# Patient Record
Sex: Female | Born: 1950
Health system: Southern US, Community
[De-identification: ages and names within clinical notes are randomized; demographics above are authoritative.]

## PROBLEM LIST (undated history)

## (undated) DIAGNOSIS — M810 Age-related osteoporosis without current pathological fracture: Secondary | ICD-10-CM

## (undated) DIAGNOSIS — F329 Major depressive disorder, single episode, unspecified: Secondary | ICD-10-CM

## (undated) DIAGNOSIS — E039 Hypothyroidism, unspecified: Secondary | ICD-10-CM

## (undated) DIAGNOSIS — K219 Gastro-esophageal reflux disease without esophagitis: Secondary | ICD-10-CM

## (undated) DIAGNOSIS — G20 Parkinson's disease: Secondary | ICD-10-CM

## (undated) DIAGNOSIS — N96 Recurrent pregnancy loss: Secondary | ICD-10-CM

## (undated) DIAGNOSIS — M8580 Other specified disorders of bone density and structure, unspecified site: Secondary | ICD-10-CM

## (undated) DIAGNOSIS — J45909 Unspecified asthma, uncomplicated: Secondary | ICD-10-CM

## (undated) DIAGNOSIS — D649 Anemia, unspecified: Secondary | ICD-10-CM

## (undated) DIAGNOSIS — N200 Calculus of kidney: Secondary | ICD-10-CM

## (undated) DIAGNOSIS — G239 Degenerative disease of basal ganglia, unspecified: Secondary | ICD-10-CM

## (undated) DIAGNOSIS — M1990 Unspecified osteoarthritis, unspecified site: Secondary | ICD-10-CM

## (undated) DIAGNOSIS — J309 Allergic rhinitis, unspecified: Secondary | ICD-10-CM

## (undated) DIAGNOSIS — G20C Parkinsonism, unspecified: Secondary | ICD-10-CM

## (undated) DIAGNOSIS — I10 Essential (primary) hypertension: Secondary | ICD-10-CM

## (undated) DIAGNOSIS — E559 Vitamin D deficiency, unspecified: Secondary | ICD-10-CM

## (undated) DIAGNOSIS — F32A Depression, unspecified: Secondary | ICD-10-CM

## (undated) DIAGNOSIS — M779 Enthesopathy, unspecified: Secondary | ICD-10-CM

## (undated) DIAGNOSIS — I8290 Acute embolism and thrombosis of unspecified vein: Secondary | ICD-10-CM

## (undated) DIAGNOSIS — E785 Hyperlipidemia, unspecified: Secondary | ICD-10-CM

## (undated) HISTORY — DX: Degenerative disease of basal ganglia, unspecified: G23.9

## (undated) HISTORY — PX: KIDNEY STONE SURGERY: SHX686

## (undated) HISTORY — DX: Allergic rhinitis, unspecified: J30.9

## (undated) HISTORY — DX: Vitamin D deficiency, unspecified: E55.9

## (undated) HISTORY — DX: Age-related osteoporosis without current pathological fracture: M81.0

## (undated) HISTORY — DX: Parkinsonism, unspecified: G20.C

## (undated) HISTORY — DX: Hyperlipidemia, unspecified: E78.5

## (undated) HISTORY — DX: Recurrent pregnancy loss: N96

## (undated) HISTORY — DX: Enthesopathy, unspecified: M77.9

## (undated) HISTORY — DX: Major depressive disorder, single episode, unspecified: F32.9

## (undated) HISTORY — PX: BREAST SURGERY: SHX581

## (undated) HISTORY — DX: Calculus of kidney: N20.0

## (undated) HISTORY — DX: Essential (primary) hypertension: I10

## (undated) HISTORY — DX: Other specified disorders of bone density and structure, unspecified site: M85.80

## (undated) HISTORY — PX: BREAST EXCISIONAL BIOPSY: SUR124

## (undated) HISTORY — DX: Depression, unspecified: F32.A

## (undated) HISTORY — DX: Parkinson's disease: G20

---

## 2005-12-24 ENCOUNTER — Ambulatory Visit: Payer: Self-pay | Admitting: Internal Medicine

## 2006-01-20 ENCOUNTER — Ambulatory Visit: Payer: Self-pay | Admitting: Internal Medicine

## 2006-06-25 ENCOUNTER — Inpatient Hospital Stay (HOSPITAL_COMMUNITY): Admission: RE | Admit: 2006-06-25 | Discharge: 2006-06-29 | Payer: Self-pay | Admitting: Orthopedic Surgery

## 2006-12-24 ENCOUNTER — Emergency Department (HOSPITAL_COMMUNITY): Admission: EM | Admit: 2006-12-24 | Discharge: 2006-12-24 | Payer: Self-pay | Admitting: Emergency Medicine

## 2006-12-26 ENCOUNTER — Ambulatory Visit (HOSPITAL_COMMUNITY): Admission: RE | Admit: 2006-12-26 | Discharge: 2006-12-26 | Payer: Self-pay | Admitting: Internal Medicine

## 2007-05-30 HISTORY — PX: JOINT REPLACEMENT: SHX530

## 2008-03-18 ENCOUNTER — Ambulatory Visit: Payer: Self-pay | Admitting: Internal Medicine

## 2008-03-18 DIAGNOSIS — R195 Other fecal abnormalities: Secondary | ICD-10-CM | POA: Insufficient documentation

## 2008-03-18 DIAGNOSIS — K219 Gastro-esophageal reflux disease without esophagitis: Secondary | ICD-10-CM | POA: Insufficient documentation

## 2008-03-18 DIAGNOSIS — R1319 Other dysphagia: Secondary | ICD-10-CM | POA: Insufficient documentation

## 2008-03-18 DIAGNOSIS — K922 Gastrointestinal hemorrhage, unspecified: Secondary | ICD-10-CM | POA: Insufficient documentation

## 2008-04-13 ENCOUNTER — Ambulatory Visit: Payer: Self-pay | Admitting: Internal Medicine

## 2008-04-19 ENCOUNTER — Telehealth: Payer: Self-pay | Admitting: Internal Medicine

## 2008-05-17 ENCOUNTER — Encounter: Admission: RE | Admit: 2008-05-17 | Discharge: 2008-05-17 | Payer: Self-pay | Admitting: Internal Medicine

## 2008-08-31 ENCOUNTER — Other Ambulatory Visit: Admission: RE | Admit: 2008-08-31 | Discharge: 2008-08-31 | Payer: Self-pay | Admitting: Obstetrics & Gynecology

## 2008-09-20 ENCOUNTER — Encounter: Admission: RE | Admit: 2008-09-20 | Discharge: 2008-09-20 | Payer: Self-pay | Admitting: Obstetrics & Gynecology

## 2009-07-29 HISTORY — PX: OTHER SURGICAL HISTORY: SHX169

## 2009-10-06 ENCOUNTER — Emergency Department (HOSPITAL_COMMUNITY): Admission: EM | Admit: 2009-10-06 | Discharge: 2009-10-06 | Payer: Self-pay | Admitting: Emergency Medicine

## 2009-10-07 ENCOUNTER — Inpatient Hospital Stay (HOSPITAL_COMMUNITY): Admission: EM | Admit: 2009-10-07 | Discharge: 2009-10-08 | Payer: Self-pay | Admitting: Emergency Medicine

## 2009-10-24 ENCOUNTER — Emergency Department (HOSPITAL_COMMUNITY): Admission: EM | Admit: 2009-10-24 | Discharge: 2009-10-25 | Payer: Self-pay | Admitting: Emergency Medicine

## 2009-10-24 ENCOUNTER — Ambulatory Visit (HOSPITAL_COMMUNITY): Admission: RE | Admit: 2009-10-24 | Discharge: 2009-10-24 | Payer: Self-pay | Admitting: Urology

## 2010-07-29 HISTORY — PX: OTHER SURGICAL HISTORY: SHX169

## 2010-10-21 LAB — DIFFERENTIAL
Basophils Absolute: 0 10*3/uL (ref 0.0–0.1)
Eosinophils Absolute: 0.2 10*3/uL (ref 0.0–0.7)
Eosinophils Relative: 2 % (ref 0–5)
Lymphocytes Relative: 20 % (ref 12–46)
Lymphs Abs: 1.9 10*3/uL (ref 0.7–4.0)
Neutrophils Relative %: 69 % (ref 43–77)

## 2010-10-21 LAB — URINALYSIS, ROUTINE W REFLEX MICROSCOPIC
Bilirubin Urine: NEGATIVE
Glucose, UA: NEGATIVE mg/dL
Glucose, UA: NEGATIVE mg/dL
Glucose, UA: NEGATIVE mg/dL
Ketones, ur: 15 mg/dL — AB
Ketones, ur: NEGATIVE mg/dL
Nitrite: NEGATIVE
Protein, ur: 30 mg/dL — AB
Protein, ur: NEGATIVE mg/dL
Protein, ur: NEGATIVE mg/dL
Specific Gravity, Urine: 1.021 (ref 1.005–1.030)
Urobilinogen, UA: 0.2 mg/dL (ref 0.0–1.0)
pH: 6 (ref 5.0–8.0)
pH: 7 (ref 5.0–8.0)

## 2010-10-21 LAB — URINE MICROSCOPIC-ADD ON

## 2010-10-21 LAB — CBC
Platelets: 278 10*3/uL (ref 150–400)
RDW: 14.1 % (ref 11.5–15.5)
WBC: 9.5 10*3/uL (ref 4.0–10.5)

## 2010-10-21 LAB — BASIC METABOLIC PANEL
BUN: 12 mg/dL (ref 6–23)
BUN: 17 mg/dL (ref 6–23)
CO2: 26 mEq/L (ref 19–32)
Calcium: 9.2 mg/dL (ref 8.4–10.5)
Calcium: 9.9 mg/dL (ref 8.4–10.5)
Chloride: 103 mEq/L (ref 96–112)
Creatinine, Ser: 0.92 mg/dL (ref 0.4–1.2)
Creatinine, Ser: 1.15 mg/dL (ref 0.4–1.2)
GFR calc Af Amer: 58 mL/min — ABNORMAL LOW (ref >60)
GFR calc non Af Amer: >60 mL/min (ref >60)
Glucose, Bld: 101 mg/dL — ABNORMAL HIGH (ref 70–99)

## 2010-12-11 NOTE — Assessment & Plan Note (Signed)
[place]                                 ON-CALL NOTE   NAME:[name], Rachel LEZON                      MRN:          [id number]  DATE:04/13/2008                            DOB:          November 02, 1950    Rachel [name] is a 60 year old white female who underwent esophageal  dilatation and upper endoscopy by Dr. Henrene Pastor early today.  She is  complaining of severe pain with swallowing, even without swallowing.  She denies shortness of breath or vomiting.  She has not been able to  drink or eat anything because of the pain.  She came home around 11 or  12 o'clock in the morning.  She has never had esophageal dilatation  before.  She denies any fever.   I will review her Electronic Medical Records to get more detail on her  history.  I have advised the patient to go to emergency room at [facility] to obtain chest x-ray.  [facility] and  have asked emergency room physician to make the initial  assessment of the patient and let me known.     Lowella Bandy. Olevia Perches, MD  Electronically Signed    DMB/MedQ  DD: 04/13/2008  DT: 04/14/2008  Job #: [id number]

## 2010-12-14 NOTE — Op Note (Signed)
NAME:  Rachel [name], Rachel [name]               ACCOUNT NO.:  [id number]   MEDICAL RECORD NO.:  [id number]          PATIENT TYPE:  INP   LOCATION:  0010                         FACILITY:  [facility]   PHYSICIAN:  Gaynelle Arabian, M.D.    DATE OF BIRTH:  23-Oct-1950   DATE OF PROCEDURE:  06/25/2006  DATE OF DISCHARGE:                               OPERATIVE REPORT   PREOPERATIVE DIAGNOSIS:  Failed right knee unicompartmental knee  replacement.   POSTOPERATIVE DIAGNOSIS:  Failed right knee unicompartmental knee  replacement.   PROCEDURE:  Revision of right knee unicompartmental replacement to total  knee arthroplasty.   SURGEON:  Gaynelle Arabian, M.D.   ASSISTANT:  Arlee Muslim.   ANESTHESIA:  General with postop Marcaine pain pump.   ESTIMATED BLOOD LOSS:  Minimal.   DRAINS:  Hemovac times one.   TOURNIQUET TIME:  66 minutes at 300 mmHg.   COMPLICATIONS:  None.   CONDITION:  Stable to recovery.   BRIEF CLINICAL NOTE:  Rachel [name] is a 61 year old female who has [place]  [place].  She has had progressively worsening right greater than left  knee pain.  She has evidence of complete collapse of the  unicompartmental replacement on the right with subsidence and settling  of the tibial component.  She is gone on to significant varus on that  side.  She presents now for conversion to total knee arthroplasty.   PROCEDURE IN DETAIL:  After successful administration of general  anesthetic, the tourniquet is placed high on her right thigh and right  lower extremity prepped and draped in the usual sterile fashion.  Extremity was wrapped in Esmarch, knee flexed, tourniquet inflated 300  mmHg.  Midline incision is made with a medial parapatellar extension  distally to incorporate her old incision.  Skin was cut with a 10 blade  through the subcutaneous tissue to the level of the extensor mechanism.  Fresh blade is used to make a medial  parapatellar arthrotomy.  Soft  tissue over the proximal medial tibia is subperiosteally elevated to the  joint line with the knife into the semimembranosus bursa with a Cobb  elevator.  Soft tissue laterally was elevated with attention being paid  to avoid the patellar tendon on tibial tubercle.  Patella subluxed  laterally.  There is evidence of an definitive loosening of the femoral  component.  I easily removed it prying it out with an osteotome.  The  tibial side it had subsided tremendously.  There was a tremendous amount  of synovitis in the joint.  I removed the ACL and PCL.  We then placed a  starting drill into the distal femur and thoroughly irrigated with the  femoral canal.  The 5 degrees right valgus alignment guide is placed and  referencing off the posterior condyles, rotations marked and the block  pinned to remove 10 mm off the distal femur.  Distal femoral resection  is made with an oscillating saw.   Then subluxed the tibia forward and I removed the menisci.  We placed  the extramedullary  tibial alignment guide referencing proximally at the  medial aspect of tibial tubercle and distally along the second  metatarsal axis and tibial crest.  The block was pinned to remove 10 mm  off the non deficient lateral side.  Resection is made an oscillating  saw.  I then easily removed the tibial component and the tibial  component actually had delaminated in addition to subsiding into the  tibia.  I then placed the cutting block for the augments and took an  additional 5 mm off the medial side to get down to normal bone.  We then  placed a starting drill into the tibia and reamed up to 13 mm so we  could placed the MBT revision tray.  Size 3 was the most appropriate and  I  place a trial MBT revision tray with a 5-mm augment medially.  The  sat down beautifully on the cut bone surfaces.  We then prepared  it  with the keel punch and had also prepared with the modular drill.    The femoral sizing block is then placed, size 3 is most appropriate.  The size 3 cutting block is placed so as to create a rectangular flexion  gap with the knee flexed 90 degrees.  This rotation did correspond to  the epicondylar axis.  The anterior, posterior and chamfer cuts were  subsequently made.   The intercondylar cuts made in the femur.  The size 3 posterior  stabilized femoral trial was placed.  We had the size 3 MBT revision  trial with a 5 mm medial augment in place.  A trial 10-mm insert placed.  We had to go to 12.5.  He had excellent stability to full extension,  varus valgus stressing throughout full range of motion.  The patella was  everted and thickness measured to be 23 mm.  Freehand resection taken to  13 mm.  38 template is placed, lug holes were drilled, trial patella is  placed and it tracks normally.  The osteophytes were then removed with  the posterior femur trial in place.  All trials are removed and we  trialed for the cement restrictor and size 6 was most appropriate.  A  size 6 cement restrictor is placed.  All the cut bone surfaces are  subsequently prepared with pulsatile lavage.  Cement was mixed and once  ready for implantation, and the MBT size 3 revision tray with a 5 mm  medial augment and the 30 mm x 13 stem extension is placed.  It is  impacted and all extruded cement removed.  On the femoral side a size 3  posterior stabilized femur was placed.  12.5 mm trial inserts placed.  Knee held in full extension.  All extruded cement removed.  In addition  the 38 patella was cemented into place and held with the patellar clamp.  Once the cement is fully hardened then the permanent 12.5 mm posterior  stabilized rotating platform insert is placed in the tibial tray.  Wound  was copiously irrigated with saline solution and extensor mechanism closed over Hemovac drain with interrupted #1 PDS.  Flexion against  gravity was 120 degrees.  Tourniquet released with  total time of 66  minutes.  Subcu closed interrupted 2-0 Vicryl subcuticular running 4-0  Monocryl.  The catheter for Marcaine pain pump is placed and the pump is  initiated.  Steri-Strips and bulky sterile dressing are applied and she  is subsequently awakened and transported to recovery in stable  condition.      Gaynelle Arabian, M.D.  Electronically Signed     FA/MEDQ  D:  06/25/2006  T:  06/26/2006  Job:  [id number]

## 2010-12-14 NOTE — Discharge Summary (Signed)
NAME:  Rachel [name], Rachel [name]               ACCOUNT NO.:  [id number]   MEDICAL RECORD NO.:  [id number]          PATIENT TYPE:  INP   LOCATION:  1612                         FACILITY:  [facility]   PHYSICIAN:  Gaynelle Arabian, M.D.    DATE OF BIRTH:  08/12/1950   DATE OF ADMISSION:  06/25/2006  DATE OF DISCHARGE:  06/29/2006                               DISCHARGE SUMMARY   ADMITTING DIAGNOSIS:  1. Failed right unicompartmental knee.  2. Failed left unicompartmental knee.  3. Migraines.  4. Mild depression.  5. Asthma.  6. Bronchitis.  7. Reflux disease.  8. Slight urinary incontinence.  9. Hypothyroidism.  10.History of blood clot left ankle.  11.Borderline osteoporosis.  12.Osteoarthritis.  13.Postmenopausal.   DISCHARGE DIAGNOSIS:  1. Failed right unicompartmental knee replacement, status post      revision right unicompartmental knee with conversion over to right      total knee.  2. Acute blood loss anemia.  3. Failed left unicompartmental knee.  4. Migraines.  5. Mild depression.  6. Asthma.  7. Bronchitis.  8. Reflux disease.  9. Slight urinary incontinence.  10.Hypothyroidism.  11.History of blood clot left ankle.  12.Borderline osteoporosis.  13.Osteoarthritis.  14.Postmenopausal.   PROCEDURE:  June 25, 2006 revision right unicompartmental  replacement over to a total knee.   SURGEON:  Gaynelle Arabian, M.D.   ASSISTANT:  Alexzandrew L. Dara Lords, P.A.   ANESTHESIA:  General, postoperative Marcaine pain pump.   TOURNIQUET TIME:  Sixty-six minutes.   CONSULTS:  None.   BRIEF HISTORY:  Ms. Rachel [name] is a 60 year old female who [place]  [place]. She has  had progressive worsening right greater than left knee pain. She has  evidence of collapse in the unicompartmental on the right with  sustenance and setting of the tibial component. She has gone into a  significant varus on that side and now presents to  conversion over.   LABORATORY DATA:  Preop CBC: Hemoglobin was 13.7, hematocrit 40.5, white  cell count 6.5. Postop hemoglobin 11. Last H and H 9.1 and 27.0. PT, PTT  13.9 and 32 respectively on admission, INR 1.1. Serial pro times  followed. Last PT and INR are 23.1 and 2.0. Chem panel on admission all  within normal limits. Preop UA: Small leukocyte esterase, rare bacteria,  rare epithelial cells, otherwise negative, oxalate crystals noted. Blood  group type O+.   EKG June 18, 2006 normal sinus rhythm, septal infarct age  undetermined, abnormal, no previous tracings, confirmed by Dr. Jenkins Rouge. A 2-view chest June 18, 2006: Minimal chronic bronchitic  changes, no acute abnormality. Portable chest June 27, 2006: Stable  changes of COPD, streaky areas of atelectasis but no effusion, edema or  infiltrates.   HISTORY:  The patient was admitted to [facility], tolerated  her procedure well and later transferred to the recovery room and the  orthopedic floor, started on PCA and p.o. analgesics for pain control  following surgery, given Coumadin for DVT prophylaxis, given 24 hours  postop IV antibiotics. Did well on the night of  surgery and also on the  morning of day#1. Started getting up with therapy. Did have a little  over sedation the night of surgery and had to use Narcan. The PCA was  stopped but doing better the next morning. Hemovac drain was pulled.  Started getting up with therapy. Had decent output urinary standpoint.  Started to wean off the O2. By day #2 she started to get up, feeling a  little better. Denied any real significant pain in the knee. Was trying  to wean off her O2. Her saturations kept dropping a little bit. She did  have a history of asthma and bronchitis. Chest x-ray was checked as  above. No labored breathing. Continued to get with therapy. By day #2  she was up ambulating approximately 80 feet and then later 200 feet.  Doing well by  day #2, progressing and by day #3 ambulating over 200  feet, progressing well, was ready to go home by June 29, 2006.   DISCHARGE PLAN:  1. Patient was discharged home on June 29, 2006.  2. Discharge diagnosis: Please see above.  3. Discharge medications: Coumadin, Percocet, Robaxin, Nu-Iron.   DIET:  As tolerated.   ACTIVITY:  Weightbearing as tolerated. Home with PT. Home with nursing.  Total knee protocol. Follow-up 2 weeks.   DISPOSITION:  Home.   CONDITION UPON DISCHARGE:  Improved.      Alexzandrew L. Dara Lords, P.A.      Gaynelle Arabian, M.D.  Electronically Signed    ALP/MEDQ  D:  07/23/2006  T:  07/23/2006  Job:  [id number]   cc:   Dr. Lang Snow

## 2010-12-14 NOTE — H&P (Signed)
NAME:  Rachel [name], Rachel [name]               ACCOUNT NO.:  [id number]   MEDICAL RECORD NO.:  [id number]          PATIENT TYPE:  INP   LOCATION:  NA                           [facility]ACILITY:  [facility]   PHYSICIAN:  Gaynelle Arabian, M.D.    DATE OF BIRTH:  1950-08-29   DATE OF ADMISSION:  06/25/2006  DATE OF DISCHARGE:                              HISTORY & PHYSICAL   DATE OF OFFICE VISIT HISTORY AND PHYSICAL:  June 17, 2006   CHIEF COMPLAINT:  Failed unicompartmental right knee.   HISTORY OF PRESENT ILLNESS:  The patient is a 60 year old female who has  been seen by Dr. Wynelle Link in second opinion for ongoing knee pain.  She  has had bilateral knee replacements for some time now; she underwent  bilateral simultaneous unicompartmental replacements done in [place] in  2002.  She did fairly well for about 6 months, then began to have pain.  She has progressively gotten worse.  She was recently seen by Dr. Nedra Hai, who felt that she would need revision surgery.  She came in  after being referred over by some friends.  She was seen in second  opinion by Dr. Wynelle Link and found to have bilateral unicompartmental knee  replacements.  Unfortunately, she has gone on to significant collapse of  both the femoral and tibial components on the right side and she has  gone into about 5 degrees of varus.  She also has a fair amount of bone  loss.  On the left side, there is some evidence of collapse , but not as  bad.  She has not developed significant angular deformity yet on the  left.  Due to the significant findings, it is felt she would benefit  from undergoing revision surgery, but due to the risks and the increased  surgery, it was felt she would only need to have one at time.  Risks and  benefits of the procedure have been discussed with the patient, who has  elected to proceed with surgery.   ALLERGIES:  No known drug allergies.   CURRENT MEDICATIONS:  Celexa, Vytorin, Singulair, Actonel,  levothyroxine, glucosamine, iron, calcium, vitamin E and a Qvar inhaler.   PAST MEDICAL HISTORY:  1. History of headaches.  2. Mild depression.  3. Asthma.  4. Bronchitis.  5. Reflux disease.  6. Mild urinary incontinence.  7. Hypothyroidism.  8. History of blood clot, left ankle.  9. Borderline osteoporosis.  10.Osteoarthritis.  11.Postmenopausal.   PAST SURGICAL HISTORY:  1. Right knee arthroscopy in 2002.  2. Bilateral unicompartmental replacement surgeries, 2002.   SOCIAL HISTORY:  Married, works as a Research scientist (physical sciences), nonsmoker, no  alcohol, 1 adopted child.   FAMILY HISTORY:  Mother with history of heart disease, diabetes and  arthritis.  Father with a history of stroke and sister with a history of  breast cancer.   REVIEW OF SYSTEMS:  GENERAL:  No fevers, chills or night sweats.  NEUROLOGIC:  No seizures, syncope or paralysis.  RESPIRATORY:  She does  have a little bit of shortness of breath with her asthma, bronchitis and  intermittent cough,  nonproductive.  No hemoptysis.  CARDIOVASCULAR:  No  chest pain, angina or orthopnea.  GI:  No nausea, vomiting, diarrhea or  constipation.  GU:  Some slight urinary incontinence.  No dysuria or  hematuria.  MUSCULOSKELETAL:  Right knee.   PHYSICAL EXAMINATION:  VITAL SIGNS:  Pulse 60, respirations 12, blood  pressure 120/78.  GENERAL:  A 60 year old female, overweight, obese, with hip and thigh  obesity, well-nourished, well-developed, alert, oriented and  cooperative, pleasant.  HEENT:  Normocephalic, atraumatic.  Pupils are round and reactive.  Does  not wear glasses.  EOMs intact.  NECK:  Supple.  LUNGS:  Clear, anterior and posterior chest walls.  HEART:  Regular rate and rhythm.  No murmur.  ABDOMEN:  Soft, round, protuberant abdomen.  Bowel sounds are present.  RECTAL, BREASTS AND GENITALIA:  Not done and not pertinent to present  illness.  EXTREMITIES:  Right knee:  The right knee shows a slight varus  malalignment  deformity, range of motion of 0-130 degrees, no  instability.  Left knee:  Similar motion of 0-130 degrees, no effusion,  no instability.  Crepitus bilaterally noted.   IMPRESSION:  1. Failed right unicompartmental knee.  2. Failed left unicompartmental knee.  3. Migraines.  4. Mild depression.  5. Asthma.  6. Bronchitis.  7. Reflux disease.  8. Slight urinary incontinence.  9. Hypothyroidism.  10.History of a blood clot in left ankle.  11.Borderline osteoporosis.  12.Osteoarthritis.  13.Postmenopausal.   PLAN:  The patient was admitted to [facility] to undergo a  conversion of the right unicompartmental knee over to a right total knee  replacement arthroplasty.  Surgery will be performed by Dr. Gaynelle Arabian.  Her medical physician is Dr. Lang Snow.  Dr. Brigitte Pulse will be  notified of the room number and admission, and will be consulted if  needed for medical assistance with this patient throughout the hospital  course.      Alexzandrew L. Dara Lords, P.A.      Gaynelle Arabian, M.D.  Electronically Signed    ALP/MEDQ  D:  06/24/2006  T:  06/25/2006  Job:  [id number]   cc:   Janalyn Rouse, M.D.  Fax: [phone]

## 2011-08-12 ENCOUNTER — Other Ambulatory Visit (HOSPITAL_COMMUNITY): Payer: Self-pay | Admitting: Internal Medicine

## 2011-08-12 ENCOUNTER — Ambulatory Visit (HOSPITAL_COMMUNITY)
Admission: RE | Admit: 2011-08-12 | Discharge: 2011-08-12 | Disposition: A | Discharge status: Home / Self Care | Payer: BC Managed Care – PPO | Source: Ambulatory Visit | Attending: Internal Medicine | Admitting: Internal Medicine

## 2011-08-12 DIAGNOSIS — N200 Calculus of kidney: Secondary | ICD-10-CM | POA: Insufficient documentation

## 2011-08-12 DIAGNOSIS — N281 Cyst of kidney, acquired: Secondary | ICD-10-CM | POA: Insufficient documentation

## 2011-08-12 DIAGNOSIS — R52 Pain, unspecified: Secondary | ICD-10-CM

## 2011-08-12 DIAGNOSIS — R109 Unspecified abdominal pain: Secondary | ICD-10-CM | POA: Insufficient documentation

## 2011-11-22 ENCOUNTER — Other Ambulatory Visit: Payer: Self-pay | Admitting: Internal Medicine

## 2011-11-22 DIAGNOSIS — Z1231 Encounter for screening mammogram for malignant neoplasm of breast: Secondary | ICD-10-CM

## 2011-12-04 ENCOUNTER — Ambulatory Visit
Admission: RE | Admit: 2011-12-04 | Discharge: 2011-12-04 | Disposition: A | Discharge status: Home / Self Care | Payer: BC Managed Care – PPO | Source: Ambulatory Visit | Attending: Internal Medicine | Admitting: Internal Medicine

## 2011-12-04 DIAGNOSIS — Z1231 Encounter for screening mammogram for malignant neoplasm of breast: Secondary | ICD-10-CM

## 2011-12-04 LAB — HM MAMMOGRAPHY

## 2011-12-10 ENCOUNTER — Other Ambulatory Visit: Payer: Self-pay | Admitting: Orthopedic Surgery

## 2011-12-10 NOTE — Progress Notes (Signed)
Preoperative surgical orders have been place into the Epic hospital system for Yolanda Bonine on 12/10/2011, 5:35 PM  by Mickel Crow for surgery on 06/03/2012.  Preop Total Knee orders including Bupivacaine On-Q pump, IV Tylenol, and IV Decadron as long as there are no contraindications to the above medications.

## 2012-01-03 LAB — HM PAP SMEAR: HM Pap smear: NEGATIVE

## 2012-05-13 ENCOUNTER — Ambulatory Visit (HOSPITAL_COMMUNITY): Payer: BC Managed Care – PPO | Attending: Internal Medicine | Admitting: Radiology

## 2012-05-13 VITALS — BP 153/85 | HR 53 | Ht 66.0 in | Wt 236.0 lb

## 2012-05-13 DIAGNOSIS — I10 Essential (primary) hypertension: Secondary | ICD-10-CM | POA: Insufficient documentation

## 2012-05-13 DIAGNOSIS — R079 Chest pain, unspecified: Secondary | ICD-10-CM

## 2012-05-13 DIAGNOSIS — R0789 Other chest pain: Secondary | ICD-10-CM

## 2012-05-13 DIAGNOSIS — R0602 Shortness of breath: Secondary | ICD-10-CM

## 2012-05-13 MED ORDER — TECHNETIUM TC 99M SESTAMIBI GENERIC - CARDIOLITE
33.0000 | Freq: Once | INTRAVENOUS | Status: AC | PRN
  Administered 2012-05-13: 33 via INTRAVENOUS

## 2012-05-13 MED ORDER — TECHNETIUM TC 99M SESTAMIBI GENERIC - CARDIOLITE
11.0000 | Freq: Once | INTRAVENOUS | Status: AC | PRN
  Administered 2012-05-13: 11 via INTRAVENOUS

## 2012-05-13 MED ORDER — REGADENOSON 0.4 MG/5ML IV SOLN
0.4000 mg | Freq: Once | INTRAVENOUS | Status: AC
  Administered 2012-05-13: 0.4 mg via INTRAVENOUS

## 2012-05-13 NOTE — Progress Notes (Signed)
[facility] [place] [facility] NUCLEAR MED [id number] [place] [address] [phone]  Cardiology Nuclear Med Study  Rachel [name] is a 61 y.o. female     MRN : [id number]     DOB: 1951/07/15  Procedure Date: 05/13/2012  Nuclear Med Background Indication for Stress Test:  Evaluation for Ischemia, Pending Surgical Clearance for (L) TKR and Abnormal EKG on 06/03/12. History:  No previous documented CAD. Cardiac Risk Factors: Hypertension, Lipids and Obesity  Symptoms:  Chest Pain.(b) Arms/Neck (last episode of chest discomfort is now, 1/10.)   Nuclear Pre-Procedure Caffeine/Decaff Intake:  None NPO After: 7:00am   Lungs:  Clear. O2 Sat: 94% on room air. IV 0.9% NS with Angio Cath:  22g  IV Site: R Antecubital  IV Started by:  Crissie Figures, RN  Chest Size (in):  38 Cup Size: B  Height: 5\' 6"  (1.676 m)  Weight:  236 lb (107.049 kg)  BMI:  Body mass index is 38.09 kg/(m^2). Tech Comments:  N/A    Nuclear Med Study 1 or 2 day study: 1 day  Stress Test Type:  Lexiscan  Reading MD: Darlin Coco, MD  Order Authorizing Provider:  Lutricia Feil, MD  Resting Radionuclide: Technetium 56m Sestamibi  Resting Radionuclide Dose: 11.0 mCi   Stress Radionuclide:  Technetium 61m Sestamibi  Stress Radionuclide Dose: 33.0 mCi           Stress Protocol Rest HR: 53 Stress HR: 85  Rest BP: 153/85 Stress BP: 153/91  Exercise Time (min): n/a METS: n/a   Predicted Max HR: 158 bpm % Max HR: 53.8 bpm Rate Pressure Product: 13005   Dose of Adenosine (mg):  n/a Dose of Lexiscan: 0.4 mg  Dose of Atropine (mg): n/a Dose of Dobutamine: n/a mcg/kg/min (at max HR)  Stress Test Technologist: Letta Moynahan, CMA-N  Nuclear Technologist:  Charlton Amor, CNMT     Rest Procedure:  Myocardial perfusion imaging was performed at rest 45 minutes following the intravenous administration of Technetium 68m Sestamibi.  Rest ECG: Probable prior AWMI.  Stress Procedure:  The  patient received IV Lexiscan 0.4 mg over 15-seconds.  Technetium 86m Tetrofosmin injected at 30-seconds.  There were nonspecific T-wave changes and rare PAC's with Lexiscan.  Quantitative spect images were obtained after a 45 minute delay.  Stress ECG: No significant change from baseline ECG  QPS Raw Data Images:  Normal; no motion artifact; normal heart/lung ratio. Stress Images:  Normal homogeneous uptake in all areas of the myocardium. Rest Images:  Normal homogeneous uptake in all areas of the myocardium. Subtraction (SDS):  No evidence of ischemia. Transient Ischemic Dilatation (Normal <1.22):  1.11 Lung/Heart Ratio (Normal <0.45):  0.32  Quantitative Gated Spect Images QGS EDV:  117 ml QGS ESV:  46 ml  Impression Exercise Capacity:  Lexiscan with no exercise. BP Response:  Normal blood pressure response. Clinical Symptoms:  No significant symptoms noted. ECG Impression:  No significant ST segment change suggestive of ischemia. Comparison with Prior Nuclear Study: No previous nuclear study performed  Overall Impression:  Normal stress nuclear study.  LV Ejection Fraction: 61%.  LV Wall Motion:  NL LV Function; NL Wall Motion  [employer]

## 2012-05-26 ENCOUNTER — Other Ambulatory Visit: Payer: Self-pay | Admitting: Orthopedic Surgery

## 2012-05-26 ENCOUNTER — Encounter (HOSPITAL_COMMUNITY): Payer: Self-pay | Admitting: Pharmacy Technician

## 2012-05-26 MED ORDER — BUPIVACAINE 0.25 % ON-Q PUMP SINGLE CATH 300ML: 300.0000 mL | INJECTION | Status: DC

## 2012-05-26 MED ORDER — DEXAMETHASONE SODIUM PHOSPHATE 10 MG/ML IJ SOLN: 10.0000 mg | Freq: Once | INTRAMUSCULAR | Status: DC

## 2012-05-26 NOTE — Progress Notes (Signed)
Preoperative surgical orders have been place into the Epic hospital system for Rachel [name] on 05/26/2012, 5:37 PM  by Mickel Crow for surgery on 06/03/2012.  Preop Total Knee orders including Bupivacaine On-Q pump, IV Tylenol, and IV Decadron as long as there are no contraindications to the above medications. Arlee Muslim, PA-C These orders were originally entered on 12/10/2011 but due to a default 90 day rule, the orders were deleted automatically by the EPIC system requiring them to be reentered again. Arlee Muslim, PA-C

## 2012-05-29 NOTE — Patient Instructions (Addendum)
[place]  05/29/2012   Your procedure is scheduled on:  06-03-2012  Report to [place] at [place] AM    Call this number if you have problems the morning of surgery: [phone]   Remember: bring envelope from dr Wynelle Link   Do not eat food or drink liquids:After Midnight.  .  Take these medicines the morning of surgery with A SIP OF WATER: celexa, prilosec, pulmicort, proairinhaler if needed and bring inhaler.    Do not wear jewelry or make up.  Do not wear lotions, powders, or perfumes. You may wear deodorant.    Do not bring valuables to the hospital.  Contacts, dentures or bridgework may not be worn into surgery.  Leave suitcase in the car. After surgery it may be brought to your room.  For patients admitted to the hospital, checkout time is 11:00 AM the day of discharge                             Patients discharged the day of surgery will not be allowed to drive home. If going home same day of surgery, you must have someone stay with you the first 24 hours at home and arrange for some one to drive you home from hospital.    Special Instructions: See [facility] Preparing for Surgery instruction sheet. Women do not shave legs or underarms for 12 hours before showers. Men may shave face morning of surgery.    Please read over the following fact sheets that you were given: MRSA Information, blood fact sheet  [place] pre op nurse phone number [phone], call if needed

## 2012-05-29 NOTE — Progress Notes (Signed)
STRESS TEST WITH RESTING EKG 05-13-2012 EPIC MEDICAL CLEARN[facility] NOTE DR Brigitte Pulse ON CHART

## 2012-06-01 ENCOUNTER — Encounter (HOSPITAL_COMMUNITY)
Admission: RE | Admit: 2012-06-01 | Discharge: 2012-06-01 | Disposition: A | Discharge status: Home / Self Care | Payer: BC Managed Care – PPO | Source: Ambulatory Visit | Attending: Orthopedic Surgery | Admitting: Orthopedic Surgery

## 2012-06-01 ENCOUNTER — Other Ambulatory Visit: Payer: Self-pay | Admitting: Orthopedic Surgery

## 2012-06-01 ENCOUNTER — Encounter (HOSPITAL_COMMUNITY): Payer: Self-pay

## 2012-06-01 ENCOUNTER — Ambulatory Visit (HOSPITAL_COMMUNITY)
Admission: RE | Admit: 2012-06-01 | Discharge: 2012-06-01 | Disposition: A | Discharge status: Home / Self Care | Payer: BC Managed Care – PPO | Source: Ambulatory Visit | Attending: Orthopedic Surgery | Admitting: Orthopedic Surgery

## 2012-06-01 DIAGNOSIS — Z01818 Encounter for other preprocedural examination: Secondary | ICD-10-CM | POA: Insufficient documentation

## 2012-06-01 HISTORY — DX: Hypothyroidism, unspecified: E03.9

## 2012-06-01 HISTORY — DX: Gastro-esophageal reflux disease without esophagitis: K21.9

## 2012-06-01 HISTORY — DX: Anemia, unspecified: D64.9

## 2012-06-01 HISTORY — DX: Unspecified osteoarthritis, unspecified site: M19.90

## 2012-06-01 HISTORY — DX: Acute embolism and thrombosis of unspecified vein: I82.90

## 2012-06-01 HISTORY — DX: Unspecified asthma, uncomplicated: J45.909

## 2012-06-01 LAB — PROTIME-INR: Prothrombin Time: 14.3 seconds (ref 11.6–15.2)

## 2012-06-01 LAB — URINALYSIS, ROUTINE W REFLEX MICROSCOPIC
Bilirubin Urine: NEGATIVE
Glucose, UA: NEGATIVE mg/dL
Hgb urine dipstick: NEGATIVE
Specific Gravity, Urine: 1.011 (ref 1.005–1.030)
pH: 6.5 (ref 5.0–8.0)

## 2012-06-01 LAB — SURGICAL PCR SCREEN
MRSA, PCR: NEGATIVE
Staphylococcus aureus: NEGATIVE

## 2012-06-01 LAB — COMPREHENSIVE METABOLIC PANEL
Alkaline Phosphatase: 87 U/L (ref 39–117)
BUN: 17 mg/dL (ref 6–23)
CO2: 33 mEq/L — ABNORMAL HIGH (ref 19–32)
Chloride: 100 mEq/L (ref 96–112)
GFR calc Af Amer: >90 mL/min (ref >90)
GFR calc non Af Amer: >90 mL/min (ref >90)
Glucose, Bld: 81 mg/dL (ref 70–99)
Potassium: 3.3 mEq/L — ABNORMAL LOW (ref 3.5–5.1)
Total Bilirubin: 0.5 mg/dL (ref 0.3–1.2)
Total Protein: 6.8 g/dL (ref 6.0–8.3)

## 2012-06-01 LAB — CBC
HCT: 42.2 % (ref 36.0–46.0)
Hemoglobin: 14.2 g/dL (ref 12.0–15.0)
MCHC: 33.6 g/dL (ref 30.0–36.0)

## 2012-06-01 LAB — APTT: aPTT: 34 seconds (ref 24–37)

## 2012-06-01 NOTE — H&P (Signed)
Rachel [name]. [facility]  DOB: 1951/04/28 Married / Language: English / Race: White Female  Date of Admission:  06/03/12  Chief Complaint:  Left Knee Pain  History of Present Illness The patient is a 61 year old female who comes in for a preoperative History and Physical. The patient is scheduled for a left conversion of uni knee to total knee replacement to be performed by Dr. Dione Plover. Aluisio, MD at [facility] on 06/03/2012. The patient is being followed for their left knee pain and osteoarthritis. Symptoms reported today include: pain, swelling and catching. The patient feels that they are doing poorly and report their pain level to be mild to moderate. The following medication has been used for pain control: Hydrocodone (prn). Rachel [name] had a right knee conversion of a uni-knee to total knee about 4-5 years ago and did great with that. The left knee is getting progressively worse. She has a known loose uni-compartment replacement. She feels as though the pain has gotten progressively worse and worse. She wants to get this fixed. They have been treated conservatively in the past for the above stated problem and despite conservative measures, they continue to have progressive pain and severe functional limitations and dysfunction. They have failed non-operative management including home exercise, medications. It is felt that they would benefit from undergoing conversion of uni to total joint replacement. Risks and benefits of the procedure have been discussed with the patient and they elect to proceed with surgery. There are no active contraindications to surgery such as ongoing infection or rapidly progressive neurological disease.  Please note that at the time of her H&P, she described an epsiode of chest pain and discomfort that started on Sunday 05/10/12 after dinner and continued until bedtime. She stated that the pain was in the upper anterior chest and did have radiation of pain into  both the left and right arms and into the neck. The pain became quite severe at one point at which she thought she would have to go to the ED but it did subside eventually. It had resolved by the next morning, but reoccurred once she got out of her shower and continued until about 1 or 2 in the afternoon on Monday. These symtpoms were called over to Dr. Raul Del office and an appointment was arranged over at his office later today, 05/12/12.   Allergies Symbicort   Family History Cancer. sister Cerebrovascular Accident. father Congestive Heart Failure. mother and sister Heart disease in female family member before age 84 Hypertension. mother Osteoarthritis. mother and sister   Social History Alcohol use. current drinker; drinks wine; only occasionally per week current drinker; only occasionally per week Children. 1 Drug/Alcohol Rehab (Currently). no Copy of Drug/Alcohol Rehab (Previously). no Exercise. Exercises daily; does running / walking Illicit drug use. no Living situation. live with spouse Marital status. married Number of flights of stairs before winded. less than 1 Pain Contract. no Tobacco / smoke exposure. yes outdoors only no Tobacco use. never smoker   Medication History CeleXA ( Oral) Specific dose unknown - Active. Naproxen Sodium (500MG  Tablet ER, Oral) Active. Pulmicort Flexhaler (90MCG/ACT Aero Pow Br Act, Inhalation) Active. Synthroid ( Oral) Specific dose unknown - Active. PriLOSEC ( Oral) Specific dose unknown - Active. ProAir HFA (108 (90 Base)MCG/ACT Aerosol Soln, Inhalation) Active. Iron Combinations ( Oral) Active.   Past Surgical History Arthroscopy of Knee. bilateral Total Knee Replacement. right 06/25/2006 by Dr Wynelle Link Kidney Rachel [name] Extraction. Date: 2010. Colonoscopy   Past Medical History Asthma  Blood Clot. Presumed DVT - age 50 - hospitalized for two weeks for treatment with heparin, then coumadin for  several months. Gastroesophageal Reflux Disease Hypercholesterolemia Hypothyroidism Kidney Stone Osteoarthritis Vertigo. Past History   Review of Systems General:Present- Fatigue and Feeling sick. Not Present- Chills, Fever, Night Sweats, Appetite Loss, Weight Gain and Weight Loss. Skin:Not Present- Itching, Rash, Skin Color Changes, Ulcer, Psoriasis and Change in Hair or Nails. HEENT:Present- Hearing problems and Ringing in the Ears. Not Present- Sensitivity to light and Nose Bleed. Respiratory:Present- Snoring and Dyspnea. Not Present- Chronic Cough and Bloody sputum. Cardiovascular:Present- Chest Pain and Swelling of Extremities. Not Present- Shortness of Breath, Leg Cramps and Palpitations. Gastrointestinal:Present- Heartburn. Not Present- Bloody Stool, Abdominal Pain, Vomiting, Nausea and Incontinence of Stool. Female Genitourinary:Not Present- Blood in Urine, Menstrual Irregularities, Frequency, Incontinence and Nocturia. Musculoskeletal:Present- Muscle Weakness, Joint Stiffness, Joint Swelling, Joint Pain and Back Pain. Not Present- Muscle Pain. Neurological:Present- Headaches. Not Present- Tingling, Numbness, Burning, Tremor and Dizziness. Psychiatric:Not Present- Anxiety, Depression and Memory Loss. Endocrine:Present- Heat Intolerance. Not Present- Cold Intolerance, Excessive hunger and Excessive Thirst.   Vitals Weight: 234 lb Height: 65.5 in Body Surface Area: 2.21 m Body Mass Index: 38.35 kg/m Pulse: 62 (Regular) Resp.: 12 (Unlabored) BP: 124/72 (Sitting, Left Arm, Standard)   Physical Exam The physical exam findings are as follows:  Note: Patient is a 61 year old female with continued knee pain. Patient is accompanied today by her husband.   General Mental Status - Alert, cooperative and good historian. General Appearance- pleasant. Not in acute distress. Orientation- Oriented X3. Build & Nutrition- Well nourished and Well  developed.   Head and Neck Head- normocephalic, atraumatic . Neck Global Assessment- supple. no bruit auscultated on the right and no bruit auscultated on the left.   Eye Pupil- Bilateral- Regular and Round. Motion- Bilateral- EOMI.  wears glasses  Chest and Lung Exam Auscultation: Breath sounds:- clear at anterior chest wall and - clear at posterior chest wall. Adventitious sounds:- No Adventitious sounds.  No wheezes noted today  Cardiovascular Auscultation:Rhythm- Regular rate and rhythm. Heart Sounds- S1 WNL and S2 WNL. Murmurs & Other Heart Sounds:Auscultation of the heart reveals - No Murmurs.   Abdomen Inspection:Contour- Generalized moderate distention. Palpation/Percussion:Tenderness- Abdomen is non-tender to palpation. Rigidity (guarding)- Abdomen is soft. Auscultation:Auscultation of the abdomen reveals - Bowel sounds normal.   Female Genitourinary  Not done, not pertinent to present illness  Musculoskeletal Her left knee shows a slight varus. Range is 5-120. There is moderate crepitus on range of motion. She is tender lateral and medial. No instability is noted.  RADIOGRAPHS: AP of both knees and lateral demonstrate the prosthesis on the right is in excellent position. There is no periprosthetic abnormalities. The left shows the uni-compartmental replacement has collapsed and she has tricompartmental bone on bone changes.  Assessment & Plan Osteoarthritis, knee (715.96) Impression: Left Knee Uni  Note: Patient is for a conversion left uni knee to a left total knee replacement by Dr. Wynelle Link.  Plan is to go home.  Please note that at the time of her H&P, she described an epsiode of chest pain and discomfort that started on Sunday 05/10/12 after dinner and continued until bedtime. She stated that the pain was in the upper anterior chest and did have radiation of pain into both the left and right arms and into the neck. The pain  became quite severe at one point at which she thought she would have to go to the ED but it did subside  eventually. It had resolved by the next morning, but reoccurred once she got out of her shower and continued until about 1 or 2 in the afternoon on Monday. These symtpoms were called over to Dr. Raul Del office and an appointment was arranged over at his office later today, 05/12/12.  Signed electronically by Jeneen Montgomery, PA-C

## 2012-06-03 ENCOUNTER — Encounter (HOSPITAL_COMMUNITY)
Admission: RE | Disposition: A | Discharge status: Home Health | Payer: Self-pay | Source: Ambulatory Visit | Attending: Orthopedic Surgery

## 2012-06-03 ENCOUNTER — Inpatient Hospital Stay (HOSPITAL_COMMUNITY): Payer: BC Managed Care – PPO | Admitting: Anesthesiology

## 2012-06-03 ENCOUNTER — Encounter (HOSPITAL_COMMUNITY): Payer: Self-pay | Admitting: Anesthesiology

## 2012-06-03 ENCOUNTER — Encounter (HOSPITAL_COMMUNITY): Payer: Self-pay | Admitting: *Deleted

## 2012-06-03 DIAGNOSIS — Z96659 Presence of unspecified artificial knee joint: Secondary | ICD-10-CM

## 2012-06-03 DIAGNOSIS — T84099A Other mechanical complication of unspecified internal joint prosthesis, initial encounter: Principal | ICD-10-CM | POA: Diagnosis present

## 2012-06-03 DIAGNOSIS — K219 Gastro-esophageal reflux disease without esophagitis: Secondary | ICD-10-CM | POA: Diagnosis present

## 2012-06-03 DIAGNOSIS — Y831 Surgical operation with implant of artificial internal device as the cause of abnormal reaction of the patient, or of later complication, without mention of misadventure at the time of the procedure: Secondary | ICD-10-CM | POA: Diagnosis present

## 2012-06-03 DIAGNOSIS — T8484XA Pain due to internal orthopedic prosthetic devices, implants and grafts, initial encounter: Secondary | ICD-10-CM

## 2012-06-03 DIAGNOSIS — E039 Hypothyroidism, unspecified: Secondary | ICD-10-CM | POA: Diagnosis present

## 2012-06-03 DIAGNOSIS — D62 Acute posthemorrhagic anemia: Secondary | ICD-10-CM

## 2012-06-03 DIAGNOSIS — E876 Hypokalemia: Secondary | ICD-10-CM

## 2012-06-03 HISTORY — PX: TOTAL KNEE REVISION: SHX996

## 2012-06-03 SURGERY — TOTAL KNEE REVISION
Anesthesia: Spinal | Site: Knee | Laterality: Left | Wound class: Clean

## 2012-06-03 MED ORDER — FENTANYL CITRATE 0.05 MG/ML IJ SOLN
INTRAMUSCULAR | Status: DC | PRN
  Administered 2012-06-03: 50 ug via INTRAVENOUS

## 2012-06-03 MED ORDER — OXYCODONE HCL 5 MG PO TABS
5.0000 mg | ORAL_TABLET | ORAL | Status: DC | PRN
  Administered 2012-06-03 – 2012-06-06 (×13): 10 mg via ORAL
  Filled 2012-06-03 (×14): qty 2

## 2012-06-03 MED ORDER — FLEET ENEMA 7-19 GM/118ML RE ENEM: 1.0000 | ENEMA | Freq: Once | RECTAL | Status: AC | PRN

## 2012-06-03 MED ORDER — LACTATED RINGERS IV SOLN: INTRAVENOUS | Status: DC

## 2012-06-03 MED ORDER — LACTATED RINGERS IV SOLN
INTRAVENOUS | Status: DC | PRN
  Administered 2012-06-03 (×3): via INTRAVENOUS

## 2012-06-03 MED ORDER — BUPIVACAINE ON-Q PAIN PUMP (FOR ORDER SET NO CHG)
INJECTION | Status: AC
  Filled 2012-06-03: qty 1

## 2012-06-03 MED ORDER — MEPERIDINE HCL 50 MG/ML IJ SOLN: 6.2500 mg | INTRAMUSCULAR | Status: DC | PRN

## 2012-06-03 MED ORDER — ONDANSETRON HCL 4 MG/2ML IJ SOLN
4.0000 mg | Freq: Four times a day (QID) | INTRAMUSCULAR | Status: DC | PRN
  Administered 2012-06-03 – 2012-06-04 (×2): 4 mg via INTRAVENOUS
  Filled 2012-06-03 (×2): qty 2

## 2012-06-03 MED ORDER — METOCLOPRAMIDE HCL 5 MG/ML IJ SOLN
5.0000 mg | Freq: Three times a day (TID) | INTRAMUSCULAR | Status: DC | PRN
  Administered 2012-06-03: 10 mg via INTRAVENOUS
  Filled 2012-06-03: qty 2

## 2012-06-03 MED ORDER — LEVOTHYROXINE SODIUM 75 MCG PO TABS
75.0000 ug | ORAL_TABLET | Freq: Every evening | ORAL | Status: DC
  Administered 2012-06-04 – 2012-06-05 (×2): 75 ug via ORAL
  Filled 2012-06-03 (×4): qty 1

## 2012-06-03 MED ORDER — PROPOFOL 10 MG/ML IV BOLUS
INTRAVENOUS | Status: DC | PRN
  Administered 2012-06-03 (×2): 20 mg via INTRAVENOUS

## 2012-06-03 MED ORDER — PHENOL 1.4 % MT LIQD: 1.0000 | OROMUCOSAL | Status: DC | PRN

## 2012-06-03 MED ORDER — ACETAMINOPHEN 650 MG RE SUPP: 650.0000 mg | Freq: Four times a day (QID) | RECTAL | Status: DC | PRN

## 2012-06-03 MED ORDER — FLUTICASONE PROPIONATE HFA 44 MCG/ACT IN AERO
2.0000 | INHALATION_SPRAY | Freq: Two times a day (BID) | RESPIRATORY_TRACT | Status: DC
  Administered 2012-06-03 – 2012-06-04 (×2): 2 via RESPIRATORY_TRACT
  Filled 2012-06-03: qty 10.6

## 2012-06-03 MED ORDER — MORPHINE SULFATE 2 MG/ML IJ SOLN
1.0000 mg | INTRAMUSCULAR | Status: DC | PRN
  Administered 2012-06-03 (×3): 1 mg via INTRAVENOUS
  Filled 2012-06-03 (×2): qty 1

## 2012-06-03 MED ORDER — METHOCARBAMOL 100 MG/ML IJ SOLN
500.0000 mg | Freq: Four times a day (QID) | INTRAVENOUS | Status: DC | PRN
  Administered 2012-06-03: 500 mg via INTRAVENOUS
  Filled 2012-06-03: qty 5

## 2012-06-03 MED ORDER — METOCLOPRAMIDE HCL 10 MG PO TABS: 5.0000 mg | ORAL_TABLET | Freq: Three times a day (TID) | ORAL | Status: DC | PRN

## 2012-06-03 MED ORDER — CEFAZOLIN SODIUM-DEXTROSE 2-3 GM-% IV SOLR
2.0000 g | INTRAVENOUS | Status: AC
  Administered 2012-06-03: 2 g via INTRAVENOUS

## 2012-06-03 MED ORDER — ACETAMINOPHEN 10 MG/ML IV SOLN
1000.0000 mg | Freq: Four times a day (QID) | INTRAVENOUS | Status: AC
  Administered 2012-06-03 – 2012-06-04 (×4): 1000 mg via INTRAVENOUS
  Filled 2012-06-03 (×7): qty 100

## 2012-06-03 MED ORDER — PANTOPRAZOLE SODIUM 40 MG PO TBEC
40.0000 mg | DELAYED_RELEASE_TABLET | Freq: Every day | ORAL | Status: DC
  Administered 2012-06-04: 40 mg via ORAL
  Filled 2012-06-03 (×2): qty 1

## 2012-06-03 MED ORDER — ONDANSETRON HCL 4 MG PO TABS: 4.0000 mg | ORAL_TABLET | Freq: Four times a day (QID) | ORAL | Status: DC | PRN

## 2012-06-03 MED ORDER — PROPOFOL INFUSION 10 MG/ML OPTIME
INTRAVENOUS | Status: DC | PRN
  Administered 2012-06-03: 75 ug/kg/min via INTRAVENOUS

## 2012-06-03 MED ORDER — SODIUM CHLORIDE 0.9 % IR SOLN
Status: DC | PRN
  Administered 2012-06-03: 1000 mL

## 2012-06-03 MED ORDER — ACETAMINOPHEN 325 MG PO TABS: 650.0000 mg | ORAL_TABLET | Freq: Four times a day (QID) | ORAL | Status: DC | PRN

## 2012-06-03 MED ORDER — HYDROMORPHONE HCL PF 1 MG/ML IJ SOLN: 0.2500 mg | INTRAMUSCULAR | Status: DC | PRN

## 2012-06-03 MED ORDER — DIPHENHYDRAMINE HCL 12.5 MG/5ML PO ELIX: 12.5000 mg | ORAL_SOLUTION | ORAL | Status: DC | PRN

## 2012-06-03 MED ORDER — DEXAMETHASONE SODIUM PHOSPHATE 10 MG/ML IJ SOLN
10.0000 mg | Freq: Once | INTRAMUSCULAR | Status: AC
  Filled 2012-06-03: qty 1

## 2012-06-03 MED ORDER — SODIUM CHLORIDE 0.9 % IV SOLN: INTRAVENOUS | Status: DC

## 2012-06-03 MED ORDER — METHOCARBAMOL 500 MG PO TABS
500.0000 mg | ORAL_TABLET | Freq: Four times a day (QID) | ORAL | Status: DC | PRN
  Administered 2012-06-04 – 2012-06-06 (×7): 500 mg via ORAL
  Filled 2012-06-03 (×7): qty 1

## 2012-06-03 MED ORDER — DOCUSATE SODIUM 100 MG PO CAPS
100.0000 mg | ORAL_CAPSULE | Freq: Two times a day (BID) | ORAL | Status: DC
  Administered 2012-06-03 – 2012-06-05 (×4): 100 mg via ORAL

## 2012-06-03 MED ORDER — OMEPRAZOLE MAGNESIUM 20 MG PO TBEC: 20.0000 mg | DELAYED_RELEASE_TABLET | Freq: Every day | ORAL | Status: DC

## 2012-06-03 MED ORDER — CITALOPRAM HYDROBROMIDE 20 MG PO TABS
20.0000 mg | ORAL_TABLET | ORAL | Status: DC
  Administered 2012-06-04 – 2012-06-06 (×2): 20 mg via ORAL
  Filled 2012-06-03 (×2): qty 1

## 2012-06-03 MED ORDER — MIDAZOLAM HCL 5 MG/5ML IJ SOLN
INTRAMUSCULAR | Status: DC | PRN
  Administered 2012-06-03: 2 mg via INTRAVENOUS

## 2012-06-03 MED ORDER — RIVAROXABAN 10 MG PO TABS
10.0000 mg | ORAL_TABLET | Freq: Every day | ORAL | Status: DC
  Administered 2012-06-04 – 2012-06-06 (×3): 10 mg via ORAL
  Filled 2012-06-03 (×4): qty 1

## 2012-06-03 MED ORDER — HYDROMORPHONE HCL PF 1 MG/ML IJ SOLN
0.5000 mg | INTRAMUSCULAR | Status: DC | PRN
  Administered 2012-06-03: 0.5 mg via INTRAVENOUS
  Administered 2012-06-03 – 2012-06-04 (×5): 1 mg via INTRAVENOUS
  Filled 2012-06-03 (×6): qty 1

## 2012-06-03 MED ORDER — CEFAZOLIN SODIUM-DEXTROSE 2-3 GM-% IV SOLR
2.0000 g | Freq: Four times a day (QID) | INTRAVENOUS | Status: AC
  Administered 2012-06-03 (×2): 2 g via INTRAVENOUS
  Filled 2012-06-03 (×2): qty 50

## 2012-06-03 MED ORDER — MORPHINE SULFATE 2 MG/ML IJ SOLN
INTRAMUSCULAR | Status: AC
  Administered 2012-06-03: 1 mg via INTRAVENOUS
  Filled 2012-06-03: qty 1

## 2012-06-03 MED ORDER — POLYETHYLENE GLYCOL 3350 17 G PO PACK: 17.0000 g | PACK | Freq: Every day | ORAL | Status: DC | PRN

## 2012-06-03 MED ORDER — MENTHOL 3 MG MT LOZG: 1.0000 | LOZENGE | OROMUCOSAL | Status: DC | PRN

## 2012-06-03 MED ORDER — TRAMADOL HCL 50 MG PO TABS: 50.0000 mg | ORAL_TABLET | Freq: Four times a day (QID) | ORAL | Status: DC | PRN

## 2012-06-03 MED ORDER — DEXAMETHASONE 4 MG PO TABS
10.0000 mg | ORAL_TABLET | Freq: Once | ORAL | Status: AC
  Administered 2012-06-04: 10 mg via ORAL
  Filled 2012-06-03: qty 1

## 2012-06-03 MED ORDER — BUPIVACAINE HCL (PF) 0.75 % IJ SOLN
INTRAMUSCULAR | Status: DC | PRN
  Administered 2012-06-03: 13.5 mg via INTRATHECAL

## 2012-06-03 MED ORDER — 0.9 % SODIUM CHLORIDE (POUR BTL) OPTIME
TOPICAL | Status: DC | PRN
  Administered 2012-06-03: 1000 mL

## 2012-06-03 MED ORDER — BISACODYL 10 MG RE SUPP: 10.0000 mg | Freq: Every day | RECTAL | Status: DC | PRN

## 2012-06-03 MED ORDER — ALBUTEROL SULFATE HFA 108 (90 BASE) MCG/ACT IN AERS: 2.0000 | INHALATION_SPRAY | RESPIRATORY_TRACT | Status: DC | PRN

## 2012-06-03 MED ORDER — KETOROLAC TROMETHAMINE 15 MG/ML IJ SOLN
15.0000 mg | Freq: Once | INTRAMUSCULAR | Status: AC
  Administered 2012-06-03: 15 mg via INTRAVENOUS
  Filled 2012-06-03: qty 1

## 2012-06-03 MED ORDER — ACETAMINOPHEN 10 MG/ML IV SOLN
1000.0000 mg | Freq: Once | INTRAVENOUS | Status: AC
  Administered 2012-06-03: 1000 mg via INTRAVENOUS

## 2012-06-03 MED ORDER — KCL IN DEXTROSE-NACL 20-5-0.45 MEQ/L-%-% IV SOLN
INTRAVENOUS | Status: DC
  Administered 2012-06-03: 75 mL/h via INTRAVENOUS
  Administered 2012-06-04: 21:00:00 via INTRAVENOUS
  Filled 2012-06-03 (×5): qty 1000

## 2012-06-03 MED ORDER — PROMETHAZINE HCL 25 MG/ML IJ SOLN: 6.2500 mg | INTRAMUSCULAR | Status: DC | PRN

## 2012-06-03 MED ORDER — BUPIVACAINE 0.25 % ON-Q PUMP SINGLE CATH 300ML
INJECTION | Status: DC | PRN
  Administered 2012-06-03: 300 mL

## 2012-06-03 SURGICAL SUPPLY — 62 items
BAG ZIPLOCK 12X15 (MISCELLANEOUS) ×2 IMPLANT
BANDAGE ELASTIC 6 VELCRO ST LF (GAUZE/BANDAGES/DRESSINGS) ×2 IMPLANT
BANDAGE ESMARK 6X9 LF (GAUZE/BANDAGES/DRESSINGS) ×1 IMPLANT
BLADE SAG 18X100X1.27 (BLADE) ×2 IMPLANT
BLADE SAW SGTL 11.0X1.19X90.0M (BLADE) ×2 IMPLANT
BNDG ESMARK 6X9 LF (GAUZE/BANDAGES/DRESSINGS) ×2
CAP UPCHARGE REVISION TRAY ×2 IMPLANT
CATH KIT ON-Q SILVERSOAK 5IN (CATHETERS) ×2 IMPLANT
CEMENT HV SMART SET (Cement) ×6 IMPLANT
CLOTH BEACON ORANGE TIMEOUT ST (SAFETY) ×2 IMPLANT
CONT SPECI 4OZ STER CLIK (MISCELLANEOUS) IMPLANT
CUFF TOURN SGL QUICK 34 (TOURNIQUET CUFF)
CUFF TOURN SGL QUICK 44 (TOURNIQUET CUFF) ×2 IMPLANT
CUFF TRNQT CYL 34X4X40X1 (TOURNIQUET CUFF) IMPLANT
DRAPE EXTREMITY T 121X128X90 (DRAPE) ×2 IMPLANT
DRAPE POUCH INSTRU U-SHP 10X18 (DRAPES) ×2 IMPLANT
DRAPE U-SHAPE 47X51 STRL (DRAPES) ×2 IMPLANT
DRSG ADAPTIC 3X8 NADH LF (GAUZE/BANDAGES/DRESSINGS) ×2 IMPLANT
DRSG PAD ABDOMINAL 8X10 ST (GAUZE/BANDAGES/DRESSINGS) ×2 IMPLANT
DURAPREP 26ML APPLICATOR (WOUND CARE) ×2 IMPLANT
ELECT REM PT RETURN 9FT ADLT (ELECTROSURGICAL) ×2
ELECTRODE REM PT RTRN 9FT ADLT (ELECTROSURGICAL) ×1 IMPLANT
EVACUATOR 1/8 PVC DRAIN (DRAIN) ×2 IMPLANT
FACESHIELD LNG OPTICON STERILE (SAFETY) ×10 IMPLANT
GLOVE BIO SURGEON STRL SZ8 (GLOVE) ×2 IMPLANT
GLOVE BIOGEL PI IND STRL 8 (GLOVE) ×2 IMPLANT
GLOVE BIOGEL PI INDICATOR 8 (GLOVE) ×2
GLOVE ECLIPSE 8.0 STRL XLNG CF (GLOVE) ×2 IMPLANT
GLOVE SURG SS PI 6.5 STRL IVOR (GLOVE) ×4 IMPLANT
GOWN STRL NON-REIN LRG LVL3 (GOWN DISPOSABLE) ×4 IMPLANT
GOWN STRL REIN XL XLG (GOWN DISPOSABLE) ×2 IMPLANT
HANDPIECE INTERPULSE COAX TIP (DISPOSABLE) ×1
IMMOBILIZER KNEE 20 (SOFTGOODS) ×2
IMMOBILIZER KNEE 20 THIGH 36 (SOFTGOODS) ×1 IMPLANT
KIT BASIN OR (CUSTOM PROCEDURE TRAY) ×2 IMPLANT
MANIFOLD NEPTUNE II (INSTRUMENTS) ×2 IMPLANT
NS IRRIG 1000ML POUR BTL (IV SOLUTION) ×2 IMPLANT
PACK TOTAL JOINT (CUSTOM PROCEDURE TRAY) ×2 IMPLANT
PAD ABD 7.5X8 STRL (GAUZE/BANDAGES/DRESSINGS) ×2 IMPLANT
PADDING CAST COTTON 6X4 STRL (CAST SUPPLIES) ×2 IMPLANT
PENCIL BUTTON HOLSTER BLD 10FT (ELECTRODE) ×2 IMPLANT
POSITIONER SURGICAL ARM (MISCELLANEOUS) ×2 IMPLANT
RESTRICTOR CEMENT SZ 5 C-STEM (Cement) ×2 IMPLANT
SET HNDPC FAN SPRY TIP SCT (DISPOSABLE) ×1 IMPLANT
SPONGE GAUZE 4X4 12PLY (GAUZE/BANDAGES/DRESSINGS) ×2 IMPLANT
STAPLER VISISTAT 35W (STAPLE) ×2 IMPLANT
STEM TIBIA PFC 13X30MM (Stem) ×2 IMPLANT
STRIP CLOSURE SKIN 1/2X4 (GAUZE/BANDAGES/DRESSINGS) ×2 IMPLANT
SUCTION FRAZIER 12FR DISP (SUCTIONS) ×2 IMPLANT
SUT MNCRL AB 4-0 PS2 18 (SUTURE) ×2 IMPLANT
SUT VIC AB 2-0 CT1 27 (SUTURE) ×3
SUT VIC AB 2-0 CT1 TAPERPNT 27 (SUTURE) ×3 IMPLANT
SUT VLOC 180 0 24IN GS25 (SUTURE) ×2 IMPLANT
SWAB COLLECTION DEVICE MRSA (MISCELLANEOUS) IMPLANT
TOWEL OR 17X26 10 PK STRL BLUE (TOWEL DISPOSABLE) ×4 IMPLANT
TOWER CARTRIDGE SMART MIX (DISPOSABLE) ×2 IMPLANT
TRAY FOLEY CATH 14FRSI W/METER (CATHETERS) ×2 IMPLANT
TRAY SLEEVE CEM ML (Knees) ×2 IMPLANT
TUBE ANAEROBIC SPECIMEN COL (MISCELLANEOUS) IMPLANT
WATER STERILE IRR 1500ML POUR (IV SOLUTION) ×4 IMPLANT
WEDGE SIZE 4 5MM (Knees) ×2 IMPLANT
WRAP KNEE MAXI GEL POST OP (GAUZE/BANDAGES/DRESSINGS) ×2 IMPLANT

## 2012-06-03 NOTE — Brief Op Note (Signed)
06/03/2012  9:48 AM  PATIENT:  Rachel [name]  61 y.o. female  PRE-OPERATIVE DIAGNOSIS:  failed left knee unicompartmental replacement  POST-OPERATIVE DIAGNOSIS:  failed left knee unicompartmental replacement  PROCEDURE:  Procedure(s) (LRB) with comments: TOTAL KNEE REVISION (Left) - Revision of a Left Uni Knee to a Total Knee Arthroplasty  SURGEON:  Surgeon(s) and Role:    * Gearlean Alf, MD - Primary  PHYSICIAN ASSISTANT:   ASSISTANTS: Molli Barrows, PA-C   ANESTHESIA:   spinal  EBL:  Total I/O In: 1000 [I.V.:1000] Out: 250 [Urine:200; Blood:50]  BLOOD ADMINISTERED:none  DRAINS: (Medium) Hemovact drain(s) in the left knee with  Suction Open   LOCAL MEDICATIONS USED:  OTHER On-Q Marcaine pain pump  COUNTS:  YES  TOURNIQUET:   Total Tourniquet Time Documented: Thigh (Left) - 48 minutes  DICTATION: .Other Dictation: Dictation Number [id number]  PLAN OF CARE: Admit to inpatient   PATIENT DISPOSITION:  PACU - hemodynamically stable.

## 2012-06-03 NOTE — Anesthesia Procedure Notes (Signed)
Spinal  Patient location during procedure: OR Staffing Anesthesiologist: Lorenzo Pereyra Performed by: anesthesiologist  Preanesthetic Checklist Completed: patient identified, site marked, surgical consent, pre-op evaluation, timeout performed, IV checked, risks and benefits discussed and monitors and equipment checked Spinal Block Patient position: sitting Prep: Betadine Patient monitoring: heart rate, continuous pulse ox and blood pressure Approach: right paramedian Location: L3-4 Injection technique: single-shot Needle Needle type: Spinocan  Needle gauge: 22 G Needle length: 9 cm Additional Notes Expiration date of kit checked and confirmed. Patient tolerated procedure well, without complications.     

## 2012-06-03 NOTE — Interval H&P Note (Signed)
History and Physical Interval Note:  06/03/2012 8:16 AM  Rachel [name]  has presented today for surgery, with the diagnosis of failed left knee unicompartmental replacement  The various methods of treatment have been discussed with the patient and family. After consideration of risks, benefits and other options for treatment, the patient has consented to  Procedure(s) (LRB) with comments: TOTAL KNEE REVISION (Left) - Revision of a Left Uni Knee to a Total Knee Arthroplasty as a surgical intervention .  The patient's history has been reviewed, patient examined, no change in status, stable for surgery.  I have reviewed the patient's chart and labs.  Questions were answered to the patient's satisfaction.     Gearlean Alf

## 2012-06-03 NOTE — Progress Notes (Signed)
PT NOTE  Pt not medically ready for PT eval on POD0. Will check back on tomorrow. Thanks. Weston Anna, PT [phone]

## 2012-06-03 NOTE — Anesthesia Preprocedure Evaluation (Addendum)
Anesthesia Evaluation  Patient identified by MRN, date of birth, ID band Patient awake    Reviewed: Allergy & Precautions, H&P , NPO status , Patient's Chart, lab work & pertinent test results  Airway Mallampati: II TM Distance: >3 FB Neck ROM: Full    Dental No notable dental hx.    Pulmonary neg pulmonary ROS, asthma ,  breath sounds clear to auscultation  Pulmonary exam normal       Cardiovascular negative cardio ROS  Rhythm:Regular Rate:Normal     Neuro/Psych negative neurological ROS  negative psych ROS   GI/Hepatic negative GI ROS, Neg liver ROS, GERD-  Medicated and Controlled,  Endo/Other  negative endocrine ROSHypothyroidism   Renal/GU negative Renal ROS  negative genitourinary   Musculoskeletal negative musculoskeletal ROS (+)   Abdominal   Peds negative pediatric ROS (+)  Hematology negative hematology ROS (+)   Anesthesia Other Findings   Reproductive/Obstetrics negative OB ROS                          Anesthesia Physical Anesthesia Plan  ASA: II  Anesthesia Plan: Spinal   Post-op Pain Management:    Induction:   Airway Management Planned: Simple Face Mask  Additional Equipment:   Intra-op Plan:   Post-operative Plan: Extubation in OR  Informed Consent: I have reviewed the patients History and Physical, chart, labs and discussed the procedure including the risks, benefits and alternatives for the proposed anesthesia with the patient or authorized representative who has indicated his/her understanding and acceptance.   Dental advisory given  Plan Discussed with: CRNA  Anesthesia Plan Comments:         Anesthesia Quick Evaluation

## 2012-06-03 NOTE — H&P (View-Only) (Signed)
Rachel L. [facility]  DOB: 1950-11-03 Married / Language: English / Race: White Female  Date of Admission:  06/03/12  Chief Complaint:  Left Knee Pain  History of Present Illness The patient is a 61 year old female who comes in for a preoperative History and Physical. The patient is scheduled for a left conversion of uni knee to total knee replacement to be performed by Dr. Dione Plover. Aluisio, MD at [facility] on 06/03/2012. The patient is being followed for their left knee pain and osteoarthritis. Symptoms reported today include: pain, swelling and catching. The patient feels that they are doing poorly and report their pain level to be mild to moderate. The following medication has been used for pain control: Hydrocodone (prn). Caylah had a right knee conversion of a uni-knee to total knee about 4-5 years ago and did great with that. The left knee is getting progressively worse. She has a known loose uni-compartment replacement. She feels as though the pain has gotten progressively worse and worse. She wants to get this fixed. They have been treated conservatively in the past for the above stated problem and despite conservative measures, they continue to have progressive pain and severe functional limitations and dysfunction. They have failed non-operative management including home exercise, medications. It is felt that they would benefit from undergoing conversion of uni to total joint replacement. Risks and benefits of the procedure have been discussed with the patient and they elect to proceed with surgery. There are no active contraindications to surgery such as ongoing infection or rapidly progressive neurological disease.  Please note that at the time of her H&P, she described an epsiode of chest pain and discomfort that started on Sunday 05/10/12 after dinner and continued until bedtime. She stated that the pain was in the upper anterior chest and did have radiation of pain into  both the left and right arms and into the neck. The pain became quite severe at one point at which she thought she would have to go to the ED but it did subside eventually. It had resolved by the next morning, but reoccurred once she got out of her shower and continued until about 1 or 2 in the afternoon on Monday. These symtpoms were called over to Dr. Raul Del office and an appointment was arranged over at his office later today, 05/12/12.   Allergies Symbicort   Family History Cancer. sister Cerebrovascular Accident. father Congestive Heart Failure. mother and sister Heart disease in female family member before age 47 Hypertension. mother Osteoarthritis. mother and sister   Social History Alcohol use. current drinker; drinks wine; only occasionally per week current drinker; only occasionally per week Children. 1 Drug/Alcohol Rehab (Currently). no Copy of Drug/Alcohol Rehab (Previously). no Exercise. Exercises daily; does running / walking Illicit drug use. no Living situation. live with spouse Marital status. married Number of flights of stairs before winded. less than 1 Pain Contract. no Tobacco / smoke exposure. yes outdoors only no Tobacco use. never smoker   Medication History CeleXA ( Oral) Specific dose unknown - Active. Naproxen Sodium (500MG  Tablet ER, Oral) Active. Pulmicort Flexhaler (90MCG/ACT Aero Pow Br Act, Inhalation) Active. Synthroid ( Oral) Specific dose unknown - Active. PriLOSEC ( Oral) Specific dose unknown - Active. ProAir HFA (108 (90 Base)MCG/ACT Aerosol Soln, Inhalation) Active. Iron Combinations ( Oral) Active.   Past Surgical History Arthroscopy of Knee. bilateral Total Knee Replacement. right 06/25/2006 by Dr Wynelle Link Kidney Joaquim Lai Extraction. Date: 2010. Colonoscopy   Past Medical History Asthma  Blood Clot. Presumed DVT - age 89 - hospitalized for two weeks for treatment with heparin, then coumadin for  several months. Gastroesophageal Reflux Disease Hypercholesterolemia Hypothyroidism Kidney Stone Osteoarthritis Vertigo. Past History   Review of Systems General:Present- Fatigue and Feeling sick. Not Present- Chills, Fever, Night Sweats, Appetite Loss, Weight Gain and Weight Loss. Skin:Not Present- Itching, Rash, Skin Color Changes, Ulcer, Psoriasis and Change in Hair or Nails. HEENT:Present- Hearing problems and Ringing in the Ears. Not Present- Sensitivity to light and Nose Bleed. Respiratory:Present- Snoring and Dyspnea. Not Present- Chronic Cough and Bloody sputum. Cardiovascular:Present- Chest Pain and Swelling of Extremities. Not Present- Shortness of Breath, Leg Cramps and Palpitations. Gastrointestinal:Present- Heartburn. Not Present- Bloody Stool, Abdominal Pain, Vomiting, Nausea and Incontinence of Stool. Female Genitourinary:Not Present- Blood in Urine, Menstrual Irregularities, Frequency, Incontinence and Nocturia. Musculoskeletal:Present- Muscle Weakness, Joint Stiffness, Joint Swelling, Joint Pain and Back Pain. Not Present- Muscle Pain. Neurological:Present- Headaches. Not Present- Tingling, Numbness, Burning, Tremor and Dizziness. Psychiatric:Not Present- Anxiety, Depression and Memory Loss. Endocrine:Present- Heat Intolerance. Not Present- Cold Intolerance, Excessive hunger and Excessive Thirst.   Vitals Weight: 234 lb Height: 65.5 in Body Surface Area: 2.21 m Body Mass Index: 38.35 kg/m Pulse: 62 (Regular) Resp.: 12 (Unlabored) BP: 124/72 (Sitting, Left Arm, Standard)   Physical Exam The physical exam findings are as follows:  Note: Patient is a 61 year old female with continued knee pain. Patient is accompanied today by her husband.   General Mental Status - Alert, cooperative and good historian. General Appearance- pleasant. Not in acute distress. Orientation- Oriented X3. Build & Nutrition- Well nourished and Well  developed.   Head and Neck Head- normocephalic, atraumatic . Neck Global Assessment- supple. no bruit auscultated on the right and no bruit auscultated on the left.   Eye Pupil- Bilateral- Regular and Round. Motion- Bilateral- EOMI.  wears glasses  Chest and Lung Exam Auscultation: Breath sounds:- clear at anterior chest wall and - clear at posterior chest wall. Adventitious sounds:- No Adventitious sounds.  No wheezes noted today  Cardiovascular Auscultation:Rhythm- Regular rate and rhythm. Heart Sounds- S1 WNL and S2 WNL. Murmurs & Other Heart Sounds:Auscultation of the heart reveals - No Murmurs.   Abdomen Inspection:Contour- Generalized moderate distention. Palpation/Percussion:Tenderness- Abdomen is non-tender to palpation. Rigidity (guarding)- Abdomen is soft. Auscultation:Auscultation of the abdomen reveals - Bowel sounds normal.   Female Genitourinary  Not done, not pertinent to present illness  Musculoskeletal Her left knee shows a slight varus. Range is 5-120. There is moderate crepitus on range of motion. She is tender lateral and medial. No instability is noted.  RADIOGRAPHS: AP of both knees and lateral demonstrate the prosthesis on the right is in excellent position. There is no periprosthetic abnormalities. The left shows the uni-compartmental replacement has collapsed and she has tricompartmental bone on bone changes.  Assessment & Plan Osteoarthritis, knee (715.96) Impression: Left Knee Uni  Note: Patient is for a conversion left uni knee to a left total knee replacement by Dr. Wynelle Link.  Plan is to go home.  Please note that at the time of her H&P, she described an epsiode of chest pain and discomfort that started on Sunday 05/10/12 after dinner and continued until bedtime. She stated that the pain was in the upper anterior chest and did have radiation of pain into both the left and right arms and into the neck. The pain  became quite severe at one point at which she thought she would have to go to the ED but it did subside  eventually. It had resolved by the next morning, but reoccurred once she got out of her shower and continued until about 1 or 2 in the afternoon on Monday. These symtpoms were called over to Dr. Raul Del office and an appointment was arranged over at his office later today, 05/12/12.  Signed electronically by Jeneen Montgomery, PA-C

## 2012-06-03 NOTE — Transfer of Care (Signed)
Immediate Anesthesia Transfer of Care Note  Patient: Rachel [name]  Procedure(s) Performed: Procedure(s) (LRB): TOTAL KNEE REVISION (Left)  Patient Location: PACU  Anesthesia Type: Spinal  Level of Consciousness: sedated, patient cooperative and responds to stimulaton  Airway & Oxygen Therapy: Patient Spontanous Breathing and Patient connected to face mask oxgen  Post-op Assessment: Report given to PACU RN and Post -op Vital signs reviewed and stable  Post vital signs: Reviewed and stable  Complications: No apparent anesthesia complications [id number] level on exam with no discomfort.

## 2012-06-03 NOTE — Anesthesia Postprocedure Evaluation (Signed)
  Anesthesia Post-op Note  Patient: Rachel [name]  Procedure(s) Performed: Procedure(s) (LRB): TOTAL KNEE REVISION (Left)  Patient Location: PACU  Anesthesia Type: Spinal  Level of Consciousness: awake and alert   Airway and Oxygen Therapy: Patient Spontanous Breathing  Post-op Pain: mild  Post-op Assessment: Post-op Vital signs reviewed, Patient's Cardiovascular Status Stable, Respiratory Function Stable, Patent Airway and No signs of Nausea or vomiting  Post-op Vital Signs: stable  Complications: No apparent anesthesia complications

## 2012-06-04 ENCOUNTER — Encounter (HOSPITAL_COMMUNITY): Payer: Self-pay | Admitting: Orthopedic Surgery

## 2012-06-04 DIAGNOSIS — E876 Hypokalemia: Secondary | ICD-10-CM

## 2012-06-04 DIAGNOSIS — D62 Acute posthemorrhagic anemia: Secondary | ICD-10-CM

## 2012-06-04 LAB — BASIC METABOLIC PANEL
BUN: 12 mg/dL (ref 6–23)
Calcium: 8.3 mg/dL — ABNORMAL LOW (ref 8.4–10.5)
GFR calc non Af Amer: 69 mL/min — ABNORMAL LOW (ref >90)
Glucose, Bld: 155 mg/dL — ABNORMAL HIGH (ref 70–99)

## 2012-06-04 LAB — CBC
HCT: 34.7 % — ABNORMAL LOW (ref 36.0–46.0)
Hemoglobin: 11.4 g/dL — ABNORMAL LOW (ref 12.0–15.0)
MCH: 29.6 pg (ref 26.0–34.0)
MCHC: 32.9 g/dL (ref 30.0–36.0)
MCV: 90.1 fL (ref 78.0–100.0)

## 2012-06-04 MED ORDER — NON FORMULARY: 20.0000 mg | Freq: Every day | Status: DC

## 2012-06-04 MED ORDER — POTASSIUM CHLORIDE CRYS ER 20 MEQ PO TBCR
40.0000 meq | EXTENDED_RELEASE_TABLET | Freq: Two times a day (BID) | ORAL | Status: AC
  Administered 2012-06-04 (×2): 40 meq via ORAL
  Filled 2012-06-04 (×2): qty 2

## 2012-06-04 MED ORDER — OMEPRAZOLE 20 MG PO CPDR
20.0000 mg | DELAYED_RELEASE_CAPSULE | Freq: Every day | ORAL | Status: DC
  Administered 2012-06-05 – 2012-06-06 (×2): 20 mg via ORAL
  Filled 2012-06-04 (×4): qty 1

## 2012-06-04 MED ORDER — BUDESONIDE 90 MCG/ACT IN AEPB
180.0000 ug | INHALATION_SPRAY | Freq: Two times a day (BID) | RESPIRATORY_TRACT | Status: DC
  Administered 2012-06-04 – 2012-06-06 (×4): 2 via RESPIRATORY_TRACT

## 2012-06-04 NOTE — Progress Notes (Signed)
Physical Therapy Treatment Note   06/04/12 1500  PT Visit Information  Last PT Received On 06/04/12  Assistance Needed +1  PT Time Calculation  PT Start Time 1341  PT Stop Time 1409  PT Time Calculation (min) 28 min  Subjective Data  Subjective The pain is much better this afternoon.  Precautions  Precautions Knee  Required Braces or Orthoses Knee Immobilizer - Left  Restrictions  LLE Weight Bearing WBAT  Cognition  Overall Cognitive Status Appears within functional limits for tasks assessed/performed  Bed Mobility  Bed Mobility Supine to Sit;Sit to Supine  Supine to Sit 4: Min assist;HOB elevated  Sit to Supine 4: Min assist;HOB elevated  Details for Bed Mobility Assistance verbal cues for technique, assist for L LE  Transfers  Transfers Stand to Sit;Sit to Stand  Sit to Stand 4: Min assist;With upper extremity assist;From bed  Stand to Sit 4: Min assist;With upper extremity assist;To bed  Details for Transfer Assistance assist to rise and control descent, verbal cues for safe technique including hand placement and L LE forward  Ambulation/Gait  Ambulation/Gait Assistance 4: Min assist  Ambulation Distance (Feet) 40 Feet  Assistive device Rolling walker  Ambulation/Gait Assistance Details verbal cues for sequence, RW distance, step length, posture  Gait Pattern Step-to pattern;Decreased stance time - left;Antalgic  Gait velocity decreased  Exercises  Exercises Total Joint  Total Joint Exercises  Ankle Circles/Pumps AROM;Both;15 reps  Quad Sets AROM;Both;15 reps  Short Arc Quad AROM;Left;15 reps  Heel Slides AAROM;Left;15 reps  Hip ABduction/ADduction AROM;Left;15 reps  Straight Leg Raises AAROM;Left;15 reps  Goniometric ROM L knee AAROM -8-40* limited by pain  PT - End of Session  Equipment Utilized During Treatment Left knee immobilizer  Activity Tolerance Patient limited by fatigue;Patient limited by pain  Patient left in bed;with call bell/phone within reach;with  nursing in room  PT - Assessment/Plan  Comments on Treatment Session Pt performed exercises and ambulated in hallway.  Pt reports she feels much better this afternoon since pain is under control.  PT Plan Discharge plan remains appropriate;Frequency remains appropriate  Follow Up Recommendations Home health PT  Equipment Recommended 3 in 1 bedside comode  Acute Rehab PT Goals  PT Goal: Supine/Side to Sit - Progress Progressing toward goal  PT Goal: Sit to Supine/Side - Progress Progressing toward goal  PT Goal: Sit to Stand - Progress Progressing toward goal  PT Goal: Stand to Sit - Progress Progressing toward goal  PT Goal: Ambulate - Progress Progressing toward goal  PT Goal: Perform Home Exercise Program - Progress Progressing toward goal  PT General Charges  $$ ACUTE PT VISIT 1 Procedure  PT Treatments  $Gait Training 8-22 mins  $Therapeutic Exercise 8-22 mins    Pain: premedicated, 5/10 L knee pain, repositioned  Carmelia Bake, PT Pager: [phone]

## 2012-06-04 NOTE — Progress Notes (Signed)
   Subjective: 1 Day Post-Op Procedure(s) (LRB): CONVERSION OF UNI TO TOTAL KNEE REVISION (Left) Patient reports pain as mild and moderate.   Patient seen in rounds with Dr. Wynelle Link. Patient is well, but has had some minor complaints of pain in the knee, requiring pain medications We will start therapy today.  Plan is to go Home after hospital stay.  Objective: Vital signs in last 24 hours: Temp:  [96.8 F (36 C)-98.1 F (36.7 C)] 98.1 F (36.7 C) (11/07 0521) Pulse Rate:  [60-79] 79  (11/07 0521) Resp:  [12-16] 14  (11/07 0521) BP: (101-172)/(64-95) 101/68 mmHg (11/07 0521) SpO2:  [97 %-100 %] 97 % (11/07 0521) Weight:  [105.688 kg (233 lb)] 105.688 kg (233 lb) (11/06 1121)  Intake/Output from previous day:  Intake/Output Summary (Last 24 hours) at 06/04/12 0931 Last data filed at 06/04/12 0521  Gross per 24 hour  Intake 3172.5 ml  Output   1660 ml  Net 1512.5 ml    Intake/Output this shift: UOP 200 since MN Positive volume - Diurese  Labs:  [facility] 06/04/12 0420 06/01/12 1400  HGB 11.4* 14.2    Basename 06/04/12 0420 06/01/12 1400  WBC 9.0 6.8  RBC 3.85* 4.78  HCT 34.7* 42.2  PLT 251 310    Basename 06/04/12 0420 06/01/12 1400  NA 136 141  K 3.4* 3.3*  CL 97 100  CO2 33* 33*  BUN 12 17  CREATININE 0.88 0.71  GLUCOSE 155* 81  CALCIUM 8.3* 9.4    Basename 06/01/12 1400  LABPT --  INR 1.13    EXAM General - Patient is Alert, Appropriate and Oriented Extremity - Neurovascular intact Sensation intact distally Dorsiflexion/Plantar flexion intact No cellulitis present Dressing - dressing C/D/I and moderate drainage.  Dressing changed.  Incision with no active bleeding. Motor Function - intact, moving foot and toes well on exam.  Hemovac pulled without difficulty.  Past Medical History  Diagnosis Date  . Hypothyroidism   . Asthma   . Chronic kidney disease     hx of kidney stones, last 6 months ago  . GERD (gastroesophageal reflux disease)     . Arthritis   . Blood clot in vein  age 72    left leg  . Anemia     Assessment/Plan: 1 Day Post-Op Procedure(s) (LRB): TOTAL KNEE REVISION (Left) Principal Problem:  *Pain due to unicompartmental arthroplasty of knee Active Problems:  Postop Acute blood loss anemia  Postop Hypokalemia  Estimated Body mass index is 37.61 kg/(m^2) as calculated from the following:   Height as of this encounter: 5\' 6" (1.676 m).   Weight as of this encounter: 233 lb(105.688 kg). Advance diet Up with therapy Discharge home with home health  DVT Prophylaxis - Xarelto Weight-Bearing as tolerated to left leg No vaccines. D/C O2 and Pulse OX and try on Room [address]  Mickel Crow 06/04/2012, 9:31 AM

## 2012-06-04 NOTE — Progress Notes (Signed)
Physical Therapy Treatment Note   06/04/12 1200  PT Visit Information  Last PT Received On 06/04/12  Assistance Needed +2  PT Time Calculation  PT Start Time 1125  PT Stop Time 1134  PT Time Calculation (min) 9 min  Subjective Data  Subjective It's hurting real bad.  (L knee)  Precautions  Precautions Knee  Required Braces or Orthoses Knee Immobilizer - Left  Restrictions  LLE Weight Bearing WBAT  Cognition  Overall Cognitive Status Appears within functional limits for tasks assessed/performed  Bed Mobility  Bed Mobility Sit to Supine  Sit to Supine 4: Min assist;HOB elevated  Details for Bed Mobility Assistance verbal cues for technique, assist for L LE  Transfers  Transfers Stand to Sit;Sit to Stand;Stand Pivot Transfers  Sit to Stand 4: Min assist;With upper extremity assist;From chair/3-in-1;With armrests  Stand to Sit 4: Min assist;With upper extremity assist;To bed  Stand Pivot Transfers 4: Min assist  Details for Transfer Assistance assist to rise and control descent, verbal cues for hand placement and L LE forward, verbal cues for taking a few steps from recliner to bed  PT - End of Session  Equipment Utilized During Treatment Left knee immobilizer  Activity Tolerance Patient limited by pain  Patient left in bed;with call bell/phone within reach;with family/visitor present  Nurse Communication Patient requests pain meds  PT - Assessment/Plan  Comments on Treatment Session Pt requested assist back to bed.  Currently in too much pain for exercises so will attempt to return.  RN notified of pain and ice applied to knee.  PT Plan Discharge plan remains appropriate;Frequency remains appropriate  Follow Up Recommendations Home health PT  Equipment Recommended 3 in 1 bedside comode  Acute Rehab PT Goals  PT Goal: Sit to Supine/Side - Progress Progressing toward goal  PT Goal: Sit to Stand - Progress Progressing toward goal  PT Goal: Stand to Sit - Progress Progressing  toward goal  PT General Charges  $$ ACUTE PT VISIT 1 Procedure  PT Treatments  $Therapeutic Activity 8-22 mins    Carmelia Bake, PT Pager: [phone]

## 2012-06-04 NOTE — Progress Notes (Signed)
Brief pharmacy Note Re: Xarelto dose timing, SCIP  S/O Pt on Xarelto per MD for VTE prophylaxis after TKA, CrCL>30, DIs: none significant. First dose due at 0800 today. AET = yesterday @ 1018.   A/P To ensure SCIP criteria met, I spoke with RN Wendie Chess) and reminded her of the dose being due. I handed her the dose @ 0840. Of note, pt has SCD ordered and charted (since pt is s/p TKA, SCD will satisfy SCIP criteria).  Marcell Anger PharmD  [phone] 06/04/2012 8:40 AM

## 2012-06-04 NOTE — Progress Notes (Signed)
Utilization review completed.  

## 2012-06-04 NOTE — Op Note (Signed)
NAMEJIMESHA, Rachel [name]               ACCOUNT NO.:  [id number]  MEDICAL RECORD NO.:  [id number]  LOCATION:  [id number]                         FACILITY:  [facility]  PHYSICIAN:  Gaynelle Arabian, M.D.    DATE OF BIRTH:  [birth year]  DATE OF PROCEDURE:  06/03/2012 DATE OF DISCHARGE:                              OPERATIVE REPORT   PREOPERATIVE DIAGNOSIS:  Failed left knee unicompartmental replacement.  POSTOPERATIVE DIAGNOSIS:  Failed left knee unicompartmental replacement.  PROCEDURE:  Revision of left unicompartmental knee replacement to total knee arthroplasty.  SURGEON:  Gaynelle Arabian, M.D.  ASSISTANT:  Judith Part. Chabon, PA-C  ANESTHESIA:  Spinal.  ESTIMATED BLOOD LOSS:  Minimal.  DRAINS:  Hemovac x1.  COMPLICATIONS:  None.  CONDITION:  Stable to recovery.  TOURNIQUET TIME:  47 minutes at 300 mmHg.  BRIEF CLINICAL NOTE:  Rachel [name] is a 61 year old female who had unicompartmental knee replacement done many years ago.  She has gone on to develop arthritis in the other compartments of the knee with significant pain and dysfunction.  I had revised her unicompartmental to the total knee arthroplasty on the right side with good result.  She presents now for revision of unicompartmental replacement to total knee arthroplasty on the left.  PROCEDURE IN DETAIL:  After successful administration of spinal anesthetic, a tourniquet was placed high on her left thigh and left lower extremity, it was prepped and draped in the usual sterile fashion. Extremity was wrapped in Esmarch, knee flexed, tourniquet inflated to 300 mmHg.  A midline incision was made with 10-blade through subcutaneous tissue to the level of the extensor mechanism.  A fresh blade was used to make a medial parapatellar arthrotomy.  Soft tissue on the proximal medial tibia was subperiosteally elevated to the joint line with a knife and into the semimembranosus bursa with a Cobb elevator. Soft tissue laterally was elevated with  attention being paid to avoiding the patellar tendon on tibial tubercle.  Patella was everted, knee flexed to 90 degrees and PCL removed.  ACL was already gone.  I then used an osteotome to disrupt the interface between the medial component on the femur and the bone.  This was easily removed without difficulty consistent with loosening of the femoral component.  Drill was then used to irrigate a starting hole in the distal femur, and the canal was thoroughly irrigated.  A 5-degree left valgus alignment guide was placed and the block was pinned to remove 10 mm off the distal femur.  This got back beyond the defect created by removal of the component.  Resection was made with an oscillating saw.  The tibia was subluxed forward into the lateral meniscus removed. Extramedullary tibial alignment guide was placed, referencing proximally at the medial aspect of the tibial tubercle and distally along the second metatarsal axis and tibial crest.  Pins were removed 10 mm from the non-deficient lateral side.  Tibial resection was made with an oscillating saw.  I was easily able to remove the polyethylene tibial component.  I templated to remove an additional 5 mm off of the medial side to get underneath the cement mantle from that component.  This was subsequently made with an oscillating  saw.  Size 4 was the most appropriate tibial component.  Proximal tibia was prepared to modular drill and then the drill with a 13 x 30 stem extension to gain access to the tibial canal.  The keel punch was then placed.  The size for the tibial component was a 4 MBT revision tray with a 13 x 30 stem extension and a 5-mm medial augment.  I also prepared for 29-mm sleeve and placed that on a trial.  The trial tibial component was placed with excellent fit.  The femoral sizing guide was placed, size 4 was most appropriate. Rotation was marked at the epicondylar axis and confirmed by creating rectangular flexion gap  at 90 degrees.  Block was pinned in its position, and anterior, posterior and chamfer cuts were made. Intercondylar blocks were placed and neck cuts were made.  Trial size 4 posterior stabilized femur was placed.  A 12.5-mm posterior stabilized rotating platform insert trial was placed.  There was a tiny bit of hyperextension that was went to 15, which allowed for full extension with excellent varus-valgus and anterior-posterior balance throughout full range of motion.  Patella was everted, thickness measured to be 27 mm.  Freehand resection was taken to 15 mm, 38 template was placed, lug holes were drilled, trial patella was placed and it tracks normally. Osteophytes were removed off the posterior femur with the trial in place.  All trials were removed and the cut bone surface was repaired with pulsatile lavage.  I sized the tibial canal for cement restrictor, size 5 was most appropriate, and the size 5 restrictor was placed at the appropriate depth in the tibial canal.  Further irrigation was performed.  Cement was mixed and once ready for implantation, injected into the tibial canal and then placed on the cut bone surface.  Tibial component was impacted.  Once again, size 4 MBT revision tray, 29 sleeve, 5-mm medial augment, 13 x 30 stem extension.  It was impacted with excellent fit.  Femoral components were cemented into place and impacted.  A 15-mm trial insert was placed and the knee held in full extension.  All extruded cement were removed.  Patella was cemented into place, which was a size 38 and held with the patellar clamp.  Once the cement was fully hardened, then the permanent 15-mm posterior stabilized rotating platform insert was placed in the tibial tray.  Wound was copiously irrigated with saline solution, and arthrotomy was closed over a Hemovac drain with a running #1 V-Loc suture.  Tourniquet was released.  Total tourniquet time of 47 minutes.  Subcu was closed  with interrupted 2-0 Vicryl and subcuticular running 4-0 Monocryl.  Catheter for the Marcaine.  Pain pumps were placed and pumps initiated.  Incision was cleaned and dried, and Steri-Strips and a bulky sterile dressing applied.  She was placed into a knee immobilizer, awakened and transported to the recovery in stable condition.     Gaynelle Arabian, M.D.     FA/MEDQ  D:  06/03/2012  T:  06/04/2012  Job:  [id number]

## 2012-06-04 NOTE — Evaluation (Signed)
Physical Therapy Evaluation Patient Details Name: Rachel [name] MRN: [id number] DOB: 05/23/1951 Today's Date: 06/04/2012 Time: [id number] PT Time Calculation (min): 20 min  PT Assessment / Plan / Recommendation Clinical Impression  Pt s/p L unicompartmental knee revision to TKR.  Pt would benefit from acute PT services in order to improve independence with transfers, ambulation and step to return home with spouse.    PT Assessment  Patient needs continued PT services    Follow Up Recommendations  Home health PT    Does the patient have the potential to tolerate intense rehabilitation      Barriers to Discharge        Equipment Recommendations  3 in 1 bedside comode    Recommendations for Other Services     Frequency 7X/week    Precautions / Restrictions Precautions Precautions: Knee Required Braces or Orthoses: Knee Immobilizer - Left Restrictions LLE Weight Bearing: Weight bearing as tolerated   Pertinent Vitals/Pain 6/10 L knee, repositioned, RN aware      Mobility  Bed Mobility Bed Mobility: Supine to Sit Supine to Sit: HOB elevated;4: Min assist;With rails Details for Bed Mobility Assistance: verbal cues for technique, assist for L LE Transfers Transfers: Stand to Sit;Sit to Stand Sit to Stand: 4: Min assist;With upper extremity assist;From bed Stand to Sit: 4: Min assist;With upper extremity assist;To chair/3-in-1 Details for Transfer Assistance: assist to rise and control descent, verbal cues for hand placement and L LE forward Ambulation/Gait Ambulation/Gait Assistance: 4: Min assist Ambulation Distance (Feet): 8 Feet Assistive device: Rolling walker Ambulation/Gait Assistance Details: verbal cues for sequence, pt reported feeling dizzy and then became nauseous so brought recliner behind pt, sat EOC for a couple minutes until able to scoot back in chair and recline LEs Gait Pattern: Step-to pattern;Decreased stance time - left;Antalgic    Shoulder  Instructions     Exercises     PT Diagnosis: Difficulty walking;Acute pain  PT Problem List: Decreased strength;Decreased range of motion;Decreased mobility;Pain;Obesity PT Treatment Interventions: DME instruction;Gait training;Stair training;Patient/family education;Functional mobility training;Therapeutic exercise;Therapeutic activities   PT Goals Acute Rehab PT Goals PT Goal Formulation: With patient Time For Goal Achievement: 06/11/12 Potential to Achieve Goals: Good Pt will go Supine/Side to Sit: with modified independence PT Goal: Supine/Side to Sit - Progress: Goal set today Pt will go Sit to Supine/Side: with modified independence PT Goal: Sit to Supine/Side - Progress: Goal set today Pt will go Sit to Stand: with modified independence PT Goal: Sit to Stand - Progress: Goal set today Pt will go Stand to Sit: with modified independence PT Goal: Stand to Sit - Progress: Goal set today Pt will Ambulate: 51 - 150 feet;with modified independence;with least restrictive assistive device PT Goal: Ambulate - Progress: Goal set today Pt will Go Up / Down Stairs: 1-2 stairs;with supervision;with least restrictive assistive device PT Goal: Up/Down Stairs - Progress: Goal set today Pt will Perform Home Exercise Program: with supervision, verbal cues required/provided PT Goal: Perform Home Exercise Program - Progress: Goal set today  Visit Information  Last PT Received On: 06/04/12 Assistance Needed: +2    Subjective Data  Subjective: This is my 4th knee surgery.   Prior Functioning  Home Living Lives With: Spouse Type of Home: House Home Access: Stairs to enter Technical brewer of Steps: 1 Entrance Stairs-Rails: None Home Layout: One level Home Adaptive Equipment: Walker - rolling Prior Function Level of Independence: Independent with assistive device(s) Communication Communication: No difficulties    Cognition  Overall Cognitive Status:  Appears within functional  limits for tasks assessed/performed Arousal/Alertness: Awake/alert Orientation Level: Appears intact for tasks assessed Behavior During Session: [facility] for tasks performed    Extremity/Trunk Assessment Right Lower Extremity Assessment RLE ROM/Strength/Tone: [facility] for tasks assessed Left Lower Extremity Assessment LLE ROM/Strength/Tone: Deficits LLE ROM/Strength/Tone Deficits: unable to lift against gravity requiring assist, did not assess flexion due to pain, maintained KI   Balance    End of Session PT - End of Session Equipment Utilized During Treatment: Left knee immobilizer Activity Tolerance: Other (comment) (limited by dizziness, nausea) Patient left: in chair;with call bell/phone within reach;with nursing in room Nurse Communication: Other (comment) (nausea and RN in to check on pt)  GP     Beyza Bellino,KATHrine E 06/04/2012, 10:28 AM Pager: [phone]

## 2012-06-05 HISTORY — PX: REPLACEMENT TOTAL KNEE: SUR1224

## 2012-06-05 LAB — CBC
HCT: 30.4 % — ABNORMAL LOW (ref 36.0–46.0)
MCHC: 32.9 g/dL (ref 30.0–36.0)
MCV: 89.4 fL (ref 78.0–100.0)
RDW: 12.9 % (ref 11.5–15.5)
WBC: 11.7 10*3/uL — ABNORMAL HIGH (ref 4.0–10.5)

## 2012-06-05 LAB — BASIC METABOLIC PANEL
BUN: 12 mg/dL (ref 6–23)
Chloride: 100 mEq/L (ref 96–112)
Creatinine, Ser: 0.82 mg/dL (ref 0.50–1.10)
GFR calc Af Amer: 87 mL/min — ABNORMAL LOW (ref >90)

## 2012-06-05 MED ORDER — METHOCARBAMOL 500 MG PO TABS: 500.0000 mg | ORAL_TABLET | Freq: Four times a day (QID) | ORAL | Status: DC | PRN

## 2012-06-05 MED ORDER — RIVAROXABAN 10 MG PO TABS: 10.0000 mg | ORAL_TABLET | Freq: Every day | ORAL | Status: DC

## 2012-06-05 MED ORDER — OXYCODONE HCL 5 MG PO TABS: 5.0000 mg | ORAL_TABLET | ORAL | Status: DC | PRN

## 2012-06-05 NOTE — Progress Notes (Signed)
   Subjective: 2 Days Post-Op Procedure(s) (LRB): CONVERSION OF UNI TO TOTAL KNEE REVISION (Left) Patient reports pain as mild.   Patient seen in rounds with Dr. Wynelle Link. Patient is well, and has had no acute complaints or problems. Doing better today Plan is to go Home after hospital stay.  Objective: Vital signs in last 24 hours: Temp:  [98.3 F (36.8 C)-98.5 F (36.9 C)] 98.5 F (36.9 C) (11/08 2100) Pulse Rate:  [91-100] 100  (11/08 2100) Resp:  [16-20] 20  (11/08 2305) BP: (153-165)/(82-89) 158/87 mmHg (11/08 2100) SpO2:  [96 %-99 %] 97 % (11/08 2305)  Intake/Output from previous day:  Intake/Output Summary (Last 24 hours) at 06/05/12 2335 Last data filed at 06/05/12 1900  Gross per 24 hour  Intake  930.5 ml  Output   1650 ml  Net -719.5 ml    Intake/Output this shift:    Labs:  Basename 06/05/12 0420 06/04/12 0420  HGB 10.0* 11.4*    Basename 06/05/12 0420 06/04/12 0420  WBC 11.7* 9.0  RBC 3.40* 3.85*  HCT 30.4* 34.7*  PLT 241 251    Basename 06/05/12 0420 06/04/12 0420  NA 136 136  K 4.3 3.4*  CL 100 97  CO2 31 33*  BUN 12 12  CREATININE 0.82 0.88  GLUCOSE 152* 155*  CALCIUM 9.1 8.3*   No results found for this basename: LABPT:2,INR:2 in the last 72 hours  EXAM General - Patient is Alert, Appropriate and Oriented Extremity - Neurovascular intact Sensation intact distally Dorsiflexion/Plantar flexion intact No cellulitis present Dressing/Incision - clean, less drainage today Motor Function - intact, moving foot and toes well on exam.   Past Medical History  Diagnosis Date  . Hypothyroidism   . Asthma   . Chronic kidney disease     hx of kidney stones, last 6 months ago  . GERD (gastroesophageal reflux disease)   . Arthritis   . Blood clot in vein  age 61    left leg  . Anemia     Assessment/Plan: 2 Days Post-Op Procedure(s) (LRB): TOTAL KNEE REVISION (Left) Principal Problem:  *Pain due to unicompartmental arthroplasty of  knee Active Problems:  Postop Acute blood loss anemia  Postop Hypokalemia  Estimated Body mass index is 37.61 kg/(m^2) as calculated from the following:   Height as of this encounter: 5\' 6" (1.676 m).   Weight as of this encounter: 233 lb(105.688 kg). Up with therapy Discharge home with home health  DVT Prophylaxis - Xarelto Weight-Bearing as tolerated to left leg  PERKINS, ALEXZANDREW 06/05/2012, 11:35 PM

## 2012-06-05 NOTE — Evaluation (Signed)
Occupational Therapy Evaluation Patient Details Name: Rachel [name] MRN: [id number] DOB: Oct 19, 1950 Today's Date: 06/05/2012 Time: [id number] OT Time Calculation (min): 32 min  OT Assessment / Plan / Recommendation Clinical Impression  Pt is s/p L TKA and displays increased pain, decreased strength and functional mobility. She will benefit from skilled OT services to improve ADL independence for discharge home with family.     OT Assessment  Patient needs continued OT Services    Follow Up Recommendations  No OT follow up;Supervision/Assistance - 24 hour    Barriers to Discharge      Equipment Recommendations  3 in 1 bedside comode;Other (comment) (pt requesting wide 3in1)    Recommendations for Other Services    Frequency  Min 2X/week    Precautions / Restrictions Precautions Precautions: Knee Required Braces or Orthoses: Knee Immobilizer - Left Restrictions Weight Bearing Restrictions: No LLE Weight Bearing: Weight bearing as tolerated        ADL  Eating/Feeding: Simulated;Independent Where Assessed - Eating/Feeding: Chair Grooming: Performed;Wash/dry hands;Minimal assistance Where Assessed - Grooming: Unsupported standing Upper Body Bathing: Simulated;Chest;Right arm;Left arm;Abdomen;Set up Where Assessed - Upper Body Bathing: Unsupported sitting Lower Body Bathing: Simulated;Moderate assistance Where Assessed - Lower Body Bathing: Supported sit to stand Upper Body Dressing: Simulated;Set up Where Assessed - Upper Body Dressing: Unsupported sitting Lower Body Dressing: Simulated;Moderate assistance Where Assessed - Lower Body Dressing: Supported sit to stand Toilet Transfer: Performed;Minimal Manufacturing systems engineer: Raised toilet seat with arms (or 3-in-1 over toilet) Toileting - Clothing Manipulation and Hygiene: Minimal assistance;Performed;Other (comment) (underwear) Where Assessed - Toileting Clothing Manipulation and Hygiene: Sit to stand from  3-in-1 or toilet Tub/Shower Transfer Method: Not assessed Equipment Used: Rolling walker ADL Comments: Pt states she has some AE from previous surgeries years ago and remembers how to use it. She states spouse can also help at discharge. Pt has been on regular 3in1 and states "it is too small." She wants wide 3in1--informed case manager and requested they address cost with pt.     OT Diagnosis: Generalized weakness  OT Problem List: Decreased strength;Decreased activity tolerance;Decreased knowledge of use of DME or AE;Pain OT Treatment Interventions: Self-care/ADL training;Therapeutic activities;DME and/or AE instruction;Patient/family education   OT Goals Acute Rehab OT Goals OT Goal Formulation: With patient Time For Goal Achievement: 06/12/12 Potential to Achieve Goals: Good ADL Goals Pt Will Perform Grooming: with supervision;Standing at sink ADL Goal: Grooming - Progress: Goal set today Pt Will Transfer to Toilet: with supervision;Ambulation;with DME;Extra wide 3-in-1 ADL Goal: Toilet Transfer - Progress: Goal set today Pt Will Perform Toileting - Clothing Manipulation: with supervision;Standing ADL Goal: Toileting - Clothing Manipulation - Progress: Goal set today Pt Will Perform Tub/Shower Transfer: with min assist;with DME ADL Goal: Tub/Shower Transfer - Progress: Goal set today  Visit Information  Last OT Received On: 06/05/12 Assistance Needed: +1    Subjective Data  Subjective: pt greeting OT Patient Stated Goal: agreeable to OT; none stated.    Prior Functioning     Home Living Lives With: Spouse Type of Home: House Home Access: Stairs to enter Technical brewer of Steps: 1 Entrance Stairs-Rails: None Home Layout: One level Bathroom Shower/Tub: Multimedia programmer: Standard Home Adaptive Equipment: Environmental consultant - rolling;Reacher;Sock aid Prior Function Level of Independence: Independent with assistive device(s) Communication Communication: No  difficulties         Vision/Perception     Cognition  Overall Cognitive Status: Appears within functional limits for tasks assessed/performed Arousal/Alertness: Awake/alert Orientation Level: Appears intact for  tasks assessed Behavior During Session: [facility] for tasks performed    Extremity/Trunk Assessment Right Upper Extremity Assessment RUE ROM/Strength/Tone: [facility] for tasks assessed Left Upper Extremity Assessment LUE ROM/Strength/Tone: [facility] for tasks assessed     Mobility Bed Mobility Bed Mobility: Supine to Sit Supine to Sit: 4: Min assist;HOB elevated Details for Bed Mobility Assistance: verbal cues for technique, assist for L LE Transfers Transfers: Sit to Stand;Stand to Sit Sit to Stand: 4: Min assist;With upper extremity assist;From chair/3-in-1 Stand to Sit: 4: Min assist;With upper extremity assist;To chair/3-in-1 Details for Transfer Assistance: min verbal cues for technique and min assist to rise and balance and then to control descent to sit.     Shoulder Instructions     Exercise Total Joint Exercises Ankle Circles/Pumps: AROM;Both;15 reps Quad Sets: AROM;Both;15 reps Short Arc Quad: AROM;Left;15 reps Heel Slides: AAROM;Left;15 reps Hip ABduction/ADduction: AROM;Left;15 reps Straight Leg Raises: AAROM;Left;15 reps   Balance Balance Balance Assessed: Yes Dynamic Standing Balance Dynamic Standing - Level of Assistance: 4: Min assist   End of Session OT - End of Session Equipment Utilized During Treatment: Gait belt Activity Tolerance: Patient tolerated treatment well Patient left: in chair;with call bell/phone within reach  GO     Jules Schick [id number] 06/05/2012, 1:17 PM

## 2012-06-05 NOTE — Care Management Note (Signed)
    Page 1 of 2   06/05/2012     4:25:54 PM   CARE MANAGEMENT NOTE 06/05/2012  Patient:  Rachel [name], Rachel [name]   Account Number:  [id number]  Date Initiated:  06/05/2012  Documentation initiated by:  Sherrin Daisy  Subjective/Objective Assessment:   dx failed left knee unicompartmental replacemnt; revision to total knee replacemnt  Pre-arranged with Ar[facility] Go for [facility] services     Action/Plan:   CM spoke with patient and spouse. Plan are for patient to return to her home in [place] where spouse will be caregiver. Already has RW but needs wide RW. Arville Go will provide [facility] services   Anticipated DC Date:  06/06/2012   Anticipated DC Plan:  [place]  In-house referral  NA      DC Planning Services  CM consult      [facility] Choice  HOME HEALTH  DURABLE MEDICAL EQUIPMENT   Choice offered to / List presented to:  C-1 Patient   DME arranged  3-N-1      DME agency  [place] arranged  [place]   Status of service:  Completed, signed off Medicare Important Message given?  NO (If response is "NO", the following Medicare IM given date fields will be blank) Date Medicare IM given:   Date Additional Medicare IM given:    Discharge Disposition:    Per UR Regulation:    If discussed at Long Length of Stay Meetings, dates discussed:    Comments:  06/05/2012 Kem Kays RN CCM [phone] [facility] [employer] [facility] will start services day after patient is discharged.

## 2012-06-05 NOTE — Progress Notes (Signed)
Physical Therapy Treatment Patient Details Name: Rachel [name] MRN: [id number] DOB: 13-Apr-1951 Today's Date: 06/05/2012 Time: 0921-0949 PT Time Calculation (min): 28 min  PT Assessment / Plan / Recommendation Comments on Treatment Session  Pt ambulated in hallway and performed exercises.  Pt reports d/c likely tomorrow.    Follow Up Recommendations  Home health PT     Does the patient have the potential to tolerate intense rehabilitation     Barriers to Discharge        Equipment Recommendations  3 in 1 bedside comode    Recommendations for Other Services    Frequency     Plan Discharge plan remains appropriate;Frequency remains appropriate    Precautions / Restrictions Precautions Precautions: Knee Required Braces or Orthoses: Knee Immobilizer - Left Restrictions LLE Weight Bearing: Weight bearing as tolerated   Pertinent Vitals/Pain 8/10 L knee after exercises, premedicated, ice applied, repositioned    Mobility  Bed Mobility Bed Mobility: Supine to Sit Supine to Sit: 4: Min assist;HOB elevated Details for Bed Mobility Assistance: verbal cues for technique, assist for L LE Transfers Transfers: Stand to Sit;Sit to Stand Sit to Stand: 4: Min assist;With upper extremity assist;From bed Stand to Sit: 4: Min assist;With upper extremity assist;To chair/3-in-1 Details for Transfer Assistance: assist to rise and control descent, verbal cues for safe technique including hand placement and L LE forward Ambulation/Gait Ambulation/Gait Assistance: 4: Min guard Ambulation Distance (Feet): 45 Feet Assistive device: Rolling walker Ambulation/Gait Assistance Details: verbal cues for RW distance and step length Gait Pattern: Step-to pattern;Decreased stance time - left;Antalgic Gait velocity: decreased    Exercises Total Joint Exercises Ankle Circles/Pumps: AROM;Both;15 reps Quad Sets: AROM;Both;15 reps Short Arc Quad: AROM;Left;15 reps Heel Slides: AAROM;Left;15 reps Hip  ABduction/ADduction: AROM;Left;15 reps Straight Leg Raises: AAROM;Left;15 reps   PT Diagnosis:    PT Problem List:   PT Treatment Interventions:     PT Goals Acute Rehab PT Goals PT Goal: Supine/Side to Sit - Progress: Progressing toward goal PT Goal: Sit to Stand - Progress: Progressing toward goal PT Goal: Stand to Sit - Progress: Progressing toward goal PT Goal: Ambulate - Progress: Progressing toward goal PT Goal: Perform Home Exercise Program - Progress: Progressing toward goal  Visit Information  Last PT Received On: 06/05/12 Assistance Needed: +1    Subjective Data  Subjective: My stomach is moving around, I think from the [place].   Cognition  Overall Cognitive Status: Appears within functional limits for tasks assessed/performed    Balance     End of Session PT - End of Session Equipment Utilized During Treatment: Left knee immobilizer Activity Tolerance: Patient limited by fatigue;Patient limited by pain Patient left: in chair;with call bell/phone within reach   GP     Rachel [name],Rachel [name] 06/05/2012, 11:14 AM Pager: [phone]

## 2012-06-06 LAB — CBC
HCT: 29.4 % — ABNORMAL LOW (ref 36.0–46.0)
MCHC: 32 g/dL (ref 30.0–36.0)
RDW: 13.3 % (ref 11.5–15.5)

## 2012-06-06 NOTE — Discharge Summary (Signed)
Physician Discharge Summary  Patient ID: Rachel [name] MRN: [id number] DOB/AGE: 62-Jan-1952 62 y.o.  Admit date: 06/03/2012 Discharge date: 06/06/2012  Admission Diagnoses:  Principal Problem:  *Pain due to unicompartmental arthroplasty of knee Active Problems:  Postop Acute blood loss anemia  Postop Hypokalemia   Discharge Diagnoses:  Same   Surgeries: Procedure(s): TOTAL KNEE REVISION on 06/03/2012   Consultants: PT/OT  Discharged Condition: Stable  Hospital Course: Rachel [name] is an 61 y.o. female who was admitted 06/03/2012 with a chief complaint of No chief complaint on file. , and found to have a diagnosis of Pain due to unicompartmental arthroplasty of knee.  They were brought to the operating room on 06/03/2012 and underwent the above named procedures.    The patient had an uncomplicated hospital course and was stable for discharge.  Recent vital signs:  Filed Vitals:   06/06/12 0800  BP:   Pulse:   Temp:   Resp: 18    Recent laboratory studies:  Results for orders placed during the hospital encounter of 06/03/12  CBC      Component Value Range   WBC 9.0  4.0 - 10.5 K/uL   RBC 3.85 (*) 3.87 - 5.11 MIL/uL   Hemoglobin 11.4 (*) 12.0 - 15.0 g/dL   HCT 34.7 (*) 36.0 - 46.0 %   MCV 90.1  78.0 - 100.0 fL   MCH 29.6  26.0 - 34.0 pg   MCHC 32.9  30.0 - 36.0 g/dL   RDW 12.9  11.5 - 15.5 %   Platelets 251  150 - 400 K/uL  BASIC METABOLIC PANEL      Component Value Range   Sodium 136  135 - 145 mEq/L   Potassium 3.4 (*) 3.5 - 5.1 mEq/L   Chloride 97  96 - 112 mEq/L   CO2 33 (*) 19 - 32 mEq/L   Glucose, Bld 155 (*) 70 - 99 mg/dL   BUN 12  6 - 23 mg/dL   Creatinine, Ser 0.88  0.50 - 1.10 mg/dL   Calcium 8.3 (*) 8.4 - 10.5 mg/dL   GFR calc non Af Amer 69 (*) >90 mL/min   GFR calc Af Amer 80 (*) >90 mL/min  CBC      Component Value Range   WBC 11.7 (*) 4.0 - 10.5 K/uL   RBC 3.40 (*) 3.87 - 5.11 MIL/uL   Hemoglobin 10.0 (*) 12.0 - 15.0 g/dL   HCT 30.4 (*)  36.0 - 46.0 %   MCV 89.4  78.0 - 100.0 fL   MCH 29.4  26.0 - 34.0 pg   MCHC 32.9  30.0 - 36.0 g/dL   RDW 12.9  11.5 - 15.5 %   Platelets 241  150 - 400 K/uL  BASIC METABOLIC PANEL      Component Value Range   Sodium 136  135 - 145 mEq/L   Potassium 4.3  3.5 - 5.1 mEq/L   Chloride 100  96 - 112 mEq/L   CO2 31  19 - 32 mEq/L   Glucose, Bld 152 (*) 70 - 99 mg/dL   BUN 12  6 - 23 mg/dL   Creatinine, Ser 0.82  0.50 - 1.10 mg/dL   Calcium 9.1  8.4 - 10.5 mg/dL   GFR calc non Af Amer 75 (*) >90 mL/min   GFR calc Af Amer 87 (*) >90 mL/min  CBC      Component Value Range   WBC 9.8  4.0 - 10.5 K/uL   RBC  3.23 (*) 3.87 - 5.11 MIL/uL   Hemoglobin 9.4 (*) 12.0 - 15.0 g/dL   HCT 29.4 (*) 36.0 - 46.0 %   MCV 91.0  78.0 - 100.0 fL   MCH 29.1  26.0 - 34.0 pg   MCHC 32.0  30.0 - 36.0 g/dL   RDW 13.3  11.5 - 15.5 %   Platelets 255  150 - 400 K/uL    Discharge Medications:     Medication List     As of 06/06/2012 10:30 PM    STOP taking these medications         B-complex with vitamin C tablet      cholecalciferol 1000 UNITS tablet   Commonly known as: VITAMIN D      OVER THE COUNTER MEDICATION      vitamin C 500 MG tablet   Commonly known as: ASCORBIC ACID      TAKE these medications         citalopram 20 MG tablet   Commonly known as: CELEXA   Take 20 mg by mouth every other day.      Ferrous Sulfate 28 MG Tabs   Take by mouth every other day.      levothyroxine 75 MCG tablet   Commonly known as: SYNTHROID, LEVOTHROID   Take 75 mcg by mouth every evening. ON AN EMPTY STOMACH      methocarbamol 500 MG tablet   Commonly known as: ROBAXIN   Take 1 tablet (500 mg total) by mouth every 6 (six) hours as needed.      omeprazole 20 MG tablet   Commonly known as: PRILOSEC OTC   Take 20 mg by mouth daily before breakfast.      oxyCODONE 5 MG immediate release tablet   Commonly known as: Oxy IR/ROXICODONE   Take 1-2 tablets (5-10 mg total) by mouth every 3 (three) hours as  needed.      PROAIR HFA 108 (90 BASE) MCG/ACT inhaler   Generic drug: albuterol   Inhale 2 puffs into the lungs every 4 (four) hours as needed. Wheezing and shortness of breath      PULMICORT FLEXHALER 90 MCG/ACT inhaler   Generic drug: Budesonide   Inhale 2 puffs into the lungs 2 (two) times daily.      rivaroxaban 10 MG Tabs tablet   Commonly known as: XARELTO   Take 1 tablet (10 mg total) by mouth daily with breakfast. Take Xarelto for two and a half more weeks, then discontinue Xarelto.        Diagnostic Studies: Dg Chest 2 View  06/01/2012  *RADIOLOGY REPORT*  Clinical Data: Nonsmoker, preoperative evaluation for total knee replacement  CHEST - 2 VIEW  Comparison: 06/27/2006  Findings: The heart and pulmonary vascularity are within normal limits.  The lungs are free of acute infiltrate or sizable effusion.  The osseous structures are within normal limits.  IMPRESSION: No acute intrathoracic abnormality.   Original Report Authenticated By: Inez Catalina, M.D.     Disposition: 01-Home or Self Care      Discharge Orders    Future Orders Please Complete By Expires   Diet - low sodium heart healthy      Diet - low sodium heart healthy      Call MD / Call 911      Comments:   If you experience chest pain or shortness of breath, CALL 911 and be transported to the hospital emergency room.  If you develope a fever above 101  F, pus (white drainage) or increased drainage or redness at the wound, or calf pain, call your surgeon's office.   Discharge instructions      Comments:   Pick up stool softner and laxative for home. Do not submerge incision under water. May shower. Continue to use ice for pain and swelling from surgery.  Take Xarelto for two and a half more weeks, then discontinue Xarelto.   Constipation Prevention      Comments:   Drink plenty of fluids.  Prune juice may be helpful.  You may use a stool softener, such as Colace (over the counter) 100 mg twice a day.  Use  MiraLax (over the counter) for constipation as needed.   Increase activity slowly as tolerated      Patient may shower      Comments:   You may shower without a dressing once there is no drainage.  Do not wash over the wound.  If drainage remains, do not shower until drainage stops.   Weight bearing as tolerated      Driving restrictions      Comments:   No driving until released by the physician.   Lifting restrictions      Comments:   No lifting until released by the physician.   TED hose      Comments:   Use stockings (TED hose) for 3 weeks on both leg(s).  You may remove them at night for sleeping.   Change dressing      Comments:   Change dressing daily with sterile 4 x 4 inch gauze dressing and apply TED hose. Do not submerge the incision under water.   Do not put a pillow under the knee. Place it under the heel.      Do not sit on low chairs, stoools or toilet seats, as it may be difficult to get up from low surfaces      Call MD / Call 911      Comments:   If you experience chest pain or shortness of breath, CALL 911 and be transported to the hospital emergency room.  If you develope a fever above 101 F, pus (white drainage) or increased drainage or redness at the wound, or calf pain, call your surgeon's office.   Constipation Prevention      Comments:   Drink plenty of fluids.  Prune juice may be helpful.  You may use a stool softener, such as Colace (over the counter) 100 mg twice a day.  Use MiraLax (over the counter) for constipation as needed.   Increase activity slowly as tolerated         Follow-up Information    Follow up with Gearlean Alf, MD. Schedule an appointment as soon as possible for a visit in 2 weeks.   Contact information:   [address] [place], [address] [place] [address] [id number]           Signed: Ventura Bruns 06/06/2012, 10:30 PM

## 2012-06-06 NOTE — Progress Notes (Signed)
Discharged from floor via w/c, spouse with pt. No changes in assessment. Shuna Tabor   

## 2012-06-06 NOTE — Progress Notes (Signed)
Occupational Therapy Treatment Patient Details Name: Rachel [name] MRN: [id number] DOB: 01/24/1951 Today's Date: 06/06/2012 Time: [id number] OT Time Calculation (min): 24 min  OT Assessment / Plan / Recommendation Comments on Treatment Session Pt. doing well with BADLs, and functional transfers.  Pt. reports husband will assist as needed at discharge.  She has DME - x-wide 3-in-1 being ordered    Follow Up Recommendations  No OT follow up;Supervision/Assistance - 24 hour    Barriers to Discharge       Equipment Recommendations  3 in 1 bedside comode;Other (comment) (x-wide)    Recommendations for Other Services    Frequency Min 2X/week   Plan Discharge plan remains appropriate    Precautions / Restrictions Precautions Precautions: Knee Required Braces or Orthoses: Knee Immobilizer - Left Restrictions Weight Bearing Restrictions: No LLE Weight Bearing: Weight bearing as tolerated       ADL  Grooming: Wash/dry hands;Wash/dry face;Min guard Where Assessed - Grooming: Supported standing Toilet Transfer: Magazine features editor Method: Sit to Loss adjuster, chartered: Raised toilet seat with arms (or 3-in-1 over toilet) Toileting - Water quality scientist and Hygiene: Min guard Where Assessed - Best boy and Hygiene: Standing Tub/Shower Transfer: Nurse, learning disability Method: Therapist, art: Walk in shower;Shower seat without back Equipment Used: Rolling walker;Knee Immobilizer Transfers/Ambulation Related to ADLs: min guard assist ADL Comments: Pt. requires cues for technique with maneuvering walker in tight/small spaces.  Pt. able to demonstrate good technique for Toilet and shower transfers. Pt. with complain of abdominal cramping    OT Diagnosis:    OT Problem List:   OT Treatment Interventions:     OT Goals ADL Goals ADL Goal: Grooming - Progress: Progressing toward goals ADL Goal: Toilet Transfer -  Progress: Progressing toward goals ADL Goal: Toileting - Clothing Manipulation - Progress: Progressing toward goals ADL Goal: Tub/Shower Transfer - Progress: Met  Visit Information  Last OT Received On: 06/06/12 Assistance Needed: +1    Subjective Data      Prior Functioning       Cognition  Overall Cognitive Status: Appears within functional limits for tasks assessed/performed Arousal/Alertness: Awake/alert Orientation Level: Appears intact for tasks assessed Behavior During Session: [facility] for tasks performed    Mobility  Shoulder Instructions Bed Mobility Bed Mobility: Sit to Supine Supine to Sit: 5: Supervision Sit to Supine: 4: Min assist;HOB flat Details for Bed Mobility Assistance: assist for Lt. LE.  She reports that spouse will assist her at home Transfers Transfers: Sit to Stand;Stand to Sit Sit to Stand: 4: Min guard;From chair/3-in-1;With upper extremity assist Stand to Sit: 4: Min guard;With upper extremity assist;To chair/3-in-1 Details for Transfer Assistance: verbal cue for L LE forward       Exercises  Total Joint Exercises Ankle Circles/Pumps: AROM;Both;15 reps Quad Sets: AROM;Both;15 reps Short Arc Quad: AROM;Left;15 reps Heel Slides: AAROM;Left;15 reps Hip ABduction/ADduction: AROM;Left;15 reps   Balance Balance Balance Assessed: Yes Dynamic Standing Balance Dynamic Standing - Level of Assistance: 5: Stand by assistance High Level Balance High Level Balance Activites: Other (comment) (ADLs)   End of Session OT - End of Session Equipment Utilized During Treatment: Left knee immobilizer Activity Tolerance: Patient tolerated treatment well Patient left: with call bell/phone within reach;in bed Nurse Communication: Other (comment) (desire for bath) CPM Left Knee CPM Left Knee: Off  GO     Lucille Passy M 06/06/2012, 10:38 AM

## 2012-06-06 NOTE — Progress Notes (Signed)
Physical Therapy Treatment Patient Details Name: Rachel [name] MRN: [id number] DOB: 1950-11-16 Today's Date: 06/06/2012 Time: [id number] PT Time Calculation (min): 28 min  PT Assessment / Plan / Recommendation Comments on Treatment Session  Pt ambulated in hallway, practiced one step and performed exercises.  Pt instructed to use KI with standing and mobility due to inability to perform SLR.  Pt had no further questions/concerns regarding d/c home today.    Follow Up Recommendations  Home health PT     Does the patient have the potential to tolerate intense rehabilitation     Barriers to Discharge        Equipment Recommendations  3 in 1 bedside comode;Other (comment)    Recommendations for Other Services    Frequency     Plan Discharge plan remains appropriate;Frequency remains appropriate    Precautions / Restrictions Precautions Precautions: Knee Required Braces or Orthoses: Knee Immobilizer - Left Restrictions Weight Bearing Restrictions: No LLE Weight Bearing: Weight bearing as tolerated   Pertinent Vitals/Pain 7/10 L knee, RN notified and brought meds, repositioned, ice applied    Mobility  Bed Mobility Bed Mobility: Supine to Sit Supine to Sit: 5: Supervision Details for Bed Mobility Assistance: verbal cues for technique Transfers Transfers: Stand to Sit;Sit to Stand Sit to Stand: With upper extremity assist;From bed;5: Supervision;4: Min guard Stand to Sit: With upper extremity assist;To chair/3-in-1;5: Supervision;4: Min guard Details for Transfer Assistance: verbal cue for L LE forward Ambulation/Gait Ambulation/Gait Assistance: 5: Supervision Ambulation Distance (Feet): 40 Feet (x2) Assistive device: Rolling walker Ambulation/Gait Assistance Details: pt did well with gait, verbal cue for posture Gait Pattern: Step-to pattern;Decreased stance time - left;Antalgic Gait velocity: decreased Stairs: Yes Stairs Assistance: 4: Min guard Stairs Assistance  Details (indicate cue type and reason): verbal cue for sequence and RW placement Stair Management Technique: Step to pattern;Forwards;With walker Number of Stairs: 1     Exercises Total Joint Exercises Ankle Circles/Pumps: AROM;Both;15 reps Quad Sets: AROM;Both;15 reps Short Arc Quad: AROM;Left;15 reps Heel Slides: AAROM;Left;15 reps Hip ABduction/ADduction: AROM;Left;15 reps   PT Diagnosis:    PT Problem List:   PT Treatment Interventions:     PT Goals Acute Rehab PT Goals PT Goal: Supine/Side to Sit - Progress: Progressing toward goal PT Goal: Sit to Stand - Progress: Progressing toward goal PT Goal: Stand to Sit - Progress: Progressing toward goal PT Goal: Ambulate - Progress: Progressing toward goal PT Goal: Up/Down Stairs - Progress: Progressing toward goal PT Goal: Perform Home Exercise Program - Progress: Progressing toward goal  Visit Information  Last PT Received On: 06/06/12 Assistance Needed: +1    Subjective Data  Subjective: My stomach is still bothering me. (no BM yet per pt)   Cognition  Overall Cognitive Status: Appears within functional limits for tasks assessed/performed    Balance     End of Session PT - End of Session Equipment Utilized During Treatment: Left knee immobilizer Activity Tolerance: Patient limited by pain Patient left: in chair;with call bell/phone within reach Nurse Communication: Patient requests pain meds CPM Left Knee CPM Left Knee: Off   GP     Lavina Resor,KATHrine E 06/06/2012, 10:28 AM Pager: [phone]

## 2012-06-06 NOTE — Progress Notes (Signed)
Orthopedics Progress Note  Subjective: Pt doing well s/p knee replacement Pt ready for discharge  Objective:  Filed Vitals:   06/06/12 0525  BP:   Pulse: 100  Temp: 98.4 F (36.9 C)  Resp: 18    General: Awake and alert  Musculoskeletal: left knee incision healing well, nv intact distally dressing changed Neurovascularly intact  Lab Results  Component Value Date   WBC 9.8 06/06/2012   HGB 9.4* 06/06/2012   HCT 29.4* 06/06/2012   MCV 91.0 06/06/2012   PLT 255 06/06/2012       Component Value Date/Time   NA 136 06/05/2012 0420   K 4.3 06/05/2012 0420   CL 100 06/05/2012 0420   CO2 31 06/05/2012 0420   GLUCOSE 152* 06/05/2012 0420   BUN 12 06/05/2012 0420   CREATININE 0.82 06/05/2012 0420   CALCIUM 9.1 06/05/2012 0420   GFRNONAA 75* 06/05/2012 0420   GFRAA 87* 06/05/2012 0420    Lab Results  Component Value Date   INR 1.13 06/01/2012    Assessment/Plan: POD #3 s/p Procedure(s):left  TOTAL KNEE REVISION  D/c home today  F/u in 2 weeks  Doran Heater. Veverly Fells, MD 06/06/2012 7:07 AM

## 2012-06-12 ENCOUNTER — Emergency Department (HOSPITAL_COMMUNITY): Payer: BC Managed Care – PPO

## 2012-06-12 ENCOUNTER — Encounter (HOSPITAL_COMMUNITY): Payer: Self-pay | Admitting: Emergency Medicine

## 2012-06-12 ENCOUNTER — Observation Stay (HOSPITAL_COMMUNITY)
Admission: EM | Admit: 2012-06-12 | Discharge: 2012-06-15 | Disposition: A | Discharge status: Skilled Nursing Facility | Payer: BC Managed Care – PPO | Attending: Internal Medicine | Admitting: Internal Medicine

## 2012-06-12 DIAGNOSIS — K219 Gastro-esophageal reflux disease without esophagitis: Secondary | ICD-10-CM | POA: Insufficient documentation

## 2012-06-12 DIAGNOSIS — M79609 Pain in unspecified limb: Principal | ICD-10-CM | POA: Insufficient documentation

## 2012-06-12 DIAGNOSIS — M25519 Pain in unspecified shoulder: Secondary | ICD-10-CM | POA: Insufficient documentation

## 2012-06-12 DIAGNOSIS — R296 Repeated falls: Secondary | ICD-10-CM | POA: Insufficient documentation

## 2012-06-12 DIAGNOSIS — Z96659 Presence of unspecified artificial knee joint: Secondary | ICD-10-CM

## 2012-06-12 DIAGNOSIS — E039 Hypothyroidism, unspecified: Secondary | ICD-10-CM | POA: Insufficient documentation

## 2012-06-12 DIAGNOSIS — W19XXXA Unspecified fall, initial encounter: Secondary | ICD-10-CM | POA: Diagnosis present

## 2012-06-12 DIAGNOSIS — Y92009 Unspecified place in unspecified non-institutional (private) residence as the place of occurrence of the external cause: Secondary | ICD-10-CM | POA: Insufficient documentation

## 2012-06-12 DIAGNOSIS — B370 Candidal stomatitis: Secondary | ICD-10-CM | POA: Diagnosis present

## 2012-06-12 DIAGNOSIS — R32 Unspecified urinary incontinence: Secondary | ICD-10-CM | POA: Diagnosis present

## 2012-06-12 DIAGNOSIS — Z79899 Other long term (current) drug therapy: Secondary | ICD-10-CM | POA: Insufficient documentation

## 2012-06-12 MED ORDER — OXYCODONE-ACETAMINOPHEN 5-325 MG PO TABS
2.0000 | ORAL_TABLET | Freq: Once | ORAL | Status: AC
  Administered 2012-06-12: 2 via ORAL

## 2012-06-12 MED ORDER — OXYCODONE-ACETAMINOPHEN 5-325 MG PO TABS
ORAL_TABLET | ORAL | Status: AC
  Administered 2012-06-12: 2 via ORAL
  Filled 2012-06-12: qty 2

## 2012-06-12 NOTE — ED Notes (Signed)
Per EMS. Patient has Hx of Left Knee replacement, which was replaced again this past Wednesday (at [facility]). Patient was using walker at home and fell in shower. C/O Left wrist and shoulder pain. Patient fell onto left side in shower. Patient denies hitting her head.

## 2012-06-12 NOTE — ED Notes (Signed)
Patient states she was in the shower and stumbled and fell backwards into the corner of the shower. Overall rates her pain 5/10, "mostly in my leg (left)". Patient states pain in left wrist 0/10 when not moving. Patient states pain 4/10 in left shoulder. 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

## 2012-06-12 NOTE — ED Notes (Signed)
Patient transported to X-ray 

## 2012-06-12 NOTE — ED Notes (Signed)
2 ice packs given. One for left knee, one for left shoulder.

## 2012-06-12 NOTE — ED Notes (Signed)
Patient states last dose of pain medicine at 2100, Oxycodone 5mg  x2, q 3 hours.

## 2012-06-12 NOTE — ED Notes (Signed)
[id number] Expected date:06/12/12<BR> Expected time:11:05 PM<BR> Means of arrival:Ambulance<BR> Comments:<BR> Medic 241, 61 F, fall

## 2012-06-13 ENCOUNTER — Emergency Department (HOSPITAL_COMMUNITY): Payer: BC Managed Care – PPO

## 2012-06-13 DIAGNOSIS — R32 Unspecified urinary incontinence: Secondary | ICD-10-CM | POA: Diagnosis present

## 2012-06-13 DIAGNOSIS — W19XXXA Unspecified fall, initial encounter: Secondary | ICD-10-CM | POA: Diagnosis present

## 2012-06-13 LAB — CBC WITH DIFFERENTIAL/PLATELET
Basophils Absolute: 0 10*3/uL (ref 0.0–0.1)
Eosinophils Relative: 2 % (ref 0–5)
Lymphocytes Relative: 18 % (ref 12–46)
Lymphs Abs: 1.6 10*3/uL (ref 0.7–4.0)
Neutro Abs: 5.8 10*3/uL (ref 1.7–7.7)
Neutrophils Relative %: 67 % (ref 43–77)
Platelets: 573 10*3/uL — ABNORMAL HIGH (ref 150–400)
RBC: 3.45 MIL/uL — ABNORMAL LOW (ref 3.87–5.11)
RDW: 13.5 % (ref 11.5–15.5)
WBC: 8.6 10*3/uL (ref 4.0–10.5)

## 2012-06-13 LAB — BASIC METABOLIC PANEL
CO2: 26 mEq/L (ref 19–32)
Calcium: 9.1 mg/dL (ref 8.4–10.5)
Glucose, Bld: 98 mg/dL (ref 70–99)
Potassium: 3.2 mEq/L — ABNORMAL LOW (ref 3.5–5.1)
Sodium: 135 mEq/L (ref 135–145)

## 2012-06-13 MED ORDER — LEVOTHYROXINE SODIUM 75 MCG PO TABS
75.0000 ug | ORAL_TABLET | Freq: Every evening | ORAL | Status: DC
  Administered 2012-06-13 – 2012-06-15 (×3): 75 ug via ORAL
  Filled 2012-06-13 (×3): qty 1

## 2012-06-13 MED ORDER — SODIUM CHLORIDE 0.9 % IV SOLN: 250.0000 mL | INTRAVENOUS | Status: DC | PRN

## 2012-06-13 MED ORDER — SODIUM CHLORIDE 0.9 % IJ SOLN: 3.0000 mL | INTRAMUSCULAR | Status: DC | PRN

## 2012-06-13 MED ORDER — SODIUM CHLORIDE 0.9 % IV SOLN
INTRAVENOUS | Status: DC
  Administered 2012-06-13: 02:00:00 via INTRAVENOUS

## 2012-06-13 MED ORDER — OXYCODONE HCL 5 MG PO TABS
5.0000 mg | ORAL_TABLET | ORAL | Status: DC | PRN
  Administered 2012-06-13 – 2012-06-15 (×15): 10 mg via ORAL
  Filled 2012-06-13 (×15): qty 2

## 2012-06-13 MED ORDER — METHOCARBAMOL 500 MG PO TABS
500.0000 mg | ORAL_TABLET | Freq: Four times a day (QID) | ORAL | Status: DC | PRN
  Administered 2012-06-14 – 2012-06-15 (×6): 500 mg via ORAL
  Filled 2012-06-13 (×6): qty 1

## 2012-06-13 MED ORDER — RIVAROXABAN 10 MG PO TABS
10.0000 mg | ORAL_TABLET | Freq: Every day | ORAL | Status: DC
Start: 2012-06-13 — End: 2012-06-15
  Administered 2012-06-13 – 2012-06-15 (×3): 10 mg via ORAL
  Filled 2012-06-13 (×4): qty 1

## 2012-06-13 MED ORDER — NON FORMULARY: 20.0000 mg | Freq: Every morning | Status: DC

## 2012-06-13 MED ORDER — PANTOPRAZOLE SODIUM 40 MG PO TBEC
40.0000 mg | DELAYED_RELEASE_TABLET | Freq: Every day | ORAL | Status: DC
  Filled 2012-06-13 (×2): qty 1

## 2012-06-13 MED ORDER — OMEPRAZOLE 20 MG PO CPDR
20.0000 mg | DELAYED_RELEASE_CAPSULE | Freq: Every day | ORAL | Status: DC
  Administered 2012-06-13 – 2012-06-15 (×3): 20 mg via ORAL
  Filled 2012-06-13 (×3): qty 1

## 2012-06-13 MED ORDER — ALBUTEROL SULFATE HFA 108 (90 BASE) MCG/ACT IN AERS: 2.0000 | INHALATION_SPRAY | RESPIRATORY_TRACT | Status: DC | PRN

## 2012-06-13 MED ORDER — SODIUM CHLORIDE 0.9 % IJ SOLN
3.0000 mL | Freq: Two times a day (BID) | INTRAMUSCULAR | Status: DC
  Administered 2012-06-14 (×2): 3 mL via INTRAVENOUS

## 2012-06-13 MED ORDER — CITALOPRAM HYDROBROMIDE 20 MG PO TABS
20.0000 mg | ORAL_TABLET | ORAL | Status: DC
  Administered 2012-06-13 – 2012-06-15 (×2): 20 mg via ORAL
  Filled 2012-06-13 (×3): qty 1

## 2012-06-13 MED ORDER — HYDRALAZINE HCL 20 MG/ML IJ SOLN: 5.0000 mg | Freq: Four times a day (QID) | INTRAMUSCULAR | Status: DC | PRN

## 2012-06-13 MED ORDER — NYSTATIN 100000 UNIT/ML MT SUSP
100000.0000 [IU] | Freq: Two times a day (BID) | OROMUCOSAL | Status: DC
  Administered 2012-06-13 – 2012-06-15 (×5): 100000 [IU] via ORAL
  Filled 2012-06-13 (×7): qty 5

## 2012-06-13 MED ORDER — MAGIC MOUTHWASH W/LIDOCAINE
5.0000 mL | Freq: Two times a day (BID) | ORAL | Status: DC | PRN
  Filled 2012-06-13: qty 10

## 2012-06-13 MED ORDER — FLUTICASONE PROPIONATE HFA 44 MCG/ACT IN AERO
2.0000 | INHALATION_SPRAY | Freq: Two times a day (BID) | RESPIRATORY_TRACT | Status: DC
  Administered 2012-06-14 – 2012-06-15 (×2): 2 via RESPIRATORY_TRACT
  Filled 2012-06-13: qty 10.6

## 2012-06-13 NOTE — ED Notes (Signed)
MD at bedside (Dr. Hurley Cisco)

## 2012-06-13 NOTE — ED Provider Notes (Signed)
History     CSN: [id number]  Arrival date & time 06/12/12  2301   First MD Initiated Contact with Patient 06/13/12 0013      Chief Complaint  Patient presents with  . Fall    (Consider location/radiation/quality/duration/timing/severity/associated sxs/prior treatment) Patient is a 61 y.o. female presenting with fall. The history is provided by the patient and the spouse.  Fall   patient here after falling on getting off of the toilet. Patient had left knee surgery 10 days ago. No loss of consciousness with her fall. Complains of pain to her left knee and left foot. Also notes left shoulder pain as well. She also complains of left wrist pain without obvious evidence of injury Denies any back pain. No severe headaches or vomiting. No midline neck tenderness. EMS was called and patient transported here.  Past Medical History  Diagnosis Date  . Hypothyroidism   . Asthma   . Chronic kidney disease     hx of kidney stones, last 6 months ago  . GERD (gastroesophageal reflux disease)   . Arthritis   . Blood clot in vein  age 2    left leg  . Anemia     Past Surgical History  Procedure Date  . Colonscopy 2011  . Double partial knee replacement 12-30-1998  . Joint replacement 05-2007    right knee replacment/revision  . Cystoscopy 2012  . Total knee revision 06/03/2012    Procedure: TOTAL KNEE REVISION;  Surgeon: Gearlean Alf, MD;  Location: [facility];  Service: Orthopedics;  Laterality: Left;  Revision of a Left Uni Knee to a Total Knee Arthroplasty    History reviewed. No pertinent family history.  History  Substance Use Topics  . Smoking status: Never Smoker   . Smokeless tobacco: Never Used  . Alcohol Use: No    OB History    Grav Para Term Preterm Abortions TAB SAB Ect Mult Living                  Review of Systems  All other systems reviewed and are negative.    Allergies  Symbicort  Home Medications   Current Outpatient Rx  Name  Route  Sig  Dispense   Refill  . OXYCODONE HCL 5 MG PO TABS   Oral   Take 5-10 mg by mouth every 3 (three) hours as needed. For pain         . CITALOPRAM HYDROBROMIDE 20 MG PO TABS   Oral   Take 20 mg by mouth every other day.          Marland Kitchen LEVOTHYROXINE SODIUM 75 MCG PO TABS   Oral   Take 75 mcg by mouth every evening. ON AN EMPTY STOMACH         . METHOCARBAMOL 500 MG PO TABS   Oral   Take 500 mg by mouth every 6 (six) hours as needed. For muscle pain         . OMEPRAZOLE MAGNESIUM 20 MG PO TBEC   Oral   Take 20 mg by mouth daily before breakfast.         . PROAIR HFA 108 (90 BASE) MCG/ACT IN AERS   Inhalation   Inhale 2 puffs into the lungs every 4 (four) hours as needed. Wheezing and shortness of breath         . PULMICORT FLEXHALER 90 MCG/ACT IN AEPB   Inhalation   Inhale 2 puffs into the lungs 2 (two) times daily.          Marland Kitchen  RIVAROXABAN 10 MG PO TABS   Oral   Take 10 mg by mouth daily with breakfast. Take Xarelto for two and a half more weeks, then discontinue Xarelto.           BP 140/74  Pulse 80  Temp 99.1 F (37.3 C) (Oral)  Resp 18  SpO2 100%  Physical Exam  Nursing note and vitals reviewed. Constitutional: She is oriented to person, place, and time. She appears well-developed and well-nourished.  Non-toxic appearance. No distress.  HENT:  Head: Normocephalic and atraumatic.  Eyes: Conjunctivae normal, EOM and lids are normal. Pupils are equal, round, and reactive to light.  Neck: Normal range of motion. Neck supple. Muscular tenderness present. No spinous process tenderness present. No tracheal deviation and normal range of motion present. No mass present.  Cardiovascular: Normal rate, regular rhythm and normal heart sounds.  Exam reveals no gallop.   No murmur heard. Pulmonary/Chest: Effort normal and breath sounds normal. No stridor. No respiratory distress. She has no decreased breath sounds. She has no wheezes. She has no rhonchi. She has no rales.    Abdominal: Soft. Normal appearance and bowel sounds are normal. She exhibits no distension. There is no tenderness. There is no rebound and no CVA tenderness.  Musculoskeletal: Normal range of motion. She exhibits no edema and no tenderness.       Left wrist with full range of motion. Radial pulse 2+. No bruising or deformities.  Left knee with intact Steri-Strips. Slight swelling noted. Limited range of motion which according to the patient is at her baseline.  Left foot with tenderness at the mid instep. No pain with range of motion of the left ankle.  Left shoulder without deformity or bruising.  Neurological: She is alert and oriented to person, place, and time. She has normal strength. No cranial nerve deficit or sensory deficit. GCS eye subscore is 4. GCS verbal subscore is 5. GCS motor subscore is 6.  Skin: Skin is warm and dry. No abrasion and no rash noted.  Psychiatric: She has a normal mood and affect. Her speech is normal and behavior is normal.    ED Course  Procedures (including critical care time)  Labs Reviewed - No data to display Dg Wrist Complete Left  06/12/2012  *RADIOLOGY REPORT*  Clinical Data: Fall, pain.  LEFT WRIST - COMPLETE 3+ VIEW  Comparison:  None.  Findings:  There is no evidence of fracture or dislocation.  There is no evidence of arthropathy or other focal bone abnormality. Soft tissues are unremarkable.  IMPRESSION: Negative.   Original Report Authenticated By: Rolla Flatten, M.D.    Dg Shoulder Left  06/12/2012  *RADIOLOGY REPORT*  Clinical Data: [place] in shower, superior left shoulder pain.  LEFT SHOULDER - 2+ VIEW  Comparison:  None.  Findings:  There is no evidence of fracture or dislocation.  There is no evidence of arthropathy or other focal bone abnormality. Soft tissues are unremarkable.  IMPRESSION: Negative.   Original Report Authenticated By: Rolla Flatten, M.D.      No diagnosis found.    MDM  Patient states that she is unable to take care  of herself at home. Spoke with hospitalist and they will admit        Leota Jacobsen, MD 06/13/12 [phone]

## 2012-06-13 NOTE — ED Notes (Signed)
Patient transported to X-ray 

## 2012-06-13 NOTE — H&P (Signed)
[facility] History and Physical  Rachel [name] [id number] DOB: 07-22-1951 DOA: 06/12/2012  Referring physician: ED PCP: Janalyn Rouse, MD  Specialists: None  Chief Complaint: Fall  HPI: Rachel [name] is a 61 y.o. female who suffered a mechanical fall getting off the toilet earlier this evening.  The patient landed on her R side.  The fall occurs in the context of a recent TKA done earlier this month.  As a result of the fall the patient was taken to the ED.  No LOC with fall, pain located in L knee and L foot, also has L shoulder pain as well, L knee wound has opened up.  After fall did have some urinary incontinence but no LE weakness, numbness, nor saddle anesthesia.  In the ED multiple X-Rays obtained which thankfully showed no evidence of new fractures, however, at this point the patient and husband feel that an inpatient rehab would be best for the patient.  Hospitalist has been asked to admit since this is a weekend to hold the patient until PT/OT can see and eval patient for inpatient rehab placement on Monday.  Review of Systems: 12 systems reviewed and negative.  Past Medical History  Diagnosis Date  . Hypothyroidism   . Asthma   . Chronic kidney disease     hx of kidney stones, last 6 months ago  . GERD (gastroesophageal reflux disease)   . Arthritis   . Blood clot in vein  age 7    left leg  . Anemia    Past Surgical History  Procedure Date  . Colonscopy 2011  . Double partial knee replacement 12-30-1998  . Joint replacement 05-2007    right knee replacment/revision  . Cystoscopy 2012  . Total knee revision 06/03/2012    Procedure: TOTAL KNEE REVISION;  Surgeon: Gearlean Alf, MD;  Location: [facility];  Service: Orthopedics;  Laterality: Left;  Revision of a Left Uni Knee to a Total Knee Arthroplasty   Social History:  reports that she has never smoked. She has never used smokeless tobacco. She reports that she does not drink alcohol or use illicit  drugs. Patient lives at home.  Allergies  Allergen Reactions  . Symbicort (Budesonide-Formoterol Fumarate) Other (See Comments)    Shakes and muscle weakness    History reviewed. No pertinent family history. Husband unable to physically lift patient or take care of her at this time.  Prior to Admission medications   Medication Sig Start Date End Date Taking? Authorizing Provider  Alum & Mag Hydroxide-Simeth (MAGIC MOUTHWASH W/LIDOCAINE) SOLN Take 5-10 mLs by mouth 2 (two) times daily as needed. Swish with 5-33ml and spit twice a day as needed for throat pain   Yes Historical Provider, MD  citalopram (CELEXA) 20 MG tablet Take 20 mg by mouth every other day.  04/06/12  Yes Historical Provider, MD  levothyroxine (SYNTHROID, LEVOTHROID) 75 MCG tablet Take 75 mcg by mouth every evening. ON AN EMPTY STOMACH   Yes Historical Provider, MD  methocarbamol (ROBAXIN) 500 MG tablet Take 500 mg by mouth every 6 (six) hours as needed. For muscle pain 06/05/12  Yes Alexzandrew Dara Lords, PA  nystatin (MYCOSTATIN) 100000 UNIT/ML suspension Take 100,000 Units by mouth 2 (two) times daily. Swish with 43ml and swallow for thrush   Yes Historical Provider, MD  omeprazole (PRILOSEC OTC) 20 MG tablet Take 20 mg by mouth daily before breakfast.   Yes Historical Provider, MD  oxyCODONE (OXY IR/ROXICODONE) 5 MG immediate release tablet Take  5-10 mg by mouth every 3 (three) hours as needed. For pain 06/05/12  Yes Alexzandrew Dara Lords, PA  PROAIR HFA 108 (90 BASE) MCG/ACT inhaler Inhale 2 puffs into the lungs every 4 (four) hours as needed. Wheezing and shortness of breath 04/19/12  Yes Historical Provider, MD  PULMICORT FLEXHALER 90 MCG/ACT inhaler Inhale 2 puffs into the lungs 2 (two) times daily.  04/17/12  Yes Historical Provider, MD  rivaroxaban (XARELTO) 10 MG TABS tablet Take 10 mg by mouth daily with breakfast. Take Xarelto for two and a half more weeks, then discontinue Xarelto. 06/05/12  Yes Alexzandrew Dara Lords, PA    Physical Exam: Filed Vitals:   06/12/12 2309  BP: 140/74  Pulse: 80  Temp: 99.1 F (37.3 C)  TempSrc: Oral  Resp: 18  SpO2: 100%    General:  NAD, resting comfortably in bed Eyes: PEERLA EOMI ENT: mucous membranes moist Neck: supple w/o JVD Cardiovascular: RRR w/o MRG Respiratory: CTA B Abdomen: soft, nt, nd, bs+ Skin: no rash nor lesion Musculoskeletal: MAE, small amount of bleeding from surgical wound on LLE Psychiatric: normal tone and affect Neurologic: AAOx3, grossly non-focal, able to move both feet, no evidence of numbness nor weakness of either LE  Labs on Admission:  Basic Metabolic Panel:  Lab [id number] 0140  NA 135  K 3.2*  CL 97  CO2 26  GLUCOSE 98  BUN 10  CREATININE 0.83  CALCIUM 9.1  MG --  PHOS --   Liver Function Tests: No results found for this basename: AST:5,ALT:5,ALKPHOS:5,BILITOT:5,PROT:5,ALBUMIN:5 in the last 168 hours No results found for this basename: LIPASE:5,AMYLASE:5 in the last 168 hours No results found for this basename: AMMONIA:5 in the last 168 hours CBC:  Lab 06/13/12 0140 06/06/12 0511  WBC 8.6 9.8  NEUTROABS 5.8 --  HGB 10.2* 9.4*  HCT 30.2* 29.4*  MCV 87.5 91.0  PLT 573* 255   Cardiac Enzymes: No results found for this basename: CKTOTAL:5,CKMB:5,CKMBINDEX:5,TROPONINI:5 in the last 168 hours  BNP (last 3 results) No results found for this basename: PROBNP:3 in the last 8760 hours CBG: No results found for this basename: GLUCAP:5 in the last 168 hours  Radiological Exams on Admission: Dg Wrist Complete Left  06/12/2012  *RADIOLOGY REPORT*  Clinical Data: Fall, pain.  LEFT WRIST - COMPLETE 3+ VIEW  Comparison:  None.  Findings:  There is no evidence of fracture or dislocation.  There is no evidence of arthropathy or other focal bone abnormality. Soft tissues are unremarkable.  IMPRESSION: Negative.   Original Report Authenticated By: Rolla Flatten, M.D.    Dg Shoulder Left  06/12/2012  *RADIOLOGY REPORT*  Clinical  Data: [place] in shower, superior left shoulder pain.  LEFT SHOULDER - 2+ VIEW  Comparison:  None.  Findings:  There is no evidence of fracture or dislocation.  There is no evidence of arthropathy or other focal bone abnormality. Soft tissues are unremarkable.  IMPRESSION: Negative.   Original Report Authenticated By: Rolla Flatten, M.D.    Dg Foot Complete Left  06/13/2012  *RADIOLOGY REPORT*  Clinical Data: Fall, medial left foot pain.  LEFT FOOT - COMPLETE 3+ VIEW  Comparison: None.  Findings: Suboptimal positioning.  Per the tech note, the patient is in a knee immobilizer and the submitted images are the best obtainable at this time.   Within this limitation, diffuse osteopenia.  No displaced fracture or dislocation.  No aggressive osseous lesion.  Plantar and posterior calcaneal enthesopathic changes.  Mild midfoot DJD.  IMPRESSION: No acute osseous  abnormality identified.  Given the limitations, recommend a low threshold to obtain short term follow up imaging if clinical symptoms persist.   Original Report Authenticated By: Carlos Levering, M.D.     EKG: Independently reviewed.  Assessment/Plan Principal Problem:  *Fall Active Problems:  Urinary incontinence   1. Fall - will admit patient to obs for PT/OT eval and placement in rehab on Monday. 2. Urinary incontinence - onset after fall, unclear etiology, no further evidence of neurologic compromise at this time, certainly there is no evidence of a central cord syndrome, will need to monitor this closely and evaluate further if persists.  No evidence of seizure at time of fall.  No consults called  Code Status: Full Code (must indicate code status--if unknown or must be presumed, indicate so) Family Communication: Spoke with patient and husband who is at bedside (indicate person spoken with, if applicable, with phone number if by telephone) Disposition Plan: Admit to obs (indicate anticipated LOS)  Time spent: 30 min  Lambert Jeanty  M. [facility] Pager [phone]  If 7PM-7AM, please contact night-coverage [email or web address] Password [email or web address] 06/13/2012, 2:57 AM

## 2012-06-13 NOTE — Evaluation (Signed)
Physical Therapy Evaluation Patient Details Name: Rachel [name] MRN: [id number] DOB: 1951-02-26 Today's Date: 06/13/2012 Time: [id number] PT Time Calculation (min): 20 min  PT Assessment / Plan / Recommendation Clinical Impression  61 y.o. female with recent L TKA 06/03/12 with DC home 06/06/12 who fell in bathroom at home, incision of L TKA opened. Pt plans to DC to ST-SNF. Pt reports increased frequency of urination, as well as sores on her lip and back. She ambulated 54' with RW, distance limited by L thigh pain and L hand pain. Ther ex tolerance also limited by L thigh pain. Manual stretching for L knee flexion deferred 2* active bleeding of incision. Pt would benefit from acute PT to address decreased activity tolerance, pain, decreased L knee strength/ROM.     PT Assessment  Patient needs continued PT services    Follow Up Recommendations  SNF;Supervision for mobility/OOB    Does the patient have the potential to tolerate intense rehabilitation      Barriers to Discharge        Equipment Recommendations  None recommended by PT    Recommendations for Other Services OT consult   Frequency 7X/week    Precautions / Restrictions Precautions Precautions: Knee Restrictions LLE Weight Bearing: Weight bearing as tolerated   Pertinent Vitals/Pain **10/10 L thigh and 5/10  L hand pain with walking with RW Ice applied to L knee*      Mobility  Bed Mobility Bed Mobility: Supine to Sit Supine to Sit: 5: Supervision Transfers Sit to Stand: With upper extremity assist;5: Supervision;From bed Stand to Sit: With upper extremity assist;To chair/3-in-1;5: Supervision Ambulation/Gait Ambulation/Gait Assistance: 5: Supervision Ambulation Distance (Feet): 45 Feet Assistive device: Rolling walker Gait Pattern: Step-through pattern General Gait Details: distance limited by L thigh pain (pt states this is from the touniquet during surgery) and by L hand pain with use of RW    Shoulder  Instructions     Exercises Total Joint Exercises Ankle Circles/Pumps: AROM;Both;15 reps Quad Sets: AROM;Both;10 reps Short Arc Quad: AROM;Left;10 reps Hip ABduction/ADduction: Left;10 reps;AAROM   PT Diagnosis: Difficulty walking;Acute pain  PT Problem List: Decreased strength;Decreased range of motion;Decreased activity tolerance;Pain PT Treatment Interventions: Gait training;Functional mobility training;Therapeutic activities;Therapeutic exercise;Patient/family education   PT Goals Acute Rehab PT Goals PT Goal Formulation: With patient Time For Goal Achievement: 06/17/12 Potential to Achieve Goals: Good Pt will go Sit to Stand: with modified independence PT Goal: Sit to Stand - Progress: Goal set today Pt will go Stand to Sit: with modified independence PT Goal: Stand to Sit - Progress: Goal set today Pt will Ambulate: >150 feet;with supervision;with rolling walker PT Goal: Ambulate - Progress: Goal set today Pt will Perform Home Exercise Program: with supervision, verbal cues required/provided PT Goal: Perform Home Exercise Program - Progress: Goal set today  Visit Information  Last PT Received On: 06/13/12 Assistance Needed: +1    Subjective Data  Subjective: I have these bumps on my back that bother me. My left hand hurts with the walker. I landed on it when I fell.  Patient Stated Goal: go to ST-SNF for rehab   Prior [place] Lives With: Spouse Available Help at Discharge: [place] Type of Home: House Home Access: Stairs to enter Technical brewer of Steps: 1 Entrance Stairs-Rails: None Home Layout: One level Bathroom Shower/Tub: Multimedia programmer: Standard Home Adaptive Equipment: Environmental consultant - rolling;Reacher;Sock aid Prior Function Level of Independence: Independent with assistive device(s) Communication Communication: No difficulties  Cognition  Overall Cognitive Status: Appears within functional limits for  tasks assessed/performed Arousal/Alertness: Awake/alert Orientation Level: Appears intact for tasks assessed Behavior During Session: [facility] for tasks performed    Extremity/Trunk Assessment Right Upper Extremity Assessment RUE ROM/Strength/Tone: [facility] for tasks assessed Left Upper Extremity Assessment LUE ROM/Strength/Tone: Deficits LUE ROM/Strength/Tone Deficits: pain in L hand with use of RW Right Lower Extremity Assessment RLE ROM/Strength/Tone: Within functional levels RLE Sensation: WFL - Light Touch RLE Coordination: WFL - gross/fine motor Left Lower Extremity Assessment LLE ROM/Strength/Tone: Deficits;Unable to fully assess;Due to pain LLE ROM/Strength/Tone Deficits: knee flexion approx. 60* AROM, knee ext at least 3/5 LLE Sensation: WFL - Light Touch LLE Coordination: WFL - gross/fine motor Trunk Assessment Trunk Assessment: Normal   Balance    End of Session PT - End of Session Equipment Utilized During Treatment: Gait belt Activity Tolerance: Patient limited by pain Patient left: in chair;with call bell/phone within reach Nurse Communication: Mobility status  GP     Blondell Reveal Kistler 06/13/2012, 2:20 PM  [phone]

## 2012-06-14 LAB — BASIC METABOLIC PANEL
CO2: 27 mEq/L (ref 19–32)
Calcium: 8.8 mg/dL (ref 8.4–10.5)
Creatinine, Ser: 0.69 mg/dL (ref 0.50–1.10)
GFR calc Af Amer: >90 mL/min (ref >90)
GFR calc non Af Amer: >90 mL/min (ref >90)
Sodium: 138 mEq/L (ref 135–145)

## 2012-06-14 LAB — CBC
MCH: 28.6 pg (ref 26.0–34.0)
MCHC: 31.8 g/dL (ref 30.0–36.0)
MCV: 89.9 fL (ref 78.0–100.0)
Platelets: 512 10*3/uL — ABNORMAL HIGH (ref 150–400)
RBC: 3.46 MIL/uL — ABNORMAL LOW (ref 3.87–5.11)
RDW: 14.1 % (ref 11.5–15.5)

## 2012-06-14 MED ORDER — CIPROFLOXACIN HCL 500 MG PO TABS
500.0000 mg | ORAL_TABLET | Freq: Two times a day (BID) | ORAL | Status: DC
  Administered 2012-06-14 – 2012-06-15 (×2): 500 mg via ORAL
  Filled 2012-06-14 (×4): qty 1

## 2012-06-14 MED ORDER — DIPHENHYDRAMINE-ZINC ACETATE 2-0.1 % EX CREA
TOPICAL_CREAM | Freq: Two times a day (BID) | CUTANEOUS | Status: DC | PRN
  Administered 2012-06-14: 06:00:00 via TOPICAL
  Administered 2012-06-14: 1 via TOPICAL
  Filled 2012-06-14: qty 28

## 2012-06-14 MED ORDER — CEPHALEXIN 250 MG PO CAPS: 250.0000 mg | ORAL_CAPSULE | Freq: Three times a day (TID) | ORAL | Status: DC

## 2012-06-14 MED ORDER — CEPHALEXIN 250 MG PO CAPS
250.0000 mg | ORAL_CAPSULE | Freq: Three times a day (TID) | ORAL | Status: DC
  Administered 2012-06-14 – 2012-06-15 (×4): 250 mg via ORAL
  Filled 2012-06-14 (×5): qty 1

## 2012-06-14 MED ORDER — CIPROFLOXACIN HCL 500 MG PO TABS: 500.0000 mg | ORAL_TABLET | Freq: Two times a day (BID) | ORAL | Status: DC

## 2012-06-14 NOTE — Progress Notes (Signed)
Pt stated she was on keflex and cipro pre-admission & that those had not been restarted. Information collected via [facility] and by husband from pt's pharmacy. Dr Ardeth Perfect notified by phone & orders received. Brayln Duque, [employer]

## 2012-06-14 NOTE — Progress Notes (Signed)
Clinical Social Work Department CLINICAL SOCIAL WORK PLACEMENT NOTE 06/14/2012  Patient:  Rachel [name], Rachel [name]  Account Number:  [id number] Admit date:  06/12/2012  Clinical Social Worker:  Pete Pelt, CLINICAL SOCIAL WORKER  Date/time:  06/14/2012 07:33 PM  Clinical Social Work is seeking post-discharge placement for this patient at the following level of care:   SKILLED NURSING   (*CSW will update this form in Epic as items are completed)   06/14/2012  Patient/family provided with [place] [facility] Department of Clinical Social Work's list of facilities offering this level of care within the geographic area requested by the patient (or if unable, by the patient's family).  06/14/2012  Patient/family informed of their freedom to choose among providers that offer the needed level of care, that participate in Medicare, Medicaid or managed care program needed by the patient, have an available bed and are willing to accept the patient.  06/14/2012  Patient/family informed of [facility]' ownership interest in [facility], as well as of the fact that they are under no obligation to receive care at this facility.  PASARR submitted to [employer] on  PASARR number received from [place] on   FL2 transmitted to all facilities in geographic area requested by pt/family on  06/14/2012 FL2 transmitted to all facilities within larger geographic area on 06/14/2012  Patient informed that his/her managed care company has contracts with or will negotiate with  certain facilities, including the following:     Patient/family informed of bed offers received:   Patient chooses bed at  Physician recommends and patient chooses bed at    Patient to be transferred to  on   Patient to be transferred to facility by   The following physician request were entered in Epic:   Additional Comments:

## 2012-06-14 NOTE — Evaluation (Signed)
Occupational Therapy Evaluation Patient Details Name: Rachel [name] MRN: [id number] DOB: Nov 01, 1950 Today's Date: 06/14/2012 Time: [id number] OT Time Calculation (min): 15 min  OT Assessment / Plan / Recommendation Clinical Impression  61 yo female s/p fall with recent Lt TKA that coudl benefit from skilled OT acutely. Recommend SNF for d/c planning    OT Assessment  Patient needs continued OT Services    Follow Up Recommendations  SNF    Barriers to Discharge      Equipment Recommendations  Rolling walker with 5" wheels;3 in 1 bedside comode    Recommendations for Other Services    Frequency  Min 2X/week    Precautions / Restrictions Precautions Precautions: Knee Restrictions Weight Bearing Restrictions: No LLE Weight Bearing: Weight bearing as tolerated   Pertinent Vitals/Pain Minimal  Ice pack applied at end of session No c/o wrist pain and able to use RW without deficits    ADL  Eating/Feeding: Independent Where Assessed - Eating/Feeding: Chair Grooming: Wash/dry hands;Wash/dry face;Modified independent Where Assessed - Grooming: Unsupported standing Lower Body Bathing: Independent Where Assessed - Lower Body Bathing: Unsupported sitting (pt is able to touch toes on bil LE sitting ) Toilet Transfer: Copy Method: Sit to Loss adjuster, chartered: Regular height toilet;Grab bars Tub/Shower Transfer: Nurse, learning disability Method: Therapist, art: Walk in Engineer, site Used: Rolling walker;Gait belt Transfers/Ambulation Related to ADLs: pt ambulating Supervision levle with RW ADL Comments: pt described fall at home as small tight space that requires RW to transfer side ways. Pt completed toileting pulled pants half way up and then started side stepping with RW to wider space and lost balance falling. Pt educated on clothing management with toilet transfer and RW safety with surface, small space  and side stepping transfers. Pt demonstrates some anxiety with transferes due to recent fall. Pt coudl benefit from OT to help increase balance. Pt progressing rapidly and recommend d/c snf per pt request    OT Diagnosis: Generalized weakness  OT Problem List: Decreased strength;Decreased activity tolerance;Impaired balance (sitting and/or standing);Decreased safety awareness;Decreased knowledge of use of DME or AE;Decreased knowledge of precautions OT Treatment Interventions: Self-care/ADL training;DME and/or AE instruction;Therapeutic activities;Patient/family education;Balance training   OT Goals Acute Rehab OT Goals OT Goal Formulation: With patient Time For Goal Achievement: 06/28/12 Potential to Achieve Goals: Good ADL Goals Pt Will Transfer to Toilet: with modified independence;Ambulation;3-in-1 ADL Goal: Toilet Transfer - Progress: Goal set today Pt Will Perform Toileting - Clothing Manipulation: with modified independence;Standing ADL Goal: Toileting - Clothing Manipulation - Progress: Goal set today Pt Will Perform Tub/Shower Transfer: with set-up;Ambulation ADL Goal: Tub/Shower Transfer - Progress: Goal set today  Visit Information  Last OT Received On: 06/14/12 Assistance Needed: +1    Subjective Data  Subjective: "I guess my husband wont give me any privacy now after this"- pt laughed nervously Patient Stated Goal: to go to SNF then return home   Prior [place] Lives With: Spouse Available Help at Discharge: [place] Prior Function Level of Independence: Independent with assistive device(s) Driving: No Comments: Pt with fall at home and readmission with pending d/c SNF. Communication Communication: No difficulties Dominant Hand: Right         Vision/Perception     Cognition  Overall Cognitive Status: Appears within functional limits for tasks assessed/performed Arousal/Alertness: Awake/alert Orientation Level: Appears  intact for tasks assessed Behavior During Session: [facility] for tasks performed    Extremity/Trunk Assessment Right Upper  Extremity Assessment RUE ROM/Strength/Tone: [facility] for tasks assessed Left Upper Extremity Assessment LUE ROM/Strength/Tone: Within functional levels (no c/o of pain today and moving well) Trunk Assessment Trunk Assessment: Normal     Mobility Transfers Transfers: Sit to Stand;Stand to Sit Sit to Stand: 6: Modified independent (Device/Increase time);With upper extremity assist;From chair/3-in-1 Stand to Sit: 6: Modified independent (Device/Increase time);With upper extremity assist;To chair/3-in-1 Details for Transfer Assistance: progressing well and [facility]     Shoulder Instructions     Exercise     Balance High Level Balance High Level Balance Activites: Side stepping;Turns   End of Session OT - End of Session Activity Tolerance: Patient tolerated treatment well Patient left: in chair;with call bell/phone within reach Nurse Communication: Mobility status;Precautions  GO Functional Assessment Tool Used: clinicial judgement Functional Limitation: Self care Self Care Current Status [id number]): At least 1 percent but less than 20 percent impaired, limited or restricted Self Care Goal Status [id number]): At least 1 percent but less than 20 percent impaired, limited or restricted   Veneda Melter 06/14/2012, 11:15 AM Pager: [phone]

## 2012-06-14 NOTE — Progress Notes (Signed)
Clinical Social Work Department BRIEF PSYCHOSOCIAL ASSESSMENT 06/14/2012  Patient:  Rachel [name], Rachel [name]     Account Number:  [id number]     Admit date:  06/12/2012  Clinical Social Worker:  Pete Pelt, CLINICAL SOCIAL WORKER  Date/Time:  06/14/2012 07:27 PM  Referred by:  Physician  Date Referred:  06/14/2012 Referred for  SNF Placement   Other Referral:   Interview type:  Patient Other interview type:   Husband was at the bedside    PSYCHOSOCIAL DATA Living Status:  HUSBAND Admitted from facility:   Level of care:   Primary support name:  Weylyn Lowhorn Primary support relationship to patient:  SPOUSE Degree of support available:   Pt has a good support system with family and friends    CURRENT CONCERNS Current Concerns  Post-Acute Placement   Other Concerns:    SOCIAL WORK ASSESSMENT / PLAN CSW met with the husband and pt at the bedside. Pt was aware of rehab need and is agreeable to search in the [facility] area. Pt is unfamiliar with facilities and CSW suggested Pt and spouse seek out family and friends that may know about facilities. (Pt's husband later returned a call to [place] with 1)[facility] and 2)[facility].)   Assessment/plan status:  Information/Referral to [employer] Other assessment/ plan:   Information/referral to community resources:   CSW provided a Scientist, forensic for [employer] area and Pt/husband gave permission to send information to all of area for SNF placement.    PATIENT'S/FAMILY'S RESPONSE TO PLAN OF CARE: Pt and husband appreciative of assistance with d/c planning.      Pete Pelt, LCSWA [employer] Coverage [phone]

## 2012-06-14 NOTE — Progress Notes (Signed)
Physical Therapy Treatment Patient Details Name: Rachel [name] MRN: [id number] DOB: 21-Jun-1951 Today's Date: 06/14/2012 Time: [id number] PT Time Calculation (min): 24 min  PT Assessment / Plan / Recommendation Comments on Treatment Session  Good progress with mobility. Padded L handle of RW with washcloth 2* L hand pain, pt reported this helped.     Follow Up Recommendations  SNF;Supervision for mobility/OOB     Does the patient have the potential to tolerate intense rehabilitation     Barriers to Discharge        Equipment Recommendations  None recommended by PT    Recommendations for Other Services OT consult  Frequency 7X/week   Plan      Precautions / Restrictions Precautions Precautions: Knee Restrictions Weight Bearing Restrictions: No LLE Weight Bearing: Weight bearing as tolerated   Pertinent Vitals/Pain **5/10 L knee Ice applied, premedicated*    Mobility  Bed Mobility Sit to Supine: 7: Independent Transfers Sit to Stand: 6: Modified independent (Device/Increase time);With upper extremity assist;From chair/3-in-1 Stand to Sit: 6: Modified independent (Device/Increase time);With upper extremity assist;To bed Details for Transfer Assistance: progressing well and [facility] Ambulation/Gait Ambulation/Gait Assistance: 6: Modified independent (Device/Increase time) Ambulation Distance (Feet): 200 Feet Assistive device: Rolling walker Gait Pattern: Step-to pattern General Gait Details: steady, no LOB, good sequencing and posture    Exercises Total Joint Exercises Ankle Circles/Pumps: AROM;Both;15 reps;Supine Quad Sets: AROM;Both;10 reps;Supine Towel Squeeze: AROM;Both;15 reps;Supine Short Arc Quad: AROM;Left;15 reps;Supine Heel Slides: AAROM;Left;10 reps;Supine Straight Leg Raises: AAROM;AROM;Left;10 reps;Supine Goniometric ROM: approx. 30* AAROM   PT Diagnosis:    PT Problem List:   PT Treatment Interventions:     PT Goals Acute Rehab PT Goals PT Goal  Formulation: With patient Time For Goal Achievement: 06/17/12 Potential to Achieve Goals: Good Pt will go Sit to Stand: with modified independence PT Goal: Sit to Stand - Progress: Met Pt will go Stand to Sit: with modified independence PT Goal: Stand to Sit - Progress: Met Pt will Ambulate: >150 feet;with supervision;with rolling walker PT Goal: Ambulate - Progress: Met Pt will Perform Home Exercise Program: with supervision, verbal cues required/provided PT Goal: Perform Home Exercise Program - Progress: Progressing toward goal  Visit Information  Last PT Received On: 06/14/12 Assistance Needed: +1    Subjective Data  Subjective: My left hand is swollen but a little less painful.  Patient Stated Goal: ST-SNF   Cognition  Overall Cognitive Status: Appears within functional limits for tasks assessed/performed Arousal/Alertness: Awake/alert Orientation Level: Appears intact for tasks assessed Behavior During Session: [facility] for tasks performed    Balance  High Level Balance High Level Balance Activites: Side stepping;Turns  End of Session PT - End of Session Equipment Utilized During Treatment: Gait belt Activity Tolerance: Patient tolerated treatment well Patient left: with call bell/phone within reach;in bed Nurse Communication: Mobility status CPM Left Knee CPM Left Knee: Off   GP     Blondell Reveal Kistler 06/14/2012, 11:20 AM [phone]

## 2012-06-14 NOTE — Progress Notes (Signed)
Physician Daily Progress Note  Subjective: No complaints this AM. Worked well w/ PT yesterday. STill having some oozing at her knee from the fall.   Objective: Vital signs in last 24 hours: Temp:  [98.3 F (36.8 C)-98.7 F (37.1 C)] 98.7 F (37.1 C) (11/17 0530) Pulse Rate:  [77-84] 84  (11/17 0530) Resp:  [16-18] 16  (11/17 0530) BP: (143-154)/(62-82) 143/82 mmHg (11/17 0530) SpO2:  [95 %-99 %] 95 % (11/17 0530) Weight change:  Last BM Date: 06/13/12  CBG (last 3)  No results found for this basename: GLUCAP:3 in the last 72 hours  Intake/Output from previous day: 11/16 0701 - 11/17 0700 In: 720 [P.O.:720] Out: -  Intake/Output this shift:    Physical Exam General appearance: WF in NAD  Eyes: no scleral icterus Throat: oropharynx moist without erythema Resp: CTAB, no wheezes or rales  Cardio: RRR, no MRG  GI: soft, non-tender; bowel sounds normal; no masses,  no organomegaly Extremities: ice pack w/ bandage on L knee. Minimal blood loss noted on gauze on knee    Lab Results:  Basename 06/13/12 0140  NA 135  K 3.2*  CL 97  CO2 26  GLUCOSE 98  BUN 10  CREATININE 0.83  CALCIUM 9.1  MG --  PHOS --   No results found for this basename: AST:2,ALT:2,ALKPHOS:2,BILITOT:2,PROT:2,ALBUMIN:2 in the last 72 hours  Basename 06/13/12 0140  WBC 8.6  NEUTROABS 5.8  HGB 10.2*  HCT 30.2*  MCV 87.5  PLT 573*   Lab Results  Component Value Date   INR 1.13 06/01/2012   No results found for this basename: CKTOTAL:3,CKMB:3,CKMBINDEX:3,TROPONINI:3 in the last 72 hours No results found for this basename: TSH,T4TOTAL,FREET3,T3FREE,THYROIDAB in the last 72 hours No results found for this basename: VITAMINB12:2,FOLATE:2,FERRITIN:2,TIBC:2,IRON:2,RETICCTPCT:2 in the last 72 hours  Micro Results: No results found for this or any previous visit (from the past 240 hour(s)).  Studies/Results: Dg Wrist Complete Left  06/12/2012  *RADIOLOGY REPORT*  Clinical Data: Fall, pain.   LEFT WRIST - COMPLETE 3+ VIEW  Comparison:  None.  Findings:  There is no evidence of fracture or dislocation.  There is no evidence of arthropathy or other focal bone abnormality. Soft tissues are unremarkable.  IMPRESSION: Negative.   Original Report Authenticated By: Rolla Flatten, M.D.    Dg Shoulder Left  06/12/2012  *RADIOLOGY REPORT*  Clinical Data: [place] in shower, superior left shoulder pain.  LEFT SHOULDER - 2+ VIEW  Comparison:  None.  Findings:  There is no evidence of fracture or dislocation.  There is no evidence of arthropathy or other focal bone abnormality. Soft tissues are unremarkable.  IMPRESSION: Negative.   Original Report Authenticated By: Rolla Flatten, M.D.    Dg Foot Complete Left  06/13/2012  *RADIOLOGY REPORT*  Clinical Data: Fall, medial left foot pain.  LEFT FOOT - COMPLETE 3+ VIEW  Comparison: None.  Findings: Suboptimal positioning.  Per the tech note, the patient is in a knee immobilizer and the submitted images are the best obtainable at this time.   Within this limitation, diffuse osteopenia.  No displaced fracture or dislocation.  No aggressive osseous lesion.  Plantar and posterior calcaneal enthesopathic changes.  Mild midfoot DJD.  IMPRESSION: No acute osseous abnormality identified.  Given the limitations, recommend a low threshold to obtain short term follow up imaging if clinical symptoms persist.   Original Report Authenticated By: Carlos Levering, M.D.      Medications: Scheduled:    . citalopram  20 mg Oral QODAY  .  fluticasone  2 puff Inhalation BID  . levothyroxine  75 mcg Oral QPM  . nystatin  100,000 Units Oral BID  . omeprazole  20 mg Oral Daily  . rivaroxaban  10 mg Oral Q breakfast  . sodium chloride  3 mL Intravenous Q12H  . [DISCONTINUED] NON FORMULARY 20 mg  20 mg Mouth/Throat q morning - 10a  . [DISCONTINUED] pantoprazole  40 mg Oral QAC breakfast   Continuous:   Assessment/Plan:  Mechanical fall w/ h/o recent L knee complete revision   - cont w/ current cool compresses and gauze to L knee   - PT saw patient and rec'd SNF   - place SW consult today to work on getting to SNF tomorrow for rehab    L total knee revision   - cont w/ xarelto for ppx . Will monitor Hgb and Cr while on this medication   - oxycodone prn pain   Urinary incontinence   - resolved   PPx - xarelto and PPI  FEN - SLIV. Full diet    Dispo - to SNF Monday for rehab      DVT Prophylaxis   LOS: 2 days   Molly Savarino 06/14/2012, 7:12 AM

## 2012-06-15 DIAGNOSIS — B370 Candidal stomatitis: Secondary | ICD-10-CM | POA: Diagnosis present

## 2012-06-15 DIAGNOSIS — Z96659 Presence of unspecified artificial knee joint: Secondary | ICD-10-CM

## 2012-06-15 LAB — BASIC METABOLIC PANEL
CO2: 27 mEq/L (ref 19–32)
Chloride: 101 mEq/L (ref 96–112)
Creatinine, Ser: 0.68 mg/dL (ref 0.50–1.10)
GFR calc Af Amer: >90 mL/min (ref >90)
Sodium: 136 mEq/L (ref 135–145)

## 2012-06-15 LAB — CBC
MCV: 89.9 fL (ref 78.0–100.0)
Platelets: 483 10*3/uL — ABNORMAL HIGH (ref 150–400)
RBC: 3.35 MIL/uL — ABNORMAL LOW (ref 3.87–5.11)
RDW: 14.1 % (ref 11.5–15.5)
WBC: 7.6 10*3/uL (ref 4.0–10.5)

## 2012-06-15 MED ORDER — ACETAMINOPHEN 325 MG PO TABS
650.0000 mg | ORAL_TABLET | Freq: Four times a day (QID) | ORAL | Status: DC | PRN
  Administered 2012-06-15: 650 mg via ORAL
  Filled 2012-06-15: qty 2

## 2012-06-15 MED ORDER — ACETAMINOPHEN 325 MG PO TABS: 650.0000 mg | ORAL_TABLET | Freq: Four times a day (QID) | ORAL | Status: DC | PRN

## 2012-06-15 MED ORDER — HYDROXYZINE HCL 25 MG PO TABS: 25.0000 mg | ORAL_TABLET | Freq: Four times a day (QID) | ORAL | Status: DC | PRN

## 2012-06-15 MED ORDER — CEPHALEXIN 250 MG PO CAPS: 250.0000 mg | ORAL_CAPSULE | Freq: Three times a day (TID) | ORAL | Status: AC

## 2012-06-15 MED ORDER — HYDROXYZINE HCL 25 MG PO TABS
25.0000 mg | ORAL_TABLET | Freq: Four times a day (QID) | ORAL | Status: DC | PRN
  Administered 2012-06-15 (×2): 25 mg via ORAL
  Filled 2012-06-15: qty 1

## 2012-06-15 NOTE — Progress Notes (Signed)
[employer] has approved ST SNF placement at [facility]. Spouse will transport pt to facility today. SNF is aware of pending transfer. Pt/spouse are in agreement with d/c plan.  Werner Lean LCSW [phone]

## 2012-06-15 NOTE — Progress Notes (Signed)
Physical Therapy Treatment Patient Details Name: Rachel [name] MRN: [id number] DOB: August 02, 1950 Today's Date: 06/15/2012 Time: [id number] PT Time Calculation (min): 27 min  PT Assessment / Plan / Recommendation Comments on Treatment Session  Pt demonstartes good strength and only requires Supervision level however is limited by L knee pain and swelling.  Decrease amb distance this am 2nd MAX c/o L knee pain 8/10 and MAX c/o L knee swelling due to recent fall.  Performed TUG test (time up and go) in which pt performed @ 93 sec to complete, which indictaes a very slow gait and a VERY HIGH FALL RISK. Pt also demon some anxiety during gait and demon unsteadyness during turns and backward gait to toilet/bed.  L knee also demonstartes limited active ROM (pain/swelling) which gives her difficulty with sit to stand and stand to sit.  Given all of the above, Pt has a HIGH probability to fall again and needs ST SNF to increased her knee ROM, strength and pain control.    Follow Up Recommendations  SNF;Supervision for mobility/OOB     Does the patient have the potential to tolerate intense rehabilitation     Barriers to Discharge        Equipment Recommendations  None recommended by PT    Recommendations for Other Services    Frequency 7X/week   Plan Discharge plan remains appropriate    Precautions / Restrictions Precautions Precautions: Knee;Fall Restrictions Weight Bearing Restrictions: No LLE Weight Bearing: Weight bearing as tolerated    Pertinent Vitals/Pain C/o 8/10 L knee pain...Marland Kitchenapplied ICE C/o L thigh pain swelling...applied heat    Mobility  Bed Mobility Details for Bed Mobility Assistance: Min Guard assist to support L LE up onto bed 2nd MAX sweeling and c/o 8/10 pain Transfers Transfers: Sit to Stand;Stand to Sit Sit to Stand: 5: Supervision;From toilet;From chair/3-in-1 Stand to Sit: 5: Supervision;To chair/3-in-1;To toilet;To bed Details for Transfer Assistance: Requires  increased time 2nd MAX c/o swelling and pain L knee.  Limited L knee ROM during sit to stand to sit which increases her risk for falls.  Ambulation/Gait Ambulation/Gait Assistance: 5: Supervision Ambulation Distance (Feet): 85 Feet Assistive device: Rolling walker Ambulation/Gait Assistance Details: decreased amb distance this am 2nd MAX c/o L knee pain 8/10 and MAX c/o L knee swelling esp behind the knee joint. Pt requires increased time with decreased step length and decreased active knee flexion which causes her to L toe drag.  Performed TUG (time up and go) test in which pt performed at 93 secs.  Indicates very slow gait and HIGH FALL RISK. Gait Pattern: Step-through pattern;Decreased stance time - left;Decreased stride length Gait velocity: decreased.  TUG test performed. Pt required 93 sec which indicated HIGH FALL RISK     PT Goals                                   Progressing slowly    Visit Information  Last PT Received On: 06/15/12    Subjective Data  Subjective: My knee is so swallon Patient Stated Goal: ST SNF   Cognition       Balance  Standardized Balance Assessment Standardized Balance Assessment: Timed Up and Go Test Timed Up and Go Test TUG: Normal TUG Normal TUG (seconds): 93 which indicates a HIGH FALL RISK   End of Session PT - End of Session Equipment Utilized During Treatment: Gait belt Activity Tolerance: Patient limited by pain  Patient left: in bed;with call bell/phone within reach;with family/visitor present (ICE to L knee and heat to L thigh)  Rica Koyanagi  PTA [facility]  Acute  Rehab Pager     [phone]

## 2012-06-15 NOTE — Progress Notes (Signed)
06/13/12 1419  PT G-Codes **NOT FOR INPATIENT CLASS**  Functional Assessment Tool Used clinical judgement as seen in chart reveiw  Functional Limitation Mobility: Walking and moving around  Mobility: Walking and Moving Around Current Status [id number]) CI  Mobility: Walking and Moving Around Goal Status [id number]) CI   G- codes entered by Clide Dales, Pt as reviewed in chart with clear assessment documented by Blondell Reveal, PT

## 2012-06-15 NOTE — Progress Notes (Signed)
CSW assisting with d/c planning . Pt has chosen [facility] for [employer]. SNF is working with [employer] for prior approval. CSW will follow to assist with d/c planning needs.  Werner Lean LCSW [phone]

## 2012-06-15 NOTE — Care Management Note (Signed)
    Page 1 of 2   06/15/2012     5:04:35 PM   CARE MANAGEMENT NOTE 06/15/2012  Patient:  Rachel [name], Rachel [name]   Account Number:  [id number]  Date Initiated:  06/14/2012  Documentation initiated by:  [facility]  Subjective/Objective Assessment:   Admitted after a fall.     Action/Plan:   PT/OT evals-recommending SNF   Anticipated DC Date:  06/15/2012   Anticipated DC Plan:  SKILLED NURSING FACILITY  In-house referral  Clinical Social Worker      DC Planning Services  CM consult      [facility] Choice  NA   Choice offered to / List presented to:  NA   DME arranged  NA      DME agency  NA     [place] arranged  NA      [place] agency  NA   Status of service:  Completed, signed off Medicare Important Message given?  NO (If response is "NO", the following Medicare IM given date fields will be blank) Date Medicare IM given:   Date Additional Medicare IM given:    Discharge Disposition:  [place]  Per UR Regulation:  Reviewed for med. necessity/level of care/duration of stay  If discussed at [place] of Stay Meetings, dates discussed:    Comments:  06/15/2012 Sherrin Daisy BSN RN CCM [phone] Pt is requesting rehab in SNF as 1st choice; 2nd choice is home with [facility] services if SNF not authorized by insurance. Pt is active with [facility] services. Notified by clinical social worker that patient will be going to SNF today.   06/14/12 Received call from [place] with [facility] stating that patient was active with [facility] for HHPT prior to admission. Fuller Plan RN, BSN, CCM

## 2012-06-15 NOTE — Progress Notes (Signed)
Occupational Therapy Treatment Patient Details Name: Rachel [name] MRN: [id number] DOB: Aug 01, 1950 Today's Date: 06/15/2012 Time: [id number] OT Time Calculation (min): 14 min  OT Assessment / Plan / Recommendation Comments on Treatment Session Pt mobilizing well after recent fall.  Pt feels she may want SNF rehab due to pt's husband's disability and is inability to care for her well after this admission.    Follow Up Recommendations  SNF    Barriers to Discharge       Equipment Recommendations  None recommended by OT    Recommendations for Other Services    Frequency Min 2X/week   Plan Discharge plan remains appropriate    Precautions / Restrictions Precautions Precautions: Knee;Fall Restrictions Weight Bearing Restrictions: No LLE Weight Bearing: Weight bearing as tolerated   Pertinent Vitals/Pain Pt with pain at 3/10 in L knee.    ADL  Eating/Feeding: Independent Where Assessed - Eating/Feeding: Chair Grooming: Wash/dry hands;Wash/dry face;Modified independent Where Assessed - Grooming: Unsupported standing Lower Body Dressing: Performed;Moderate assistance Where Assessed - Lower Body Dressing: Supported sit to Lobbyist: Copy Method: Sit to Loss adjuster, chartered: Raised toilet seat with arms (or 3-in-1 over toilet);Grab bars Toileting - Clothing Manipulation and Hygiene: Supervision/safety Where Assessed - Best boy and Hygiene: Standing Equipment Used: Rolling walker;Gait belt Transfers/Ambulation Related to ADLs: pt ambulating Supervision levle with RW ADL Comments: pt described fall at home as small tight space that requires RW to transfer side ways. Pt completed toileting pulled pants half way up and then started side stepping with RW to wider space and lost balance falling. Pt educated on clothing management with toilet transfer and RW safety with surface, small space and side stepping  transfers. Pt demonstrates some anxiety with transferes due to recent fall. Pt coudl benefit from OT to help increase balance. Pt progressing rapidly and recommend d/c snf per pt request    OT Diagnosis:    OT Problem List:   OT Treatment Interventions:     OT Goals Acute Rehab OT Goals OT Goal Formulation: With patient Time For Goal Achievement: 06/28/12 Potential to Achieve Goals: Good ADL Goals Pt Will Perform Grooming: with supervision;Standing at sink ADL Goal: Grooming - Progress: Met Pt Will Transfer to Toilet: with modified independence;Ambulation;3-in-1 ADL Goal: Toilet Transfer - Progress: Progressing toward goals Pt Will Perform Toileting - Clothing Manipulation: with modified independence;Standing ADL Goal: Toileting - Clothing Manipulation - Progress: Progressing toward goals Pt Will Perform Tub/Shower Transfer: with set-up;Ambulation  Visit Information  Last OT Received On: 06/15/12 Assistance Needed: +1    Subjective Data      Prior Functioning       Cognition  Overall Cognitive Status: Appears within functional limits for tasks assessed/performed Arousal/Alertness: Awake/alert Orientation Level: Appears intact for tasks assessed Behavior During Session: [facility] for tasks performed    Mobility  Shoulder Instructions Bed Mobility Bed Mobility: Supine to Sit Supine to Sit: 5: Supervision Sit to Supine: 7: Independent Details for Bed Mobility Assistance: Min Guard assist to support L LE up onto bed 2nd MAX sweeling and c/o 8/10 pain Transfers Transfers: Sit to Stand;Stand to Sit Sit to Stand: 5: Supervision;From toilet;From chair/3-in-1 Stand to Sit: 5: Supervision;To chair/3-in-1;To toilet;To bed Details for Transfer Assistance: Requires increased time 2nd MAX c/o swelling and pain L knee.  Limited L knee ROM during sit to stand to sit which increases her risk for falls.       Exercises      Balance Standardized Balance  Assessment Standardized Balance  Assessment: Timed Up and Go Test Timed Up and Go Test TUG: Normal TUG Normal TUG (seconds): 93    End of Session OT - End of Session Activity Tolerance: Patient tolerated treatment well Patient left: in bed;with call bell/phone within reach;with family/visitor present Nurse Communication: Mobility status;Precautions  GO Functional Assessment Tool Used: clinicial judgement Functional Limitation: Self care Self Care Current Status [id number]): At least 1 percent but less than 20 percent impaired, limited or restricted Self Care Goal Status [id number]): At least 1 percent but less than 20 percent impaired, limited or restricted   Glenford Peers 06/15/2012, 11:46 AM [phone]

## 2012-06-15 NOTE — Progress Notes (Signed)
Physical Therapy Treatment Patient Details Name: Rachel [name] MRN: [id number] DOB: 09/20/1950 Today's Date: 06/15/2012 Time: [id number] PT Time Calculation (min): 25 min  PT Assessment / Plan / Recommendation Comments on Treatment Session  Pt still awaiting BC/BS Insurance approval for ST SNF.    Follow Up Recommendations  SNF     Does the patient have the potential to tolerate intense rehabilitation     Barriers to Discharge        Equipment Recommendations  None recommended by PT    Recommendations for Other Services    Frequency 7X/week   Plan Discharge plan remains appropriate    Precautions / Restrictions Precautions Precautions: Knee;Fall Restrictions Weight Bearing Restrictions: No LLE Weight Bearing: Weight bearing as tolerated    Pertinent Vitals/Pain C/o 7/10 L Knee pain ICE applied    Mobility  Bed Mobility Bed Mobility: Supine to Sit;Sitting - Scoot to Edge of Bed;Sit to Supine Supine to Sit: 5: Supervision Sitting - Scoot to Edge of Bed: 5: Supervision Sit to Supine: 5: Supervision Details for Bed Mobility Assistance: increased time  Transfers Transfers: Sit to Stand;Stand to Sit Sit to Stand: 5: Supervision;From bed;From toilet Stand to Sit: To bed;To toilet;5: Supervision Details for Transfer Assistance: increased time  Ambulation/Gait Ambulation/Gait Assistance: 5: Supervision Ambulation Distance (Feet): 65 Feet Assistive device: Rolling walker Ambulation/Gait Assistance Details: decreased amb distnace 2nd increased c/o pain and fatigue this afternoon. Gait Pattern: Step-to pattern;Decreased stance time - left;Trunk flexed Gait velocity: decreased       Visit Information  Last PT Received On: 06/15/12 Assistance Needed: +1    Subjective Data  Subjective: Well, they are sending me home   Cognition  Overall Cognitive Status: Appears within functional limits for tasks assessed/performed Arousal/Alertness: Awake/alert Orientation  Level: Appears intact for tasks assessed Behavior During Session: [facility] for tasks performed    Balance   fair to poor depending on pain level  End of Session PT - End of Session Equipment Utilized During Treatment: Gait belt Activity Tolerance: Patient limited by fatigue Patient left: in bed;with call bell/phone within reach (ICE to L knee and heat to L thigh)   Rica Koyanagi  PTA [facility]  Acute  Rehab Pager     [phone]

## 2012-06-15 NOTE — Discharge Summary (Signed)
DISCHARGE SUMMARY  Rachel [name]  MR#: [id number]  DOB:1950-10-04  Date of Admission: 06/12/2012 Date of Discharge: 06/15/2012  Attending Physician:Rachel [name],W Rachel [name]  Patient's [id number] Rachel Canary, MD  Consults:  none  Discharge Diagnoses: Principal Problem:  *Fall Active Problems:  Urinary incontinence  Thrush  S/P total knee arthroplasty  Past Medical History  Diagnosis Date  . Hypothyroidism   . Asthma   . Chronic kidney disease     hx of kidney stones, last 6 months ago  . GERD (gastroesophageal reflux disease)   . Arthritis   . Blood clot in vein  age 89    left leg  . Anemia    Past Surgical History  Procedure Date  . Colonscopy 2011  . Double partial knee replacement 12-30-1998  . Joint replacement 05-2007    right knee replacment/revision  . Cystoscopy 2012  . Total knee revision 06/03/2012    Procedure: TOTAL KNEE REVISION;  Surgeon: Gearlean Alf, MD;  Location: [facility];  Service: Orthopedics;  Laterality: Left;  Revision of a Left Uni Knee to a Total Knee Arthroplasty    Discharge Medications:   Medication List     As of 06/15/2012  7:20 AM    TAKE these medications         cephALEXin 250 MG capsule   Commonly known as: KEFLEX   Take 1 capsule (250 mg total) by mouth 3 (three) times daily with meals.      citalopram 20 MG tablet   Commonly known as: CELEXA   Take 20 mg by mouth every other day.      hydrOXYzine 25 MG tablet   Commonly known as: ATARAX/VISTARIL   Take 1 tablet (25 mg total) by mouth every 6 (six) hours as needed for itching.      levothyroxine 75 MCG tablet   Commonly known as: SYNTHROID, LEVOTHROID   Take 75 mcg by mouth every evening. ON AN EMPTY STOMACH      magic mouthwash w/lidocaine Soln   Take 5-10 mLs by mouth 2 (two) times daily as needed. Swish with 5-79ml and spit twice a day as needed for throat pain      methocarbamol 500 MG tablet   Commonly known as: ROBAXIN   Take 500 mg by mouth every 6 (six) hours as  needed. For muscle pain      nystatin 100000 UNIT/ML suspension   Commonly known as: MYCOSTATIN   Take 100,000 Units by mouth 2 (two) times daily. Swish with 77ml and swallow for thrush      omeprazole 20 MG tablet   Commonly known as: PRILOSEC OTC   Take 20 mg by mouth daily before breakfast.      oxyCODONE 5 MG immediate release tablet   Commonly known as: Oxy IR/ROXICODONE   Take 5-10 mg by mouth every 3 (three) hours as needed. For pain      PROAIR HFA 108 (90 BASE) MCG/ACT inhaler   Generic drug: albuterol   Inhale 2 puffs into the lungs every 4 (four) hours as needed. Wheezing and shortness of breath      PULMICORT FLEXHALER 90 MCG/ACT inhaler   Generic drug: Budesonide   Inhale 2 puffs into the lungs 2 (two) times daily.      rivaroxaban 10 MG Tabs tablet   Commonly known as: XARELTO   Take 10 mg by mouth daily with breakfast. Take Xarelto for two and a half more weeks, then discontinue Xarelto.  Hospital Procedures: Dg Wrist Complete Left  06/12/2012  *RADIOLOGY REPORT*  Clinical Data: Fall, pain.  LEFT WRIST - COMPLETE 3+ VIEW  Comparison:  None.  Findings:  There is no evidence of fracture or dislocation.  There is no evidence of arthropathy or other focal bone abnormality. Soft tissues are unremarkable.  IMPRESSION: Negative.   Original Report Authenticated By: Rolla Flatten, M.D.    Dg Shoulder Left  06/12/2012  *RADIOLOGY REPORT*  Clinical Data: [place] in shower, superior left shoulder pain.  LEFT SHOULDER - 2+ VIEW  Comparison:  None.  Findings:  There is no evidence of fracture or dislocation.  There is no evidence of arthropathy or other focal bone abnormality. Soft tissues are unremarkable.  IMPRESSION: Negative.   Original Report Authenticated By: Rolla Flatten, M.D.    Dg Foot Complete Left  06/13/2012  *RADIOLOGY REPORT*  Clinical Data: Fall, medial left foot pain.  LEFT FOOT - COMPLETE 3+ VIEW  Comparison: None.  Findings: Suboptimal positioning.  Per the  tech note, the patient is in a knee immobilizer and the submitted images are the best obtainable at this time.   Within this limitation, diffuse osteopenia.  No displaced fracture or dislocation.  No aggressive osseous lesion.  Plantar and posterior calcaneal enthesopathic changes.  Mild midfoot DJD.  IMPRESSION: No acute osseous abnormality identified.  Given the limitations, recommend a low threshold to obtain short term follow up imaging if clinical symptoms persist.   Original Report Authenticated By: Carlos Levering, M.D.     History of Present Illness: Rachel [name] is a 61 year old white female with a history of asthma, and osteoarthritis who underwent a right total knee revision on 11/6 by Dr. Maureen Ralphs. She tolerated the procedure well and was discharged home on 11/9. She go to the weekend do to increased weakness and urinary frequency was placed on Cipro for possible UTI. She was seen in my office several 3 days later for increased mouth ulcerations. This was treated with nystatin for presumed thrush. She saw Dr. Maureen Ralphs later that day complaining of increased left knee redness and swelling. He placed her on Keflex though she states he did not feel that there was any signs of infection.  2 days later, she states she was sitting on the toilet when she stood up and fell.  Her husband felt that he was unable to care for her at home anymore and brought her to the emergency room for admission for skilled nursing placement.  In the emergency department she had x-rays of her shoulder, foot, and wrist that showed no acute findings.  Hospital Course: Patient was initially admitted to the hospitalist service. She was subsequently transferred to our service following day. Her hospital course has been uneventful. She has participated with physical therapy who feel that skilled nursing is appropriate. Her only other complaint has been some pruritus which may be related to her antibiotics or her pain medications. She  states that she has a paradoxical response to Benadryl. She'll be prescribed Atarax in addition to Benadryl cream as needed. May need to consider alternative antibiotics if this persists. She is stable for discharge to skilled nursing pending bed availability.  Day of Discharge Exam BP 143/79  Pulse 74  Temp 98.3 F (36.8 C) (Oral)  Resp 16  Ht 5\' 6"  (1.676 m)  Wt 105.688 kg (233 lb)  BMI 37.61 kg/m2  SpO2 97%  Physical Exam: General appearance: No acute distress Eyes: no scleral icterus Throat: oropharynx moist without erythema  Resp: Clear to auscultation bilaterally  Cardio: Regular rate and rhythm without murmurs rubs or gallop GI: soft, non-tender; bowel sounds normal; no masses,  no organomegaly Extremities: Left knee effusion with minimal blood drainage on the dressing Skin: Multiple papules excoriated along her back  Discharge Labs:  [facility] 06/15/12 0505 06/14/12 0805  NA 136 138  K 3.7 3.5  CL 101 102  CO2 27 27  GLUCOSE 92 119*  BUN 12 10  CREATININE 0.68 0.69  CALCIUM 9.0 8.8  MG -- --  PHOS -- --   No results found for this basename: AST:2,ALT:2,ALKPHOS:2,BILITOT:2,PROT:2,ALBUMIN:2 in the last 72 hours  Basename 06/15/12 0505 06/14/12 0805 06/13/12 0140  WBC 7.6 7.8 --  NEUTROABS -- -- 5.8  HGB 9.5* 9.9* --  HCT 30.1* 31.1* --  MCV 89.9 89.9 --  PLT 483* 512* --   Lab Results  Component Value Date   INR 1.13 06/01/2012   No results found for this basename: CKTOTAL:3,CKMB:3,CKMBINDEX:3,TROPONINI:3 in the last 72 hours No results found for this basename: TSH,T4TOTAL,FREET3,T3FREE,THYROIDAB in the last 72 hours No results found for this basename: VITAMINB12:2,FOLATE:2,FERRITIN:2,TIBC:2,IRON:2,RETICCTPCT:2 in the last 72 hours  Discharge instructions:     Discharge Orders    Future Orders Please Complete By Expires   Diet - low sodium heart healthy      Increase activity slowly      Discharge instructions      Comments:   Continued physical  therapy/occupational therapy. Change dressing daily.      Disposition: To skilled nursing facility pending bed available  Follow-up Appts: Follow-up with Dr. Brigitte Pulse at [facility] one week after discharge from skilled nursing. Followup with Dr. Maureen Ralphs at [facility] as scheduled later this week.  Call to verify appointment.  Condition on Discharge: stable  Tests Needing Follow-up: None   Signed: Dallan Schonberg,W Rachel [name] 06/15/2012, 7:20 AM

## 2012-06-16 NOTE — Progress Notes (Signed)
Clinical Social Work Department CLINICAL SOCIAL WORK PLACEMENT NOTE 06/16/2012  Patient:  Rachel [name], Rachel [name]  Account Number:  [id number] Admit date:  06/12/2012  Clinical Social Worker:  Pete Pelt, CLINICAL SOCIAL WORKER  Date/time:  06/14/2012 07:33 PM  Clinical Social Work is seeking post-discharge placement for this patient at the following level of care:   SKILLED NURSING   (*CSW will update this form in Epic as items are completed)   06/14/2012  Patient/family provided with [place] Department of Clinical Social Work's list of facilities offering this level of care within the geographic area requested by the patient (or if unable, by the patient's family).  06/14/2012  Patient/family informed of their freedom to choose among providers that offer the needed level of care, that participate in Medicare, Medicaid or managed care program needed by the patient, have an available bed and are willing to accept the patient.  06/14/2012  Patient/family informed of [facility]' ownership interest in [facility], as well as of the fact that they are under no obligation to receive care at this facility.  PASARR submitted to EDS on 06/15/2012 PASARR number received from [employer] on 06/15/2012  FL2 transmitted to all facilities in geographic area requested by pt/family on  06/14/2012 FL2 transmitted to all facilities within larger geographic area on 06/14/2012  Patient informed that his/her managed care company has contracts with or will negotiate with  certain facilities, including the following:     Patient/family informed of bed offers received:  06/15/2012 Patient chooses bed at [place] Physician recommends and patient chooses bed at    Patient to be transferred to [place] on  06/15/2012 Patient to be transferred to facility by [facility]  The following physician request were entered in Epic:   Additional Comments:  [employer] provided prior approval for ST  SNF placement at [facility].  Werner Lean LCSW [phone]

## 2012-11-16 ENCOUNTER — Other Ambulatory Visit: Payer: Self-pay

## 2012-11-16 DIAGNOSIS — Z1231 Encounter for screening mammogram for malignant neoplasm of breast: Secondary | ICD-10-CM

## 2012-12-11 ENCOUNTER — Ambulatory Visit
Admission: RE | Admit: 2012-12-11 | Discharge: 2012-12-11 | Disposition: A | Discharge status: Home / Self Care | Payer: BC Managed Care – PPO | Source: Ambulatory Visit

## 2012-12-11 DIAGNOSIS — Z1231 Encounter for screening mammogram for malignant neoplasm of breast: Secondary | ICD-10-CM

## 2013-01-08 ENCOUNTER — Ambulatory Visit (INDEPENDENT_AMBULATORY_CARE_PROVIDER_SITE_OTHER): Payer: BC Managed Care – PPO | Admitting: Certified Nurse Midwife

## 2013-01-08 ENCOUNTER — Encounter: Payer: Self-pay | Admitting: Certified Nurse Midwife

## 2013-01-08 VITALS — BP 124/78 | HR 68 | Resp 16 | Ht 64.75 in | Wt 217.0 lb

## 2013-01-08 DIAGNOSIS — Z01419 Encounter for gynecological examination (general) (routine) without abnormal findings: Secondary | ICD-10-CM

## 2013-01-08 NOTE — Progress Notes (Signed)
62 y.o. No obstetric history on file. Married Caucasian Fe here for annual exam. Menopausal no HRT. Denies vaginal bleeding or vaginal dryness. Had knee replacement in 2013, doing well.  Sees PCP for medication management for thyroid and all labs and aex. No health issues today     No LMP recorded. Patient is postmenopausal.          Sexually active: no  The current method of family planning is abstinence.    Exercising: yes  walking some Smoker:  no  Health Maintenance: Pap: 01/03/12 neg HPV HR neg MMG:  12-11-12 normal Colonoscopy:  2010 negative 10 years BMD:   osteopenia TDaP:  Unsure per patient but within last 10 years Labs: not done Self breast exam: done occ   reports that she has never smoked. She has never used smokeless tobacco. She reports that she does not drink alcohol or use illicit drugs.  Past Medical History  Diagnosis Date  . Hypothyroidism   . Asthma   . Chronic kidney disease     hx of kidney stones, last 6 months ago  . Arthritis   . Blood clot in vein  age 8    left leg  . Anemia   . Multiple pregnancy loss, not currently pregnant     multiple miscarriages-pt.unsure of #  . History of kidney stones   . GERD (gastroesophageal reflux disease)   . Osteopenia     Past Surgical History  Procedure Laterality Date  . Colonscopy  2011  . Double partial knee replacement  12-30-1998  . Joint replacement  05-2007    right knee replacment/revision  . Cystoscopy  2012  . Total knee revision  06/03/2012    Procedure: TOTAL KNEE REVISION;  Surgeon: Gearlean Alf, MD;  Location: [facility];  Service: Orthopedics;  Laterality: Left;  Revision of a Left Uni Knee to a Total Knee Arthroplasty  . Breast surgery      rt. lumpectomy  . Replacement total knee Left 06/05/12    Current Outpatient Prescriptions  Medication Sig Dispense Refill  . acetaminophen (TYLENOL) 325 MG tablet Take 2 tablets (650 mg total) by mouth every 6 (six) hours as needed.      . citalopram  (CELEXA) 20 MG tablet Take 20 mg by mouth every other day.       . levothyroxine (SYNTHROID, LEVOTHROID) 75 MCG tablet Take 75 mcg by mouth every evening. ON AN EMPTY STOMACH      . omeprazole (PRILOSEC OTC) 20 MG tablet Take 20 mg by mouth daily before breakfast.      . PROAIR HFA 108 (90 BASE) MCG/ACT inhaler Inhale 2 puffs into the lungs every 4 (four) hours as needed. Wheezing and shortness of breath      . PULMICORT FLEXHALER 90 MCG/ACT inhaler Inhale 2 puffs into the lungs 2 (two) times daily.       . Alum & Mag Hydroxide-Simeth (MAGIC MOUTHWASH W/LIDOCAINE) SOLN Take 5-10 mLs by mouth 2 (two) times daily as needed. Swish with 5-69ml and spit twice a day as needed for throat pain      . hydrOXYzine (ATARAX/VISTARIL) 25 MG tablet Take 1 tablet (25 mg total) by mouth every 6 (six) hours as needed for itching.  30 tablet  1   No current facility-administered medications for this visit.    Family History  Problem Relation Age of Onset  . Diabetes Mother   . Heart failure Mother     CHF  . Thyroid  disease Mother   . Heart failure Father     CHF  . Breast cancer Sister     ROS:  Pertinent items are noted in HPI.  Otherwise, a comprehensive ROS was negative.  Exam:   BP 124/78  Pulse 68  Resp 16  Ht 5' 4.75" (1.645 m)  Wt 217 lb (98.431 kg)  BMI 36.37 kg/m2 Height: 5' 4.75" (164.5 cm)  Ht Readings from Last 3 Encounters:  01/08/13 5' 4.75" (1.645 m)  06/13/12 5\' 6"  (1.676 m)  06/03/12 5\' 6"  (1.676 m)    General appearance: alert, cooperative and appears stated age Head: Normocephalic, without obvious abnormality, atraumatic Neck: no adenopathy, supple, symmetrical, trachea midline and thyroid normal to inspection and palpation Lungs: clear to auscultation bilaterally Breasts: normal appearance, no masses or tenderness, Inspection negative, No nipple retraction or dimpling, No nipple discharge or bleeding, No axillary or supraclavicular adenopathy Heart: regular rate and  rhythm Abdomen: soft, non-tender; no masses,  no organomegaly Extremities: extremities normal, atraumatic, no cyanosis or edema Skin: Skin color, texture, turgor normal. No rashes or lesions Lymph nodes: Cervical, supraclavicular, and axillary nodes normal. No abnormal inguinal nodes palpated Neurologic: Grossly normal   Pelvic: External genitalia:  no lesions              Urethra:  normal appearing urethra with no masses, tenderness or lesions              Bartholin's and Skene's: normal                 Vagina: normal appearing vagina with normal color and discharge, no lesions              Cervix: normal, non tender              Pap taken: no Bimanual Exam:  Uterus:  normal size, contour, position, consistency, mobility, non-tender and anteverted              Adnexa: normal adnexa and no mass, fullness, tenderness               Rectovaginal: Confirms               Anus:  normal sphincter tone, no lesions  A:  Well Woman with normal exam  Menopausal, no HRT  Hypothyroid stable medication with PCP management    P:   Reviewed health and wellness pertinent to exam  Discussed importance of evaluation if vaginal bleeding  Continue follow up with PCP as indicated  Pap smear as per guidelines   Mammogram yearly pap smear not taken today  counseled on breast self exam, mammography screening, feminine hygiene, family planning choices, adequate intake of calcium and vitamin D, diet and exercise, Kegel's exercises  return annually or prn  An After Visit Summary was printed and given to the patient.  Reviewed, TL

## 2013-01-08 NOTE — Patient Instructions (Addendum)

## 2013-01-09 ENCOUNTER — Encounter: Payer: Self-pay | Admitting: Certified Nurse Midwife

## 2013-03-22 ENCOUNTER — Ambulatory Visit (INDEPENDENT_AMBULATORY_CARE_PROVIDER_SITE_OTHER): Payer: BC Managed Care – PPO | Admitting: Neurology

## 2013-03-22 ENCOUNTER — Encounter: Payer: Self-pay | Admitting: Neurology

## 2013-03-22 VITALS — BP 128/80 | HR 72 | Temp 98.2°F | Resp 18 | Wt 222.0 lb

## 2013-03-22 DIAGNOSIS — M62838 Other muscle spasm: Secondary | ICD-10-CM

## 2013-03-22 DIAGNOSIS — R251 Tremor, unspecified: Secondary | ICD-10-CM

## 2013-03-22 DIAGNOSIS — G250 Essential tremor: Secondary | ICD-10-CM

## 2013-03-22 DIAGNOSIS — R259 Unspecified abnormal involuntary movements: Secondary | ICD-10-CM

## 2013-03-22 NOTE — Progress Notes (Signed)
Subjective:   Rachel [name] was seen in consultation in the movement disorder clinic at the request of Janalyn Rouse, MD.  The evaluation is for tremor.  The patient is a 62 y.o. right handed female with a history of tremor.  The pt reports that she first noted shaking in the legs and arms bilaterally about 1 - 1.5 years ago.  It was intermittent.  She now notices tremor of the hands, legs and head.  She has better and worse days.  She notes that lying in the recliner helps.  She notes that walking and exercise makes it worse.  The L leg feels that it will stiffen up and cause the tremor.  There is a family hx of tremor in her mother but it was undiagnosed.    Affected by caffeine:  yes (quit drinking caffeine all together) Exacerbated by albuterol:  Yes Affected by alcohol:  unknown Affected by stress:  yes Affected by fatigue:  yes Spills soup if on spoon:  yes Spills glass of liquid if full:  yes Affects ADL's (tying shoes, brushing teeth, etc):  no Affects ability to put on makeup:  yes  Current/Previously tried tremor medications: tried propranolol on one occasion but seemed to make her sick and she did not want to take the next pill  Current medications that may exacerbate tremor:  proair  Outside reports reviewed: historical medical records.  Allergies  Allergen Reactions  . Symbicort [Budesonide-Formoterol Fumarate] Other (See Comments)    Shakes and muscle weakness    Current Outpatient Prescriptions on File Prior to Visit  Medication Sig Dispense Refill  . acetaminophen (TYLENOL) 325 MG tablet Take 2 tablets (650 mg total) by mouth every 6 (six) hours as needed.      . citalopram (CELEXA) 20 MG tablet Take 20 mg by mouth every other day.       . levothyroxine (SYNTHROID, LEVOTHROID) 75 MCG tablet Take 75 mcg by mouth every evening. ON AN EMPTY STOMACH      . omeprazole (PRILOSEC OTC) 20 MG tablet Take 20 mg by mouth daily before breakfast.      . PROAIR HFA 108 (90  BASE) MCG/ACT inhaler Inhale 2 puffs into the lungs every 4 (four) hours as needed. Wheezing and shortness of breath      . PULMICORT FLEXHALER 90 MCG/ACT inhaler Inhale 2 puffs into the lungs 2 (two) times daily.        No current facility-administered medications on file prior to visit.    Past Medical History  Diagnosis Date  . Hypothyroidism   . Asthma   . Chronic kidney disease     hx of kidney stones, last 6 months ago  . Arthritis   . Blood clot in vein  age 89    left leg  . Anemia   . Multiple pregnancy loss, not currently pregnant     multiple miscarriages-pt.unsure of #  . History of kidney stones   . GERD (gastroesophageal reflux disease)   . Osteopenia     Past Surgical History  Procedure Laterality Date  . Colonscopy  2011  . Double partial knee replacement  12-30-1998  . Joint replacement  05-2007    right knee replacment/revision  . Cystoscopy  2012  . Total knee revision  06/03/2012    Procedure: TOTAL KNEE REVISION;  Surgeon: Gearlean Alf, MD;  Location: [facility];  Service: Orthopedics;  Laterality: Left;  Revision of a Left Uni Knee to a Total Knee  Arthroplasty  . Breast surgery      rt. lumpectomy  . Replacement total knee Left 06/05/12    History   Social History  . Marital Status: Married    Spouse Name: N/A    Number of Children: N/A  . Years of Education: N/A   Occupational History  . Not on file.   Social History Main Topics  . Smoking status: Never Smoker   . Smokeless tobacco: Never Used  . Alcohol Use: No  . Drug Use: No  . Sexual Activity: Yes    Birth Control/ Protection: Post-menopausal   Other Topics Concern  . Not on file   Social History Narrative  . No narrative on file    No family status information on file.    Review of Systems A complete 10 system ROS was obtained and was negative apart from what is mentioned.   Objective:   VITALS:   Filed Vitals:   03/22/13 1301  BP: 128/80  Pulse: 72  Temp: 98.2 F  (36.8 C)  Resp: 18  Weight: 222 lb (100.699 kg)   Gen:  Appears stated age and in NAD. HEENT:  Normocephalic, atraumatic. The mucous membranes are moist. The superficial temporal arteries are without ropiness or tenderness. Cardiovascular: Regular rate and rhythm. Lungs: Clear to auscultation bilaterally. Neck: There are no carotid bruits noted bilaterally.  NEUROLOGICAL:  Orientation:  The patient is alert and oriented x 3.  Recent and remote memory are intact.  Attention span and concentration are normal.  Able to name objects and repeat without trouble.  Fund of knowledge is appropriate Cranial nerves: There is good facial symmetry. The pupils are equal round and reactive to light bilaterally. Fundoscopic exam reveals clear disc margins bilaterally. Extraocular muscles are intact and visual fields are full to confrontational testing. Speech is fluent and clear. Soft palate rises symmetrically and there is no tongue deviation. Hearing is intact to conversational tone. Tone: Tone is good throughout. Sensation: Sensation is intact to light touch and pinprick throughout (facial, trunk, extremities). Vibration is intact at the bilateral big toe. There is no extinction with double simultaneous stimulation. There is no sensory dermatomal level identified. Coordination:  The patient has no dysdiadichokinesia or dysmetria. Motor: Strength is 5/5 in the bilateral upper and lower extremities.  Shoulder shrug is equal bilaterally.  There is no pronator drift.  There are no fasciculations noted. DTR's: Deep tendon reflexes are 2/4 at the bilateral biceps, triceps, brachioradialis, right patella, 2+-3 at the left patella with cross adductor reflex and trace at the bilateral achilles.  Plantar responses are downgoing bilaterally. Gait and Station: The patient is able to ambulate without difficulty. The patient has some difficulty ambulating in a tandem fashion.  It is minor.  She is able to stand in the  Romberg position.  MOVEMENT EXAM: Tremor:  There is head titubation in the "yes" direction.  It is mild.  There is mild tremor in the UE, noted most significantly with action.  The left is worse than right She has mild tremor in the lower extremities, but not with the foot is placed flat on the floor.  When the toes are on the floor but the rest of the foot is not, there is some left lower extremity tremor.  The patient is  able to draw Archimedes spirals without significant difficulty.  There is minor trouble on the left.      Assessment/Plan:   1.  Essential Tremor.  -The patient and I  talked about pathophysiology and various treatments. She really does not want any medication.  If she changes her mind or if things that worsen the course of time, she will let me know.  -Patient education was provided.  -I. am going to go ahead and do an MRI of the brain, just because she had some asymmetric reflexes and because she feels that the left is worse than the right.  She is to call me for results.  -I will see her back as needed.

## 2013-03-22 NOTE — Patient Instructions (Addendum)
Your MRI is scheduled for Thursday, Aug. 28th at 3:00pm.  Please arrive to [facility] Entrance [facility]) by 2:45pm.  [phone]   We will call with the results.

## 2013-03-25 ENCOUNTER — Ambulatory Visit (HOSPITAL_COMMUNITY): Admission: RE | Admit: 2013-03-25 | Payer: BC Managed Care – PPO | Source: Ambulatory Visit

## 2013-03-31 ENCOUNTER — Other Ambulatory Visit: Payer: Self-pay

## 2013-03-31 DIAGNOSIS — R251 Tremor, unspecified: Secondary | ICD-10-CM

## 2013-04-01 ENCOUNTER — Encounter (HOSPITAL_COMMUNITY): Payer: Self-pay

## 2013-04-01 ENCOUNTER — Ambulatory Visit (HOSPITAL_COMMUNITY)
Admission: RE | Admit: 2013-04-01 | Discharge: 2013-04-01 | Disposition: A | Discharge status: Home / Self Care | Payer: BC Managed Care – PPO | Source: Ambulatory Visit | Attending: Neurology | Admitting: Neurology

## 2013-04-01 DIAGNOSIS — M62838 Other muscle spasm: Secondary | ICD-10-CM

## 2013-04-01 DIAGNOSIS — R251 Tremor, unspecified: Secondary | ICD-10-CM

## 2013-04-01 DIAGNOSIS — R259 Unspecified abnormal involuntary movements: Secondary | ICD-10-CM | POA: Insufficient documentation

## 2013-04-01 LAB — CREATININE, SERUM
Creatinine, Ser: 0.75 mg/dL (ref 0.50–1.10)
GFR calc Af Amer: >90 mL/min
GFR calc non Af Amer: 88 mL/min — ABNORMAL LOW

## 2013-04-01 MED ORDER — GADOBENATE DIMEGLUMINE 529 MG/ML IV SOLN
20.0000 mL | Freq: Once | INTRAVENOUS | Status: AC
  Administered 2013-04-01: 20 mL via INTRAVENOUS

## 2013-04-02 ENCOUNTER — Telehealth: Payer: Self-pay | Admitting: Neurology

## 2013-04-02 NOTE — Telephone Encounter (Signed)
Called and spoke with the patient. Information given as per Dr. Carles Collet below. No additional questions/concerns at this time.

## 2013-04-02 NOTE — Telephone Encounter (Signed)
Message copied by [name] Pou on Fri Apr 02, 2013  2:01 PM ------      Message from: TAT, Wamic S      Created: Fri Apr 02, 2013  8:25 AM       Reviewed.  Agree.  Nl MRI brain.  Tiffany, please let pt know.  Thx ------

## 2013-04-24 ENCOUNTER — Emergency Department (HOSPITAL_COMMUNITY)
Admission: EM | Admit: 2013-04-24 | Discharge: 2013-04-24 | Disposition: A | Discharge status: Home / Self Care | Payer: BC Managed Care – PPO | Attending: Emergency Medicine | Admitting: Emergency Medicine

## 2013-04-24 ENCOUNTER — Emergency Department (HOSPITAL_COMMUNITY): Payer: BC Managed Care – PPO

## 2013-04-24 ENCOUNTER — Encounter (HOSPITAL_COMMUNITY): Payer: Self-pay | Admitting: Radiology

## 2013-04-24 DIAGNOSIS — S0003XA Contusion of scalp, initial encounter: Secondary | ICD-10-CM | POA: Insufficient documentation

## 2013-04-24 DIAGNOSIS — W010XXA Fall on same level from slipping, tripping and stumbling without subsequent striking against object, initial encounter: Secondary | ICD-10-CM | POA: Insufficient documentation

## 2013-04-24 DIAGNOSIS — Z8739 Personal history of other diseases of the musculoskeletal system and connective tissue: Secondary | ICD-10-CM | POA: Insufficient documentation

## 2013-04-24 DIAGNOSIS — Z87442 Personal history of urinary calculi: Secondary | ICD-10-CM | POA: Insufficient documentation

## 2013-04-24 DIAGNOSIS — Z79899 Other long term (current) drug therapy: Secondary | ICD-10-CM | POA: Insufficient documentation

## 2013-04-24 DIAGNOSIS — S01309A Unspecified open wound of unspecified ear, initial encounter: Secondary | ICD-10-CM | POA: Insufficient documentation

## 2013-04-24 DIAGNOSIS — E039 Hypothyroidism, unspecified: Secondary | ICD-10-CM | POA: Insufficient documentation

## 2013-04-24 DIAGNOSIS — T07XXXA Unspecified multiple injuries, initial encounter: Secondary | ICD-10-CM

## 2013-04-24 DIAGNOSIS — Y9289 Other specified places as the place of occurrence of the external cause: Secondary | ICD-10-CM | POA: Insufficient documentation

## 2013-04-24 DIAGNOSIS — IMO0002 Reserved for concepts with insufficient information to code with codable children: Secondary | ICD-10-CM | POA: Insufficient documentation

## 2013-04-24 DIAGNOSIS — Z23 Encounter for immunization: Secondary | ICD-10-CM | POA: Insufficient documentation

## 2013-04-24 DIAGNOSIS — S00512A Abrasion of oral cavity, initial encounter: Secondary | ICD-10-CM

## 2013-04-24 DIAGNOSIS — Y9389 Activity, other specified: Secondary | ICD-10-CM | POA: Insufficient documentation

## 2013-04-24 DIAGNOSIS — K219 Gastro-esophageal reflux disease without esophagitis: Secondary | ICD-10-CM | POA: Insufficient documentation

## 2013-04-24 DIAGNOSIS — S022XXA Fracture of nasal bones, initial encounter for closed fracture: Secondary | ICD-10-CM

## 2013-04-24 DIAGNOSIS — I10 Essential (primary) hypertension: Secondary | ICD-10-CM | POA: Insufficient documentation

## 2013-04-24 DIAGNOSIS — Z862 Personal history of diseases of the blood and blood-forming organs and certain disorders involving the immune mechanism: Secondary | ICD-10-CM | POA: Insufficient documentation

## 2013-04-24 DIAGNOSIS — J45909 Unspecified asthma, uncomplicated: Secondary | ICD-10-CM | POA: Insufficient documentation

## 2013-04-24 DIAGNOSIS — S00209A Unspecified superficial injury of unspecified eyelid and periocular area, initial encounter: Secondary | ICD-10-CM | POA: Insufficient documentation

## 2013-04-24 DIAGNOSIS — S01501A Unspecified open wound of lip, initial encounter: Secondary | ICD-10-CM | POA: Insufficient documentation

## 2013-04-24 DIAGNOSIS — W19XXXA Unspecified fall, initial encounter: Secondary | ICD-10-CM

## 2013-04-24 MED ORDER — OXYCODONE-ACETAMINOPHEN 5-325 MG PO TABS: 1.0000 | ORAL_TABLET | ORAL | Status: DC | PRN

## 2013-04-24 MED ORDER — BACITRACIN ZINC 500 UNIT/GM EX OINT
TOPICAL_OINTMENT | Freq: Two times a day (BID) | CUTANEOUS | Status: DC
  Filled 2013-04-24: qty 28.35

## 2013-04-24 MED ORDER — TETANUS-DIPHTH-ACELL PERTUSSIS 5-2.5-18.5 LF-MCG/0.5 IM SUSP
0.5000 mL | Freq: Once | INTRAMUSCULAR | Status: AC
  Administered 2013-04-24: 0.5 mL via INTRAMUSCULAR
  Filled 2013-04-24: qty 0.5

## 2013-04-24 MED ORDER — OXYCODONE-ACETAMINOPHEN 5-325 MG PO TABS
1.0000 | ORAL_TABLET | Freq: Once | ORAL | Status: AC
  Administered 2013-04-24: 1 via ORAL
  Filled 2013-04-24: qty 1

## 2013-04-24 MED ORDER — BACITRACIN 500 UNIT/GM EX OINT
1.0000 "application " | TOPICAL_OINTMENT | Freq: Two times a day (BID) | CUTANEOUS | Status: DC
  Administered 2013-04-24: 1 via TOPICAL
  Filled 2013-04-24: qty 0.9

## 2013-04-24 MED ORDER — IBUPROFEN 600 MG PO TABS: 600.0000 mg | ORAL_TABLET | Freq: Four times a day (QID) | ORAL | Status: DC | PRN

## 2013-04-24 NOTE — ED Provider Notes (Signed)
CT scans x-ray, reviewed.  Vision has a nondisplaced left nasal bone fracture and neck, and hand x-rays are within normal limits. Every examination, laceration on the inner lower lip, ears to be more of an superficial avulsion, than a true laceration and at this time.  I do not feel sutures are necessary.  Patient has been given additional pain medication.  Garald Balding, NP 04/24/13 2109

## 2013-04-24 NOTE — ED Provider Notes (Signed)
CSN: [id number]     Arrival date & time 04/24/13  1754 History   First MD Initiated Contact with Patient 04/24/13 1810     Chief Complaint  Patient presents with  . Facial Laceration   (Consider location/radiation/quality/duration/timing/severity/associated sxs/prior Treatment) The history is provided by the patient.   patient presents after a fall. She tripped over a parking stop and hit her face. No loss of conscious. She has pain in her face and her left hand. She is not on anticoagulation. No difficulty seeing. No chest or abdominal pain. No numbness or weakness. She does not know when her last tetanus was. Past Medical History  Diagnosis Date  . Hypothyroidism   . Asthma   . Nephrolithiasis     hx of kidney stones, last 6 months ago  . Arthritis   . Blood clot in vein  age 62    left leg  . Anemia   . Multiple pregnancy loss, not currently pregnant     multiple miscarriages-pt.unsure of #  . GERD (gastroesophageal reflux disease)   . Osteopenia   . Hypertension     pt denies, in physician record  . Hyperlipidemia    Past Surgical History  Procedure Laterality Date  . Colonscopy  2011  . Joint replacement  05-2007    right knee replacment/revision  . Cystoscopy  2012  . Total knee revision  06/03/2012    Procedure: TOTAL KNEE REVISION;  Surgeon: Gearlean Alf, MD;  Location: [facility];  Service: Orthopedics;  Laterality: Left;  Revision of a Left Uni Knee to a Total Knee Arthroplasty  . Breast surgery      rt. lumpectomy, benign  . Replacement total knee Left 06/05/12  . Kidney stone surgery     Family History  Problem Relation Age of Onset  . Diabetes Mother   . Heart failure Mother     CHF  . Thyroid disease Mother   . Heart failure Father     CHF  . Breast cancer Sister    History  Substance Use Topics  . Smoking status: Never Smoker   . Smokeless tobacco: Never Used  . Alcohol Use: No   OB History   Grav Para Term Preterm Abortions TAB SAB Ect Mult  Living                 Obstetric Comments   Multiple miscarriages.  1 adopted child.     Review of Systems  Constitutional: Negative for activity change and appetite change.  HENT: Negative for neck stiffness.   Eyes: Negative for pain.  Respiratory: Negative for chest tightness and shortness of breath.   Cardiovascular: Negative for chest pain and leg swelling.  Gastrointestinal: Negative for nausea, vomiting, abdominal pain and diarrhea.  Genitourinary: Negative for flank pain.  Musculoskeletal: Negative for back pain.  Skin: Positive for wound. Negative for rash.  Neurological: Negative for weakness, numbness and headaches.  Psychiatric/Behavioral: Negative for behavioral problems.    Allergies  Symbicort  Home Medications   Current Outpatient Rx  Name  Route  Sig  Dispense  Refill  . acetaminophen (TYLENOL) 325 MG tablet   Oral   Take 2 tablets (650 mg total) by mouth every 6 (six) hours as needed.         . citalopram (CELEXA) 20 MG tablet   Oral   Take 20 mg by mouth every other day.          . levothyroxine (SYNTHROID, LEVOTHROID) 75 MCG  tablet   Oral   Take 75 mcg by mouth every evening. ON AN EMPTY STOMACH         . omeprazole (PRILOSEC OTC) 20 MG tablet   Oral   Take 20 mg by mouth daily before breakfast.         . PROAIR HFA 108 (90 BASE) MCG/ACT inhaler   Inhalation   Inhale 2 puffs into the lungs every 4 (four) hours as needed. Wheezing and shortness of breath         . PULMICORT FLEXHALER 90 MCG/ACT inhaler   Inhalation   Inhale 2 puffs into the lungs 2 (two) times daily.           BP 149/72  Pulse 71  Temp(Src) 98.1 F (36.7 C) (Oral)  Resp 18  SpO2 98% Physical Exam  Constitutional: She is oriented to person, place, and time. She appears well-developed and well-nourished.  HENT:  Head: Normocephalic.  Hematoma to mid forehead with slight abrasion. abrasion over bridge nose with swelling. Mild abrasion to left upper eyelid.  Extraocular movements intact. Face is stable. There is a vertical laceration to the mucosal surface of the left lower lip. Is approximately 2 cm long 1 cm wide. Jaw opens and closes without pain. No tenderness over TMJ. Teeth feel aligned to patient.  Eyes: EOM are normal. Pupils are equal, round, and reactive to light.  Neck:  No midline tenderness. Cervical collar in place  Cardiovascular: Normal rate and regular rhythm.   Pulmonary/Chest: Effort normal and breath sounds normal.  Abdominal: Soft. Bowel sounds are normal.  Musculoskeletal: She exhibits no edema.  Tenderness to left hand over proximal phalanx of all fingers. Some swelling of second and third finger proximal interphalangeal joint. Skin grossly intact no tenderness of her wrist. No tenderness over elbow. Range of motion intact over shoulder and elbow, however patient is not able to make a fist. A  Neurological: She is alert and oriented to person, place, and time.  Skin: Skin is warm.    ED Course  Procedures (including critical care time) Labs Review Labs Reviewed - No data to display Imaging Review No results found.  MDM  No diagnosis found. Patient with fall. Laceration to lip and nasal bone fracture. Tetanus has been updated. An x-ray still pending at this time    Jasper Riling. Alvino Chapel, MD 04/24/13 2024

## 2013-04-24 NOTE — ED Notes (Signed)
Per EMS: Pt walking while texting on cell phone and tripped. Facial abrasions noted to face, lip laceration. Bleeding controlled. AO x4. 200/112. 85 NSR. 100% RA.

## 2013-04-25 NOTE — ED Provider Notes (Signed)
Medical screening examination/treatment/procedure(s) were conducted as a shared visit with non-physician practitioner(s) and myself.  I personally evaluated the patient during the encounter  Jasper Riling. Alvino Chapel, MD 04/25/13 1056

## 2013-06-03 ENCOUNTER — Other Ambulatory Visit: Payer: Self-pay

## 2013-07-29 DIAGNOSIS — G903 Multi-system degeneration of the autonomic nervous system: Secondary | ICD-10-CM

## 2013-07-29 HISTORY — DX: Multi-system degeneration of the autonomic nervous system: G90.3

## 2013-09-13 ENCOUNTER — Ambulatory Visit: Payer: BC Managed Care – PPO | Admitting: Neurology

## 2013-09-13 ENCOUNTER — Telehealth: Payer: Self-pay | Admitting: Neurology

## 2013-09-13 NOTE — Telephone Encounter (Signed)
Pt canc today's appt d/t family emergency. She will call back later to r/s appt / Sherri S.

## 2013-10-13 ENCOUNTER — Ambulatory Visit (INDEPENDENT_AMBULATORY_CARE_PROVIDER_SITE_OTHER): Payer: BC Managed Care – PPO | Admitting: Neurology

## 2013-10-13 ENCOUNTER — Encounter: Payer: Self-pay | Admitting: Neurology

## 2013-10-13 VITALS — BP 112/74 | HR 85 | Resp 18 | Ht 66.0 in | Wt 228.2 lb

## 2013-10-13 DIAGNOSIS — R259 Unspecified abnormal involuntary movements: Secondary | ICD-10-CM

## 2013-10-13 DIAGNOSIS — M545 Low back pain, unspecified: Secondary | ICD-10-CM

## 2013-10-13 DIAGNOSIS — G249 Dystonia, unspecified: Secondary | ICD-10-CM

## 2013-10-13 DIAGNOSIS — M542 Cervicalgia: Secondary | ICD-10-CM

## 2013-10-13 DIAGNOSIS — R251 Tremor, unspecified: Secondary | ICD-10-CM

## 2013-10-13 MED ORDER — CARBIDOPA-LEVODOPA 25-100 MG PO TABS: 1.0000 | ORAL_TABLET | Freq: Three times a day (TID) | ORAL | Status: DC

## 2013-10-13 NOTE — Progress Notes (Signed)
Subjective:   Rachel [name] was seen in consultation in the movement disorder clinic at the request of Marton Redwood, MD.  The evaluation is for tremor.  The patient is a 63 y.o. right handed female with a history of tremor.  The pt reports that she first noted shaking in the legs and arms bilaterally about 1 - 1.5 years ago.  It was intermittent.  She now notices tremor of the hands, legs and head.  She has better and worse days.  She notes that lying in the recliner helps.  She notes that walking and exercise makes it worse.  The L leg feels that it will stiffen up and cause the tremor.  There is a family hx of tremor in her mother but it was undiagnosed.    Affected by caffeine:  yes (quit drinking caffeine all together) Exacerbated by albuterol:  Yes Affected by alcohol:  unknown Affected by stress:  yes Affected by fatigue:  yes Spills soup if on spoon:  yes Spills glass of liquid if full:  yes Affects ADL's (tying shoes, brushing teeth, etc):  no Affects ability to put on makeup:  Yes  09/1813 update:  The patient is following up today regarding tremor.  She is accompanied by her spouse, who supplements the history.  I have not seen the patient since her first appointment in August, 2014.  At that point in time, she did not want any medication for the tremor.  She did have an MRI of the brain in August, 2014 that was normal.  I did review this.  Over the course of time, she has noted tremor in the L foot and hand.  She states that if she uses the hands, her L leg will stiffen and will eventually hurt.  Minimal neck pain.  Has back pain but thinks that is independent.  Her bladder/bowel work okay.  Tremor is much better in the hands, L more than R, but it is better now that she is taking herbs that her chiropractor gave her.  She is receiving kinesiology treatment.  She feels that her walking is "awkward" because of the foot.  She has trouble with arm swing.  She reports some shakiness with  handwriting (R hand dominant) but no micrographia.  She is sleeping well.  She has no vivid dreams.  No sleep walking/talking.  No diplopia.  Does have hx of scintillating scotomas that is sometimes associated with migraine.  Denies swallowing problems.  No speech changes per pt but husband states that sometimes pt inserts one word for another or has word finding trouble.  Has "no smell" but able to taste, but thinks that there is some loss because of not being able to taste food.  She admits that she has been researching various causes of her symptoms, including Parkinson's and dystonias.  She requests a DaT scan.  Current/Previously tried tremor medications: tried propranolol on one occasion but seemed to make her sick and she did not want to take the next pill  Current medications that may exacerbate tremor:  proair  Outside reports reviewed: historical medical records.  Allergies  Allergen Reactions  . Symbicort [Budesonide-Formoterol Fumarate] Other (See Comments)    Shakes and muscle weakness    Current Outpatient Prescriptions on File Prior to Visit  Medication Sig Dispense Refill  . citalopram (CELEXA) 20 MG tablet Take 20 mg by mouth daily.       Marland Kitchen levothyroxine (SYNTHROID, LEVOTHROID) 88 MCG tablet Take 88 mcg by  mouth at bedtime.      . Multiple Vitamin (MULTIVITAMIN WITH MINERALS) TABS tablet Take 2 tablets by mouth daily.      Marland Kitchen omeprazole (PRILOSEC OTC) 20 MG tablet Take 20 mg by mouth daily before breakfast.      . OVER THE COUNTER MEDICATION Take 1 tablet by mouth daily. Herbal Used for tremors.      . Polyvinyl Alcohol-Povidone (REFRESH OP) Place 1 drop into both eyes daily as needed (dry eye).      Marland Kitchen PROAIR HFA 108 (90 BASE) MCG/ACT inhaler Inhale 2 puffs into the lungs every 4 (four) hours as needed. Wheezing and shortness of breath      . PULMICORT FLEXHALER 90 MCG/ACT inhaler Inhale 2 puffs into the lungs daily.        No current facility-administered medications on file  prior to visit.    Past Medical History  Diagnosis Date  . Hypothyroidism   . Asthma   . Nephrolithiasis     hx of kidney stones, last 6 months ago  . Arthritis   . Blood clot in vein  age 18    left leg  . Anemia   . Multiple pregnancy loss, not currently pregnant     multiple miscarriages-pt.unsure of #  . GERD (gastroesophageal reflux disease)   . Osteopenia   . Hypertension     pt denies, in physician record  . Hyperlipidemia     Past Surgical History  Procedure Laterality Date  . Colonscopy  2011  . Joint replacement  05-2007    right knee replacment/revision  . Cystoscopy  2012  . Total knee revision  06/03/2012    Procedure: TOTAL KNEE REVISION;  Surgeon: Gearlean Alf, MD;  Location: [facility];  Service: Orthopedics;  Laterality: Left;  Revision of a Left Uni Knee to a Total Knee Arthroplasty  . Breast surgery      rt. lumpectomy, benign  . Replacement total knee Left 06/05/12  . Kidney stone surgery      History   Social History  . Marital Status: Married    Spouse Name: N/A    Number of Children: N/A  . Years of Education: N/A   Occupational History  . retired     Research scientist (physical sciences)   Social History Main Topics  . Smoking status: Never Smoker   . Smokeless tobacco: Never Used  . Alcohol Use: No  . Drug Use: No  . Sexual Activity: Yes    Birth Control/ Protection: Post-menopausal   Other Topics Concern  . Not on file   Social History Narrative  . No narrative on file    Family Status  Relation Status Death Age  . Mother Deceased     fall, death during surgery  . Father Deceased     ? heart  . Brother Alive     healthy  . Sister Alive     2, asthma, heart problem  . Child Alive     adopted, healthy    Review of Systems A complete 10 system ROS was obtained and was negative apart from what is mentioned.   Objective:   VITALS:   Filed Vitals:   10/13/13 0915  BP: 112/74  Pulse: 85  Resp: 18  Height: 5\' 6"  (1.676 m)  Weight: 228 lb  3 oz (103.505 kg)   Gen:  Appears stated age and in NAD. HEENT:  Normocephalic, atraumatic. The mucous membranes are moist. The superficial temporal arteries are without ropiness or  tenderness. Cardiovascular: Regular rate and rhythm. Lungs: Clear to auscultation bilaterally. Neck: There are no carotid bruits noted bilaterally.  NEUROLOGICAL:  Orientation:  The patient is alert and oriented x 3.  Recent and remote memory are intact.  Attention span and concentration are normal.  Able to name objects and repeat without trouble.  Fund of knowledge is appropriate Cranial nerves: There is good facial symmetry. The pupils are equal round and reactive to light bilaterally. Fundoscopic exam reveals clear disc margins bilaterally. Extraocular muscles are intact and visual fields are full to confrontational testing. Speech is fluent and clear. Soft palate rises symmetrically and there is no tongue deviation. Hearing is intact to conversational tone. Tone: Tone is good throughout.  There is no spasticity or rigidity. Sensation: Sensation is intact to light touch and pinprick throughout (facial, trunk, extremities). Vibration is intact at the bilateral big toe. There is no extinction with double simultaneous stimulation. There is no sensory dermatomal level identified. Coordination:  The patient has no dysdiadichokinesia or dysmetria. Motor: Strength is 5/5 in the bilateral upper and lower extremities.  Shoulder shrug is equal bilaterally.  There is no pronator drift.  There are no fasciculations noted. DTR's: Deep tendon reflexes are 2+/4 at the bilateral biceps, triceps, brachioradialis, right patella, 2+-3 at the left patella with cross adductor reflex and trace at the bilateral achilles.  Plantar responses are downgoing bilaterally. Gait and Station: The patient is able to ambulate without difficulty. The patient has some difficulty ambulating in a tandem fashion.  It is minor.  She is able to stand in the  Romberg position.  MOVEMENT EXAM: Tremor:  There is head titubation in the "yes" direction.  It is mild.  There is mild tremor in the UE, noted most significantly with action.  The left is worse than right.  There is a nonrhythmic tremor of the left leg.  She keeps both legs moving at all times, as she states it is uncomfortable when she does not.  At times, the left leg will straighten out, but it is not associated with rigidity or spasticity.     Assessment/Plan:   1. the patient describes to me many symptoms that would be consistent with a parkinsonian states, yet her examination is not necessarily consistent with one.  She has no rigidity and really no bradykinesia at all.  She has read a lot on the Internet.  She does want a DaT scan.  We will go ahead and proceed.  I am also going to proceed with MRIs of the cervical and lumbar spine, to make sure that we are not dealing with a myelopathy, although I do not see a letter symptoms consistent with this.  The differential diagnosis would consist of parkinsonism (based on history more than physical), myelopathy, dystonia and psychogenic.  We will try levodopa, as many dystonias respond to this.  If is not useful, then we will go ahead and discontinue it next visit.  I will plan on seeing her back after the above has been completed.  Time in the room was 45 min with high complexity.

## 2013-10-13 NOTE — Patient Instructions (Addendum)
1. We have scheduled you at [facility] for your MRI on 10/28/13 at 1:00 pm. Please arrive 15 minutes prior and go to 1st floor radiology. If you need to reschedule please call [phone]. 2. We are referring you to [facility] for a DaT scan. They will contact you directly to set up a time for this testing. If you do not hear from them they can be contacted at [phone]. 3. Please start Carbidopa-Levodopa as follows: 1/2 tab three times a day before meals x 1 wk, then 1/2 in am & noon & 1 in evening for a week, then 1/2 in am &1 at noon &one in evening for a week, then 1 tablet three times a day before meals. The prescription has been called in to your pharmacy.  4. Follow up 6 weeks.

## 2013-10-18 ENCOUNTER — Telehealth: Payer: Self-pay | Admitting: Neurology

## 2013-10-18 NOTE — Telephone Encounter (Signed)
Spoke with patient. She states she has had abnormal kidney levels in the past. She was reading information sent to her about the DaT scan and wasn't sure what to do about that. I advised that she will usually have a BUN/Creatinine check prior to being given contrast but for her to contact [facility] to make sure that is there protocol. She will do that and let me know if she has any other questions.

## 2013-10-18 NOTE — Telephone Encounter (Signed)
Calling with questions about Dat Scan, concerns with kidney function. Please call [phone] / Sherri S.

## 2013-10-19 ENCOUNTER — Telehealth: Payer: Self-pay | Admitting: Neurology

## 2013-10-19 NOTE — Telephone Encounter (Signed)
Spoke with patient and she spoke with me yesterday about her concerns and meant to call [facility] She will call as needed.

## 2013-10-19 NOTE — Telephone Encounter (Signed)
Pt called requesting to speak to a nurse regarding her MRI on 11/11/13. Please call pt.

## 2013-10-22 ENCOUNTER — Telehealth: Payer: Self-pay | Admitting: Neurology

## 2013-10-22 NOTE — Telephone Encounter (Signed)
Patient called back and states that she wants to have the DaT scan first and will proceed with MR if DaT scan does not show anything. She states she thinks she has a problem in her brain, not her spine, so she does not want to undergo MR at the moment until she has the other results. She is calling to cancel her testing. I told her that I would let Dr Tat know that is her plan.

## 2013-10-22 NOTE — Telephone Encounter (Signed)
Pt called wanting to know why Dr. Carles Collet ordered another MRI when the first one came back negative. Please call pt and confirm with her.

## 2013-10-22 NOTE — Telephone Encounter (Signed)
I'm a little confused I think. She had previously had MRI of the cervical/lumbar region?  I see she had a CT but that doesn't show the nerves like an MRI

## 2013-10-22 NOTE — Telephone Encounter (Signed)
Should I let the patient know you are trying to r/o myelopathy?

## 2013-10-22 NOTE — Telephone Encounter (Signed)
She thought we were performing MR Brain. I explained it was of her spine. She expressed understanding and will proceed.

## 2013-10-28 ENCOUNTER — Ambulatory Visit (HOSPITAL_COMMUNITY): Admission: RE | Admit: 2013-10-28 | Payer: BC Managed Care – PPO | Source: Ambulatory Visit

## 2013-10-28 ENCOUNTER — Ambulatory Visit (HOSPITAL_COMMUNITY): Payer: BC Managed Care – PPO

## 2013-11-18 ENCOUNTER — Telehealth: Payer: Self-pay | Admitting: Neurology

## 2013-11-18 NOTE — Telephone Encounter (Signed)
Please call the pt with DaT scan results. CB# [phone] / Sherri S.

## 2013-11-19 ENCOUNTER — Ambulatory Visit (INDEPENDENT_AMBULATORY_CARE_PROVIDER_SITE_OTHER): Payer: BC Managed Care – PPO | Admitting: Neurology

## 2013-11-19 ENCOUNTER — Encounter: Payer: Self-pay | Admitting: Neurology

## 2013-11-19 VITALS — BP 124/74 | HR 70 | Resp 18 | Ht 66.0 in | Wt 226.4 lb

## 2013-11-19 DIAGNOSIS — G232 Striatonigral degeneration: Secondary | ICD-10-CM

## 2013-11-19 DIAGNOSIS — G238 Other specified degenerative diseases of basal ganglia: Secondary | ICD-10-CM

## 2013-11-19 MED ORDER — CARBIDOPA-LEVODOPA 25-100 MG PO TABS: 1.0000 | ORAL_TABLET | Freq: Four times a day (QID) | ORAL | Status: DC

## 2013-11-19 NOTE — Patient Instructions (Signed)
1. Increase Carbidopa Levodopa to 1 tablet 4 times daily. A new prescription has been sent to your pharmacy. 2. You have been referred to [facility]. They will call you directly to schedule an appointment.  Please call [phone] if you do not hear from them.

## 2013-11-19 NOTE — Progress Notes (Signed)
Subjective:   Rachel [name] was seen in consultation in the movement disorder clinic at the request of Marton Redwood, MD.  The evaluation is for tremor.  The patient is a 63 y.o. right handed female with a history of tremor.  The pt reports that she first noted shaking in the legs and arms bilaterally about 1 - 1.5 years ago.  It was intermittent.  She now notices tremor of the hands, legs and head.  She has better and worse days.  She notes that lying in the recliner helps.  She notes that walking and exercise makes it worse.  The L leg feels that it will stiffen up and cause the tremor.  There is a family hx of tremor in her mother but it was undiagnosed.    Affected by caffeine:  yes (quit drinking caffeine all together) Exacerbated by albuterol:  Yes Affected by alcohol:  unknown Affected by stress:  yes Affected by fatigue:  yes Spills soup if on spoon:  yes Spills glass of liquid if full:  yes Affects ADL's (tying shoes, brushing teeth, etc):  no Affects ability to put on makeup:  Yes  09/1813 update:  The patient is following up today regarding tremor.  She is accompanied by her spouse, who supplements the history.  I have not seen the patient since her first appointment in August, 2014.  At that point in time, she did not want any medication for the tremor.  She did have an MRI of the brain in August, 2014 that was normal.  I did review this.  Over the course of time, she has noted tremor in the L foot and hand.  She states that if she uses the hands, her L leg will stiffen and will eventually hurt.  Minimal neck pain.  Has back pain but thinks that is independent.  Her bladder/bowel work okay.  Tremor is much better in the hands, L more than R, but it is better now that she is taking herbs that her chiropractor gave her.  She is receiving kinesiology treatment.  She feels that her walking is "awkward" because of the foot.  She has trouble with arm swing.  She reports some shakiness with  handwriting (R hand dominant) but no micrographia.  She is sleeping well.  She has no vivid dreams.  No sleep walking/talking.  No diplopia.  Does have hx of scintillating scotomas that is sometimes associated with migraine.  Denies swallowing problems.  No speech changes per pt but husband states that sometimes pt inserts one word for another or has word finding trouble.  Has "no smell" but able to taste, but thinks that there is some loss because of not being able to taste food.  She admits that she has been researching various causes of her symptoms, including Parkinson's and dystonias.  She requests a DaT scan.  11/19/13 update:  The patient is following up today, accompanied by her husband who helps to supplement the history.  Since our last visit, the patient did undergo a DaT scan.  No films are available.  However, I did get the report.  There was asymmetric uptake, with no uptake in the right putamen and normal on the left.  Pt opted to hold on the MRIs ordered last visit.  We did start carbidopa/levodopa last visit and the patient states that it is about 30% beneficial for her symptoms.   Current/Previously tried tremor medications: tried propranolol on one occasion but seemed to make her  sick and she did not want to take the next pill  Current medications that may exacerbate tremor:  proair  Outside reports reviewed: historical medical records.  Allergies  Allergen Reactions  . Symbicort [Budesonide-Formoterol Fumarate] Other (See Comments)    Shakes and muscle weakness    Current Outpatient Prescriptions on File Prior to Visit  Medication Sig Dispense Refill  . CALCIUM LACTATE PO Take 6 tablets by mouth daily.      . carbidopa-levodopa (SINEMET IR) 25-100 MG per tablet Take 1 tablet by mouth 3 (three) times daily.  90 tablet  2  . Cholecalciferol (VITAMIN D-3 PO) Take 3 tablets by mouth daily.      . citalopram (CELEXA) 20 MG tablet Take 20 mg by mouth daily.       Marland Kitchen levothyroxine  (SYNTHROID, LEVOTHROID) 88 MCG tablet Take 88 mcg by mouth at bedtime.      . Multiple Vitamin (MULTIVITAMIN WITH MINERALS) TABS tablet Take 2 tablets by mouth daily.      Marland Kitchen omeprazole (PRILOSEC OTC) 20 MG tablet Take 20 mg by mouth daily before breakfast.      . OVER THE COUNTER MEDICATION Take 1 tablet by mouth daily. Neurotrophine - 3 tablets daily Myoplus 4 tablets daily Cataplex F - 3 tablets daily      . Polyvinyl Alcohol-Povidone (REFRESH OP) Place 1 drop into both eyes daily as needed (dry eye).      Marland Kitchen PROAIR HFA 108 (90 BASE) MCG/ACT inhaler Inhale 2 puffs into the lungs every 4 (four) hours as needed. Wheezing and shortness of breath      . Probiotic Product (PROBIOTIC DAILY PO) Take 1 tablet by mouth daily.      Marland Kitchen PULMICORT FLEXHALER 90 MCG/ACT inhaler Inhale 2 puffs into the lungs daily.       Marland Kitchen QUERCETIN PO Take 4 tablets by mouth daily.      Marland Kitchen VALERIAN PO Take 4 tablets by mouth daily.       No current facility-administered medications on file prior to visit.    Past Medical History  Diagnosis Date  . Hypothyroidism   . Asthma   . Nephrolithiasis     hx of kidney stones, last 6 months ago  . Arthritis   . Blood clot in vein  age 89    left leg  . Anemia   . Multiple pregnancy loss, not currently pregnant     multiple miscarriages-pt.unsure of #  . GERD (gastroesophageal reflux disease)   . Osteopenia   . Hypertension     pt denies, in physician record  . Hyperlipidemia   . Tendinitis     left foot    Past Surgical History  Procedure Laterality Date  . Colonscopy  2011  . Joint replacement  05-2007    right knee replacment/revision  . Cystoscopy  2012  . Total knee revision  06/03/2012    Procedure: TOTAL KNEE REVISION;  Surgeon: Gearlean Alf, MD;  Location: [facility];  Service: Orthopedics;  Laterality: Left;  Revision of a Left Uni Knee to a Total Knee Arthroplasty  . Breast surgery      rt. lumpectomy, benign  . Replacement total knee Left 06/05/12  .  Kidney stone surgery      History   Social History  . Marital Status: Married    Spouse Name: N/A    Number of Children: N/A  . Years of Education: N/A   Occupational History  . retired  receptionist   Social History Main Topics  . Smoking status: Never Smoker   . Smokeless tobacco: Never Used  . Alcohol Use: No  . Drug Use: No  . Sexual Activity: Yes    Birth Control/ Protection: Post-menopausal   Other Topics Concern  . Not on file   Social History Narrative  . No narrative on file    Family Status  Relation Status Death Age  . Mother Deceased     fall, death during surgery  . Father Deceased     ? heart  . Brother Alive     healthy  . Sister Alive     2, asthma, heart problem  . Child Alive     adopted, healthy    Review of Systems A complete 10 system ROS was obtained and was negative apart from what is mentioned.   Objective:   VITALS:   Filed Vitals:   11/19/13 1314  BP: 124/74  Pulse: 70  Resp: 18  Height: 5\' 6"  (1.676 m)  Weight: 226 lb 6.4 oz (102.694 kg)   Gen:  Appears stated age and in NAD. HEENT:  Normocephalic, atraumatic. The mucous membranes are moist. The superficial temporal arteries are without ropiness or tenderness. Cardiovascular: Regular rate and rhythm. Lungs: Clear to auscultation bilaterally. Neck: There are no carotid bruits noted bilaterally.  NEUROLOGICAL:  Orientation:  The patient is alert and oriented x 3.  Recent and remote memory are intact.  Attention span and concentration are normal.  Able to name objects and repeat without trouble.  Fund of knowledge is appropriate Cranial nerves: There is good facial symmetry. The pupils are equal round and reactive to light bilaterally. Fundoscopic exam reveals clear disc margins bilaterally. Extraocular muscles are intact and visual fields are full to confrontational testing. Speech is fluent and clear. Soft palate rises symmetrically and there is no tongue deviation.  Hearing is intact to conversational tone. Tone: Tone is good throughout.  There is no spasticity or rigidity. Coordination:  The patient has some difficulty with finger taps on the left.  She really has no difficulty with hand opening and closing or alternation of supination/pronation of the forearm.   Motor: Strength is 5/5 in the bilateral upper and lower extremities.  Shoulder shrug is equal bilaterally.  There is no pronator drift.  There are no fasciculations noted. DTR's: Deep tendon reflexes are 2+/4 at the bilateral biceps, triceps, brachioradialis, right patella, 2+-3 at the left patella with cross adductor reflex and trace at the bilateral achilles.  Plantar responses are downgoing bilaterally. Gait and Station: The patient is able to ambulate but she is slightly more unstable.  MOVEMENT EXAM: Tremor:   I did not see head tremor today.  I did not see action tremor today.  I did not see her left leg tremor today.   I did see intermittent dystonic movements of the left foot, but this is not common.     Assessment/Plan:   1. given the results of her DAT scan, the patient likely has MSA, with associated dystonia. She and I talked about the diagnosis.  We talked about what MSA was.  Patient education and literature was provided for her.  We will remain on the levodopa, but I did increase this to 4 times per day.  I am going to refer her for PT/OT.  We talked about the value of exercise, especially safety, cardiovascular exercise.  Greater than 50% of the visit was spent in counseling. 2.  Return  in about 4 months (around 03/21/2014).

## 2013-11-19 NOTE — Telephone Encounter (Signed)
Appt moved to today to discuss the results.

## 2013-11-22 ENCOUNTER — Other Ambulatory Visit: Payer: Self-pay

## 2013-11-22 DIAGNOSIS — Z1231 Encounter for screening mammogram for malignant neoplasm of breast: Secondary | ICD-10-CM

## 2013-11-24 ENCOUNTER — Ambulatory Visit: Payer: BC Managed Care – PPO | Admitting: Neurology

## 2013-11-25 ENCOUNTER — Other Ambulatory Visit (HOSPITAL_COMMUNITY): Payer: BC Managed Care – PPO

## 2013-12-02 ENCOUNTER — Telehealth: Payer: Self-pay | Admitting: Neurology

## 2013-12-02 ENCOUNTER — Ambulatory Visit: Payer: BC Managed Care – PPO | Attending: Neurology | Admitting: Physical Therapy

## 2013-12-02 DIAGNOSIS — R4189 Other symptoms and signs involving cognitive functions and awareness: Secondary | ICD-10-CM | POA: Insufficient documentation

## 2013-12-02 DIAGNOSIS — M2420 Disorder of ligament, unspecified site: Secondary | ICD-10-CM | POA: Diagnosis not present

## 2013-12-02 DIAGNOSIS — R279 Unspecified lack of coordination: Secondary | ICD-10-CM | POA: Diagnosis not present

## 2013-12-02 DIAGNOSIS — Z5189 Encounter for other specified aftercare: Secondary | ICD-10-CM | POA: Insufficient documentation

## 2013-12-02 DIAGNOSIS — M629 Disorder of muscle, unspecified: Secondary | ICD-10-CM | POA: Insufficient documentation

## 2013-12-02 NOTE — Telephone Encounter (Signed)
PT called at 9:06AM to check the name and address of her physical therapy provider Dr. Carles Collet  Referred her to. Her appt is today 12/02/13 at 9:45AM.

## 2013-12-02 NOTE — Telephone Encounter (Signed)
Patient made aware of the address of [facility].

## 2013-12-03 ENCOUNTER — Telehealth: Payer: Self-pay | Admitting: Certified Nurse Midwife

## 2013-12-03 NOTE — Telephone Encounter (Signed)
Returning patients call from message at lunch about her appointment in june

## 2013-12-08 ENCOUNTER — Ambulatory Visit: Payer: BC Managed Care – PPO | Admitting: Occupational Therapy

## 2013-12-08 ENCOUNTER — Ambulatory Visit: Payer: BC Managed Care – PPO | Admitting: Physical Therapy

## 2013-12-08 DIAGNOSIS — Z5189 Encounter for other specified aftercare: Secondary | ICD-10-CM | POA: Diagnosis not present

## 2013-12-09 ENCOUNTER — Ambulatory Visit: Payer: BC Managed Care – PPO | Admitting: Physical Therapy

## 2013-12-09 ENCOUNTER — Ambulatory Visit: Payer: BC Managed Care – PPO | Admitting: Occupational Therapy

## 2013-12-09 DIAGNOSIS — Z5189 Encounter for other specified aftercare: Secondary | ICD-10-CM | POA: Diagnosis not present

## 2013-12-15 ENCOUNTER — Ambulatory Visit: Payer: BC Managed Care – PPO | Admitting: Occupational Therapy

## 2013-12-15 ENCOUNTER — Ambulatory Visit: Payer: BC Managed Care – PPO | Admitting: Physical Therapy

## 2013-12-15 DIAGNOSIS — Z5189 Encounter for other specified aftercare: Secondary | ICD-10-CM | POA: Diagnosis not present

## 2013-12-17 ENCOUNTER — Ambulatory Visit: Payer: BC Managed Care – PPO | Admitting: Occupational Therapy

## 2013-12-17 ENCOUNTER — Ambulatory Visit: Payer: BC Managed Care – PPO | Admitting: Physical Therapy

## 2013-12-17 DIAGNOSIS — Z5189 Encounter for other specified aftercare: Secondary | ICD-10-CM | POA: Diagnosis not present

## 2013-12-21 ENCOUNTER — Telehealth: Payer: Self-pay | Admitting: Neurology

## 2013-12-21 NOTE — Telephone Encounter (Signed)
Tried to call patient x2 and phone picks up and hangs up. Will call again later.

## 2013-12-21 NOTE — Telephone Encounter (Signed)
Pt called stating meds causing severe constipation. She is very uncomfortable. Please call [phone] / Sherri S.

## 2013-12-21 NOTE — Telephone Encounter (Signed)
Left message on machine for patient to call back if needed.

## 2013-12-22 ENCOUNTER — Encounter: Payer: BC Managed Care – PPO | Admitting: Occupational Therapy

## 2013-12-22 ENCOUNTER — Ambulatory Visit: Payer: BC Managed Care – PPO | Admitting: Physical Therapy

## 2013-12-22 NOTE — Telephone Encounter (Signed)
Patient states she is experiencing a lot of constipation with the Levodopa. She is having a bowel movement approx every 2-5 days. She has tried stool softeners and laxatives. I tried to give her to rancho recipe but she can not have gluten due to intolerance. I advised her to try a cap full of miralax daily, increase fiber, increase exercise and water intake. She should avoid laxatives. She will call if needed.

## 2013-12-23 ENCOUNTER — Encounter: Payer: BC Managed Care – PPO | Admitting: Occupational Therapy

## 2013-12-23 ENCOUNTER — Ambulatory Visit: Payer: BC Managed Care – PPO | Admitting: Physical Therapy

## 2013-12-23 DIAGNOSIS — Z5189 Encounter for other specified aftercare: Secondary | ICD-10-CM | POA: Diagnosis not present

## 2013-12-24 ENCOUNTER — Ambulatory Visit: Payer: BC Managed Care – PPO | Admitting: Occupational Therapy

## 2013-12-24 DIAGNOSIS — Z5189 Encounter for other specified aftercare: Secondary | ICD-10-CM | POA: Diagnosis not present

## 2013-12-27 ENCOUNTER — Ambulatory Visit: Payer: BC Managed Care – PPO | Admitting: Occupational Therapy

## 2013-12-27 ENCOUNTER — Ambulatory Visit: Payer: BC Managed Care – PPO | Attending: Neurology | Admitting: Physical Therapy

## 2013-12-27 DIAGNOSIS — R4189 Other symptoms and signs involving cognitive functions and awareness: Secondary | ICD-10-CM | POA: Diagnosis not present

## 2013-12-27 DIAGNOSIS — M629 Disorder of muscle, unspecified: Secondary | ICD-10-CM | POA: Diagnosis not present

## 2013-12-27 DIAGNOSIS — M2420 Disorder of ligament, unspecified site: Secondary | ICD-10-CM | POA: Insufficient documentation

## 2013-12-27 DIAGNOSIS — R279 Unspecified lack of coordination: Secondary | ICD-10-CM | POA: Diagnosis not present

## 2013-12-27 DIAGNOSIS — Z5189 Encounter for other specified aftercare: Secondary | ICD-10-CM | POA: Diagnosis present

## 2013-12-29 ENCOUNTER — Ambulatory Visit
Admission: RE | Admit: 2013-12-29 | Discharge: 2013-12-29 | Disposition: A | Discharge status: Home / Self Care | Payer: BC Managed Care – PPO | Source: Ambulatory Visit

## 2013-12-29 DIAGNOSIS — Z1231 Encounter for screening mammogram for malignant neoplasm of breast: Secondary | ICD-10-CM

## 2014-01-10 ENCOUNTER — Ambulatory Visit: Payer: BC Managed Care – PPO | Admitting: Certified Nurse Midwife

## 2014-01-17 ENCOUNTER — Ambulatory Visit: Payer: BC Managed Care – PPO | Admitting: Occupational Therapy

## 2014-01-17 ENCOUNTER — Ambulatory Visit: Payer: BC Managed Care – PPO | Admitting: Physical Therapy

## 2014-01-18 ENCOUNTER — Ambulatory Visit: Payer: BC Managed Care – PPO | Admitting: Physical Therapy

## 2014-01-18 ENCOUNTER — Ambulatory Visit: Payer: BC Managed Care – PPO | Admitting: Occupational Therapy

## 2014-01-18 DIAGNOSIS — Z5189 Encounter for other specified aftercare: Secondary | ICD-10-CM | POA: Diagnosis not present

## 2014-01-19 ENCOUNTER — Ambulatory Visit: Payer: BC Managed Care – PPO | Admitting: Physical Therapy

## 2014-01-19 ENCOUNTER — Ambulatory Visit: Payer: BC Managed Care – PPO | Admitting: Occupational Therapy

## 2014-01-19 DIAGNOSIS — Z5189 Encounter for other specified aftercare: Secondary | ICD-10-CM | POA: Diagnosis not present

## 2014-01-20 ENCOUNTER — Encounter: Payer: Self-pay | Admitting: Certified Nurse Midwife

## 2014-01-20 ENCOUNTER — Ambulatory Visit (INDEPENDENT_AMBULATORY_CARE_PROVIDER_SITE_OTHER): Payer: BC Managed Care – PPO | Admitting: Certified Nurse Midwife

## 2014-01-20 VITALS — BP 120/62 | HR 72 | Ht 64.5 in | Wt 220.0 lb

## 2014-01-20 DIAGNOSIS — Z01419 Encounter for gynecological examination (general) (routine) without abnormal findings: Secondary | ICD-10-CM

## 2014-01-20 DIAGNOSIS — Z124 Encounter for screening for malignant neoplasm of cervix: Secondary | ICD-10-CM

## 2014-01-20 NOTE — Patient Instructions (Signed)

## 2014-01-20 NOTE — Progress Notes (Signed)
Patient ID: Rachel [name], female   DOB: November 17, 1950, 63 y.o.   MRN: [id number] 63 y.o.  white female Married Caucasian Fe gopo here for annual exam. Menopausal no HRT. Denies vaginal bleeding or vaginal dryness. Sees PCP for aex/ labs and medication management. Sees Neurology for MSA(multi-system atrophy). Patient on Sinemet with good response. No health issues today. Getting ready to go to [place] and [place], [place]!  No LMP recorded. Patient is postmenopausal.          Sexually active: no  The current method of family planning is abstinence.  Exercising: yes, stationary bike Smoker: no   Health Maintenance:  Pap: 01/03/12 neg HPV HR neg  MMG: 12/29/13, 3D, Bi-Rads 1: negative   Category B density Colonoscopy: 2010 negative 10 years  BMD: 11/2013, Dr. Brigitte Pulse, has appt with Pharm D to discuss options, but pt does not know results  TDaP: 05/2013 Labs:  Dr. Brigitte Pulse  Self breast exam: done occ    reports that she has never smoked. She has never used smokeless tobacco. She reports that she does not drink alcohol or use illicit drugs.  Past Medical History  Diagnosis Date  . Hypothyroidism   . Asthma   . Nephrolithiasis     hx of kidney stones, last 6 months ago  . Arthritis   . Blood clot in vein  age 34    left leg  . Anemia   . Multiple pregnancy loss, not currently pregnant     multiple miscarriages-pt.unsure of #  . GERD (gastroesophageal reflux disease)   . Osteopenia   . Hypertension     pt denies, in physician record  . Hyperlipidemia   . Tendinitis     left foot    Past Surgical History  Procedure Laterality Date  . Colonscopy  2011  . Joint replacement  05-2007    right knee replacment/revision  . Cystoscopy  2012  . Total knee revision  06/03/2012    Procedure: TOTAL KNEE REVISION;  Surgeon: Gearlean Alf, MD;  Location: [facility];  Service: Orthopedics;  Laterality: Left;  Revision of a Left Uni Knee to a Total Knee Arthroplasty  . Breast surgery      rt. lumpectomy,  benign  . Replacement total knee Left 06/05/12  . Kidney stone surgery      Current Outpatient Prescriptions  Medication Sig Dispense Refill  . CALCIUM LACTATE PO Take 6 tablets by mouth daily.      . carbidopa-levodopa (SINEMET IR) 25-100 MG per tablet Take 1 tablet by mouth 4 (four) times daily.  120 tablet  2  . Cholecalciferol (VITAMIN D-3 PO) Take 3 tablets by mouth daily.      . citalopram (CELEXA) 20 MG tablet Take 20 mg by mouth daily.       Marland Kitchen levothyroxine (SYNTHROID, LEVOTHROID) 88 MCG tablet Take 88 mcg by mouth at bedtime.      . Multiple Vitamin (MULTIVITAMIN WITH MINERALS) TABS tablet Take 2 tablets by mouth daily.      Marland Kitchen omeprazole (PRILOSEC OTC) 20 MG tablet Take 20 mg by mouth daily before breakfast.      . OVER THE COUNTER MEDICATION Take 1 tablet by mouth daily. Neurotrophine - 3 tablets daily Myoplus 4 tablets daily Cataplex F - 3 tablets daily      . Polyvinyl Alcohol-Povidone (REFRESH OP) Place 1 drop into both eyes daily as needed (dry eye).      Marland Kitchen PROAIR HFA 108 (90 BASE) MCG/ACT inhaler Inhale 2  puffs into the lungs every 4 (four) hours as needed. Wheezing and shortness of breath      . Probiotic Product (PROBIOTIC DAILY PO) Take 1 tablet by mouth daily.      Marland Kitchen PULMICORT FLEXHALER 90 MCG/ACT inhaler Inhale 2 puffs into the lungs daily.       Marland Kitchen QUERCETIN PO Take 4 tablets by mouth daily.      Marland Kitchen VALERIAN PO Take 4 tablets by mouth daily.       No current facility-administered medications for this visit.    Family History  Problem Relation Age of Onset  . Diabetes Mother   . Heart failure Mother     CHF  . Thyroid disease Mother   . Heart failure Father     CHF  . Breast cancer Sister     ROS:  Pertinent items are noted in HPI.  Otherwise, a comprehensive ROS was negative.  Exam:   BP 120/62  Pulse 72  Ht 5' 4.5" (1.638 m)  Wt 220 lb (99.791 kg)  BMI 37.19 kg/m2 Height: 5' 4.5" (163.8 cm)  Ht Readings from Last 3 Encounters:  01/20/14 5' 4.5" (1.638  m)  11/19/13 5\' 6"  (1.676 m)  10/13/13 5\' 6"  (1.676 m)    General appearance: alert, cooperative and appears stated age Head: Normocephalic, without obvious abnormality, atraumatic Neck: no adenopathy, supple, symmetrical, trachea midline and thyroid normal to inspection and palpation and non-palpable Lungs: clear to auscultation bilaterally Breasts: normal appearance, no masses or tenderness, No nipple retraction or dimpling, No nipple discharge or bleeding, No axillary or supraclavicular adenopathy Heart: regular rate and rhythm Abdomen: soft, non-tender; no masses,  no organomegaly Extremities: extremities normal, atraumatic, no cyanosis or edema Skin: Skin color, texture, turgor normal. No rashes or lesions Lymph nodes: Cervical, supraclavicular, and axillary nodes normal. No abnormal inguinal nodes palpated Neurologic: Grossly normal   Pelvic: External genitalia:  no lesions              Urethra:  normal appearing urethra with no masses, tenderness or lesions              Bartholin's and Skene's: normal                 Vagina: normal appearing vagina with normal color and discharge, no lesions              Cervix: normal, non tender              Pap taken: yes Bimanual Exam:  Uterus:  normal size, contour, position, consistency, mobility, non-tender and anteverted              Adnexa: normal adnexa and no mass, fullness, tenderness               Rectovaginal: Confirms               Anus:  normal sphincter tone, no lesions  A:  Well Woman with normal exam  Menopausal no HRT  MSA being managed by Dr.Tat  Hypothyroid managed by PCP  Family history of breast cancer sister late age onset  P:   Reviewed health and wellness pertinent to exam  Aware of need to evaluate if vaginal bleeding  Continue follow up as indicated  Pap smear taken today with HPV reflex   counseled on breast self exam, mammography screening, adequate intake of calcium and vitamin D, diet and  exercise  return annually or prn  An After Visit Summary was  printed and given to the patient.

## 2014-01-21 LAB — IPS PAP TEST WITH REFLEX TO HPV

## 2014-01-24 NOTE — Progress Notes (Signed)
Reviewed personally.  M. Suzanne Miller, MD.  

## 2014-01-25 ENCOUNTER — Ambulatory Visit: Payer: BC Managed Care – PPO | Admitting: Occupational Therapy

## 2014-01-26 ENCOUNTER — Ambulatory Visit: Payer: BC Managed Care – PPO | Attending: Neurology | Admitting: Occupational Therapy

## 2014-01-26 DIAGNOSIS — R4189 Other symptoms and signs involving cognitive functions and awareness: Secondary | ICD-10-CM | POA: Diagnosis not present

## 2014-01-26 DIAGNOSIS — R279 Unspecified lack of coordination: Secondary | ICD-10-CM | POA: Diagnosis not present

## 2014-01-26 DIAGNOSIS — Z5189 Encounter for other specified aftercare: Secondary | ICD-10-CM | POA: Diagnosis not present

## 2014-01-26 DIAGNOSIS — M2420 Disorder of ligament, unspecified site: Secondary | ICD-10-CM | POA: Insufficient documentation

## 2014-01-26 DIAGNOSIS — M629 Disorder of muscle, unspecified: Secondary | ICD-10-CM | POA: Diagnosis not present

## 2014-01-31 ENCOUNTER — Ambulatory Visit: Payer: BC Managed Care – PPO | Admitting: Occupational Therapy

## 2014-01-31 DIAGNOSIS — Z5189 Encounter for other specified aftercare: Secondary | ICD-10-CM | POA: Diagnosis not present

## 2014-02-07 ENCOUNTER — Encounter: Payer: BC Managed Care – PPO | Admitting: Occupational Therapy

## 2014-02-09 ENCOUNTER — Ambulatory Visit: Payer: BC Managed Care – PPO | Admitting: Occupational Therapy

## 2014-02-09 DIAGNOSIS — Z5189 Encounter for other specified aftercare: Secondary | ICD-10-CM | POA: Diagnosis not present

## 2014-02-10 ENCOUNTER — Ambulatory Visit: Payer: BC Managed Care – PPO | Admitting: Occupational Therapy

## 2014-02-10 DIAGNOSIS — Z5189 Encounter for other specified aftercare: Secondary | ICD-10-CM | POA: Diagnosis not present

## 2014-02-14 ENCOUNTER — Ambulatory Visit: Payer: BC Managed Care – PPO | Admitting: Occupational Therapy

## 2014-02-14 DIAGNOSIS — Z5189 Encounter for other specified aftercare: Secondary | ICD-10-CM | POA: Diagnosis not present

## 2014-02-16 ENCOUNTER — Ambulatory Visit: Payer: BC Managed Care – PPO | Admitting: Occupational Therapy

## 2014-02-16 DIAGNOSIS — Z5189 Encounter for other specified aftercare: Secondary | ICD-10-CM | POA: Diagnosis not present

## 2014-03-21 ENCOUNTER — Encounter: Payer: Self-pay | Admitting: Neurology

## 2014-03-21 ENCOUNTER — Ambulatory Visit (INDEPENDENT_AMBULATORY_CARE_PROVIDER_SITE_OTHER): Payer: BC Managed Care – PPO | Admitting: Neurology

## 2014-03-21 VITALS — BP 110/70 | HR 70 | Ht 65.0 in | Wt 218.2 lb

## 2014-03-21 DIAGNOSIS — G238 Other specified degenerative diseases of basal ganglia: Secondary | ICD-10-CM

## 2014-03-21 DIAGNOSIS — G232 Striatonigral degeneration: Secondary | ICD-10-CM

## 2014-03-21 DIAGNOSIS — G249 Dystonia, unspecified: Secondary | ICD-10-CM

## 2014-03-21 DIAGNOSIS — R259 Unspecified abnormal involuntary movements: Secondary | ICD-10-CM

## 2014-03-21 NOTE — Progress Notes (Signed)
Subjective:   Rachel [name] was seen in consultation in the movement disorder clinic at the request of Marton Redwood, MD.  The evaluation is for tremor.  The patient is a 63 y.o. right handed female with a history of tremor.  The pt reports that she first noted shaking in the legs and arms bilaterally about 1 - 1.5 years ago.  It was intermittent.  She now notices tremor of the hands, legs and head.  She has better and worse days.  She notes that lying in the recliner helps.  She notes that walking and exercise makes it worse.  The L leg feels that it will stiffen up and cause the tremor.  There is a family hx of tremor in her mother but it was undiagnosed.    Affected by caffeine:  yes (quit drinking caffeine all together) Exacerbated by albuterol:  Yes Affected by alcohol:  unknown Affected by stress:  yes Affected by fatigue:  yes Spills soup if on spoon:  yes Spills glass of liquid if full:  yes Affects ADL's (tying shoes, brushing teeth, etc):  no Affects ability to put on makeup:  Yes  09/1813 update:  The patient is following up today regarding tremor.  She is accompanied by her spouse, who supplements the history.  I have not seen the patient since her first appointment in August, 2014.  At that point in time, she did not want any medication for the tremor.  She did have an MRI of the brain in August, 2014 that was normal.  I did review this.  Over the course of time, she has noted tremor in the L foot and hand.  She states that if she uses the hands, her L leg will stiffen and will eventually hurt.  Minimal neck pain.  Has back pain but thinks that is independent.  Her bladder/bowel work okay.  Tremor is much better in the hands, L more than R, but it is better now that she is taking herbs that her chiropractor gave her.  She is receiving kinesiology treatment.  She feels that her walking is "awkward" because of the foot.  She has trouble with arm swing.  She reports some shakiness with  handwriting (R hand dominant) but no micrographia.  She is sleeping well.  She has no vivid dreams.  No sleep walking/talking.  No diplopia.  Does have hx of scintillating scotomas that is sometimes associated with migraine.  Denies swallowing problems.  No speech changes per pt but husband states that sometimes pt inserts one word for another or has word finding trouble.  Has "no smell" but able to taste, but thinks that there is some loss because of not being able to taste food.  She admits that she has been researching various causes of her symptoms, including Parkinson's and dystonias.  She requests a DaT scan.  11/19/13 update:  The patient is following up today, accompanied by her husband who helps to supplement the history.  Since our last visit, the patient did undergo a DaT scan.  No films are available.  However, I did get the report.  There was asymmetric uptake, with no uptake in the right putamen and normal on the left.  Pt opted to hold on the MRIs ordered last visit.  We did start carbidopa/levodopa last visit and the patient states that it is about 30% beneficial for her symptoms.   03/21/14 update:  This patient is accompanied in the office by her spouse who supplements  the history.  Last visit, I increased the patients carbidopa/levodopa 25/100 to qid but she hasn't really need to do that.  She is overall doing much better.  I did send her for PT/OT and that has helped.   She has had some constipation and been on miralax for that.  She states that she had severe "brain fog" yesterday and thinks that it is related to the levodopa.  She was driving and was confused about where she was.  She has had some other minor episodes prior to that of feeling confused but nothing like that.  She states that her chiropracter gave her "brain medicine" (2 of them, one she calls fito-brain B12) and she states that medicine allowed her arm to swing. She sees her chiropractor daily now and is very pleased with her  services.  The levodopa did help so her leg no longer tremors.   She does think that she has had her thyroid checked this year and states that her synthroid was increased last visit but cannot remember when that was.     Current/Previously tried tremor medications: tried propranolol on one occasion but seemed to make her sick and she did not want to take the next pill  Current medications that may exacerbate tremor:  proair  Outside reports reviewed: historical medical records.  Allergies  Allergen Reactions  . Symbicort [Budesonide-Formoterol Fumarate] Other (See Comments)    Shakes and muscle weakness    Current Outpatient Prescriptions on File Prior to Visit  Medication Sig Dispense Refill  . CALCIUM LACTATE PO Take 6 tablets by mouth daily.      . carbidopa-levodopa (SINEMET IR) 25-100 MG per tablet Take 1 tablet by mouth 3 (three) times daily.      . Cholecalciferol (VITAMIN D-3 PO) Take 3 tablets by mouth daily.      . citalopram (CELEXA) 20 MG tablet Take 20 mg by mouth daily.       Marland Kitchen levothyroxine (SYNTHROID, LEVOTHROID) 88 MCG tablet Take 88 mcg by mouth at bedtime.      . Multiple Vitamin (MULTIVITAMIN WITH MINERALS) TABS tablet Take 2 tablets by mouth daily.      Marland Kitchen omeprazole (PRILOSEC OTC) 20 MG tablet Take 20 mg by mouth daily before breakfast.      . OVER THE COUNTER MEDICATION Take 1 tablet by mouth daily. Neurotrophine - 3 tablets daily Myoplus 4 tablets daily Cataplex F - 3 tablets daily      . Polyvinyl Alcohol-Povidone (REFRESH OP) Place 1 drop into both eyes daily as needed (dry eye).      Marland Kitchen PROAIR HFA 108 (90 BASE) MCG/ACT inhaler Inhale 2 puffs into the lungs every 4 (four) hours as needed. Wheezing and shortness of breath      . Probiotic Product (PROBIOTIC DAILY PO) Take 1 tablet by mouth daily.      Marland Kitchen PULMICORT FLEXHALER 90 MCG/ACT inhaler Inhale 2 puffs into the lungs daily.       Marland Kitchen QUERCETIN PO Take 4 tablets by mouth daily.      Marland Kitchen VALERIAN PO Take 4 tablets  by mouth daily.       No current facility-administered medications on file prior to visit.    Past Medical History  Diagnosis Date  . Hypothyroidism   . Asthma   . Nephrolithiasis     hx of kidney stones, last 6 months ago  . Arthritis   . Blood clot in vein  age 20    left leg  .  Anemia   . Multiple pregnancy loss, not currently pregnant     multiple miscarriages-pt.unsure of #  . GERD (gastroesophageal reflux disease)   . Osteopenia   . Hypertension     pt denies, in physician record  . Hyperlipidemia   . Tendinitis     left foot  . Multiple system atrophy 2015    Past Surgical History  Procedure Laterality Date  . Colonscopy  2011  . Joint replacement  05-2007    right knee replacment/revision  . Cystoscopy  2012  . Total knee revision  06/03/2012    Procedure: TOTAL KNEE REVISION;  Surgeon: Gearlean Alf, MD;  Location: [facility];  Service: Orthopedics;  Laterality: Left;  Revision of a Left Uni Knee to a Total Knee Arthroplasty  . Breast surgery      rt. lumpectomy, benign  . Replacement total knee Left 06/05/12  . Kidney stone surgery      History   Social History  . Marital Status: Married    Spouse Name: N/A    Number of Children: N/A  . Years of Education: N/A   Occupational History  . retired     Research scientist (physical sciences)   Social History Main Topics  . Smoking status: Never Smoker   . Smokeless tobacco: Never Used  . Alcohol Use: No  . Drug Use: No  . Sexual Activity: Yes    Birth Control/ Protection: Post-menopausal   Other Topics Concern  . Not on file   Social History Narrative  . No narrative on file    Family Status  Relation Status Death Age  . Mother Deceased     fall, death during surgery  . Father Deceased     ? heart  . Brother Alive     healthy  . Sister Alive     2, asthma, heart problem  . Child Alive     adopted, healthy    Review of Systems A complete 10 system ROS was obtained and was negative apart from what is  mentioned.   Objective:   VITALS:   Filed Vitals:   03/21/14 0949  BP: 110/70  Pulse: 70  Height: 5\' 5"  (1.651 m)  Weight: 218 lb 4 oz (98.998 kg)  SpO2: 97%   Gen:  Appears stated age and in NAD. HEENT:  Normocephalic, atraumatic. The mucous membranes are moist. The superficial temporal arteries are without ropiness or tenderness. Cardiovascular: Regular rate and rhythm. Lungs: Clear to auscultation bilaterally. Neck: There are no carotid bruits noted bilaterally.  NEUROLOGICAL:  Orientation:  The patient is alert and oriented x 3.  Recent and remote memory are intact.  Attention span and concentration are normal.  Able to name objects and repeat without trouble.  Fund of knowledge is appropriate Cranial nerves: There is good facial symmetry. The pupils are equal round and reactive to light bilaterally. Fundoscopic exam reveals clear disc margins bilaterally. Extraocular muscles are intact and visual fields are full to confrontational testing. Speech is fluent and clear. Soft palate rises symmetrically and there is no tongue deviation. Hearing is intact to conversational tone. Tone: Tone is good throughout.  There is no spasticity or rigidity. Coordination:  The patient has some difficulty with finger taps on the left.  She really has no difficulty with hand opening and closing or alternation of supination/pronation of the forearm.   Motor: Strength is 5/5 in the bilateral upper and lower extremities.  Shoulder shrug is equal bilaterally.  There is no pronator  drift.  There are no fasciculations noted. DTR's: Deep tendon reflexes are 2+/4 at the bilateral biceps, triceps, brachioradialis, right patella, 2+-3 at the left patella with cross adductor reflex and trace at the bilateral achilles.  Plantar responses are downgoing bilaterally. Gait and Station: The patient is able to ambulate and is more stable.  MOVEMENT EXAM: Tremor:   No tremor at all today.  I did see intermittent dystonic  movements of the left foot, but this is not common.  Labs:  No results found for this basename: TSH        Assessment/Plan:   1. given the results of her DAT scan, the patient likely has MSA, with associated dystonia. She and I talked about the diagnosis.  She went back to carbidopa/levodopa 25/100 tid but can take it up to qid if needed.  I encouraged her to continue to exercise.  I don't think that this is causing the rare intermittent episodes of cognitive dulling/transient alteration of awareness.  She does remember that her synthroid was changed but cannot remember when.  I asked her to go home and look at the date it was changed and call me back and let me know.  She will.  We talked about potentially doing neuropsych testing as she was worried about her memory but decided to hold for now.  She doesn't think that the supplements the chiropractor gave her have caused problems in that regard and thinks that they have helped her.   Overall, she feels better than she has in years.  She is going on a long vacation to [place] at the end of September and I asked her to exercise on that and continue her regimen as she has been 2.  Return in about 4 months (around 07/21/2014).

## 2014-04-14 ENCOUNTER — Encounter: Payer: Self-pay | Admitting: Internal Medicine

## 2014-04-20 ENCOUNTER — Telehealth: Payer: Self-pay | Admitting: Neurology

## 2014-04-20 NOTE — Telephone Encounter (Signed)
Called pharmacy and they will get medication ready for patient. Patient made aware.

## 2014-04-20 NOTE — Telephone Encounter (Signed)
Pt needs to talk to someone about the mediation she will be leaving the [place] in 6 days and needs a rx for that will last 30 days carbidopa levodopa 25 100mg  [phone]

## 2014-04-28 ENCOUNTER — Telehealth: Payer: Self-pay | Admitting: Neurology

## 2014-04-28 NOTE — Telephone Encounter (Signed)
Spoke with patient and she states she flew to [place] yesterday. She had a headache all day. It started to diminish last night and her ears were hurting and her throat was feeling scratchy. She took some zinc. They had a nice dinner last night and she woke up this morning with a salty taste in her mouth. She can not taste anything at all. She states she feels the texture of food but has no taste whatsoever. She no longer has a headache or any other symptoms besides the no tasting. She woke up around 5 am and it is 9 am there. Please advise.

## 2014-04-28 NOTE — Telephone Encounter (Signed)
I honestly don't know.  While MSA/parkinsonism can cause loss of taste, it is not usually acute and even stroke doesn't cause acute loss of taste and no other sx's.  It just doesn't sound like a primary neurologic issue to me.  If shes worried and sx's continue, she should get it checked out while there.

## 2014-04-28 NOTE — Telephone Encounter (Signed)
Pt called requesting to speak to a nurse regarding an incident she had yesterday. C/B [phone]

## 2014-04-28 NOTE — Telephone Encounter (Signed)
Patient made aware. She will see an MD in [place] if needed.

## 2014-05-30 ENCOUNTER — Encounter: Payer: Self-pay | Admitting: Neurology

## 2014-05-30 ENCOUNTER — Telehealth: Payer: Self-pay | Admitting: Neurology

## 2014-05-30 NOTE — Telephone Encounter (Signed)
Patient called requesting a letter to be excused from jury duty. She states with the [place] and memory loss she does not think she can sit on a jury. Please advise.

## 2014-05-31 ENCOUNTER — Telehealth: Payer: Self-pay | Admitting: Neurology

## 2014-05-31 ENCOUNTER — Encounter: Payer: Self-pay | Admitting: Neurology

## 2014-05-31 NOTE — Telephone Encounter (Signed)
Carbidopa Levodopa refill requested. Per last office note- patient to remain on medication. Refill approved and sent to patient's pharmacy.   

## 2014-05-31 NOTE — Telephone Encounter (Signed)
Patient made aware letter written. She requested this be mailed to her home address. Letter mailed. She will call with any other questions.

## 2014-05-31 NOTE — Telephone Encounter (Signed)
Pt calling for refill of Carbidopa.Levadopa. Uses [facility] on [address]. CB# [phone] / Sherri S.

## 2014-05-31 NOTE — Telephone Encounter (Signed)
I wrote this and it is under letters

## 2014-06-06 ENCOUNTER — Telehealth: Payer: Self-pay

## 2014-06-06 ENCOUNTER — Encounter: Payer: Self-pay | Admitting: Certified Nurse Midwife

## 2014-06-06 ENCOUNTER — Ambulatory Visit (INDEPENDENT_AMBULATORY_CARE_PROVIDER_SITE_OTHER): Payer: BC Managed Care – PPO | Admitting: Certified Nurse Midwife

## 2014-06-06 ENCOUNTER — Telehealth: Payer: Self-pay | Admitting: Neurology

## 2014-06-06 VITALS — BP 124/80 | HR 72 | Resp 16 | Ht 64.25 in | Wt 215.0 lb

## 2014-06-06 DIAGNOSIS — B3731 Acute candidiasis of vulva and vagina: Secondary | ICD-10-CM

## 2014-06-06 DIAGNOSIS — B373 Candidiasis of vulva and vagina: Secondary | ICD-10-CM

## 2014-06-06 MED ORDER — TERCONAZOLE 0.4 % VA CREA: 1.0000 | TOPICAL_CREAM | Freq: Every day | VAGINAL | Status: DC

## 2014-06-06 NOTE — Telephone Encounter (Signed)
Spoke with patient at time of incoming call. Patient states that she just spent one month traveling abroad. Upon return has started to experience "a lot of pain vaginally. It is extremely uncomfortable." Denies any urinary symptoms. Has been having vaginal itching and irritation. "It is all outside of the vagina. I want to come in as soon as possible to have it looked at." Appointment scheduled for today at 10:45am with Regina Eck CNM. Patient is agreeable to date and time.  Routing to provider for final review. Patient agreeable to disposition. Will close encounter

## 2014-06-06 NOTE — Patient Instructions (Signed)

## 2014-06-06 NOTE — Telephone Encounter (Signed)
No, that should not be associated with MSA.  Would f/u with PCP

## 2014-06-06 NOTE — Progress Notes (Signed)
63 y.o. married white female g0p0 here with complaint of vaginal symptoms of itching, burning, and slight increase discharge. Describes discharge as yellow in color. Denies vaginal bleeding or vaginal dryness.wear cotton underwear. Onset of symptoms 4 days ago. Denies new personal products or vaginal dryness.. Urinary symptoms none that she is aware. Patient just returned from extended cruise, where she was concerned about cleanliness. Recovering viral illness now. No other health issues today.  O:Healthy female WDWN Affect: normal, orientation x 3  Exam: Abdomen:soft, non tender Lymph node: no enlargement or tenderness Pelvic exam: External genital: slight increase in red, some scaling with exudate, no lesions wet prep taken BUS: negative Vagina: white thick non odorous discharge noted. Ph:4.5   ,Wet prep taken Cervix: normal, non tender Uterus: normal, non tender Adnexa:normal, non tender, no masses or fullness noted   Wet Prep results: positive for yeast vaginal and vulva   A: Yeast vaginitis/vulvitis   P:Discussed findings of yeast vaginitis/vulvitis and etiology. Discussed Aveeno or baking soda sitz bath for comfort. Avoid moist clothes for extended period of time. If working out in gym clothes for long periods of time change underwear.  Olive Oil use for skin protection prior to activity can be used to external skin. Rx: Terazol 7 see order with instructions Discussed can also use Aveeno anti itch cream with moisture on external skin for comfort. Questions addressed.  Rv prn

## 2014-06-06 NOTE — Telephone Encounter (Signed)
Spoke with patient and she states since vacation she is having episodes of vomiting that come out of no where when she doesn't even feel sick. She wanted to know if this could be from the [place]. I advised that I don't think it is related and I would advise her to call her PCP, but I would make Dr Tat aware it is happening and call her back with any concerns.

## 2014-06-06 NOTE — Telephone Encounter (Signed)
Pt called requesting to speak to a nurse regarding a medical problem she is experiencing. Please call pt # [phone]

## 2014-06-07 NOTE — Progress Notes (Signed)
Reviewed personally.  M. Suzanne Melanie Pellot, MD.  

## 2014-06-20 ENCOUNTER — Encounter: Payer: Self-pay | Admitting: Physician Assistant

## 2014-06-28 ENCOUNTER — Ambulatory Visit: Payer: BC Managed Care – PPO | Admitting: Physician Assistant

## 2014-07-11 ENCOUNTER — Encounter: Payer: Self-pay | Admitting: Neurology

## 2014-07-11 ENCOUNTER — Ambulatory Visit (INDEPENDENT_AMBULATORY_CARE_PROVIDER_SITE_OTHER): Payer: BC Managed Care – PPO | Admitting: Neurology

## 2014-07-11 ENCOUNTER — Telehealth: Payer: Self-pay | Admitting: Neurology

## 2014-07-11 VITALS — BP 122/72 | HR 84 | Ht 66.0 in | Wt 214.0 lb

## 2014-07-11 DIAGNOSIS — G238 Other specified degenerative diseases of basal ganglia: Secondary | ICD-10-CM

## 2014-07-11 DIAGNOSIS — G232 Striatonigral degeneration: Secondary | ICD-10-CM

## 2014-07-11 NOTE — Addendum Note (Signed)
Addended byAnnamaria Helling on: 07/11/2014 11:16 AM   Modules accepted: Orders

## 2014-07-11 NOTE — Progress Notes (Signed)
Subjective:   Rachel [name] was seen in consultation in the movement disorder clinic at the request of Marton Redwood, MD.  The evaluation is for tremor.  The patient is a 63 y.o. right handed female with a history of tremor.  The pt reports that she first noted shaking in the legs and arms bilaterally about 1 - 1.5 years ago.  It was intermittent.  She now notices tremor of the hands, legs and head.  She has better and worse days.  She notes that lying in the recliner helps.  She notes that walking and exercise makes it worse.  The L leg feels that it will stiffen up and cause the tremor.  There is a family hx of tremor in her mother but it was undiagnosed.    Affected by caffeine:  yes (quit drinking caffeine all together) Exacerbated by albuterol:  Yes Affected by alcohol:  unknown Affected by stress:  yes Affected by fatigue:  yes Spills soup if on spoon:  yes Spills glass of liquid if full:  yes Affects ADL's (tying shoes, brushing teeth, etc):  no Affects ability to put on makeup:  Yes  09/1813 update:  The patient is following up today regarding tremor.  She is accompanied by her spouse, who supplements the history.  I have not seen the patient since her first appointment in August, 2014.  At that point in time, she did not want any medication for the tremor.  She did have an MRI of the brain in August, 2014 that was normal.  I did review this.  Over the course of time, she has noted tremor in the L foot and hand.  She states that if she uses the hands, her L leg will stiffen and will eventually hurt.  Minimal neck pain.  Has back pain but thinks that is independent.  Her bladder/bowel work okay.  Tremor is much better in the hands, L more than R, but it is better now that she is taking herbs that her chiropractor gave her.  She is receiving kinesiology treatment.  She feels that her walking is "awkward" because of the foot.  She has trouble with arm swing.  She reports some shakiness with  handwriting (R hand dominant) but no micrographia.  She is sleeping well.  She has no vivid dreams.  No sleep walking/talking.  No diplopia.  Does have hx of scintillating scotomas that is sometimes associated with migraine.  Denies swallowing problems.  No speech changes per pt but husband states that sometimes pt inserts one word for another or has word finding trouble.  Has "no smell" but able to taste, but thinks that there is some loss because of not being able to taste food.  She admits that she has been researching various causes of her symptoms, including Parkinson's and dystonias.  She requests a DaT scan.  11/19/13 update:  The patient is following up today, accompanied by her husband who helps to supplement the history.  Since our last visit, the patient did undergo a DaT scan.  No films are available.  However, I did get the report.  There was asymmetric uptake, with no uptake in the right putamen and normal on the left.  Pt opted to hold on the MRIs ordered last visit.  We did start carbidopa/levodopa last visit and the patient states that it is about 30% beneficial for her symptoms.   03/21/14 update:  This patient is accompanied in the office by her spouse who supplements  the history.  Last visit, I increased the patients carbidopa/levodopa 25/100 to qid but she hasn't really need to do that.  She is overall doing much better.  I did send her for PT/OT and that has helped.   She has had some constipation and been on miralax for that.  She states that she had severe "brain fog" yesterday and thinks that it is related to the levodopa.  She was driving and was confused about where she was.  She has had some other minor episodes prior to that of feeling confused but nothing like that.  She states that her chiropracter gave her "brain medicine" (2 of them, one she calls fito-brain B12) and she states that medicine allowed her arm to swing. She sees her chiropractor daily now and is very pleased with her  services.  The levodopa did help so her leg no longer tremors.   She does think that she has had her thyroid checked this year and states that her synthroid was increased last visit but cannot remember when that was.    07/11/14 update:  The patient is following up today, accompanied by her husband who supplements the history.  The patient is currently on carbidopa/levodopa 25/100, 4 times per day (went back up from 3 times per day because of "jerking" and foot dystonia).  I reviewed records since her last visit.  She was in her wife for vacation in October when she called me.  She stated that she initially had a scratchy throat and then suddenly she was not able to taste.  This sounded viral to me, but I told her that nonetheless it was not related to [place], as symptoms do not come on that acutely.  I told her that she could follow up with a physician in [place].   She states that it took 6-8 weeks to go away, but her husband got the same thing.   She is not falling currently but thinks that her balance is really not good.  She cannot remember the last PT date.  She still has trouble with her memory.  That is her biggest c/o.  She is having word finding trouble.     Current/Previously tried tremor medications: tried propranolol on one occasion but seemed to make her sick and she did not want to take the next pill  Current medications that may exacerbate tremor:  proair  Outside reports reviewed: historical medical records.  Allergies  Allergen Reactions  . Symbicort [Budesonide-Formoterol Fumarate] Other (See Comments)    Shakes and muscle weakness    Current Outpatient Prescriptions on File Prior to Visit  Medication Sig Dispense Refill  . CALCIUM LACTATE PO Take 6 tablets by mouth daily.    . carbidopa-levodopa (SINEMET IR) 25-100 MG per tablet TAKE ONE TABLET BY MOUTH THREE TIMES DAILY (Patient taking differently: TAKE ONE TABLET BY MOUTH four TIMES DAILY) 90 tablet 5  . Cholecalciferol  (VITAMIN D-3 PO) Take 3 tablets by mouth daily.    . citalopram (CELEXA) 20 MG tablet Take 20 mg by mouth daily.     Marland Kitchen levothyroxine (SYNTHROID, LEVOTHROID) 88 MCG tablet Take 88 mcg by mouth at bedtime.    . Multiple Vitamin (MULTIVITAMIN WITH MINERALS) TABS tablet Take 2 tablets by mouth daily.    Marland Kitchen omeprazole (PRILOSEC OTC) 20 MG tablet Take 20 mg by mouth daily before breakfast.    . OVER THE COUNTER MEDICATION Take 1 tablet by mouth daily. Neurotrophine - 3 tablets daily Myoplus 4  tablets daily Cataplex F - 3 tablets daily    . OVER THE COUNTER MEDICATION Phyto Brain EK 5 L    . Probiotic Product (PROBIOTIC DAILY PO) Take 1 tablet by mouth daily.    Marland Kitchen QUERCETIN PO Take 4 tablets by mouth daily.    . VOLTAREN 1 % GEL      No current facility-administered medications on file prior to visit.    Past Medical History  Diagnosis Date  . Hypothyroidism   . Asthma   . Nephrolithiasis     hx of kidney stones, last 6 months ago  . Arthritis   . Blood clot in vein  age 16    left leg  . Anemia   . Multiple pregnancy loss, not currently pregnant     multiple miscarriages-pt.unsure of #  . GERD (gastroesophageal reflux disease)   . Osteopenia   . Hypertension     pt denies, in physician record  . Hyperlipidemia   . Tendinitis     left foot  . Multiple system atrophy 2015    Past Surgical History  Procedure Laterality Date  . Colonscopy  2011  . Joint replacement  05-2007    right knee replacment/revision  . Cystoscopy  2012  . Total knee revision  06/03/2012    Procedure: TOTAL KNEE REVISION;  Surgeon: Gearlean Alf, MD;  Location: [facility];  Service: Orthopedics;  Laterality: Left;  Revision of a Left Uni Knee to a Total Knee Arthroplasty  . Breast surgery      rt. lumpectomy, benign  . Replacement total knee Left 06/05/12  . Kidney stone surgery      History   Social History  . Marital Status: Married    Spouse Name: N/A    Number of Children: N/A  . Years of  Education: N/A   Occupational History  . retired     Research scientist (physical sciences)   Social History Main Topics  . Smoking status: Never Smoker   . Smokeless tobacco: Never Used  . Alcohol Use: No  . Drug Use: No  . Sexual Activity:    Partners: Male    Birth Control/ Protection: Post-menopausal   Other Topics Concern  . Not on file   Social History Narrative    Family Status  Relation Status Death Age  . Mother Deceased     fall, death during surgery  . Father Deceased     ? heart  . Brother Alive     healthy  . Sister Alive     2, asthma, heart problem  . Child Alive     adopted, healthy    Review of Systems A complete 10 system ROS was obtained and was negative apart from what is mentioned.   Objective:   VITALS:   Filed Vitals:   07/11/14 1014  BP: 122/72  Pulse: 84  Height: 5\' 6"  (1.676 m)  Weight: 214 lb (97.07 kg)   Wt Readings from Last 3 Encounters:  07/11/14 214 lb (97.07 kg)  06/06/14 215 lb (97.523 kg)  03/21/14 218 lb 4 oz (98.998 kg)     Gen:  Appears stated age and in NAD. HEENT:  Normocephalic, atraumatic. The mucous membranes are moist. The superficial temporal arteries are without ropiness or tenderness. Cardiovascular: Regular rate and rhythm. Lungs: Clear to auscultation bilaterally. Neck: There are no carotid bruits noted bilaterally.  NEUROLOGICAL:  Orientation:  The patient is alert and oriented x 3.  Recent and remote memory are intact.  Attention span and concentration are normal.  Able to name objects and repeat without trouble.  Fund of knowledge is appropriate Cranial nerves: There is good facial symmetry.  Extraocular muscles are intact and visual fields are full to confrontational testing. Speech is fluent and clear. Soft palate rises symmetrically and there is no tongue deviation. Hearing is intact to conversational tone. Tone: Tone is good throughout.  There is no spasticity or rigidity. Coordination:  The patient has some difficulty  with finger taps on the left (minor).  She really has no difficulty with hand opening and closing or alternation of supination/pronation of the forearm.   Motor: Strength is 5/5 in the bilateral upper and lower extremities.  Shoulder shrug is equal bilaterally.  There is no pronator drift.  There are no fasciculations noted. Gait and Station: The patient is able to ambulate and is more stable.  MOVEMENT EXAM: Tremor:   No tremor at all today.  No significant foot dystonia today seen  Labs:  No results found for: TSH      Assessment/Plan:   1.  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  She and I talked about the diagnosis.  She went back to carbidopa/levodopa 25/100 qid.  I encouraged her to continue to exercise.    -Wants to wait to restart PT until January and we will send the order then.      2.  Cognitive impairment  -We talked about potentially doing neuropsych testing as she was worried about her memory and we will schedule that in [place].   She doesn't think that the supplements the chiropractor gave her have caused problems in that regard and thinks that they have helped her.  Don't see evidence of dementia but could be MCI or even pseudodementia.  Will await testing.     3.  Return in about 4 months (around 11/10/2014).

## 2014-07-11 NOTE — Patient Instructions (Signed)
1. You have been referred to Dr Nathanial Rancher for Neuro Psych testing for memory loss. They will call you directly to schedule an appointment. Please call [phone] if you do not hear from them.  2. You have been referred to [facility] for PT. They will call you directly to schedule an appointment for January.  Please call [phone] if you do not hear from them.

## 2014-07-11 NOTE — Telephone Encounter (Signed)
Referral for neuro psych testing with Dr Conley Canal faxed to [phone] with confirmation received. They will contact patient with appt.

## 2014-07-26 ENCOUNTER — Telehealth: Payer: Self-pay | Admitting: Neurology

## 2014-07-26 MED ORDER — CARBIDOPA-LEVODOPA 25-100 MG PO TABS: 1.0000 | ORAL_TABLET | Freq: Four times a day (QID) | ORAL | Status: DC

## 2014-07-26 NOTE — Telephone Encounter (Signed)
RX sent to pharmacy. Patient made aware.

## 2014-07-26 NOTE — Telephone Encounter (Signed)
Pt called requesting a refill for carbidopa-levodopa 25-100mg  and to make sure it is 4x a day.  Pharmacy: [facility] [address]  C/b [phone]

## 2014-08-02 ENCOUNTER — Telehealth: Payer: Self-pay | Admitting: Neurology

## 2014-08-02 DIAGNOSIS — G903 Multi-system degeneration of the autonomic nervous system: Secondary | ICD-10-CM

## 2014-08-02 DIAGNOSIS — G239 Degenerative disease of basal ganglia, unspecified: Principal | ICD-10-CM

## 2014-08-02 DIAGNOSIS — H532 Diplopia: Secondary | ICD-10-CM

## 2014-08-02 NOTE — Telephone Encounter (Signed)
Dr. Jolyn Nap, [facility] neuro-ophthalmology.  Dx:  MSA with diplopia

## 2014-08-02 NOTE — Telephone Encounter (Signed)
She seemed to be open to whatever we advised her to do. Where would you like me to send her?

## 2014-08-02 NOTE — Addendum Note (Signed)
Addended byAnnamaria Helling on: 08/02/2014 03:29 PM   Modules accepted: Orders

## 2014-08-02 NOTE — Telephone Encounter (Signed)
The diplopia is part of [place] and she is probably right, that it is not tracking right at times.  I think that I would like to set her up with a neuro-ophthalmologist.  Would she be okay with that?

## 2014-08-02 NOTE — Telephone Encounter (Signed)
This is Dr Carles Collet 's patient

## 2014-08-02 NOTE — Telephone Encounter (Signed)
Called and spoke with patient. She states for the last month off and on she has had strange sensations in just her left eye. She states sometimes she has double vision. She will sometimes feel a pulling sensation where her eye does not "track" correctly. It sometimes opens slower than the other eye and she has a feeling of warmth in it. Patient does have an ophthalmologist. I encouraged her to call and have an eye exam done and if they recommend following up sooner with [place] that we could make sooner appt than April. Cancelled appt for tomorrow. Aware I will forward message to Dr Tat and we will call back if she has further recommendations.

## 2014-08-02 NOTE — Telephone Encounter (Signed)
Patient agreed with referral. Appt made with Dr Jolyn Nap for 08/30/14 at 8:20 am. His ph# [phone]. They will mail packet of information to patient. He is located at [facility] - 6th floor.

## 2014-08-02 NOTE — Telephone Encounter (Signed)
Pt called requesting to speak to a nurse regarding her current condition. Her eye is acting up. C/b [phone]

## 2014-08-03 ENCOUNTER — Telehealth: Payer: Self-pay | Admitting: *Deleted

## 2014-08-03 ENCOUNTER — Ambulatory Visit: Payer: Self-pay | Admitting: Neurology

## 2014-08-03 NOTE — Telephone Encounter (Signed)
Spoke with patient and relayed appt information from previous message.

## 2014-08-03 NOTE — Telephone Encounter (Signed)
Left message on machine for patient to call back.

## 2014-08-03 NOTE — Telephone Encounter (Signed)
Patient called in reference to a referral made she would like for Dr. Carles Collet to call and get it expedited.   Call back number [phone]

## 2014-08-05 ENCOUNTER — Telehealth: Payer: Self-pay | Admitting: Neurology

## 2014-08-05 NOTE — Telephone Encounter (Signed)
Per pt she received a message to call our office, please call back at [phone] / Sherri S.

## 2014-08-05 NOTE — Telephone Encounter (Signed)
I have not called patient.  

## 2014-08-15 ENCOUNTER — Ambulatory Visit: Payer: BLUE CROSS/BLUE SHIELD | Attending: Neurology | Admitting: Physical Therapy

## 2014-08-15 ENCOUNTER — Encounter: Payer: Self-pay | Admitting: Physical Therapy

## 2014-08-15 DIAGNOSIS — G249 Dystonia, unspecified: Secondary | ICD-10-CM | POA: Insufficient documentation

## 2014-08-15 DIAGNOSIS — R269 Unspecified abnormalities of gait and mobility: Secondary | ICD-10-CM | POA: Insufficient documentation

## 2014-08-15 NOTE — Therapy (Signed)
[facility] [address] [place], [place], [address] Phone: [phone]   Fax:  [phone]  Physical Therapy Evaluation  Patient Details  Name: Rachel [name] MRN: [id number] Date of Birth: 1950/08/28 Referring Provider:  Marton Redwood, MD  Encounter Date: 08/15/2014      PT End of Session - 08/15/14 1117    Visit Number 1   Number of Visits 9   Date for PT Re-Evaluation 10/14/14   PT Start Time 1016   PT Stop Time 1100   PT Time Calculation (min) 44 min      Past Medical History  Diagnosis Date  . Hypothyroidism   . Asthma   . Nephrolithiasis     hx of kidney stones, last 6 months ago  . Arthritis   . Blood clot in vein  age 57    left leg  . Anemia   . Multiple pregnancy loss, not currently pregnant     multiple miscarriages-pt.unsure of #  . GERD (gastroesophageal reflux disease)   . Osteopenia   . Hypertension     pt denies, in physician record  . Hyperlipidemia   . Tendinitis     left foot  . Multiple system atrophy 2015    Past Surgical History  Procedure Laterality Date  . Colonscopy  2011  . Joint replacement  05-2007    right knee replacment/revision  . Cystoscopy  2012  . Total knee revision  06/03/2012    Procedure: TOTAL KNEE REVISION;  Surgeon: Gearlean Alf, MD;  Location: [facility];  Service: Orthopedics;  Laterality: Left;  Revision of a Left Uni Knee to a Total Knee Arthroplasty  . Breast surgery      rt. lumpectomy, benign  . Replacement total knee Left 06/05/12  . Kidney stone surgery      There were no vitals taken for this visit.  Visit Diagnosis:  Abnormality of gait - Plan: PT plan of care cert/re-cert  Dystonia of foot - Plan: PT plan of care cert/re-cert      Subjective Assessment - 08/15/14 1021    Symptoms Pt has Multiple system atrophy, with complaints of balance being not good.  She reports days when balance is more off than others.  She reports some days where  she has L eye twitching and difficulty tracking.  She reports no falls in the past 6 months.  She has a cane, but she does not use often.  She reports not doing well on uneven terrain or in crowds.   Patient Stated Goals Pt's goal for therapy is to keep mobile, keep moving as much as possible.   Currently in Pain? No/denies          [facility] PT Assessment - 08/15/14 1025    Assessment   Medical Diagnosis Parkinson's disease/MSA   Precautions   Precautions Fall   Balance Screen   Has the patient fallen in the past 6 months No   Has the patient had a decrease in activity level because of a fear of falling?  No   Is the patient reluctant to leave their home because of a fear of falling?  No   Home Environment   Living Enviornment Private residence   Living Arrangements Spouse/significant other   Available Help at Discharge Family   Type of Lane to enter   Entrance Stairs-Number of Steps 2  lower steps than the normal height   Entrance Stairs-Rails None  Home Layout One level   [place] - single point;Walker - 2 wheels;Grab bars - toilet  walk in tub   Prior Function   Level of Independence Independent with basic ADLs;Independent with homemaking with ambulation;Independent with gait;Independent with transfers   Leisure Has exercise bike   Observation/Other Assessments   Focus on Therapeutic Outcomes (FOTO)  Functional Intake score 61; Neuro QOL score 39   Tone Assessment - Other   Other Tone Location Pt notes L dystonia in L foot, with pt noticing increased dystonia recently.  She notices it during walking, causing narrow BOS.   Strength   Right Hip Flexion 5/5   Left Hip Flexion 5/5   Right Knee Flexion 5/5   Right Knee Extension 5/5   Left Knee Flexion 5/5   Left Knee Extension 5/5   Right Ankle Dorsiflexion 5/5   Right Ankle Eversion 4+/5   Left Ankle Dorsiflexion 5/5   Left Ankle Eversion 4/5   Transfers   Transfers Sit to Stand;Stand  to Sit   Sit to Stand From chair/3-in-1;Five times sit to stand;7: Independent;Without upper extremity assist  5x sit<>stand:  14.06 seconds   Stand to Sit To chair/3-in-1;Without upper extremity assist;7: Independent  describes difficulty with low surface transfers   Ambulation/Gait   Ambulation/Gait Yes   Ambulation/Gait Assistance 7: Independent   Ambulation Distance (Feet) 200 Feet   Assistive device None   Gait Pattern Narrow base of support  increased L foot internal rotation   Ambulation Surface Level   Gait velocity 10.91 sec= 3 ft/sec   Gait velocity - backwards 6.1 s=1.64 ft/sec   Stairs Yes   Stairs Assistance 6: Modified independent (Device/Increase time)   Stair Management Technique Two rails;Alternating pattern   Number of Stairs 4   Height of Stairs 6   Balance   Balance Assessed Yes   Static Standing Balance   Static Standing - Balance Support No upper extremity supported   High Level Balance   High Level Balance Activities Other (comment)   High Level Balance Comments Pt stands on solid surface with eyes open/eyes closed 30 seconds; on foam surface eyes open and eyes closed 30 seconds (with increased sway and with supervision)   Standardized Balance Assessment   Standardized Balance Assessment Timed Up and Go Test   Timed Up and Go Test   TUG Normal TUG;Manual TUG;Cognitive TUG   Normal TUG (seconds) 12.72   Manual TUG (seconds) 11.04   Cognitive TUG (seconds) 11.63   Functional Gait  Assessment   Gait assessed  Yes  Score 16/30 (<22/30 indicate incr. fall risk)   Gait Level Surface Walks 20 ft, slow speed, abnormal gait pattern, evidence for imbalance or deviates 10-15 in outside of the 12 in walkway width. Requires more than 7 sec to ambulate 20 ft.  7.36 sec   Change in Gait Speed Able to smoothly change walking speed without loss of balance or gait deviation. Deviate no more than 6 in outside of the 12 in walkway width.   Gait with Horizontal Head Turns  Performs head turns with moderate changes in gait velocity, slows down, deviates 10-15 in outside 12 in walkway width but recovers, can continue to walk.   Gait with Vertical Head Turns Performs task with moderate change in gait velocity, slows down, deviates 10-15 in outside 12 in walkway width but recovers, can continue to walk.   Gait and Pivot Turn Pivot turns safely within 3 sec and stops quickly with no loss  of balance.   Step Over Obstacle Is able to step over one shoe box (4.5 in total height) but must slow down and adjust steps to clear box safely. May require verbal cueing.   Gait with Narrow Base of Support Ambulates less than 4 steps heel to toe or cannot perform without assistance.   Gait with Eyes Closed Walks 20 ft, slow speed, abnormal gait pattern, evidence for imbalance, deviates 10-15 in outside 12 in walkway width. Requires more than 9 sec to ambulate 20 ft.  9.61 sec   Ambulating Backwards Walks 20 ft, no assistive devices, good speed, no evidence for imbalance, normal gait   Steps Alternating feet, must use rail.   Total Score 16                               PT Long Term Goals - 08/15/14 1128    PT LONG TERM GOAL #1   Title Pt will be independent with HEP to address balance, stretching, strengthening.   Time 5   Period Weeks   Status New   PT LONG TERM GOAL #2   Title Pt will improve Functional Gait Assessment to at least 20/30 for decreased fall risk.   Time 5   Period Weeks   Status New   PT LONG TERM GOAL #3   Title Pt will report improvement on Neuro QOL scale by at least 10% for improved overall functional mobility.   Time 5   Period Weeks   Status New   PT LONG TERM GOAL #4   Title Pt will verbalize plans for continued community fitness upon D/C from PT.   Time 5   Period Weeks   Status New               Plan - 08/15/14 1122    Clinical Impression Statement Pt is a 64 year old female who presents to OP PT with diagnosis  of MSA/Parkinson's variant.  She presents with occasional L foot dystonia, with difficulties with dynamic balance.  She has had no falls in the past 6 months.  She feels that the turning in of her L foot is beginning to affect  her L knee, which she has had replaced.  Pt's goal is to stay as mobile as possible.   Pt will benefit from skilled therapeutic intervention in order to improve on the following deficits Abnormal gait;Decreased balance;Difficulty walking;Impaired tone  dystonia L foot   Rehab Potential Good   PT Frequency 2x / week  1x/wk for 1 wk, then 2x/wk for 4 weeks   PT Duration 4 weeks  plus eval   PT Treatment/Interventions ADLs/Self Care Home Management;Functional mobility training;Gait training;Balance training;Neuromuscular re-education;Therapeutic activities;Therapeutic exercise;Patient/family education   PT Next Visit Plan Initiate HEP for stretching/strengthening L ankle; look into bracing possibilities to address L dystonia; standing balance activities on varied surfaces   Consulted and Agree with Plan of Care Patient         Problem List Patient Active Problem List   Diagnosis Date Noted  . Multiple system atrophy, Parkinson variant 11/19/2013  . Thrush 06/15/2012  . S/P total knee arthroplasty 06/15/2012  . Fall 06/13/2012  . Urinary incontinence 06/13/2012  . Postop Acute blood loss anemia 06/04/2012  . Postop Hypokalemia 06/04/2012  . Pain due to unicompartmental arthroplasty of knee 06/03/2012  . GERD 03/18/2008  . GI BLEED 03/18/2008  . DYSPHAGIA 03/18/2008  . FECAL OCCULT BLOOD  03/18/2008    Reni Hausner W. 08/15/2014, 11:35 AM   Mady Haagensen, PT 08/15/2014 11:36 AM Phone: [phone] Fax: [facility] [address] [place], [place], [address] Phone: [phone]   Fax:  [phone]

## 2014-08-22 ENCOUNTER — Ambulatory Visit: Payer: BLUE CROSS/BLUE SHIELD | Admitting: Physical Therapy

## 2014-08-24 ENCOUNTER — Ambulatory Visit: Payer: BLUE CROSS/BLUE SHIELD | Admitting: Physical Therapy

## 2014-08-24 DIAGNOSIS — R269 Unspecified abnormalities of gait and mobility: Secondary | ICD-10-CM

## 2014-08-24 DIAGNOSIS — G249 Dystonia, unspecified: Secondary | ICD-10-CM

## 2014-08-24 NOTE — Patient Instructions (Signed)
Ice massage to left iliotibial (IT) band :  Take off pants and have a towel under your legs. Lie on your right side. Peel away the styrofoam of the cup 1/2 filled with ice until the ice is exposed. Have husband gently perform ice massage to painful area along IT band on left leg for about 5 minutes.  You can do this 1-2 times per day.   Seated ankle eversion (pulling foot outwards) -place theraband around left foot and anchor the band with right foot -keeping your knee facing straight ahead, pull your foot out to the side and hold 3 seconds against the resistance of the band -Repeat 10 reps, 2-3 sets  Seated ankle dorsiflexion (pulling foot upwards) -with theraband in position above, pull your foot up towards your body (your heel should be on the ground).  Hold 3 seconds against the resistance. -Repeat 10 reps, 2-3 sets

## 2014-08-24 NOTE — Therapy (Signed)
[facility] [address] [place], [place], [address] Phone: [phone]   Fax:  [phone]  Physical Therapy Treatment  Patient Details  Name: Rachel [name] MRN: [id number] Date of Birth: Mar 05, 1951 Referring Provider:  Marton Redwood, MD  Encounter Date: 08/24/2014      PT End of Session - 08/25/14 1504    Visit Number 2   Number of Visits 9   Date for PT Re-Evaluation 10/14/14   PT Start Time [id number]   PT Stop Time 0933   PT Time Calculation (min) 38 min   Activity Tolerance Patient tolerated treatment well  pain along palpation and massage along IT band      Past Medical History  Diagnosis Date  . Hypothyroidism   . Asthma   . Nephrolithiasis     hx of kidney stones, last 6 months ago  . Arthritis   . Blood clot in vein  age 64    left leg  . Anemia   . Multiple pregnancy loss, not currently pregnant     multiple miscarriages-pt.unsure of #  . GERD (gastroesophageal reflux disease)   . Osteopenia   . Hypertension     pt denies, in physician record  . Hyperlipidemia   . Tendinitis     left foot  . Multiple system atrophy 2015    Past Surgical History  Procedure Laterality Date  . Colonscopy  2011  . Joint replacement  05-2007    right knee replacment/revision  . Cystoscopy  2012  . Total knee revision  06/03/2012    Procedure: TOTAL KNEE REVISION;  Surgeon: Gearlean Alf, MD;  Location: [facility];  Service: Orthopedics;  Laterality: Left;  Revision of a Left Uni Knee to a Total Knee Arthroplasty  . Breast surgery      rt. lumpectomy, benign  . Replacement total knee Left 06/05/12  . Kidney stone surgery      There were no vitals taken for this visit.  Visit Diagnosis:  Abnormality of gait  Dystonia of foot      Subjective Assessment - 08/24/14 0856    Symptoms No falls, no changes since last visit.  She does report back pain, with onset 2 days ago.  She says it is eing managed by  chiropractor.   Currently in Pain? Yes   Pain Score 5    Pain Location Back   Pain Orientation Mid;Lower   Pain Descriptors / Indicators --  grabbing pain in certain movements   Pain Type Acute pain   Pain Onset In the past 7 days   Pain Frequency Occasional   Aggravating Factors  first getting up from sitting and up from bed; worse in the morning   Pain Relieving Factors moving around and exercise alleviates pain  Pt reports chiropractor is addressing       Gait training: Gait x 600 ft no device, with pt reporting increased L foot internal rotation, creating "torque" in L knee and leg as she continues to walk.  Gait using foot up brace x 500 ft. With pt noted to have continued L hip internal rotation and slight L toeing in.  Foot up brace does improve L foot clearance slightly.  Discussed option of contacting orthotist for potential brace options-pt in agreement  TherEx Seated L ankle eversion x 10 reps, with addition of red theraband 2 sets x 10 reps.  L ankle dorsiflexion with red theraband 2 sets x 10 reps for ankle strengthening. Attempted stretch  of hip external rotators in sitting-pt unable to perform due to pain along lateral thigh.  Self Care:  Discussed role of potential IT band tightness in pain and stiffness with gait.  PT palpated along left IT band, with significant pain along distal aspect of left IT band, with nodules of increased tightness noted at areas of pain.  PT performs brief gentle massage along IT band and discussed use of ice massage and need to add manual therapy/massage to plan to address IT band pain/tightness.  Then would ideally progress to IT band stretching and have husband present during some sessions to allow for education on how he could assist with IT massage and ice/pain control at home.                      PT Education - 08/25/14 1503    Education provided Yes   Education Details tightness of IT band contributing to leg pain/  stiffness with walking; use of ice massage by husband along IT band; ankle strengthening   Person(s) Educated Patient   Methods Explanation;Handout   Comprehension Verbalized understanding             PT Long Term Goals - 08/15/14 1128    PT LONG TERM GOAL #1   Title Pt will be independent with HEP to address balance, stretching, strengthening.   Time 5   Period Weeks   Status New   PT LONG TERM GOAL #2   Title Pt will improve Functional Gait Assessment to at least 20/30 for decreased fall risk.   Time 5   Period Weeks   Status New   PT LONG TERM GOAL #3   Title Pt will report improvement on Neuro QOL scale by at least 10% for improved overall functional mobility.   Time 5   Period Weeks   Status New   PT LONG TERM GOAL #4   Title Pt will verbalize plans for continued community fitness upon D/C from PT.   Time 5   Period Weeks   Status New               Plan - 08/25/14 1508    Clinical Impression Statement Pt demonstrates significant tenderness to palpation as well tightness/trigger points along left IT band.  Pt does have slight Trendlenburg gait pattern and notes that when she has increased L foot dystonia that she feels additional torque on her L knee.  Will plan to address IT band tightness; will add manual therapy to plan   Pt will benefit from skilled therapeutic intervention in order to improve on the following deficits Abnormal gait;Decreased balance;Difficulty walking;Impaired tone;Impaired flexibility;Pain  tightness and trigger points in IT band on LLE   Rehab Potential Good   PT Frequency 2x / week   PT Duration 4 weeks   PT Treatment/Interventions ADLs/Self Care Home Management;Functional mobility training;Gait training;Balance training;Neuromuscular re-education;Therapeutic activities;Therapeutic exercise;Patient/family education;Manual techniques;Other (comment)  modalities-ice massage   PT Next Visit Plan IT band massage, ice massage (instructions  to husband), IT band stretching, stretching/strengthening L ankle   Consulted and Agree with Plan of Care Patient        Problem List Patient Active Problem List   Diagnosis Date Noted  . Multiple system atrophy, Parkinson variant 11/19/2013  . Thrush 06/15/2012  . S/P total knee arthroplasty 06/15/2012  . Fall 06/13/2012  . Urinary incontinence 06/13/2012  . Postop Acute blood loss anemia 06/04/2012  . Postop Hypokalemia 06/04/2012  . Pain due  to unicompartmental arthroplasty of knee 06/03/2012  . GERD 03/18/2008  . GI BLEED 03/18/2008  . DYSPHAGIA 03/18/2008  . FECAL OCCULT BLOOD 03/18/2008    Frazier Butt. 08/25/2014, 3:17 PM  [facility] [address] [place], [place], [address] Phone: [phone]   Fax:  [phone]

## 2014-08-26 ENCOUNTER — Ambulatory Visit: Payer: BLUE CROSS/BLUE SHIELD

## 2014-08-26 DIAGNOSIS — G249 Dystonia, unspecified: Secondary | ICD-10-CM

## 2014-08-26 DIAGNOSIS — R269 Unspecified abnormalities of gait and mobility: Secondary | ICD-10-CM | POA: Diagnosis not present

## 2014-08-26 NOTE — Therapy (Signed)
[facility] [address] [place], [place], [address] Phone: [phone]   Fax:  [phone]  Physical Therapy Treatment  Patient Details  Name: Rachel [name] MRN: [id number] Date of Birth: 03/16/1951 Referring Provider:  Marton Redwood, MD  Encounter Date: 08/26/2014      PT End of Session - 08/26/14 1317    Visit Number 3   Number of Visits 9   Date for PT Re-Evaluation 10/14/14   PT Start Time [id number]   PT Stop Time 1312   PT Time Calculation (min) 39 min      Past Medical History  Diagnosis Date  . Hypothyroidism   . Asthma   . Nephrolithiasis     hx of kidney stones, last 6 months ago  . Arthritis   . Blood clot in vein  age 64    left leg  . Anemia   . Multiple pregnancy loss, not currently pregnant     multiple miscarriages-pt.unsure of #  . GERD (gastroesophageal reflux disease)   . Osteopenia   . Hypertension     pt denies, in physician record  . Hyperlipidemia   . Tendinitis     left foot  . Multiple system atrophy 2015    Past Surgical History  Procedure Laterality Date  . Colonscopy  2011  . Joint replacement  05-2007    right knee replacment/revision  . Cystoscopy  2012  . Total knee revision  06/03/2012    Procedure: TOTAL KNEE REVISION;  Surgeon: Gearlean Alf, MD;  Location: [facility];  Service: Orthopedics;  Laterality: Left;  Revision of a Left Uni Knee to a Total Knee Arthroplasty  . Breast surgery      rt. lumpectomy, benign  . Replacement total knee Left 06/05/12  . Kidney stone surgery      There were no vitals taken for this visit.  Visit Diagnosis:  Abnormality of gait  Dystonia of foot      Subjective Assessment - 08/26/14 1236    Symptoms No falls or medical changes since last visit.   Currently in Pain? Yes   Pain Score 3   reports is is managed by chiropractor   Pain Location Back        Therex: see HEP provided to address IT band pain. Performed these as  indicated in session today. Also performed isometric ankle inversion/eversion in standing/weight bearing  Neuro re-ed: Anterior posterior limits of stability on solid surface and on airex foam without UE support, then performed on airex with intermittent CGA and intermittent UE support. Then performed with eyes closed on foam with MIN A and intermittent UE support. Then stood on foam with feet apart then together with 10x horizontal, 10x vertical and 10x each diagonal head turns.  Lateral walking on foam balance beam with intermittent UE support 3 laps at counter.  Tandem walking on foam balance beam with intermittent UE support 3 laps at counter                       PT Education - 08/26/14 1316    Education provided Yes   Education Details HEP to address IT band tightness   Person(s) Educated Patient   Methods Explanation;Demonstration;Handout   Comprehension Verbalized understanding;Returned demonstration             PT Long Term Goals - 08/15/14 1128    PT LONG TERM GOAL #1   Title Pt will be independent with HEP  to address balance, stretching, strengthening.   Time 5   Period Weeks   Status New   PT LONG TERM GOAL #2   Title Pt will improve Functional Gait Assessment to at least 20/30 for decreased fall risk.   Time 5   Period Weeks   Status New   PT LONG TERM GOAL #3   Title Pt will report improvement on Neuro QOL scale by at least 10% for improved overall functional mobility.   Time 5   Period Weeks   Status New   PT LONG TERM GOAL #4   Title Pt will verbalize plans for continued community fitness upon D/C from PT.   Time 5   Period Weeks   Status New               Plan - 08/26/14 1318    Clinical Impression Statement Pt was taught and provided with HEP for IT band tightness and glut medius strengthening to address trendelenburg gait pattern. Pt performed well with compliant surface balance training.   PT Next Visit Plan Ask about IT  band HEP, review if needed. Continue with balance training.        Problem List Patient Active Problem List   Diagnosis Date Noted  . Multiple system atrophy, Parkinson variant 11/19/2013  . Thrush 06/15/2012  . S/P total knee arthroplasty 06/15/2012  . Fall 06/13/2012  . Urinary incontinence 06/13/2012  . Postop Acute blood loss anemia 06/04/2012  . Postop Hypokalemia 06/04/2012  . Pain due to unicompartmental arthroplasty of knee 06/03/2012  . GERD 03/18/2008  . GI BLEED 03/18/2008  . DYSPHAGIA 03/18/2008  . FECAL OCCULT BLOOD 03/18/2008    Delrae Sawyers D 08/26/2014, 11:11 PM  [facility] [address] [place], [place], [address] Phone: [phone]   Fax:  [phone]

## 2014-08-26 NOTE — Patient Instructions (Signed)
Clam Shell 45 Degrees   Lying on your side with a 1/4th forward roll with hips and knees bent 45. Lift knee 3". Be sure pelvis does not roll backward. Do not arch back. Do 3 sets of 10 reps, each leg, every other day.  http://ss.exer.us/75   Copyright  VHI. All rights reserved.    IT Band: Wall Lean With Crossed Leg   Stand with left hand on wall (or hold onto a chair). Cross left leg behind other leg. Stretch hip toward wall with other arm supporting trunk. Hold 30 seconds. Relax. Repeat 3 times each leg. Do 2 sets per  Day. (morning and night) Repeat on other side.  Copyright  VHI. All rights reserved.   Hip Flexor Stretch   Lying on back near edge of bed, bend one leg, foot flat. Hang other leg over edge, relaxed, bend knee until a gentle stretch is felt. Keep foot supported with a stack of books or the floor. Hold 30 seconds.  Repeat 3 times on each leg. Do 2 sessions per day (morning and night). Advanced Exercise: Bend knee back keeping thigh in contact with bed.  http://gt2.exer.us/347   Copyright  VHI. All rights reserved.  Gluteal Stretch--don't use a ball    Lie supine, foot flat on bed and, other ankle crossed over knee. Push your knee away from your body until a gentle stretch is felt. Peform 3x30 seconds on each leg morning and night. Copyright  VHI. All rights reserved.   

## 2014-08-29 ENCOUNTER — Ambulatory Visit: Payer: BLUE CROSS/BLUE SHIELD | Admitting: Physical Therapy

## 2014-08-31 ENCOUNTER — Telehealth: Payer: Self-pay | Admitting: Neurology

## 2014-08-31 NOTE — Telephone Encounter (Signed)
Not directly.  But she does have an unusual gait, in part because of MSA and that may contribute to some back pain issues.  I would suggest working with PT first.  Does she wish to start there?

## 2014-08-31 NOTE — Telephone Encounter (Signed)
Pt called wanting to speak to a nurse regarding her current condition. Something to do with her back. Please call pt # [phone]

## 2014-08-31 NOTE — Telephone Encounter (Signed)
Spoke with patient. She states she has had some low back pain that started approx 2 weeks ago. It started with certain movements, then became a constant ache. This morning she started having a pain that "grabbed" her. She wants to know if this could be a side effect of the MSA. Please advise.

## 2014-09-01 ENCOUNTER — Ambulatory Visit: Payer: BLUE CROSS/BLUE SHIELD | Admitting: Physical Therapy

## 2014-09-01 NOTE — Telephone Encounter (Signed)
Spoke with patient and made her aware of recommendation for PT. She states she is currently undergoing PT - she had cancelled it this week due to the pain in her back but will call to proceed with PT next week. She will call with any questions.

## 2014-09-05 ENCOUNTER — Ambulatory Visit: Payer: BLUE CROSS/BLUE SHIELD | Admitting: Physical Therapy

## 2014-09-07 ENCOUNTER — Ambulatory Visit: Payer: BLUE CROSS/BLUE SHIELD | Admitting: Physical Therapy

## 2014-09-08 ENCOUNTER — Other Ambulatory Visit: Payer: Self-pay | Admitting: Internal Medicine

## 2014-09-08 DIAGNOSIS — M545 Low back pain, unspecified: Secondary | ICD-10-CM

## 2014-09-12 ENCOUNTER — Ambulatory Visit: Payer: BLUE CROSS/BLUE SHIELD | Admitting: Physical Therapy

## 2014-09-13 ENCOUNTER — Ambulatory Visit
Admission: RE | Admit: 2014-09-13 | Discharge: 2014-09-13 | Disposition: A | Discharge status: Home / Self Care | Payer: BLUE CROSS/BLUE SHIELD | Source: Ambulatory Visit | Attending: Internal Medicine | Admitting: Internal Medicine

## 2014-09-13 DIAGNOSIS — M545 Low back pain, unspecified: Secondary | ICD-10-CM

## 2014-09-14 ENCOUNTER — Ambulatory Visit: Payer: BLUE CROSS/BLUE SHIELD | Admitting: Physical Therapy

## 2014-09-20 ENCOUNTER — Ambulatory Visit: Payer: BC Managed Care – PPO | Admitting: Occupational Therapy

## 2014-09-20 ENCOUNTER — Ambulatory Visit: Payer: BLUE CROSS/BLUE SHIELD | Admitting: Speech Pathology

## 2014-09-20 ENCOUNTER — Ambulatory Visit: Payer: BC Managed Care – PPO | Admitting: Physical Therapy

## 2014-09-22 ENCOUNTER — Other Ambulatory Visit: Payer: Self-pay | Admitting: Orthopedic Surgery

## 2014-09-22 DIAGNOSIS — M8580 Other specified disorders of bone density and structure, unspecified site: Secondary | ICD-10-CM

## 2014-09-26 ENCOUNTER — Ambulatory Visit
Admission: RE | Admit: 2014-09-26 | Discharge: 2014-09-26 | Disposition: A | Discharge status: Home / Self Care | Payer: BLUE CROSS/BLUE SHIELD | Source: Ambulatory Visit | Attending: Orthopedic Surgery | Admitting: Orthopedic Surgery

## 2014-09-26 DIAGNOSIS — M8580 Other specified disorders of bone density and structure, unspecified site: Secondary | ICD-10-CM

## 2014-11-03 ENCOUNTER — Encounter: Payer: Self-pay | Admitting: Neurology

## 2014-11-11 ENCOUNTER — Ambulatory Visit: Payer: BC Managed Care – PPO | Admitting: Neurology

## 2014-12-02 ENCOUNTER — Ambulatory Visit (INDEPENDENT_AMBULATORY_CARE_PROVIDER_SITE_OTHER): Payer: BLUE CROSS/BLUE SHIELD | Admitting: Neurology

## 2014-12-02 ENCOUNTER — Encounter: Payer: Self-pay | Admitting: Neurology

## 2014-12-02 VITALS — BP 102/66 | HR 68 | Ht 65.0 in | Wt 212.0 lb

## 2014-12-02 DIAGNOSIS — G239 Degenerative disease of basal ganglia, unspecified: Secondary | ICD-10-CM

## 2014-12-02 DIAGNOSIS — H532 Diplopia: Secondary | ICD-10-CM

## 2014-12-02 DIAGNOSIS — G903 Multi-system degeneration of the autonomic nervous system: Secondary | ICD-10-CM

## 2014-12-02 NOTE — Progress Notes (Signed)
Subjective:   Rachel [name] was seen in consultation in the movement disorder clinic at the request of Marton Redwood, MD.  The evaluation is for tremor.  The patient is a 64 y.o. right handed female with a history of tremor.  The pt reports that she first noted shaking in the legs and arms bilaterally about 1 - 1.5 years ago.  It was intermittent.  She now notices tremor of the hands, legs and head.  She has better and worse days.  She notes that lying in the recliner helps.  She notes that walking and exercise makes it worse.  The L leg feels that it will stiffen up and cause the tremor.  There is a family hx of tremor in her mother but it was undiagnosed.    Affected by caffeine:  yes (quit drinking caffeine all together) Exacerbated by albuterol:  Yes Affected by alcohol:  unknown Affected by stress:  yes Affected by fatigue:  yes Spills soup if on spoon:  yes Spills glass of liquid if full:  yes Affects ADL's (tying shoes, brushing teeth, etc):  no Affects ability to put on makeup:  Yes  09/1813 update:  The patient is following up today regarding tremor.  She is accompanied by her spouse, who supplements the history.  I have not seen the patient since her first appointment in August, 2014.  At that point in time, she did not want any medication for the tremor.  She did have an MRI of the brain in August, 2014 that was normal.  I did review this.  Over the course of time, she has noted tremor in the L foot and hand.  She states that if she uses the hands, her L leg will stiffen and will eventually hurt.  Minimal neck pain.  Has back pain but thinks that is independent.  Her bladder/bowel work okay.  Tremor is much better in the hands, L more than R, but it is better now that she is taking herbs that her chiropractor gave her.  She is receiving kinesiology treatment.  She feels that her walking is "awkward" because of the foot.  She has trouble with arm swing.  She reports some shakiness with  handwriting (R hand dominant) but no micrographia.  She is sleeping well.  She has no vivid dreams.  No sleep walking/talking.  No diplopia.  Does have hx of scintillating scotomas that is sometimes associated with migraine.  Denies swallowing problems.  No speech changes per pt but husband states that sometimes pt inserts one word for another or has word finding trouble.  Has "no smell" but able to taste, but thinks that there is some loss because of not being able to taste food.  She admits that she has been researching various causes of her symptoms, including Parkinson's and dystonias.  She requests a DaT scan.  11/19/13 update:  The patient is following up today, accompanied by her husband who helps to supplement the history.  Since our last visit, the patient did undergo a DaT scan.  No films are available.  However, I did get the report.  There was asymmetric uptake, with no uptake in the right putamen and normal on the left.  Pt opted to hold on the MRIs ordered last visit.  We did start carbidopa/levodopa last visit and the patient states that it is about 30% beneficial for her symptoms.   03/21/14 update:  This patient is accompanied in the office by her spouse who supplements  the history.  Last visit, I increased the patients carbidopa/levodopa 25/100 to qid but she hasn't really need to do that.  She is overall doing much better.  I did send her for PT/OT and that has helped.   She has had some constipation and been on miralax for that.  She states that she had severe "brain fog" yesterday and thinks that it is related to the levodopa.  She was driving and was confused about where she was.  She has had some other minor episodes prior to that of feeling confused but nothing like that.  She states that her chiropracter gave her "brain medicine" (2 of them, one she calls fito-brain B12) and she states that medicine allowed her arm to swing. She sees her chiropractor daily now and is very pleased with her  services.  The levodopa did help so her leg no longer tremors.   She does think that she has had her thyroid checked this year and states that her synthroid was increased last visit but cannot remember when that was.    07/11/14 update:  The patient is following up today, accompanied by her husband who supplements the history.  The patient is currently on carbidopa/levodopa 25/100, 4 times per day (went back up from 3 times per day because of "jerking" and foot dystonia).  I reviewed records since her last visit.  She was in her wife for vacation in October when she called me.  She stated that she initially had a scratchy throat and then suddenly she was not able to taste.  This sounded viral to me, but I told her that nonetheless it was not related to [place], as symptoms do not come on that acutely.  I told her that she could follow up with a physician in [place].   She states that it took 6-8 weeks to go away, but her husband got the same thing.   She is not falling currently but thinks that her balance is really not good.  She cannot remember the last PT date.  She still has trouble with her memory.  That is her biggest c/o.  She is having word finding trouble.    12/02/14 update:  The patient is f/u today.  She is on carbidopa/levodopa 25/100 four times a day.  Her sister in law died a few weeks ago after complications from a fall.  This was unexpected.  She was only in her 77's.  She went for neuropsych testing since our last visit.  No evidence of dementia.  Evidence of adjustment disorder with mixed anxiety/depression and felt that she may benefit from brief trial of psychotherapy.  Pt states that she is doing well in that regard and does not think she needs the therapy right now and she did start some physical therapy since last visit, but she states that she did not end up completing it because something else came up, but feels that she has been doing well.  Her left foot only turns and when she is very  tired.  She has diplopia when looking left.  She has an appt with neuro-opth (Dr. Hassell Done) at the end of may.     Current/Previously tried tremor medications: tried propranolol on one occasion but seemed to make her sick and she did not want to take the next pill  Current medications that may exacerbate tremor:  proair  Outside reports reviewed: historical medical records.  Allergies  Allergen Reactions  . Symbicort [Budesonide-Formoterol Fumarate] Other (See  Comments)    Shakes and muscle weakness    Current Outpatient Prescriptions on File Prior to Visit  Medication Sig Dispense Refill  . CALCIUM LACTATE PO Take 6 tablets by mouth daily.    . carbidopa-levodopa (SINEMET IR) 25-100 MG per tablet Take 1 tablet by mouth 4 (four) times daily. 120 tablet 5  . Cholecalciferol (VITAMIN D-3 PO) Take 3 tablets by mouth daily.    . citalopram (CELEXA) 20 MG tablet Take 20 mg by mouth daily.     Marland Kitchen levothyroxine (SYNTHROID, LEVOTHROID) 88 MCG tablet Take 88 mcg by mouth at bedtime.    . Multiple Vitamin (MULTIVITAMIN WITH MINERALS) TABS tablet Take 2 tablets by mouth daily.    Marland Kitchen omeprazole (PRILOSEC OTC) 20 MG tablet Take 20 mg by mouth daily before breakfast.    . OVER THE COUNTER MEDICATION Take 1 tablet by mouth daily. Neurotrophine - 3 tablets daily Phyto Brain EK5L - 1 teaspoon daily Nitric Balance - 1 teaspoon daily    . Probiotic Product (PROBIOTIC DAILY PO) Take 1 tablet by mouth daily.    Marland Kitchen QUERCETIN PO Take 4 tablets by mouth daily.    . VOLTAREN 1 % GEL      No current facility-administered medications on file prior to visit.    Past Medical History  Diagnosis Date  . Hypothyroidism   . Asthma   . Nephrolithiasis     hx of kidney stones, last 6 months ago  . Arthritis   . Blood clot in vein  age 42    left leg  . Anemia   . Multiple pregnancy loss, not currently pregnant     multiple miscarriages-pt.unsure of #  . GERD (gastroesophageal reflux disease)   . Osteopenia    . Hypertension     pt denies, in physician record  . Hyperlipidemia   . Tendinitis     left foot  . Multiple system atrophy 2015    Past Surgical History  Procedure Laterality Date  . Colonscopy  2011  . Joint replacement  05-2007    right knee replacment/revision  . Cystoscopy  2012  . Total knee revision  06/03/2012    Procedure: TOTAL KNEE REVISION;  Surgeon: Gearlean Alf, MD;  Location: [facility];  Service: Orthopedics;  Laterality: Left;  Revision of a Left Uni Knee to a Total Knee Arthroplasty  . Breast surgery      rt. lumpectomy, benign  . Replacement total knee Left 06/05/12  . Kidney stone surgery      History   Social History  . Marital Status: Married    Spouse Name: N/A  . Number of Children: N/A  . Years of Education: N/A   Occupational History  . retired     Research scientist (physical sciences)   Social History Main Topics  . Smoking status: Never Smoker   . Smokeless tobacco: Never Used  . Alcohol Use: No  . Drug Use: No  . Sexual Activity:    Partners: Male    Birth Control/ Protection: Post-menopausal   Other Topics Concern  . Not on file   Social History Narrative    Family Status  Relation Status Death Age  . Mother Deceased     fall, death during surgery  . Father Deceased     ? heart  . Brother Alive     healthy  . Sister Alive     2, asthma, heart problem  . Child Alive     adopted, healthy  Review of Systems A complete 10 system ROS was obtained and was negative apart from what is mentioned.   Objective:   VITALS:   Filed Vitals:   12/02/14 1427  BP: 102/66  Pulse: 68  Height: 5\' 5"  (1.651 m)  Weight: 212 lb (96.163 kg)   Wt Readings from Last 3 Encounters:  12/02/14 212 lb (96.163 kg)  09/13/14 209 lb (94.802 kg)  07/11/14 214 lb (97.07 kg)     Gen:  Appears stated age and in NAD. HEENT:  Normocephalic, atraumatic. The mucous membranes are moist. The superficial temporal arteries are without ropiness or  tenderness. Cardiovascular: Regular rate and rhythm. Lungs: Clear to auscultation bilaterally. Neck: There are no carotid bruits noted bilaterally.  NEUROLOGICAL:  Orientation:  The patient is alert and oriented x 3.  Recent and remote memory are intact.  Attention span and concentration are normal.  Able to name objects and repeat without trouble.  Fund of knowledge is appropriate Cranial nerves: There is good facial symmetry.  Extraocular muscles are intact and visual fields are full to confrontational testing. Speech is fluent and clear. Soft palate rises symmetrically and there is no tongue deviation. Hearing is intact to conversational tone. Tone: Tone is good throughout.  There is no spasticity or rigidity. Coordination:  The patient has good rapid alternating movements today Motor: Strength is 5/5 in the bilateral upper and lower extremities.  Shoulder shrug is equal bilaterally.  There is no pronator drift.  There are no fasciculations noted. Gait and Station: The patient is able to ambulate and has good arm swing today.  MOVEMENT EXAM: Tremor:   No tremor at all today.  No significant foot dystonia today seen  Labs:  No results found for: TSH      Assessment/Plan:   1.  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  She and I talked about the diagnosis.  She went back to carbidopa/levodopa 25/100 qid.  I encouraged her to continue to exercise.    -Wanted to hold on physical therapy.  She really looked quite good today  2.  Cognitive impairment  -As above, she had neuropsych testing done in January, 2016 and there was no evidence of dementia.  There was some evidence of mixed anxiety/depression and a brief trial of psychotherapy was recommended, but the patient felt that she really did not need that.  It was recommended that we review her supplements, as she reported to them that she took 13 different supplements, and I reviewed her supplements today.  She has backed down  on the number she is taking.  3.  Diplopia  -likely related to [place].  Has appt at [facility] with Dr. Hassell Done in neuro-ophth.     4.  Return in about 4 months (around 04/04/2015).

## 2015-01-27 ENCOUNTER — Encounter (INDEPENDENT_AMBULATORY_CARE_PROVIDER_SITE_OTHER): Payer: BLUE CROSS/BLUE SHIELD | Admitting: Certified Nurse Midwife

## 2015-01-27 NOTE — Progress Notes (Deleted)
64 y.o. No obstetric history on file. Married  Caucasian Fe here for annual exam.    No LMP recorded. Patient is postmenopausal.          Sexually active: {yes no:314532}  The current method of family planning is {contraception:315051}.    Exercising: {yes no:314532}  {types:19826} Smoker:  {YES NO:22349}  Health Maintenance: Pap: 01-20-14 neg MMG:  ** Colonoscopy:  2010 neg f/u 68yrs BMD:   5/15 TDaP:  2014 Labs: ***   reports that she has never smoked. She has never used smokeless tobacco. She reports that she does not drink alcohol or use illicit drugs.  Past Medical History  Diagnosis Date  . Hypothyroidism   . Asthma   . Nephrolithiasis     hx of kidney stones, last 6 months ago  . Arthritis   . Blood clot in vein  age 14    left leg  . Anemia   . Multiple pregnancy loss, not currently pregnant     multiple miscarriages-pt.unsure of #  . GERD (gastroesophageal reflux disease)   . Osteopenia   . Hypertension     pt denies, in physician record  . Hyperlipidemia   . Tendinitis     left foot  . Multiple system atrophy 2015    Past Surgical History  Procedure Laterality Date  . Colonscopy  2011  . Joint replacement  05-2007    right knee replacment/revision  . Cystoscopy  2012  . Total knee revision  06/03/2012    Procedure: TOTAL KNEE REVISION;  Surgeon: Gearlean Alf, MD;  Location: [facility];  Service: Orthopedics;  Laterality: Left;  Revision of a Left Uni Knee to a Total Knee Arthroplasty  . Breast surgery      rt. lumpectomy, benign  . Replacement total knee Left 06/05/12  . Kidney stone surgery      Current Outpatient Prescriptions  Medication Sig Dispense Refill  . CALCIUM LACTATE PO Take 6 tablets by mouth daily.    . carbidopa-levodopa (SINEMET IR) 25-100 MG per tablet Take 1 tablet by mouth 4 (four) times daily. 120 tablet 5  . Cholecalciferol (VITAMIN D-3 PO) Take 3 tablets by mouth daily.    . citalopram (CELEXA) 20 MG tablet Take 20 mg by mouth  daily.     Marland Kitchen levothyroxine (SYNTHROID, LEVOTHROID) 88 MCG tablet Take 88 mcg by mouth at bedtime.    . Multiple Vitamin (MULTIVITAMIN WITH MINERALS) TABS tablet Take 2 tablets by mouth daily.    Marland Kitchen omeprazole (PRILOSEC OTC) 20 MG tablet Take 20 mg by mouth daily before breakfast.    . OVER THE COUNTER MEDICATION Take 1 tablet by mouth daily. Neurotrophine - 3 tablets daily Phyto Brain EK5L - 1 teaspoon daily Nitric Balance - 1 teaspoon daily    . Probiotic Product (PROBIOTIC DAILY PO) Take 1 tablet by mouth daily.    Marland Kitchen QUERCETIN PO Take 4 tablets by mouth daily.    . VOLTAREN 1 % GEL      No current facility-administered medications for this visit.    Family History  Problem Relation Age of Onset  . Diabetes Mother   . Heart failure Mother     CHF  . Thyroid disease Mother   . Heart failure Father     CHF  . Breast cancer Sister     ROS:  Pertinent items are noted in HPI.  Otherwise, a comprehensive ROS was negative.  Exam:   There were no vitals taken for this  visit.   Ht Readings from Last 3 Encounters:  12/02/14 5\' 5"  (1.651 m)  07/11/14 5\' 6"  (1.676 m)  06/06/14 5' 4.25" (1.632 m)    General appearance: alert, cooperative and appears stated age Head: Normocephalic, without obvious abnormality, atraumatic Neck: no adenopathy, supple, symmetrical, trachea midline and thyroid {EXAM; THYROID:18604} Lungs: clear to auscultation bilaterally Breasts: {Exam; breast:13139::"normal appearance, no masses or tenderness"} Heart: regular rate and rhythm Abdomen: soft, non-tender; no masses,  no organomegaly Extremities: extremities normal, atraumatic, no cyanosis or edema Skin: Skin color, texture, turgor normal. No rashes or lesions Lymph nodes: Cervical, supraclavicular, and axillary nodes normal. No abnormal inguinal nodes palpated Neurologic: Grossly normal   Pelvic: External genitalia:  no lesions              Urethra:  normal appearing urethra with no masses, tenderness  or lesions              Bartholin's and Skene's: normal                 Vagina: normal appearing vagina with normal color and discharge, no lesions              Cervix: {exam; cervix:14595}              Pap taken: {yes no:314532} Bimanual Exam:  Uterus:  {exam; uterus:12215}              Adnexa: {exam; adnexa:12223}               Rectovaginal: Confirms               Anus:  normal sphincter tone, no lesions  Chaperone present: Yes/NO  A:  Well Woman with normal exam  P:   Reviewed health and wellness pertinent to exam  Pap smear as above  {plan; gyn:5269::"mammogram","pap smear","return annually or prn"}  An After Visit Summary was printed and given to the patient.    This encounter was created in error - please disregard.

## 2015-01-31 ENCOUNTER — Telehealth: Payer: Self-pay | Admitting: Certified Nurse Midwife

## 2015-01-31 NOTE — Telephone Encounter (Signed)
Left message to call back about scheduling annual appointment [facility] MEDICARE AUTHORIZATION# [id number]  CPT [id number]/ [phone]

## 2015-02-12 NOTE — Progress Notes (Signed)
Erroneous encounter

## 2015-02-16 ENCOUNTER — Ambulatory Visit (INDEPENDENT_AMBULATORY_CARE_PROVIDER_SITE_OTHER): Payer: Medicare Other | Admitting: Certified Nurse Midwife

## 2015-02-16 ENCOUNTER — Encounter: Payer: Self-pay | Admitting: Certified Nurse Midwife

## 2015-02-16 VITALS — BP 122/78 | HR 70 | Resp 16 | Ht 64.75 in | Wt 209.0 lb

## 2015-02-16 DIAGNOSIS — Z01419 Encounter for gynecological examination (general) (routine) without abnormal findings: Secondary | ICD-10-CM | POA: Diagnosis not present

## 2015-02-16 DIAGNOSIS — N952 Postmenopausal atrophic vaginitis: Secondary | ICD-10-CM | POA: Diagnosis not present

## 2015-02-16 DIAGNOSIS — N63 Unspecified lump in breast: Secondary | ICD-10-CM

## 2015-02-16 DIAGNOSIS — N6320 Unspecified lump in the left breast, unspecified quadrant: Secondary | ICD-10-CM

## 2015-02-16 DIAGNOSIS — Z124 Encounter for screening for malignant neoplasm of cervix: Secondary | ICD-10-CM | POA: Diagnosis not present

## 2015-02-16 NOTE — Progress Notes (Signed)
64 y.o. many spontaneous abortions(per patient) Married  Caucasian Fe here for annual exam.Menopausal no vaginal bleeding or vaginal dryness issues now. Sees Dr. Brigitte Pulse PCP once yearly and for aex/labs and medication management of hypothyroid and anxiety. Dr.Tat (neuro) is managing MSA medication every 4 months, stable at present. Occasional stress incontinence, no problems( no daily pads). New job with [employer]!! No other health issues today.  No LMP recorded. Patient is postmenopausal.          Sexually active: No.  The current method of family planning is post menopausal status.    Exercising: Yes.    walking Smoker:  no  Health Maintenance: Pap: 01-20-14 neg MMG: 12-29-13 category b density, birads 1:neg Colonoscopy: 2010 neg f/u 32yrs BMD:   5/15 TDaP:  2014 Labs: pcp Self breast exam: done occ   reports that she has never smoked. She has never used smokeless tobacco. She reports that she does not drink alcohol or use illicit drugs.  Past Medical History  Diagnosis Date  . Hypothyroidism   . Asthma   . Nephrolithiasis     hx of kidney stones, last 6 months ago  . Arthritis   . Blood clot in vein  age 5    left leg  . Anemia   . Multiple pregnancy loss, not currently pregnant     multiple miscarriages-pt.unsure of #  . GERD (gastroesophageal reflux disease)   . Osteopenia   . Hypertension     pt denies, in physician record  . Hyperlipidemia   . Tendinitis     left foot  . Multiple system atrophy 2015    Past Surgical History  Procedure Laterality Date  . Colonscopy  2011  . Joint replacement  05-2007    right knee replacment/revision  . Cystoscopy  2012  . Total knee revision  06/03/2012    Procedure: TOTAL KNEE REVISION;  Surgeon: Gearlean Alf, MD;  Location: [facility];  Service: Orthopedics;  Laterality: Left;  Revision of a Left Uni Knee to a Total Knee Arthroplasty  . Breast surgery      rt. lumpectomy, benign  . Replacement total knee Left 06/05/12  . Kidney  stone surgery      Current Outpatient Prescriptions  Medication Sig Dispense Refill  . CALCIUM LACTATE PO Take 6 tablets by mouth daily.    . carbidopa-levodopa (SINEMET IR) 25-100 MG per tablet Take 1 tablet by mouth 4 (four) times daily. 120 tablet 5  . Cholecalciferol (VITAMIN D-3 PO) Take 3 tablets by mouth daily.    . citalopram (CELEXA) 20 MG tablet Take 20 mg by mouth daily.     Marland Kitchen levothyroxine (SYNTHROID, LEVOTHROID) 88 MCG tablet Take 88 mcg by mouth at bedtime.    . Multiple Vitamin (MULTIVITAMIN WITH MINERALS) TABS tablet Take 2 tablets by mouth daily.    Marland Kitchen omeprazole (PRILOSEC OTC) 20 MG tablet Take 20 mg by mouth daily before breakfast.    . OVER THE COUNTER MEDICATION Take 1 tablet by mouth daily. Neurotrophine - 3 tablets daily Phyto Brain EK5L - 1 teaspoon daily Nitric Balance - 1 teaspoon daily    . Probiotic Product (PROBIOTIC DAILY PO) Take 1 tablet by mouth daily.    Marland Kitchen QUERCETIN PO Take 4 tablets by mouth daily.    . VOLTAREN 1 % GEL      No current facility-administered medications for this visit.    Family History  Problem Relation Age of Onset  . Diabetes Mother   .  Heart failure Mother     CHF  . Thyroid disease Mother   . Heart failure Father     CHF  . Breast cancer Sister     ROS:  Pertinent items are noted in HPI.  Otherwise, a comprehensive ROS was negative.  Exam:   BP 122/78 mmHg  Pulse 70  Resp 16  Ht 5' 4.75" (1.645 m)  Wt 209 lb (94.802 kg)  BMI 35.03 kg/m2 Height: 5' 4.75" (164.5 cm) Ht Readings from Last 3 Encounters:  02/16/15 5' 4.75" (1.645 m)  12/02/14 5\' 5"  (1.651 m)  07/11/14 5\' 6"  (1.676 m)    General appearance: alert, cooperative and appears stated age Head: Normocephalic, without obvious abnormality, atraumatic Neck: no adenopathy, supple, symmetrical, trachea midline and thyroid normal to inspection and palpation Lungs: clear to auscultation bilaterally Breasts: normal appearance, no masses or tenderness, No nipple  retraction or dimpling, No nipple discharge or bleeding, No axillary or supraclavicular adenopathy, positive findings: left breast at 5 o'clock 2 FB from areola firm mass noted, slightly mobile,  non tender Heart: regular rate and rhythm Abdomen: soft, non-tender; no masses,  no organomegaly Extremities: extremities normal, atraumatic, no cyanosis or edema Skin: Skin color, texture, turgor normal. No rashes or lesions Lymph nodes: Cervical, supraclavicular, and axillary nodes normal. No abnormal inguinal nodes palpated Neurologic: Grossly normal   Pelvic: External genitalia:  no lesions              Urethra:  normal appearing urethra with no masses, tenderness or lesions              Bartholin's and Skene's: normal                 Vagina: normal appearing vagina with normal color and discharge, no lesions              Cervix: normal              Pap taken: No. Bimanual Exam:  Uterus:  normal size, contour, position, consistency, mobility, non-tender and mid position              Adnexa: normal adnexa and no mass, fullness, tenderness               Rectovaginal: Confirms               Anus:  normal sphincter tone, no lesions  Chaperone present: Yes  A:  Well Woman with normal exam  Menopausal no HRT  Atrophic vaginitis  Left breast mass  Hypothyroid and anxiety with PCP management  MSA with neurology management    P:   Reviewed health and wellness pertinent to exam  Aware of need to evaluate if vaginal bleeding  Discussed OTC olive oil or coconut oil use for exterior dryness, will not use estrogen. Will advise if problems  Discussed finding and need to evaluate with diagnostic mammogram and [place]. Patient agreeable, will schedule prior to leaving.  Follow with MD as indicated.  Pap smear not taken   counseled on breast self exam, mammography screening, adequate intake of calcium and vitamin D, diet and exercise, Kegel's exercises  return annually or prn  An After Visit Summary  was printed and given to the patient.

## 2015-02-16 NOTE — Progress Notes (Signed)
Scheduled patient will in office for bilateral diagnostic mammogram with left breast ultrasound at [facility] for 02/21/2015 at 7:40am. Patient is agreeable to date and time.

## 2015-02-16 NOTE — Patient Instructions (Signed)

## 2015-02-16 NOTE — Progress Notes (Signed)
Reviewed personally.  M. Suzanne Kassaundra Hair, MD.  

## 2015-02-21 ENCOUNTER — Ambulatory Visit
Admission: RE | Admit: 2015-02-21 | Discharge: 2015-02-21 | Disposition: A | Discharge status: Home / Self Care | Payer: Medicare Other | Source: Ambulatory Visit | Attending: Certified Nurse Midwife | Admitting: Certified Nurse Midwife

## 2015-02-21 DIAGNOSIS — N6320 Unspecified lump in the left breast, unspecified quadrant: Secondary | ICD-10-CM

## 2015-02-23 ENCOUNTER — Telehealth: Payer: Self-pay | Admitting: Emergency Medicine

## 2015-02-23 NOTE — Telephone Encounter (Signed)
-----   Message from Regina Eck, CNM sent at 02/21/2015 12:16 PM EDT ----- Normal mammogram with no mass noted, US showed  Ridge of fibroglandular tissue at area of concern Birads negative Repeat one year. Would like to recheck area in 2 months.

## 2015-02-23 NOTE — Telephone Encounter (Signed)
Called patient, she cannot talk. She asked me to call back in the morning.

## 2015-03-01 NOTE — Telephone Encounter (Signed)
Out of hold per Dr. Sabra Heck.  Will close encounter.

## 2015-03-01 NOTE — Telephone Encounter (Addendum)
Call to patient. She is given message from [facility]. Patient declines to schedule breast check. She states "I'd rather not. I think everything is just fine."  Advised patient would let provider know her preferences and advised patient to return call with any concerns. She is agreeable to this.   Routing to Dr. Sabra Heck and Regina Eck CNM. Patient currently in mammogram hold.

## 2015-03-01 NOTE — Telephone Encounter (Signed)
Out of MMG hold.  Pt is aware of recommendations.

## 2015-03-01 NOTE — Telephone Encounter (Signed)
Pt is aware of recommendations.  Out of MMG hold.

## 2015-04-05 ENCOUNTER — Encounter: Payer: Self-pay | Admitting: Neurology

## 2015-04-13 ENCOUNTER — Ambulatory Visit: Payer: BLUE CROSS/BLUE SHIELD | Admitting: Neurology

## 2015-04-13 ENCOUNTER — Ambulatory Visit (INDEPENDENT_AMBULATORY_CARE_PROVIDER_SITE_OTHER): Payer: Medicare Other | Admitting: Neurology

## 2015-04-13 ENCOUNTER — Encounter: Payer: Self-pay | Admitting: Neurology

## 2015-04-13 VITALS — BP 136/70 | HR 72 | Ht 66.0 in | Wt 213.0 lb

## 2015-04-13 DIAGNOSIS — H532 Diplopia: Secondary | ICD-10-CM | POA: Diagnosis not present

## 2015-04-13 DIAGNOSIS — G903 Multi-system degeneration of the autonomic nervous system: Secondary | ICD-10-CM

## 2015-04-13 DIAGNOSIS — G239 Degenerative disease of basal ganglia, unspecified: Secondary | ICD-10-CM | POA: Diagnosis not present

## 2015-04-13 MED ORDER — CARBIDOPA-LEVODOPA 25-100 MG PO TABS: 1.0000 | ORAL_TABLET | Freq: Four times a day (QID) | ORAL | Status: DC

## 2015-04-13 NOTE — Addendum Note (Signed)
Addended byAnnamaria Helling on: 04/13/2015 09:10 AM   Modules accepted: Orders

## 2015-04-13 NOTE — Progress Notes (Signed)
Subjective:   Rachel [name] was seen in consultation in the movement disorder clinic at the request of Marton Redwood, MD.  The evaluation is for tremor.  The patient is a 64 y.o. right handed female with a history of tremor.  The pt reports that she first noted shaking in the legs and arms bilaterally about 1 - 1.5 years ago.  It was intermittent.  She now notices tremor of the hands, legs and head.  She has better and worse days.  She notes that lying in the recliner helps.  She notes that walking and exercise makes it worse.  The L leg feels that it will stiffen up and cause the tremor.  There is a family hx of tremor in her mother but it was undiagnosed.    Affected by caffeine:  yes (quit drinking caffeine all together) Exacerbated by albuterol:  Yes Affected by alcohol:  unknown Affected by stress:  yes Affected by fatigue:  yes Spills soup if on spoon:  yes Spills glass of liquid if full:  yes Affects ADL's (tying shoes, brushing teeth, etc):  no Affects ability to put on makeup:  Yes  09/1813 update:  The patient is following up today regarding tremor.  She is accompanied by her spouse, who supplements the history.  I have not seen the patient since her first appointment in August, 2014.  At that point in time, she did not want any medication for the tremor.  She did have an MRI of the brain in August, 2014 that was normal.  I did review this.  Over the course of time, she has noted tremor in the L foot and hand.  She states that if she uses the hands, her L leg will stiffen and will eventually hurt.  Minimal neck pain.  Has back pain but thinks that is independent.  Her bladder/bowel work okay.  Tremor is much better in the hands, L more than R, but it is better now that she is taking herbs that her chiropractor gave her.  She is receiving kinesiology treatment.  She feels that her walking is "awkward" because of the foot.  She has trouble with arm swing.  She reports some shakiness with  handwriting (R hand dominant) but no micrographia.  She is sleeping well.  She has no vivid dreams.  No sleep walking/talking.  No diplopia.  Does have hx of scintillating scotomas that is sometimes associated with migraine.  Denies swallowing problems.  No speech changes per pt but husband states that sometimes pt inserts one word for another or has word finding trouble.  Has "no smell" but able to taste, but thinks that there is some loss because of not being able to taste food.  She admits that she has been researching various causes of her symptoms, including Parkinson's and dystonias.  She requests a DaT scan.  11/19/13 update:  The patient is following up today, accompanied by her husband who helps to supplement the history.  Since our last visit, the patient did undergo a DaT scan.  No films are available.  However, I did get the report.  There was asymmetric uptake, with no uptake in the right putamen and normal on the left.  Pt opted to hold on the MRIs ordered last visit.  We did start carbidopa/levodopa last visit and the patient states that it is about 30% beneficial for her symptoms.   03/21/14 update:  This patient is accompanied in the office by her spouse who supplements  the history.  Last visit, I increased the patients carbidopa/levodopa 25/100 to qid but she hasn't really need to do that.  She is overall doing much better.  I did send her for PT/OT and that has helped.   She has had some constipation and been on miralax for that.  She states that she had severe "brain fog" yesterday and thinks that it is related to the levodopa.  She was driving and was confused about where she was.  She has had some other minor episodes prior to that of feeling confused but nothing like that.  She states that her chiropracter gave her "brain medicine" (2 of them, one she calls fito-brain B12) and she states that medicine allowed her arm to swing. She sees her chiropractor daily now and is very pleased with her  services.  The levodopa did help so her leg no longer tremors.   She does think that she has had her thyroid checked this year and states that her synthroid was increased last visit but cannot remember when that was.    07/11/14 update:  The patient is following up today, accompanied by her husband who supplements the history.  The patient is currently on carbidopa/levodopa 25/100, 4 times per day (went back up from 3 times per day because of "jerking" and foot dystonia).  I reviewed records since her last visit.  She was in her wife for vacation in October when she called me.  She stated that she initially had a scratchy throat and then suddenly she was not able to taste.  This sounded viral to me, but I told her that nonetheless it was not related to [place], as symptoms do not come on that acutely.  I told her that she could follow up with a physician in [place].   She states that it took 6-8 weeks to go away, but her husband got the same thing.   She is not falling currently but thinks that her balance is really not good.  She cannot remember the last PT date.  She still has trouble with her memory.  That is her biggest c/o.  She is having word finding trouble.    12/02/14 update:  The patient is f/u today.  She is on carbidopa/levodopa 25/100 four times a day.  Her sister in law died a few weeks ago after complications from a fall.  This was unexpected.  She was only in her 54's.  She went for neuropsych testing since our last visit.  No evidence of dementia.  Evidence of adjustment disorder with mixed anxiety/depression and felt that she may benefit from brief trial of psychotherapy.  Pt states that she is doing well in that regard and does not think she needs the therapy right now and she did start some physical therapy since last visit, but she states that she did not end up completing it because something else came up, but feels that she has been doing well.  Her left foot only turns and when she is very  tired.  She has diplopia when looking left.  She has an appt with neuro-opth (Dr. Hassell Done) at the end of may.    04/13/15 update:  The patient is following up today regarding her MSA.  She is carbidopa/levodopa 25/100 25/100, 1 tablet 4 times per day.  Overall, the patient reports that she is doing well.  She has gotten a new job at the [employer] since our last visit.  She is working at  the corporate office as a receptionist.  She is having to multitask which is good for her.  Since her last visit, she did see Dr. Sanda Klein at [facility] in regards to her diplopia.  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  He said that the diplopia is not a component directly of MSA, but rather perhaps a loss of a compensatory ability secondary to [place].  He did not know why the symptoms were just with left gaze as the same dissociation occurred in both right and left gaze.  The patient was not interested in surgery, but he did recommend prisms and the patient was going to follow-up with Dr. Ellie Lunch at [facility] ophthalmology to discuss that further.  She has a temporary prism now and she can drive again now with it and she loves that.  She asks me about a permanent license plate (handicap) instead of the placard as she forgets to hang the placard sometimes.  She has had no falls but she has to be very careful.  She runs into walls a lot.  She is doing okay with swallowing (nothing worse than usual per pt).  She is walking and riding her bike for exercise.  If she doesn't her L leg will stiffen up.   Current/Previously tried tremor medications: tried propranolol on one occasion but seemed to make her sick and she did not want to take the next pill  Current medications that may exacerbate tremor:  proair  Outside reports reviewed: historical medical records.  Allergies  Allergen Reactions  . Alendronate Other (See Comments)    headache  . Symbicort  [Budesonide-Formoterol Fumarate] Other (See Comments)    Shakes and muscle weakness    Current Outpatient Prescriptions on File Prior to Visit  Medication Sig Dispense Refill  . CALCIUM LACTATE PO Take 6 tablets by mouth daily.    . carbidopa-levodopa (SINEMET IR) 25-100 MG per tablet Take 1 tablet by mouth 4 (four) times daily. 120 tablet 5  . Cholecalciferol (VITAMIN D-3 PO) Take 3 tablets by mouth daily.    . citalopram (CELEXA) 20 MG tablet Take 20 mg by mouth daily.     Marland Kitchen levothyroxine (SYNTHROID, LEVOTHROID) 88 MCG tablet Take 88 mcg by mouth at bedtime.    . Multiple Vitamin (MULTIVITAMIN WITH MINERALS) TABS tablet Take 2 tablets by mouth daily.    Marland Kitchen omeprazole (PRILOSEC OTC) 20 MG tablet Take 20 mg by mouth daily before breakfast.    . OVER THE COUNTER MEDICATION Take 1 tablet by mouth daily. Neurotrophine - 3 tablets daily Phyto Brain EK5L - 1 teaspoon daily Nitric Balance - 1 teaspoon daily    . polyethylene glycol (MIRALAX / GLYCOLAX) packet Take 17 g by mouth daily.    . Probiotic Product (PROBIOTIC DAILY PO) Take 1 tablet by mouth daily.    Marland Kitchen QUERCETIN PO Take 4 tablets by mouth daily.    . VOLTAREN 1 % GEL      No current facility-administered medications on file prior to visit.    Past Medical History  Diagnosis Date  . Hypothyroidism   . Asthma   . Nephrolithiasis     hx of kidney stones, last 6 months ago  . Arthritis   . Blood clot in vein  age 8    left leg  . Anemia   . Multiple pregnancy loss, not currently pregnant     multiple miscarriages-pt.unsure of #  . GERD (gastroesophageal reflux  disease)   . Osteopenia   . Hypertension     pt denies, in physician record  . Hyperlipidemia   . Tendinitis     left foot  . Multiple system atrophy 2015    Past Surgical History  Procedure Laterality Date  . Colonscopy  2011  . Joint replacement  05-2007    right knee replacment/revision  . Cystoscopy  2012  . Total knee revision  06/03/2012    Procedure:  TOTAL KNEE REVISION;  Surgeon: Gearlean Alf, MD;  Location: [facility];  Service: Orthopedics;  Laterality: Left;  Revision of a Left Uni Knee to a Total Knee Arthroplasty  . Breast surgery      rt. lumpectomy, benign  . Replacement total knee Left 06/05/12  . Kidney stone surgery      Social History   Social History  . Marital Status: Married    Spouse Name: N/A  . Number of Children: N/A  . Years of Education: N/A   Occupational History  . retired     Research scientist (physical sciences)   Social History Main Topics  . Smoking status: Never Smoker   . Smokeless tobacco: Never Used  . Alcohol Use: No  . Drug Use: No  . Sexual Activity: No   Other Topics Concern  . Not on file   Social History Narrative    Family Status  Relation Status Death Age  . Mother Deceased     fall, death during surgery  . Father Deceased     ? heart  . Brother Alive     healthy  . Sister Alive     2, asthma, heart problem  . Child Alive     adopted, healthy    Review of Systems A complete 10 system ROS was obtained and was negative apart from what is mentioned.   Objective:   VITALS:   There were no vitals filed for this visit. Wt Readings from Last 3 Encounters:  02/16/15 209 lb (94.802 kg)  12/02/14 212 lb (96.163 kg)  09/13/14 209 lb (94.802 kg)     Gen:  Appears stated age and in NAD. HEENT:  Normocephalic, atraumatic. The mucous membranes are moist. The superficial temporal arteries are without ropiness or tenderness. Cardiovascular: Regular rate and rhythm. Lungs: Clear to auscultation bilaterally. Neck: There are no carotid bruits noted bilaterally.  NEUROLOGICAL:  Orientation:  The patient is alert and oriented x 3.  Recent and remote memory are intact.  Attention span and concentration are normal.  Able to name objects and repeat without trouble.  Fund of knowledge is appropriate Cranial nerves: There is good facial symmetry.  Extraocular muscles are intact and visual fields are full to  confrontational testing. Speech is fluent and clear. Soft palate rises symmetrically and there is no tongue deviation. Hearing is intact to conversational tone. Tone: Tone is good throughout.  There is no spasticity or rigidity. Coordination:  The patient has good rapid alternating movements today Motor: Strength is 5/5 in the bilateral upper and lower extremities.  Shoulder shrug is equal bilaterally.  There is no pronator drift.  There are no fasciculations noted. Gait and Station: The patient is able to ambulate and has good arm swing today.  MOVEMENT EXAM: Tremor:   No tremor at all today except head in "yes" direction/  No significant foot dystonia today seen  Labs:  No results found for: TSH      Assessment/Plan:   1.  MSA, with associated dystonia. DaT scan  was positive.  She and I talked about the diagnosis.  She went back to carbidopa/levodopa 25/100 qid.  I encouraged her to continue to exercise.  Meds refilled today  -Wanted to hold on physical therapy.  She really looked quite good today  -handicap form filled out  2.  Cognitive impairment  -As above, she had neuropsych testing done in January, 2016 and there was no evidence of dementia.  There was some evidence of mixed anxiety/depression and a brief trial of psychotherapy was recommended, but the patient felt that she really did not need that.  It was recommended that we review her supplements, and I have done that.  She has added another supplement since last visit (dopatone) and although I don't think that this is necessarily helpful, the pt is finding it is.   -back to work and doing well.  I think that this has helped her.  3.  Diplopia  -Pt saw Dr. Sanda Klein at [facility] in regards to her diplopia.  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  He said that the diplopia is not a component directly of MSA, but rather perhaps a loss of a compensatory ability secondary to  [place].  She has a temporary prism now and it has really helped.  4.  Follow up is anticipated in the next few months, sooner should new neurologic issues arise.  Much greater than 50% of this visit was spent in counseling with the patient and the family.  Total face to face time:  25 min       4.  No Follow-up on file.

## 2015-07-27 ENCOUNTER — Encounter: Payer: Self-pay | Admitting: Physical Therapy

## 2015-07-27 NOTE — Therapy (Signed)
[facility] [address] [place], [place], [address] Phone: [phone]   Fax:  [phone]  Patient Details  Name: Anyely Cunning MRN: [id number] Date of Birth: 02/20/1951 Referring Provider:  No ref. provider found  Encounter Date: 07/27/2015   PHYSICAL THERAPY DISCHARGE SUMMARY  Visits from Start of Care: 3  Current functional level related to goals / functional outcomes: Goals not able to be fully addressed, due to patient cancelling remaining visits after 3rd PT visit.   Remaining deficits: -see eval-   Education / Equipment: Initiated HEP  Plan: Patient agrees to discharge.  Patient goals were not met. Patient is being discharged due to not returning since the last visit.  ?????Pt cancelled multiple visits and did not return to PT after 08/26/14 visit.   Frazier Butt., PT  Frazier Butt. 07/27/2015, 8:14 AM  [facility] [address] [place], [place], [address] Phone: [phone]   Fax:  [phone]

## 2015-08-14 ENCOUNTER — Encounter: Payer: Self-pay | Admitting: Neurology

## 2015-08-14 ENCOUNTER — Ambulatory Visit (INDEPENDENT_AMBULATORY_CARE_PROVIDER_SITE_OTHER): Payer: Medicare Other | Admitting: Neurology

## 2015-08-14 VITALS — BP 130/72 | HR 75 | Ht 66.0 in | Wt 218.0 lb

## 2015-08-14 DIAGNOSIS — G903 Multi-system degeneration of the autonomic nervous system: Secondary | ICD-10-CM

## 2015-08-14 DIAGNOSIS — G239 Degenerative disease of basal ganglia, unspecified: Secondary | ICD-10-CM

## 2015-08-14 NOTE — Progress Notes (Signed)
Subjective:   Rachel [name] was seen in consultation in the movement disorder clinic at the request of Marton Redwood, MD.  The evaluation is for tremor.  The patient is a 65 y.o. right handed female with a history of tremor.  The pt reports that she first noted shaking in the legs and arms bilaterally about 1 - 1.5 years ago.  It was intermittent.  She now notices tremor of the hands, legs and head.  She has better and worse days.  She notes that lying in the recliner helps.  She notes that walking and exercise makes it worse.  The L leg feels that it will stiffen up and cause the tremor.  There is a family hx of tremor in her mother but it was undiagnosed.    Affected by caffeine:  yes (quit drinking caffeine all together) Exacerbated by albuterol:  Yes Affected by alcohol:  unknown Affected by stress:  yes Affected by fatigue:  yes Spills soup if on spoon:  yes Spills glass of liquid if full:  yes Affects ADL's (tying shoes, brushing teeth, etc):  no Affects ability to put on makeup:  Yes  09/1813 update:  The patient is following up today regarding tremor.  She is accompanied by her spouse, who supplements the history.  I have not seen the patient since her first appointment in August, 2014.  At that point in time, she did not want any medication for the tremor.  She did have an MRI of the brain in August, 2014 that was normal.  I did review this.  Over the course of time, she has noted tremor in the L foot and hand.  She states that if she uses the hands, her L leg will stiffen and will eventually hurt.  Minimal neck pain.  Has back pain but thinks that is independent.  Her bladder/bowel work okay.  Tremor is much better in the hands, L more than R, but it is better now that she is taking herbs that her chiropractor gave her.  She is receiving kinesiology treatment.  She feels that her walking is "awkward" because of the foot.  She has trouble with arm swing.  She reports some shakiness with  handwriting (R hand dominant) but no micrographia.  She is sleeping well.  She has no vivid dreams.  No sleep walking/talking.  No diplopia.  Does have hx of scintillating scotomas that is sometimes associated with migraine.  Denies swallowing problems.  No speech changes per pt but husband states that sometimes pt inserts one word for another or has word finding trouble.  Has "no smell" but able to taste, but thinks that there is some loss because of not being able to taste food.  She admits that she has been researching various causes of her symptoms, including Parkinson's and dystonias.  She requests a DaT scan.  11/19/13 update:  The patient is following up today, accompanied by her husband who helps to supplement the history.  Since our last visit, the patient did undergo a DaT scan.  No films are available.  However, I did get the report.  There was asymmetric uptake, with no uptake in the right putamen and normal on the left.  Pt opted to hold on the MRIs ordered last visit.  We did start carbidopa/levodopa last visit and the patient states that it is about 30% beneficial for her symptoms.   03/21/14 update:  This patient is accompanied in the office by her spouse who supplements  the history.  Last visit, I increased the patients carbidopa/levodopa 25/100 to qid but she hasn't really need to do that.  She is overall doing much better.  I did send her for PT/OT and that has helped.   She has had some constipation and been on miralax for that.  She states that she had severe "brain fog" yesterday and thinks that it is related to the levodopa.  She was driving and was confused about where she was.  She has had some other minor episodes prior to that of feeling confused but nothing like that.  She states that her chiropracter gave her "brain medicine" (2 of them, one she calls fito-brain B12) and she states that medicine allowed her arm to swing. She sees her chiropractor daily now and is very pleased with her  services.  The levodopa did help so her leg no longer tremors.   She does think that she has had her thyroid checked this year and states that her synthroid was increased last visit but cannot remember when that was.    07/11/14 update:  The patient is following up today, accompanied by her husband who supplements the history.  The patient is currently on carbidopa/levodopa 25/100, 4 times per day (went back up from 3 times per day because of "jerking" and foot dystonia).  I reviewed records since her last visit.  She was in her wife for vacation in October when she called me.  She stated that she initially had a scratchy throat and then suddenly she was not able to taste.  This sounded viral to me, but I told her that nonetheless it was not related to [place], as symptoms do not come on that acutely.  I told her that she could follow up with a physician in [place].   She states that it took 6-8 weeks to go away, but her husband got the same thing.   She is not falling currently but thinks that her balance is really not good.  She cannot remember the last PT date.  She still has trouble with her memory.  That is her biggest c/o.  She is having word finding trouble.    12/02/14 update:  The patient is f/u today.  She is on carbidopa/levodopa 25/100 four times a day.  Her sister in law died a few weeks ago after complications from a fall.  This was unexpected.  She was only in her 54's.  She went for neuropsych testing since our last visit.  No evidence of dementia.  Evidence of adjustment disorder with mixed anxiety/depression and felt that she may benefit from brief trial of psychotherapy.  Pt states that she is doing well in that regard and does not think she needs the therapy right now and she did start some physical therapy since last visit, but she states that she did not end up completing it because something else came up, but feels that she has been doing well.  Her left foot only turns and when she is very  tired.  She has diplopia when looking left.  She has an appt with neuro-opth (Dr. Hassell Done) at the end of may.    04/13/15 update:  The patient is following up today regarding her MSA.  She is carbidopa/levodopa 25/100 25/100, 1 tablet 4 times per day.  Overall, the patient reports that she is doing well.  She has gotten a new job at the [employer] since our last visit.  She is working at  the corporate office as a receptionist.  She is having to multitask which is good for her.  Since her last visit, she did see Dr. Sanda Klein at [facility] in regards to her diplopia.  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  He said that the diplopia is not a component directly of MSA, but rather perhaps a loss of a compensatory ability secondary to [place].  He did not know why the symptoms were just with left gaze as the same dissociation occurred in both right and left gaze.  The patient was not interested in surgery, but he did recommend prisms and the patient was going to follow-up with Dr. Ellie Lunch at [facility] ophthalmology to discuss that further.  She has a temporary prism now and she can drive again now with it and she loves that.  She asks me about a permanent license plate (handicap) instead of the placard as she forgets to hang the placard sometimes.  She has had no falls but she has to be very careful.  She runs into walls a lot.  She is doing okay with swallowing (nothing worse than usual per pt).  She is walking and riding her bike for exercise.  If she doesn't her L leg will stiffen up.  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  She is on carbidopa/levodopa 25/100, 1 tablet 4 times per day.  She has had more tremor of the L leg and "it just drives me crazy."  She has had more constipation and miralax became less effective.  She has not had any falls since last visit.  No hallucinations.  No lightheadedness or near syncope.   Physical therapy notes indicate that the patient canceled multiple visits and ultimately was discharged because of that.  She states that at the time she felt that she didn't need it but now she has gotten weaker.  She has backed down on the supplements because she was getting sick; she is just taking Calium, D3 and still taking the "dopatone."  She is also very tired because she is not sleeping well.   Current/Previously tried tremor medications: tried propranolol on one occasion but seemed to make her sick and she did not want to take the next pill  Current medications that may exacerbate tremor:  proair  Outside reports reviewed: historical medical records.  Allergies  Allergen Reactions  . Alendronate Other (See Comments)    headache  . Symbicort [Budesonide-Formoterol Fumarate] Other (See Comments)    Shakes and muscle weakness    Current Outpatient Prescriptions on File Prior to Visit  Medication Sig Dispense Refill  . CALCIUM LACTATE PO Take 6 tablets by mouth daily.    . carbidopa-levodopa (SINEMET IR) 25-100 MG per tablet Take 1 tablet by mouth 4 (four) times daily. 120 tablet 5  . Cholecalciferol (VITAMIN D-3 PO) Take 3 tablets by mouth daily.    . citalopram (CELEXA) 20 MG tablet Take 20 mg by mouth daily.     Marland Kitchen levothyroxine (SYNTHROID, LEVOTHROID) 88 MCG tablet Take 88 mcg by mouth at bedtime.    . Multiple Vitamin (MULTIVITAMIN WITH MINERALS) TABS tablet Take 2 tablets by mouth daily.    Marland Kitchen omeprazole (PRILOSEC OTC) 20 MG tablet Take 20 mg by mouth daily before breakfast.    . OVER THE COUNTER MEDICATION Take 1 tablet by mouth daily. Neurotrophine - 3 tablets daily Phyto Brain EK5L - 1 teaspoon daily Nitric  Balance - 1 teaspoon daily dopotone    . polyethylene glycol (MIRALAX / GLYCOLAX) packet Take 17 g by mouth daily.    . Probiotic Product (PROBIOTIC DAILY PO) Take 1 tablet by mouth daily.    Marland Kitchen QUERCETIN PO Take 4 tablets by mouth daily.    . VOLTAREN 1 % GEL       No current facility-administered medications on file prior to visit.    Past Medical History  Diagnosis Date  . Hypothyroidism   . Asthma   . Nephrolithiasis     hx of kidney stones, last 6 months ago  . Arthritis   . Blood clot in vein  age 86    left leg  . Anemia   . Multiple pregnancy loss, not currently pregnant     multiple miscarriages-pt.unsure of #  . GERD (gastroesophageal reflux disease)   . Osteopenia   . Hypertension     pt denies, in physician record  . Hyperlipidemia   . Tendinitis     left foot  . Multiple system atrophy ([place]) 2015    Past Surgical History  Procedure Laterality Date  . Colonscopy  2011  . Joint replacement  05-2007    right knee replacment/revision  . Cystoscopy  2012  . Total knee revision  06/03/2012    Procedure: TOTAL KNEE REVISION;  Surgeon: Gearlean Alf, MD;  Location: [facility];  Service: Orthopedics;  Laterality: Left;  Revision of a Left Uni Knee to a Total Knee Arthroplasty  . Breast surgery      rt. lumpectomy, benign  . Replacement total knee Left 06/05/12  . Kidney stone surgery      Social History   Social History  . Marital Status: Married    Spouse Name: N/A  . Number of Children: N/A  . Years of Education: N/A   Occupational History  . retired     Research scientist (physical sciences)   Social History Main Topics  . Smoking status: Never Smoker   . Smokeless tobacco: Never Used  . Alcohol Use: No  . Drug Use: No  . Sexual Activity: No   Other Topics Concern  . Not on file   Social History Narrative    Family Status  Relation Status Death Age  . Mother Deceased     fall, death during surgery  . Father Deceased     ? heart  . Brother Alive     healthy  . Sister Alive     2, asthma, heart problem  . Child Alive     adopted, healthy    Review of Systems A complete 10 system ROS was obtained and was negative apart from what is mentioned.   Objective:   VITALS:   Filed Vitals:   08/14/15 0819  BP: 130/72   Pulse: 75  Height: 5\' 6"  (1.676 m)  Weight: 218 lb (98.884 kg)   Wt Readings from Last 3 Encounters:  08/14/15 218 lb (98.884 kg)  04/13/15 213 lb (96.616 kg)  02/16/15 209 lb (94.802 kg)     Gen:  Appears stated age and in NAD. HEENT:  Normocephalic, atraumatic. The mucous membranes are moist. The superficial temporal arteries are without ropiness or tenderness. Cardiovascular: Regular rate and rhythm. Lungs: Clear to auscultation bilaterally. Neck: There are no carotid bruits noted bilaterally.  NEUROLOGICAL:  Orientation:  The patient is alert and oriented x 3.  Recent and remote memory are intact.  Attention span and concentration are normal.  Able to name  objects and repeat without trouble.  Fund of knowledge is appropriate Cranial nerves: There is good facial symmetry.  Extraocular muscles are intact and visual fields are full to confrontational testing. Speech is fluent and clear. Soft palate rises symmetrically and there is no tongue deviation. Hearing is intact to conversational tone. Tone: Tone is good throughout.  There is no spasticity or rigidity. Coordination:  The patient has good rapid alternating movements today Motor: Strength is 5/5 in the bilateral upper and lower extremities.  Shoulder shrug is equal bilaterally.  There is no pronator drift.  There are no fasciculations noted. Gait and Station: The patient is able to ambulate and has good arm swing today.  MOVEMENT EXAM: Tremor:   There is near constant LLE tremor (has not taken medication yet today)  Labs:  No results found for: TSH      Assessment/Plan:   1.  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  She and I talked about the diagnosis.  She went back to carbidopa/levodopa 25/100 qid.  She is more tremulous today but hasn't taken her medication. Think that her bigger issue is constipation.  Will try to fix that first but if that doesn't help will alter meds  -Wanted her to restart  PT.  -hold dopatone.  She has eliminated most of her other supplements for now.  -talked to her about integrative therapies    2.  Cognitive impairment  -As above, she had neuropsych testing done in January, 2016 and there was no evidence of dementia.  There was some evidence of mixed anxiety/depression and a brief trial of psychotherapy was recommended, but the patient felt that she really did not need that.  It was recommended that we review her supplements, and I have done that.  She has added another supplement since last visit (dopatone) and although I don't think that this is necessarily helpful, the pt is finding it is.   -back to work and doing well.  I think that this has helped her.  3.  Diplopia  -Pt saw Dr. Sanda Klein at [facility] in regards to her diplopia.  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  He said that the diplopia is not a component directly of MSA, but rather perhaps a loss of a compensatory ability secondary to [place].  She has a temporary prism now and it has really helped.  4.  Constipation  -copy of the rancho recipe given today  5.  Insomnia  -start melatonin, 3 mg  6.  Follow up is anticipated in the next 2 months, sooner should new neurologic issues arise.  Much greater than 50% of this visit was spent in counseling with the patient and the family.  Total face to face time:  25 min

## 2015-08-14 NOTE — Patient Instructions (Addendum)
Constipation and Parkinson's disease:  1.Rancho recipe for constipation in Parkinsons Disease:  -1 cup of bran, 2 cups of applesauce in 1 cup of prune juice 2.  Increase fiber intake (Metamucil,vegetables) 3.  Regular, moderate exercise can be beneficial. 4.  Avoid medications causing constipation, such as medications like antacids with calcium or magnesium 5.  Laxative overuse should be avoided. 6.  Stool softeners (Colace) can help with chronic constipation.  Start melatonin, 3 mg daily.    Start exercising  I want to send you back to physical therapy

## 2015-09-05 DIAGNOSIS — M9901 Segmental and somatic dysfunction of cervical region: Secondary | ICD-10-CM | POA: Diagnosis not present

## 2015-09-05 DIAGNOSIS — M9902 Segmental and somatic dysfunction of thoracic region: Secondary | ICD-10-CM | POA: Diagnosis not present

## 2015-09-05 DIAGNOSIS — M5030 Other cervical disc degeneration, unspecified cervical region: Secondary | ICD-10-CM | POA: Diagnosis not present

## 2015-09-05 DIAGNOSIS — M9903 Segmental and somatic dysfunction of lumbar region: Secondary | ICD-10-CM | POA: Diagnosis not present

## 2015-09-05 DIAGNOSIS — M9904 Segmental and somatic dysfunction of sacral region: Secondary | ICD-10-CM | POA: Diagnosis not present

## 2015-09-05 DIAGNOSIS — M5136 Other intervertebral disc degeneration, lumbar region: Secondary | ICD-10-CM | POA: Diagnosis not present

## 2015-09-12 DIAGNOSIS — M5030 Other cervical disc degeneration, unspecified cervical region: Secondary | ICD-10-CM | POA: Diagnosis not present

## 2015-09-12 DIAGNOSIS — M9903 Segmental and somatic dysfunction of lumbar region: Secondary | ICD-10-CM | POA: Diagnosis not present

## 2015-09-12 DIAGNOSIS — M9901 Segmental and somatic dysfunction of cervical region: Secondary | ICD-10-CM | POA: Diagnosis not present

## 2015-09-12 DIAGNOSIS — M9904 Segmental and somatic dysfunction of sacral region: Secondary | ICD-10-CM | POA: Diagnosis not present

## 2015-09-12 DIAGNOSIS — M5136 Other intervertebral disc degeneration, lumbar region: Secondary | ICD-10-CM | POA: Diagnosis not present

## 2015-09-12 DIAGNOSIS — M9902 Segmental and somatic dysfunction of thoracic region: Secondary | ICD-10-CM | POA: Diagnosis not present

## 2015-09-18 DIAGNOSIS — M9902 Segmental and somatic dysfunction of thoracic region: Secondary | ICD-10-CM | POA: Diagnosis not present

## 2015-09-18 DIAGNOSIS — M5136 Other intervertebral disc degeneration, lumbar region: Secondary | ICD-10-CM | POA: Diagnosis not present

## 2015-09-18 DIAGNOSIS — M5030 Other cervical disc degeneration, unspecified cervical region: Secondary | ICD-10-CM | POA: Diagnosis not present

## 2015-09-18 DIAGNOSIS — M9904 Segmental and somatic dysfunction of sacral region: Secondary | ICD-10-CM | POA: Diagnosis not present

## 2015-09-18 DIAGNOSIS — M9901 Segmental and somatic dysfunction of cervical region: Secondary | ICD-10-CM | POA: Diagnosis not present

## 2015-09-18 DIAGNOSIS — M9903 Segmental and somatic dysfunction of lumbar region: Secondary | ICD-10-CM | POA: Diagnosis not present

## 2015-09-29 DIAGNOSIS — M9901 Segmental and somatic dysfunction of cervical region: Secondary | ICD-10-CM | POA: Diagnosis not present

## 2015-09-29 DIAGNOSIS — M9904 Segmental and somatic dysfunction of sacral region: Secondary | ICD-10-CM | POA: Diagnosis not present

## 2015-09-29 DIAGNOSIS — M5136 Other intervertebral disc degeneration, lumbar region: Secondary | ICD-10-CM | POA: Diagnosis not present

## 2015-09-29 DIAGNOSIS — M5030 Other cervical disc degeneration, unspecified cervical region: Secondary | ICD-10-CM | POA: Diagnosis not present

## 2015-09-29 DIAGNOSIS — M9902 Segmental and somatic dysfunction of thoracic region: Secondary | ICD-10-CM | POA: Diagnosis not present

## 2015-09-29 DIAGNOSIS — M9903 Segmental and somatic dysfunction of lumbar region: Secondary | ICD-10-CM | POA: Diagnosis not present

## 2015-10-02 DIAGNOSIS — M5030 Other cervical disc degeneration, unspecified cervical region: Secondary | ICD-10-CM | POA: Diagnosis not present

## 2015-10-02 DIAGNOSIS — M9904 Segmental and somatic dysfunction of sacral region: Secondary | ICD-10-CM | POA: Diagnosis not present

## 2015-10-02 DIAGNOSIS — M9902 Segmental and somatic dysfunction of thoracic region: Secondary | ICD-10-CM | POA: Diagnosis not present

## 2015-10-02 DIAGNOSIS — M9903 Segmental and somatic dysfunction of lumbar region: Secondary | ICD-10-CM | POA: Diagnosis not present

## 2015-10-02 DIAGNOSIS — M9901 Segmental and somatic dysfunction of cervical region: Secondary | ICD-10-CM | POA: Diagnosis not present

## 2015-10-02 DIAGNOSIS — M5136 Other intervertebral disc degeneration, lumbar region: Secondary | ICD-10-CM | POA: Diagnosis not present

## 2015-10-09 DIAGNOSIS — M9902 Segmental and somatic dysfunction of thoracic region: Secondary | ICD-10-CM | POA: Diagnosis not present

## 2015-10-09 DIAGNOSIS — M5030 Other cervical disc degeneration, unspecified cervical region: Secondary | ICD-10-CM | POA: Diagnosis not present

## 2015-10-09 DIAGNOSIS — M5136 Other intervertebral disc degeneration, lumbar region: Secondary | ICD-10-CM | POA: Diagnosis not present

## 2015-10-09 DIAGNOSIS — M9901 Segmental and somatic dysfunction of cervical region: Secondary | ICD-10-CM | POA: Diagnosis not present

## 2015-10-09 DIAGNOSIS — M9903 Segmental and somatic dysfunction of lumbar region: Secondary | ICD-10-CM | POA: Diagnosis not present

## 2015-10-09 DIAGNOSIS — M9904 Segmental and somatic dysfunction of sacral region: Secondary | ICD-10-CM | POA: Diagnosis not present

## 2015-10-12 ENCOUNTER — Encounter: Payer: Self-pay | Admitting: Neurology

## 2015-10-12 ENCOUNTER — Ambulatory Visit (INDEPENDENT_AMBULATORY_CARE_PROVIDER_SITE_OTHER): Payer: Medicare Other | Admitting: Neurology

## 2015-10-12 VITALS — BP 98/68 | HR 76 | Ht 66.0 in

## 2015-10-12 DIAGNOSIS — G4700 Insomnia, unspecified: Secondary | ICD-10-CM

## 2015-10-12 DIAGNOSIS — G239 Degenerative disease of basal ganglia, unspecified: Secondary | ICD-10-CM

## 2015-10-12 DIAGNOSIS — G903 Multi-system degeneration of the autonomic nervous system: Secondary | ICD-10-CM

## 2015-10-12 NOTE — Progress Notes (Signed)
Subjective:   Rachel [name] was seen in consultation in the movement disorder clinic at the request of Marton Redwood, MD.  The evaluation is for tremor.  The patient is a 65 y.o. right handed female with a history of tremor.  The pt reports that she first noted shaking in the legs and arms bilaterally about 1 - 1.5 years ago.  It was intermittent.  She now notices tremor of the hands, legs and head.  She has better and worse days.  She notes that lying in the recliner helps.  She notes that walking and exercise makes it worse.  The L leg feels that it will stiffen up and cause the tremor.  There is a family hx of tremor in her mother but it was undiagnosed.    Affected by caffeine:  yes (quit drinking caffeine all together) Exacerbated by albuterol:  Yes Affected by alcohol:  unknown Affected by stress:  yes Affected by fatigue:  yes Spills soup if on spoon:  yes Spills glass of liquid if full:  yes Affects ADL's (tying shoes, brushing teeth, etc):  no Affects ability to put on makeup:  Yes  09/1813 update:  The patient is following up today regarding tremor.  She is accompanied by her spouse, who supplements the history.  I have not seen the patient since her first appointment in August, 2014.  At that point in time, she did not want any medication for the tremor.  She did have an MRI of the brain in August, 2014 that was normal.  I did review this.  Over the course of time, she has noted tremor in the L foot and hand.  She states that if she uses the hands, her L leg will stiffen and will eventually hurt.  Minimal neck pain.  Has back pain but thinks that is independent.  Her bladder/bowel work okay.  Tremor is much better in the hands, L more than R, but it is better now that she is taking herbs that her chiropractor gave her.  She is receiving kinesiology treatment.  She feels that her walking is "awkward" because of the foot.  She has trouble with arm swing.  She reports some shakiness with  handwriting (R hand dominant) but no micrographia.  She is sleeping well.  She has no vivid dreams.  No sleep walking/talking.  No diplopia.  Does have hx of scintillating scotomas that is sometimes associated with migraine.  Denies swallowing problems.  No speech changes per pt but husband states that sometimes pt inserts one word for another or has word finding trouble.  Has "no smell" but able to taste, but thinks that there is some loss because of not being able to taste food.  She admits that she has been researching various causes of her symptoms, including Parkinson's and dystonias.  She requests a DaT scan.  11/19/13 update:  The patient is following up today, accompanied by her husband who helps to supplement the history.  Since our last visit, the patient did undergo a DaT scan.  No films are available.  However, I did get the report.  There was asymmetric uptake, with no uptake in the right putamen and normal on the left.  Pt opted to hold on the MRIs ordered last visit.  We did start carbidopa/levodopa last visit and the patient states that it is about 30% beneficial for her symptoms.   03/21/14 update:  This patient is accompanied in the office by her spouse who supplements  the history.  Last visit, I increased the patients carbidopa/levodopa 25/100 to qid but she hasn't really need to do that.  She is overall doing much better.  I did send her for PT/OT and that has helped.   She has had some constipation and been on miralax for that.  She states that she had severe "brain fog" yesterday and thinks that it is related to the levodopa.  She was driving and was confused about where she was.  She has had some other minor episodes prior to that of feeling confused but nothing like that.  She states that her chiropracter gave her "brain medicine" (2 of them, one she calls fito-brain B12) and she states that medicine allowed her arm to swing. She sees her chiropractor daily now and is very pleased with her  services.  The levodopa did help so her leg no longer tremors.   She does think that she has had her thyroid checked this year and states that her synthroid was increased last visit but cannot remember when that was.    07/11/14 update:  The patient is following up today, accompanied by her husband who supplements the history.  The patient is currently on carbidopa/levodopa 25/100, 4 times per day (went back up from 3 times per day because of "jerking" and foot dystonia).  I reviewed records since her last visit.  She was in her wife for vacation in October when she called me.  She stated that she initially had a scratchy throat and then suddenly she was not able to taste.  This sounded viral to me, but I told her that nonetheless it was not related to [place], as symptoms do not come on that acutely.  I told her that she could follow up with a physician in [place].   She states that it took 6-8 weeks to go away, but her husband got the same thing.   She is not falling currently but thinks that her balance is really not good.  She cannot remember the last PT date.  She still has trouble with her memory.  That is her biggest c/o.  She is having word finding trouble.    12/02/14 update:  The patient is f/u today.  She is on carbidopa/levodopa 25/100 four times a day.  Her sister in law died a few weeks ago after complications from a fall.  This was unexpected.  She was only in her 54's.  She went for neuropsych testing since our last visit.  No evidence of dementia.  Evidence of adjustment disorder with mixed anxiety/depression and felt that she may benefit from brief trial of psychotherapy.  Pt states that she is doing well in that regard and does not think she needs the therapy right now and she did start some physical therapy since last visit, but she states that she did not end up completing it because something else came up, but feels that she has been doing well.  Her left foot only turns and when she is very  tired.  She has diplopia when looking left.  She has an appt with neuro-opth (Dr. Hassell Done) at the end of may.    04/13/15 update:  The patient is following up today regarding her MSA.  She is carbidopa/levodopa 25/100 25/100, 1 tablet 4 times per day.  Overall, the patient reports that she is doing well.  She has gotten a new job at the [employer] since our last visit.  She is working at  the corporate office as a receptionist.  She is having to multitask which is good for her.  Since her last visit, she did see Dr. Sanda Klein at [facility] in regards to her diplopia.  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  He said that the diplopia is not a component directly of MSA, but rather perhaps a loss of a compensatory ability secondary to [place].  He did not know why the symptoms were just with left gaze as the same dissociation occurred in both right and left gaze.  The patient was not interested in surgery, but he did recommend prisms and the patient was going to follow-up with Dr. Ellie Lunch at [facility] ophthalmology to discuss that further.  She has a temporary prism now and she can drive again now with it and she loves that.  She asks me about a permanent license plate (handicap) instead of the placard as she forgets to hang the placard sometimes.  She has had no falls but she has to be very careful.  She runs into walls a lot.  She is doing okay with swallowing (nothing worse than usual per pt).  She is walking and riding her bike for exercise.  If she doesn't her L leg will stiffen up.  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  She is on carbidopa/levodopa 25/100, 1 tablet 4 times per day.  She has had more tremor of the L leg and "it just drives me crazy."  She has had more constipation and miralax became less effective.  She has not had any falls since last visit.  No hallucinations.  No lightheadedness or near syncope.   Physical therapy notes indicate that the patient canceled multiple visits and ultimately was discharged because of that.  She states that at the time she felt that she didn't need it but now she has gotten weaker.  She has backed down on the supplements because she was getting sick; she is just taking Calium, D3 and still taking the "dopatone."  She is also very tired because she is not sleeping well.  10/12/15 update:  Pt f/u today re: MSA.  She is on carbidopa/levodopa 25/100 four times a day (9am/2:30pm/7pm/11pm) and I did recommend that she stop her dopatone supplement.  She is still on this but might take it every other day.  She is taking other supplements as well ("glutathione recycler").  She does state that her chiropractor gave her a different supplement and told her to d/c dopatone and told her to take [facility] glutathione and "it made a great difference."  She is taking it a bit after the meal times.  She was given the rancho recipe for constipation and I recommended that she start melatonin for insomnia.  She didn't try the rancho recipe because bran had gluten and gluten gives her headaches so she mixes prunes juice with miralax and uses a laxative tea.  She reports today that she has not had any falls.  She has not had any hallucinations or near syncope.  She was sent for PT at last visit but then refused therapy when contacted by them.  She is not exercising regularly but is occasionally riding her bike.  She works 4.5 daily.   Current/Previously tried tremor medications: tried propranolol on one occasion but seemed to make her sick and she did not want to take the next pill  Current medications that may exacerbate tremor:  proair  Outside reports reviewed: historical medical records.  Allergies  Allergen Reactions  . Alendronate Other (See Comments)    headache  . Symbicort [Budesonide-Formoterol Fumarate] Other (See Comments)    Shakes and muscle weakness    Current Outpatient  Prescriptions on File Prior to Visit  Medication Sig Dispense Refill  . CALCIUM LACTATE PO Take 6 tablets by mouth daily.    . carbidopa-levodopa (SINEMET IR) 25-100 MG per tablet Take 1 tablet by mouth 4 (four) times daily. 120 tablet 5  . Cholecalciferol (VITAMIN D-3 PO) Take 3 tablets by mouth daily.    . citalopram (CELEXA) 20 MG tablet Take 20 mg by mouth daily.     Marland Kitchen levothyroxine (SYNTHROID, LEVOTHROID) 88 MCG tablet Take 88 mcg by mouth at bedtime.    . Multiple Vitamin (MULTIVITAMIN WITH MINERALS) TABS tablet Take 2 tablets by mouth daily.    Marland Kitchen omeprazole (PRILOSEC OTC) 20 MG tablet Take 20 mg by mouth daily before breakfast.    . Probiotic Product (PROBIOTIC DAILY PO) Take 1 tablet by mouth daily.    . VOLTAREN 1 % GEL      No current facility-administered medications on file prior to visit.    Past Medical History  Diagnosis Date  . Hypothyroidism   . Asthma   . Nephrolithiasis     hx of kidney stones, last 6 months ago  . Arthritis   . Blood clot in vein  age 45    left leg  . Anemia   . Multiple pregnancy loss, not currently pregnant     multiple miscarriages-pt.unsure of #  . GERD (gastroesophageal reflux disease)   . Osteopenia   . Hypertension     pt denies, in physician record  . Hyperlipidemia   . Tendinitis     left foot  . Multiple system atrophy ([place]) 2015    Past Surgical History  Procedure Laterality Date  . Colonscopy  2011  . Joint replacement  05-2007    right knee replacment/revision  . Cystoscopy  2012  . Total knee revision  06/03/2012    Procedure: TOTAL KNEE REVISION;  Surgeon: Gearlean Alf, MD;  Location: [facility] OR[facility];  Service: Orthopedics;  Laterality: Left;  Revision of a Left Uni Knee to a Total Knee Arthroplasty  . Breast surgery      rt. lumpectomy, benign  . Replacement total knee Left 06/05/12  . Kidney stone surgery      Social History   Social History  . Marital Status: Married    Spouse Name: N/A  . Number of Children: N/A   . Years of Education: N/A   Occupational History  . retired     Research scientist (physical sciences)   Social History Main Topics  . Smoking status: Never Smoker   . Smokeless tobacco: Never Used  . Alcohol Use: No  . Drug Use: No  . Sexual Activity: No   Other Topics Concern  . Not on file   Social History Narrative    Family Status  Relation Status Death Age  . Mother Deceased     fall, death during surgery  . Father Deceased     ? heart  . Brother Alive     healthy  . Sister Alive     2, asthma, heart problem  . Child Alive     adopted, healthy    Review of Systems A complete 10 system ROS was obtained and was negative apart from what is mentioned.   Objective:  VITALS:   Filed Vitals:   10/12/15 0947  BP: 98/68  Pulse: 76  Height: 5\' 6"  (1.676 m)   Wt Readings from Last 3 Encounters:  08/14/15 218 lb (98.884 kg)  04/13/15 213 lb (96.616 kg)  02/16/15 209 lb (94.802 kg)     Gen:  Appears stated age and in NAD. HEENT:  Normocephalic, atraumatic. The mucous membranes are moist. The superficial temporal arteries are without ropiness or tenderness. Cardiovascular: Regular rate and rhythm. Lungs: Clear to auscultation bilaterally. Neck: There are no carotid bruits noted bilaterally.  NEUROLOGICAL:  Orientation:  The patient is alert and oriented x 3.  Recent and remote memory are intact.  Attention span and concentration are normal.  Able to name objects and repeat without trouble.  Fund of knowledge is appropriate Cranial nerves: There is good facial symmetry.  Extraocular muscles are intact and visual fields are full to confrontational testing. Speech is fluent and clear. Soft palate rises symmetrically and there is no tongue deviation. Hearing is intact to conversational tone. Tone: Tone is good throughout.  There is no spasticity or rigidity. Coordination:  The patient has good rapid alternating movements today Motor: Strength is 5/5 in the bilateral upper and lower  extremities.  Shoulder shrug is equal bilaterally.  There is no pronator drift.  There are no fasciculations noted. Gait and Station: The patient is able to ambulate and has good arm swing today.  MOVEMENT EXAM: Tremor:   There is intermittent LLE tremor   Labs:  No results found for: TSH      Assessment/Plan:   1.  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  She and I talked about the diagnosis.  She will continue on carbidopa/levodopa 25/100 qid.  She looks good on this medication.    -Wanted her to restart PT but she could not afford.  Encouraged her to be faithful with her exercise on her bike.    -hold dopatone.   -talked to her about integrative therapies    2.  Cognitive impairment  -As above, she had neuropsych testing done in January, 2016 and there was no evidence of dementia.  There was some evidence of mixed anxiety/depression and a brief trial of psychotherapy was recommended, but the patient felt that she really did not need that.  It was recommended that we review her supplements, and I have done that.  She has added another supplement since last visit (dopatone) and although I don't think that this is necessarily helpful, the pt is finding it is.   -back to work and doing well.  I think that this has helped her.  3.  Diplopia  -Pt saw Dr. Sanda Klein at [facility] in regards to her diplopia.  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  He said that the diplopia is not a component directly of MSA, but rather perhaps a loss of a compensatory ability secondary to [place].  She has a temporary prism now and it has really helped.  4.  Constipation  -using a tea, prune juice and miralax  5.  Insomnia  -using melatonin, 3 mg and states that it is working  6.  Follow up is anticipated in the next 3-4 months, sooner should new neurologic issues arise.  Much greater than 50% of this visit was spent in counseling with  the patient and the family.  Total face to face time:  25 min

## 2015-10-16 DIAGNOSIS — M9902 Segmental and somatic dysfunction of thoracic region: Secondary | ICD-10-CM | POA: Diagnosis not present

## 2015-10-16 DIAGNOSIS — M9904 Segmental and somatic dysfunction of sacral region: Secondary | ICD-10-CM | POA: Diagnosis not present

## 2015-10-16 DIAGNOSIS — M5030 Other cervical disc degeneration, unspecified cervical region: Secondary | ICD-10-CM | POA: Diagnosis not present

## 2015-10-16 DIAGNOSIS — M5136 Other intervertebral disc degeneration, lumbar region: Secondary | ICD-10-CM | POA: Diagnosis not present

## 2015-10-16 DIAGNOSIS — M9901 Segmental and somatic dysfunction of cervical region: Secondary | ICD-10-CM | POA: Diagnosis not present

## 2015-10-16 DIAGNOSIS — M9903 Segmental and somatic dysfunction of lumbar region: Secondary | ICD-10-CM | POA: Diagnosis not present

## 2015-10-20 ENCOUNTER — Telehealth: Payer: Self-pay | Admitting: Neurology

## 2015-10-20 NOTE — Telephone Encounter (Signed)
Spoke with patient- she states she has had a lot of trouble in the past two weeks with her left leg getting stiff and then weak. She states when this happens she has a lot of trouble walking. She has been riding her bike daily and sometimes twice daily. She states this does help with the stiffness some - but sometimes she is at work or in bed when this happens. This has happened before but it would come and go. She states this seems to be constant the last two weeks. She is wondering if she should increase or switch medication or she asks if a muscle relaxer would help. Aware this wouldn't be something she could take and go to work. Please advise? If call after 12, we should call husband Rachel [name] at [phone].

## 2015-10-20 NOTE — Telephone Encounter (Signed)
Patient made aware.

## 2015-10-20 NOTE — Telephone Encounter (Signed)
Patient would like you to call her back please. Her call back number is [phone]. Rachel [name] 07/23/1951. Thank you

## 2015-10-20 NOTE — Telephone Encounter (Signed)
Have her try extra 1/2 tablet of levodopa but only when it happens

## 2015-10-23 DIAGNOSIS — M545 Low back pain: Secondary | ICD-10-CM | POA: Diagnosis not present

## 2015-10-23 DIAGNOSIS — M9902 Segmental and somatic dysfunction of thoracic region: Secondary | ICD-10-CM | POA: Diagnosis not present

## 2015-10-23 DIAGNOSIS — M9901 Segmental and somatic dysfunction of cervical region: Secondary | ICD-10-CM | POA: Diagnosis not present

## 2015-10-23 DIAGNOSIS — M9903 Segmental and somatic dysfunction of lumbar region: Secondary | ICD-10-CM | POA: Diagnosis not present

## 2015-10-23 DIAGNOSIS — M5136 Other intervertebral disc degeneration, lumbar region: Secondary | ICD-10-CM | POA: Diagnosis not present

## 2015-10-23 DIAGNOSIS — M5030 Other cervical disc degeneration, unspecified cervical region: Secondary | ICD-10-CM | POA: Diagnosis not present

## 2015-10-30 DIAGNOSIS — M9901 Segmental and somatic dysfunction of cervical region: Secondary | ICD-10-CM | POA: Diagnosis not present

## 2015-10-30 DIAGNOSIS — M9903 Segmental and somatic dysfunction of lumbar region: Secondary | ICD-10-CM | POA: Diagnosis not present

## 2015-10-30 DIAGNOSIS — M5136 Other intervertebral disc degeneration, lumbar region: Secondary | ICD-10-CM | POA: Diagnosis not present

## 2015-10-30 DIAGNOSIS — M545 Low back pain: Secondary | ICD-10-CM | POA: Diagnosis not present

## 2015-10-30 DIAGNOSIS — M9902 Segmental and somatic dysfunction of thoracic region: Secondary | ICD-10-CM | POA: Diagnosis not present

## 2015-10-30 DIAGNOSIS — M5030 Other cervical disc degeneration, unspecified cervical region: Secondary | ICD-10-CM | POA: Diagnosis not present

## 2015-11-08 DIAGNOSIS — M545 Low back pain: Secondary | ICD-10-CM | POA: Diagnosis not present

## 2015-11-08 DIAGNOSIS — M9903 Segmental and somatic dysfunction of lumbar region: Secondary | ICD-10-CM | POA: Diagnosis not present

## 2015-11-08 DIAGNOSIS — M5030 Other cervical disc degeneration, unspecified cervical region: Secondary | ICD-10-CM | POA: Diagnosis not present

## 2015-11-08 DIAGNOSIS — M9902 Segmental and somatic dysfunction of thoracic region: Secondary | ICD-10-CM | POA: Diagnosis not present

## 2015-11-08 DIAGNOSIS — M5136 Other intervertebral disc degeneration, lumbar region: Secondary | ICD-10-CM | POA: Diagnosis not present

## 2015-11-08 DIAGNOSIS — M9901 Segmental and somatic dysfunction of cervical region: Secondary | ICD-10-CM | POA: Diagnosis not present

## 2015-11-13 DIAGNOSIS — M9901 Segmental and somatic dysfunction of cervical region: Secondary | ICD-10-CM | POA: Diagnosis not present

## 2015-11-13 DIAGNOSIS — M9903 Segmental and somatic dysfunction of lumbar region: Secondary | ICD-10-CM | POA: Diagnosis not present

## 2015-11-13 DIAGNOSIS — M5030 Other cervical disc degeneration, unspecified cervical region: Secondary | ICD-10-CM | POA: Diagnosis not present

## 2015-11-13 DIAGNOSIS — M9902 Segmental and somatic dysfunction of thoracic region: Secondary | ICD-10-CM | POA: Diagnosis not present

## 2015-11-13 DIAGNOSIS — M5136 Other intervertebral disc degeneration, lumbar region: Secondary | ICD-10-CM | POA: Diagnosis not present

## 2015-11-13 DIAGNOSIS — M545 Low back pain: Secondary | ICD-10-CM | POA: Diagnosis not present

## 2015-11-27 DIAGNOSIS — M9902 Segmental and somatic dysfunction of thoracic region: Secondary | ICD-10-CM | POA: Diagnosis not present

## 2015-11-27 DIAGNOSIS — M9901 Segmental and somatic dysfunction of cervical region: Secondary | ICD-10-CM | POA: Diagnosis not present

## 2015-11-27 DIAGNOSIS — M5136 Other intervertebral disc degeneration, lumbar region: Secondary | ICD-10-CM | POA: Diagnosis not present

## 2015-11-27 DIAGNOSIS — M9903 Segmental and somatic dysfunction of lumbar region: Secondary | ICD-10-CM | POA: Diagnosis not present

## 2015-11-27 DIAGNOSIS — M545 Low back pain: Secondary | ICD-10-CM | POA: Diagnosis not present

## 2015-11-27 DIAGNOSIS — M5030 Other cervical disc degeneration, unspecified cervical region: Secondary | ICD-10-CM | POA: Diagnosis not present

## 2015-12-04 DIAGNOSIS — M545 Low back pain: Secondary | ICD-10-CM | POA: Diagnosis not present

## 2015-12-04 DIAGNOSIS — M5030 Other cervical disc degeneration, unspecified cervical region: Secondary | ICD-10-CM | POA: Diagnosis not present

## 2015-12-04 DIAGNOSIS — M9902 Segmental and somatic dysfunction of thoracic region: Secondary | ICD-10-CM | POA: Diagnosis not present

## 2015-12-04 DIAGNOSIS — M9901 Segmental and somatic dysfunction of cervical region: Secondary | ICD-10-CM | POA: Diagnosis not present

## 2015-12-04 DIAGNOSIS — M9903 Segmental and somatic dysfunction of lumbar region: Secondary | ICD-10-CM | POA: Diagnosis not present

## 2015-12-04 DIAGNOSIS — M5136 Other intervertebral disc degeneration, lumbar region: Secondary | ICD-10-CM | POA: Diagnosis not present

## 2015-12-12 DIAGNOSIS — M9902 Segmental and somatic dysfunction of thoracic region: Secondary | ICD-10-CM | POA: Diagnosis not present

## 2015-12-12 DIAGNOSIS — M9901 Segmental and somatic dysfunction of cervical region: Secondary | ICD-10-CM | POA: Diagnosis not present

## 2015-12-12 DIAGNOSIS — M545 Low back pain: Secondary | ICD-10-CM | POA: Diagnosis not present

## 2015-12-12 DIAGNOSIS — M9903 Segmental and somatic dysfunction of lumbar region: Secondary | ICD-10-CM | POA: Diagnosis not present

## 2015-12-12 DIAGNOSIS — M5136 Other intervertebral disc degeneration, lumbar region: Secondary | ICD-10-CM | POA: Diagnosis not present

## 2015-12-12 DIAGNOSIS — M5030 Other cervical disc degeneration, unspecified cervical region: Secondary | ICD-10-CM | POA: Diagnosis not present

## 2015-12-22 DIAGNOSIS — M545 Low back pain: Secondary | ICD-10-CM | POA: Diagnosis not present

## 2015-12-22 DIAGNOSIS — M9903 Segmental and somatic dysfunction of lumbar region: Secondary | ICD-10-CM | POA: Diagnosis not present

## 2015-12-22 DIAGNOSIS — M5136 Other intervertebral disc degeneration, lumbar region: Secondary | ICD-10-CM | POA: Diagnosis not present

## 2015-12-22 DIAGNOSIS — M5030 Other cervical disc degeneration, unspecified cervical region: Secondary | ICD-10-CM | POA: Diagnosis not present

## 2015-12-22 DIAGNOSIS — M9901 Segmental and somatic dysfunction of cervical region: Secondary | ICD-10-CM | POA: Diagnosis not present

## 2015-12-22 DIAGNOSIS — M9902 Segmental and somatic dysfunction of thoracic region: Secondary | ICD-10-CM | POA: Diagnosis not present

## 2015-12-29 DIAGNOSIS — M9901 Segmental and somatic dysfunction of cervical region: Secondary | ICD-10-CM | POA: Diagnosis not present

## 2015-12-29 DIAGNOSIS — M9902 Segmental and somatic dysfunction of thoracic region: Secondary | ICD-10-CM | POA: Diagnosis not present

## 2015-12-29 DIAGNOSIS — M9903 Segmental and somatic dysfunction of lumbar region: Secondary | ICD-10-CM | POA: Diagnosis not present

## 2015-12-29 DIAGNOSIS — M5030 Other cervical disc degeneration, unspecified cervical region: Secondary | ICD-10-CM | POA: Diagnosis not present

## 2015-12-29 DIAGNOSIS — M5136 Other intervertebral disc degeneration, lumbar region: Secondary | ICD-10-CM | POA: Diagnosis not present

## 2016-01-03 ENCOUNTER — Other Ambulatory Visit: Payer: Self-pay | Admitting: Neurology

## 2016-01-03 NOTE — Telephone Encounter (Signed)
Rachel [name] 02/21/1951 called needing a refill on her Carbidopa Levadopa 100 mg.  She went from taking 4 a day to 5 a day. Her number is [phone]. She uses Product[facility]process development scientist. Thank you

## 2016-01-03 NOTE — Telephone Encounter (Signed)
Refill sent to [facility] as previously prescribed. Dr. Carles Collet can discuss increase with patient at next visit.

## 2016-01-05 DIAGNOSIS — M9903 Segmental and somatic dysfunction of lumbar region: Secondary | ICD-10-CM | POA: Diagnosis not present

## 2016-01-05 DIAGNOSIS — M5136 Other intervertebral disc degeneration, lumbar region: Secondary | ICD-10-CM | POA: Diagnosis not present

## 2016-01-05 DIAGNOSIS — M9902 Segmental and somatic dysfunction of thoracic region: Secondary | ICD-10-CM | POA: Diagnosis not present

## 2016-01-05 DIAGNOSIS — M9901 Segmental and somatic dysfunction of cervical region: Secondary | ICD-10-CM | POA: Diagnosis not present

## 2016-01-05 DIAGNOSIS — M5030 Other cervical disc degeneration, unspecified cervical region: Secondary | ICD-10-CM | POA: Diagnosis not present

## 2016-01-12 DIAGNOSIS — M5136 Other intervertebral disc degeneration, lumbar region: Secondary | ICD-10-CM | POA: Diagnosis not present

## 2016-01-12 DIAGNOSIS — M9903 Segmental and somatic dysfunction of lumbar region: Secondary | ICD-10-CM | POA: Diagnosis not present

## 2016-01-12 DIAGNOSIS — M9902 Segmental and somatic dysfunction of thoracic region: Secondary | ICD-10-CM | POA: Diagnosis not present

## 2016-01-12 DIAGNOSIS — M5030 Other cervical disc degeneration, unspecified cervical region: Secondary | ICD-10-CM | POA: Diagnosis not present

## 2016-01-12 DIAGNOSIS — M9901 Segmental and somatic dysfunction of cervical region: Secondary | ICD-10-CM | POA: Diagnosis not present

## 2016-01-19 DIAGNOSIS — M9902 Segmental and somatic dysfunction of thoracic region: Secondary | ICD-10-CM | POA: Diagnosis not present

## 2016-01-19 DIAGNOSIS — M5030 Other cervical disc degeneration, unspecified cervical region: Secondary | ICD-10-CM | POA: Diagnosis not present

## 2016-01-19 DIAGNOSIS — M9901 Segmental and somatic dysfunction of cervical region: Secondary | ICD-10-CM | POA: Diagnosis not present

## 2016-01-19 DIAGNOSIS — M5136 Other intervertebral disc degeneration, lumbar region: Secondary | ICD-10-CM | POA: Diagnosis not present

## 2016-01-19 DIAGNOSIS — M9903 Segmental and somatic dysfunction of lumbar region: Secondary | ICD-10-CM | POA: Diagnosis not present

## 2016-01-31 DIAGNOSIS — M5136 Other intervertebral disc degeneration, lumbar region: Secondary | ICD-10-CM | POA: Diagnosis not present

## 2016-01-31 DIAGNOSIS — M9901 Segmental and somatic dysfunction of cervical region: Secondary | ICD-10-CM | POA: Diagnosis not present

## 2016-01-31 DIAGNOSIS — M5030 Other cervical disc degeneration, unspecified cervical region: Secondary | ICD-10-CM | POA: Diagnosis not present

## 2016-01-31 DIAGNOSIS — M9903 Segmental and somatic dysfunction of lumbar region: Secondary | ICD-10-CM | POA: Diagnosis not present

## 2016-01-31 DIAGNOSIS — M9902 Segmental and somatic dysfunction of thoracic region: Secondary | ICD-10-CM | POA: Diagnosis not present

## 2016-02-08 ENCOUNTER — Encounter: Payer: Self-pay | Admitting: Neurology

## 2016-02-08 ENCOUNTER — Ambulatory Visit (INDEPENDENT_AMBULATORY_CARE_PROVIDER_SITE_OTHER): Payer: Medicare Other | Admitting: Neurology

## 2016-02-08 VITALS — BP 104/70 | HR 75 | Ht 66.0 in | Wt 223.0 lb

## 2016-02-08 DIAGNOSIS — G4700 Insomnia, unspecified: Secondary | ICD-10-CM

## 2016-02-08 DIAGNOSIS — G20 Parkinson's disease: Secondary | ICD-10-CM

## 2016-02-08 DIAGNOSIS — G249 Dystonia, unspecified: Secondary | ICD-10-CM

## 2016-02-08 DIAGNOSIS — G239 Degenerative disease of basal ganglia, unspecified: Secondary | ICD-10-CM | POA: Diagnosis not present

## 2016-02-08 DIAGNOSIS — G903 Multi-system degeneration of the autonomic nervous system: Secondary | ICD-10-CM

## 2016-02-08 MED ORDER — AMANTADINE HCL 100 MG PO CAPS: 100.0000 mg | ORAL_CAPSULE | Freq: Two times a day (BID) | ORAL | Status: DC

## 2016-02-08 MED ORDER — ROLLATOR MISC: 1.0000 | Freq: Every day | Status: DC

## 2016-02-08 NOTE — Progress Notes (Signed)
Subjective:   Rachel [name] was seen in consultation in the movement disorder clinic at the request of Marton Redwood, MD.  The evaluation is for tremor.  The patient is a 65 y.o. right handed female with a history of tremor.  The pt reports that she first noted shaking in the legs and arms bilaterally about 1 - 1.5 years ago.  It was intermittent.  She now notices tremor of the hands, legs and head.  She has better and worse days.  She notes that lying in the recliner helps.  She notes that walking and exercise makes it worse.  The L leg feels that it will stiffen up and cause the tremor.  There is a family hx of tremor in her mother but it was undiagnosed.    Affected by caffeine:  yes (quit drinking caffeine all together) Exacerbated by albuterol:  Yes Affected by alcohol:  unknown Affected by stress:  yes Affected by fatigue:  yes Spills soup if on spoon:  yes Spills glass of liquid if full:  yes Affects ADL's (tying shoes, brushing teeth, etc):  no Affects ability to put on makeup:  Yes  09/1813 update:  The patient is following up today regarding tremor.  She is accompanied by her spouse, who supplements the history.  I have not seen the patient since her first appointment in August, 2014.  At that point in time, she did not want any medication for the tremor.  She did have an MRI of the brain in August, 2014 that was normal.  I did review this.  Over the course of time, she has noted tremor in the L foot and hand.  She states that if she uses the hands, her L leg will stiffen and will eventually hurt.  Minimal neck pain.  Has back pain but thinks that is independent.  Her bladder/bowel work okay.  Tremor is much better in the hands, L more than R, but it is better now that she is taking herbs that her chiropractor gave her.  She is receiving kinesiology treatment.  She feels that her walking is "awkward" because of the foot.  She has trouble with arm swing.  She reports some shakiness with  handwriting (R hand dominant) but no micrographia.  She is sleeping well.  She has no vivid dreams.  No sleep walking/talking.  No diplopia.  Does have hx of scintillating scotomas that is sometimes associated with migraine.  Denies swallowing problems.  No speech changes per pt but husband states that sometimes pt inserts one word for another or has word finding trouble.  Has "no smell" but able to taste, but thinks that there is some loss because of not being able to taste food.  She admits that she has been researching various causes of her symptoms, including Parkinson's and dystonias.  She requests a DaT scan.  11/19/13 update:  The patient is following up today, accompanied by her husband who helps to supplement the history.  Since our last visit, the patient did undergo a DaT scan.  No films are available.  However, I did get the report.  There was asymmetric uptake, with no uptake in the right putamen and normal on the left.  Pt opted to hold on the MRIs ordered last visit.  We did start carbidopa/levodopa last visit and the patient states that it is about 30% beneficial for her symptoms.   03/21/14 update:  This patient is accompanied in the office by her spouse who supplements  the history.  Last visit, I increased the patients carbidopa/levodopa 25/100 to qid but she hasn't really need to do that.  She is overall doing much better.  I did send her for PT/OT and that has helped.   She has had some constipation and been on miralax for that.  She states that she had severe "brain fog" yesterday and thinks that it is related to the levodopa.  She was driving and was confused about where she was.  She has had some other minor episodes prior to that of feeling confused but nothing like that.  She states that her chiropracter gave her "brain medicine" (2 of them, one she calls fito-brain B12) and she states that medicine allowed her arm to swing. She sees her chiropractor daily now and is very pleased with her  services.  The levodopa did help so her leg no longer tremors.   She does think that she has had her thyroid checked this year and states that her synthroid was increased last visit but cannot remember when that was.    07/11/14 update:  The patient is following up today, accompanied by her husband who supplements the history.  The patient is currently on carbidopa/levodopa 25/100, 4 times per day (went back up from 3 times per day because of "jerking" and foot dystonia).  I reviewed records since her last visit.  She was in her wife for vacation in October when she called me.  She stated that she initially had a scratchy throat and then suddenly she was not able to taste.  This sounded viral to me, but I told her that nonetheless it was not related to [place], as symptoms do not come on that acutely.  I told her that she could follow up with a physician in [place].   She states that it took 6-8 weeks to go away, but her husband got the same thing.   She is not falling currently but thinks that her balance is really not good.  She cannot remember the last PT date.  She still has trouble with her memory.  That is her biggest c/o.  She is having word finding trouble.    12/02/14 update:  The patient is f/u today.  She is on carbidopa/levodopa 25/100 four times a day.  Her sister in law died a few weeks ago after complications from a fall.  This was unexpected.  She was only in her 54's.  She went for neuropsych testing since our last visit.  No evidence of dementia.  Evidence of adjustment disorder with mixed anxiety/depression and felt that she may benefit from brief trial of psychotherapy.  Pt states that she is doing well in that regard and does not think she needs the therapy right now and she did start some physical therapy since last visit, but she states that she did not end up completing it because something else came up, but feels that she has been doing well.  Her left foot only turns and when she is very  tired.  She has diplopia when looking left.  She has an appt with neuro-opth (Dr. Hassell Done) at the end of may.    04/13/15 update:  The patient is following up today regarding her MSA.  She is carbidopa/levodopa 25/100 25/100, 1 tablet 4 times per day.  Overall, the patient reports that she is doing well.  She has gotten a new job at the [employer] since our last visit.  She is working at  the corporate office as a receptionist.  She is having to multitask which is good for her.  Since her last visit, she did see Dr. Sanda Klein at [facility] in regards to her diplopia.  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  He said that the diplopia is not a component directly of MSA, but rather perhaps a loss of a compensatory ability secondary to [place].  He did not know why the symptoms were just with left gaze as the same dissociation occurred in both right and left gaze.  The patient was not interested in surgery, but he did recommend prisms and the patient was going to follow-up with Dr. Ellie Lunch at [facility] ophthalmology to discuss that further.  She has a temporary prism now and she can drive again now with it and she loves that.  She asks me about a permanent license plate (handicap) instead of the placard as she forgets to hang the placard sometimes.  She has had no falls but she has to be very careful.  She runs into walls a lot.  She is doing okay with swallowing (nothing worse than usual per pt).  She is walking and riding her bike for exercise.  If she doesn't her L leg will stiffen up.  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  She is on carbidopa/levodopa 25/100, 1 tablet 4 times per day.  She has had more tremor of the L leg and "it just drives me crazy."  She has had more constipation and miralax became less effective.  She has not had any falls since last visit.  No hallucinations.  No lightheadedness or near syncope.   Physical therapy notes indicate that the patient canceled multiple visits and ultimately was discharged because of that.  She states that at the time she felt that she didn't need it but now she has gotten weaker.  She has backed down on the supplements because she was getting sick; she is just taking Calium, D3 and still taking the "dopatone."  She is also very tired because she is not sleeping well.  10/12/15 update:  Pt f/u today re: MSA.  She is on carbidopa/levodopa 25/100 four times a day (9am/2:30pm/7pm/11pm) and I did recommend that she stop her dopatone supplement.  She is still on this but might take it every other day.  She is taking other supplements as well ("glutathione recycler").  She does state that her chiropractor gave her a different supplement and told her to d/c dopatone and told her to take [facility] glutathione and "it made a great difference."  She is taking it a bit after the meal times.  She was given the rancho recipe for constipation and I recommended that she start melatonin for insomnia.  She didn't try the rancho recipe because bran had gluten and gluten gives her headaches so she mixes prunes juice with miralax and uses a laxative tea.  She reports today that she has not had any falls.  She has not had any hallucinations or near syncope.  She was sent for PT at last visit but then refused therapy when contacted by them.  She is not exercising regularly but is occasionally riding her bike.  She works 4.5 daily.  02/08/16 update:  Pt f/u today re: MSA.  This patient is accompanied in the office by her spouse who supplements the history.  She was on carbidopa/levodopa 25/100 four times a day  last visit.  She called after our last visit and stated that she felt somewhat more stiff and I told her she could try an extra half tablet as needed.  She states that she has increased it to one tablet 5 times a day.  Feels that she has been weaker.  L foot been moving more and "drives me crazy."   She  has been using the indoor bike.  She denies any falls but she is having near falls.  Asks for RX for walker.    She denies any lightheadedness or near syncope.  No hallucinations.  Mood has been good per pt and husband.    Using melatonin for insomnia.   Current/Previously tried tremor medications: tried propranolol on one occasion but seemed to make her sick and she did not want to take the next pill  Current medications that may exacerbate tremor:  proair  Outside reports reviewed: historical medical records.  Allergies  Allergen Reactions  . Alendronate Other (See Comments)    headache  . Symbicort [Budesonide-Formoterol Fumarate] Other (See Comments)    Shakes and muscle weakness    Current Outpatient Prescriptions on File Prior to Visit  Medication Sig Dispense Refill  . CALCIUM LACTATE PO Take 6 tablets by mouth daily.    . carbidopa-levodopa (SINEMET IR) 25-100 MG tablet TAKE ONE TABLET BY MOUTH 4 TIMES DAILY (Patient taking differently: TAKE ONE TABLET BY MOUTH 5 TIMES DAILY) 120 tablet 5  . Cholecalciferol (VITAMIN D-3 PO) Take 3 tablets by mouth daily.    . citalopram (CELEXA) 20 MG tablet Take 20 mg by mouth daily.     Marland Kitchen levothyroxine (SYNTHROID, LEVOTHROID) 88 MCG tablet Take 88 mcg by mouth at bedtime.    . Multiple Vitamin (MULTIVITAMIN WITH MINERALS) TABS tablet Take 2 tablets by mouth daily.    Marland Kitchen omeprazole (PRILOSEC OTC) 20 MG tablet Take 20 mg by mouth daily before breakfast.    . Probiotic Product (PROBIOTIC DAILY PO) Take 1 tablet by mouth daily.    . VOLTAREN 1 % GEL      No current facility-administered medications on file prior to visit.    Past Medical History  Diagnosis Date  . Hypothyroidism   . Asthma   . Nephrolithiasis     hx of kidney stones, last 6 months ago  . Arthritis   . Blood clot in vein  age 63    left leg  . Anemia   . Multiple pregnancy loss, not currently pregnant     multiple miscarriages-pt.unsure of #  . GERD (gastroesophageal  reflux disease)   . Osteopenia   . Hypertension     pt denies, in physician record  . Hyperlipidemia   . Tendinitis     left foot  . Multiple system atrophy ([place]) 2015    Past Surgical History  Procedure Laterality Date  . Colonscopy  2011  . Joint replacement  05-2007    right knee replacment/revision  . Cystoscopy  2012  . Total knee revision  06/03/2012    Procedure: TOTAL KNEE REVISION;  Surgeon: Gearlean Alf, MD;  Location: [facility];  Service: Orthopedics;  Laterality: Left;  Revision of a Left Uni Knee to a Total Knee Arthroplasty  . Breast surgery      rt. lumpectomy, benign  . Replacement total knee Left 06/05/12  . Kidney stone surgery      Social History   Social History  . Marital Status: Married    Spouse Name:  N/A  . Number of Children: N/A  . Years of Education: N/A   Occupational History  . retired     Research scientist (physical sciences)   Social History Main Topics  . Smoking status: Never Smoker   . Smokeless tobacco: Never Used  . Alcohol Use: No  . Drug Use: No  . Sexual Activity: No   Other Topics Concern  . Not on file   Social History Narrative    Family Status  Relation Status Death Age  . Mother Deceased     fall, death during surgery  . Father Deceased     ? heart  . Brother Alive     healthy  . Sister Alive     2, asthma, heart problem  . Child Alive     adopted, healthy    Review of Systems A complete 10 system ROS was obtained and was negative apart from what is mentioned.   Objective:   VITALS:   Filed Vitals:   02/08/16 0957  BP: 104/70  Pulse: 75  Height: 5\' 6"  (1.676 m)  Weight: 223 lb (101.152 kg)   Wt Readings from Last 3 Encounters:  02/08/16 223 lb (101.152 kg)  08/14/15 218 lb (98.884 kg)  04/13/15 213 lb (96.616 kg)     Gen:  Appears stated age and in NAD. HEENT:  Normocephalic, atraumatic. The mucous membranes are moist. The superficial temporal arteries are without ropiness or tenderness. Cardiovascular: Regular rate  and rhythm. Lungs: Clear to auscultation bilaterally. Neck: There are no carotid bruits noted bilaterally.  NEUROLOGICAL:  Orientation:  The patient is alert and oriented x 3.  Recent and remote memory are intact.  Attention span and concentration are normal.  Able to name objects and repeat without trouble.  Fund of knowledge is appropriate Cranial nerves: There is good facial symmetry.  Extraocular muscles are intact and visual fields are full to confrontational testing. Speech is fluent and clear. Soft palate rises symmetrically and there is no tongue deviation. Hearing is intact to conversational tone. Tone: Tone is good throughout.  There is no spasticity or rigidity. Coordination:  The patient has good rapid alternating movements today Motor: Strength is 5/5 in the bilateral upper and lower extremities.  Shoulder shrug is equal bilaterally.  There is no pronator drift.  There are no fasciculations noted. Gait and Station: The patient is able to ambulate and has good arm swing today.  MOVEMENT EXAM: Tremor:   There is no tremor.  There is rare dyskinesia of the L foot  Labs:  No results found for: TSH      Assessment/Plan:   1.  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  She and I talked about the diagnosis.  She will continue on carbidopa/levodopa 25/100 5 times a day.  She looks good on this medication.    -Wanted her to restart PT but she could not afford.  Talked to her about this again today    -RX for walker (rollator)  -talked to her about rock steady boxing and gave information although she doesn't think that she is interested  -talked to her about amantadine for the dyskinesia and will start bid.  Risks, benefits, side effects and alternative therapies were discussed.  The opportunity to ask questions was given and they were answered to the best of my ability.  The patient expressed understanding and willingness to follow the outlined treatment  protocols.    2.  Cognitive impairment  -As above, she had neuropsych  testing done in January, 2016 and there was no evidence of dementia.  There was some evidence of mixed anxiety/depression and a brief trial of psychotherapy was recommended, but the patient felt that she really did not need that.  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  I think that this has helped her.  3.  Diplopia  -Pt saw Dr. Sanda Klein at [facility] in regards to her diplopia.  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  He said that the diplopia is not a component directly of MSA, but rather perhaps a loss of a compensatory ability secondary to [place].  She has a temporary prism now and it has really helped.  4.  Constipation  -using a tea, prune juice and miralax  5.  Insomnia  -using melatonin, 3 mg and states that it is working  6.  Follow up is anticipated in the next 3-4 months, sooner should new neurologic issues arise.  Much greater than 50% of this visit was spent in counseling with the patient and the family.  Total face to face time:  25 min

## 2016-02-08 NOTE — Patient Instructions (Addendum)
1. Start Amantadine 100 - 1 tablet twice daily.

## 2016-02-12 DIAGNOSIS — M5030 Other cervical disc degeneration, unspecified cervical region: Secondary | ICD-10-CM | POA: Diagnosis not present

## 2016-02-12 DIAGNOSIS — M9901 Segmental and somatic dysfunction of cervical region: Secondary | ICD-10-CM | POA: Diagnosis not present

## 2016-02-12 DIAGNOSIS — M9903 Segmental and somatic dysfunction of lumbar region: Secondary | ICD-10-CM | POA: Diagnosis not present

## 2016-02-12 DIAGNOSIS — M9902 Segmental and somatic dysfunction of thoracic region: Secondary | ICD-10-CM | POA: Diagnosis not present

## 2016-02-12 DIAGNOSIS — M5136 Other intervertebral disc degeneration, lumbar region: Secondary | ICD-10-CM | POA: Diagnosis not present

## 2016-02-16 ENCOUNTER — Telehealth: Payer: Self-pay | Admitting: Certified Nurse Midwife

## 2016-02-16 NOTE — Telephone Encounter (Deleted)
Patient called requesting to cancel her annual exam with D.Leonard. When asked if she would like to reschedule she stated the appointment was "not needed at this time". Patient did not reschedule.   Route to provider for review.

## 2016-02-16 NOTE — Telephone Encounter (Signed)
error 

## 2016-02-19 DIAGNOSIS — M9903 Segmental and somatic dysfunction of lumbar region: Secondary | ICD-10-CM | POA: Diagnosis not present

## 2016-02-19 DIAGNOSIS — M9901 Segmental and somatic dysfunction of cervical region: Secondary | ICD-10-CM | POA: Diagnosis not present

## 2016-02-19 DIAGNOSIS — M9902 Segmental and somatic dysfunction of thoracic region: Secondary | ICD-10-CM | POA: Diagnosis not present

## 2016-02-19 DIAGNOSIS — M5030 Other cervical disc degeneration, unspecified cervical region: Secondary | ICD-10-CM | POA: Diagnosis not present

## 2016-02-19 DIAGNOSIS — M5136 Other intervertebral disc degeneration, lumbar region: Secondary | ICD-10-CM | POA: Diagnosis not present

## 2016-02-20 ENCOUNTER — Telehealth: Payer: Self-pay | Admitting: Neurology

## 2016-02-20 NOTE — Telephone Encounter (Signed)
LMOM making patient aware that if she is talking about rancho recipe - couple tablespoons, if she is talking about miralax - a cap full a day. If that doesn't answer her question she was asked to call back.

## 2016-02-21 ENCOUNTER — Ambulatory Visit: Payer: Medicare Other | Admitting: Certified Nurse Midwife

## 2016-02-26 DIAGNOSIS — M5136 Other intervertebral disc degeneration, lumbar region: Secondary | ICD-10-CM | POA: Diagnosis not present

## 2016-02-26 DIAGNOSIS — M9903 Segmental and somatic dysfunction of lumbar region: Secondary | ICD-10-CM | POA: Diagnosis not present

## 2016-02-26 DIAGNOSIS — M9902 Segmental and somatic dysfunction of thoracic region: Secondary | ICD-10-CM | POA: Diagnosis not present

## 2016-02-26 DIAGNOSIS — M9901 Segmental and somatic dysfunction of cervical region: Secondary | ICD-10-CM | POA: Diagnosis not present

## 2016-02-26 DIAGNOSIS — M5030 Other cervical disc degeneration, unspecified cervical region: Secondary | ICD-10-CM | POA: Diagnosis not present

## 2016-03-04 DIAGNOSIS — M9901 Segmental and somatic dysfunction of cervical region: Secondary | ICD-10-CM | POA: Diagnosis not present

## 2016-03-04 DIAGNOSIS — M5136 Other intervertebral disc degeneration, lumbar region: Secondary | ICD-10-CM | POA: Diagnosis not present

## 2016-03-04 DIAGNOSIS — M9902 Segmental and somatic dysfunction of thoracic region: Secondary | ICD-10-CM | POA: Diagnosis not present

## 2016-03-04 DIAGNOSIS — M5030 Other cervical disc degeneration, unspecified cervical region: Secondary | ICD-10-CM | POA: Diagnosis not present

## 2016-03-04 DIAGNOSIS — M9903 Segmental and somatic dysfunction of lumbar region: Secondary | ICD-10-CM | POA: Diagnosis not present

## 2016-03-06 ENCOUNTER — Telehealth: Payer: Self-pay | Admitting: Neurology

## 2016-03-06 NOTE — Telephone Encounter (Signed)
Spoke with patient who states she had been having about one BM a week but had more recent trouble moving bowels. She has tried stool softeners, milk of magnesia, miralax, the rancho recipe, and is still having trouble. It has been 3 weeks since her last BM. I advised her to call PCP to r/o obstruction or any other problem and may need to see GI. She agreed with this plan.

## 2016-03-06 NOTE — Telephone Encounter (Signed)
Patient needs a call before 12:30 at [phone]

## 2016-03-06 NOTE — Telephone Encounter (Signed)
No narcotics. No new medicines. Drinking water.

## 2016-03-06 NOTE — Telephone Encounter (Signed)
PT called and said she has not had a bowel movement in 3 weeks/[name] CB#[phone]

## 2016-03-06 NOTE — Telephone Encounter (Signed)
Any new meds?  Any narcotics?  Drinking water?

## 2016-03-07 DIAGNOSIS — G903 Multi-system degeneration of the autonomic nervous system: Secondary | ICD-10-CM | POA: Diagnosis not present

## 2016-03-07 DIAGNOSIS — K5909 Other constipation: Secondary | ICD-10-CM | POA: Diagnosis not present

## 2016-03-07 DIAGNOSIS — Z6836 Body mass index (BMI) 36.0-36.9, adult: Secondary | ICD-10-CM | POA: Diagnosis not present

## 2016-03-07 DIAGNOSIS — I10 Essential (primary) hypertension: Secondary | ICD-10-CM | POA: Diagnosis not present

## 2016-03-07 NOTE — Telephone Encounter (Signed)
As long as no fever or chills, she can try the bowel purge with dulcolax but if not better needs to get to PCP

## 2016-03-07 NOTE — Telephone Encounter (Signed)
Called patient and she is at her PCP's office now. She will call me back after and let me know if she needs the instructions.

## 2016-03-11 ENCOUNTER — Telehealth: Payer: Self-pay | Admitting: Neurology

## 2016-03-11 DIAGNOSIS — M9901 Segmental and somatic dysfunction of cervical region: Secondary | ICD-10-CM | POA: Diagnosis not present

## 2016-03-11 DIAGNOSIS — M9903 Segmental and somatic dysfunction of lumbar region: Secondary | ICD-10-CM | POA: Diagnosis not present

## 2016-03-11 DIAGNOSIS — M5030 Other cervical disc degeneration, unspecified cervical region: Secondary | ICD-10-CM | POA: Diagnosis not present

## 2016-03-11 DIAGNOSIS — M5136 Other intervertebral disc degeneration, lumbar region: Secondary | ICD-10-CM | POA: Diagnosis not present

## 2016-03-11 DIAGNOSIS — M9902 Segmental and somatic dysfunction of thoracic region: Secondary | ICD-10-CM | POA: Diagnosis not present

## 2016-03-11 NOTE — Telephone Encounter (Signed)
Is she sure it is from this?  Unusual side effect.  If she is not diabetic, give her medrol dose pack and see if that breaks the headache cycle.  Tell her I want her to drink 1/2 gal of water (60+ oz) water per day

## 2016-03-11 NOTE — Telephone Encounter (Signed)
Patient called and thinks she is having a reaction to the new medication she states that she does not know the name of the medication. She states that she is having really bad headaches and really low blood pressure. Please cal [phone]

## 2016-03-11 NOTE — Telephone Encounter (Signed)
Patient states that she has been having terrible headaches since starting the amantidine.  She says the medication is really working well except for this side effect.  Please advise.

## 2016-03-12 ENCOUNTER — Telehealth: Payer: Self-pay | Admitting: Neurology

## 2016-03-12 MED ORDER — METHYLPREDNISOLONE 4 MG PO TBPK
ORAL_TABLET | ORAL | 0 refills | Status: DC
Start: 2016-03-12 — End: 2016-03-21

## 2016-03-12 NOTE — Telephone Encounter (Signed)
Rachel [name] 09/14/1950. She called back and would like you to call in prednisone to the [facility] on [address]. Her # is [phone] Thank you

## 2016-03-12 NOTE — Addendum Note (Signed)
Addended byAnnamaria Helling on: 03/12/2016 09:28 AM   Modules accepted: Orders

## 2016-03-12 NOTE — Telephone Encounter (Signed)
That is where medication was sent.

## 2016-03-12 NOTE — Telephone Encounter (Signed)
Patient made aware. Medrol sent to pharmacy. She requested appt with Dr. Carles Collet. Appt made.

## 2016-03-19 NOTE — Progress Notes (Signed)
Subjective:   Rachel [name] was seen in consultation in the movement disorder clinic at the request of Marton Redwood, MD.  The evaluation is for tremor.  The patient is a 65 y.o. right handed female with a history of tremor.  The pt reports that she first noted shaking in the legs and arms bilaterally about 1 - 1.5 years ago.  It was intermittent.  She now notices tremor of the hands, legs and head.  She has better and worse days.  She notes that lying in the recliner helps.  She notes that walking and exercise makes it worse.  The L leg feels that it will stiffen up and cause the tremor.  There is a family hx of tremor in her mother but it was undiagnosed.    Affected by caffeine:  yes (quit drinking caffeine all together) Exacerbated by albuterol:  Yes Affected by alcohol:  unknown Affected by stress:  yes Affected by fatigue:  yes Spills soup if on spoon:  yes Spills glass of liquid if full:  yes Affects ADL's (tying shoes, brushing teeth, etc):  no Affects ability to put on makeup:  Yes  09/1813 update:  The patient is following up today regarding tremor.  She is accompanied by her spouse, who supplements the history.  I have not seen the patient since her first appointment in August, 2014.  At that point in time, she did not want any medication for the tremor.  She did have an MRI of the brain in August, 2014 that was normal.  I did review this.  Over the course of time, she has noted tremor in the L foot and hand.  She states that if she uses the hands, her L leg will stiffen and will eventually hurt.  Minimal neck pain.  Has back pain but thinks that is independent.  Her bladder/bowel work okay.  Tremor is much better in the hands, L more than R, but it is better now that she is taking herbs that her chiropractor gave her.  She is receiving kinesiology treatment.  She feels that her walking is "awkward" because of the foot.  She has trouble with arm swing.  She reports some shakiness with  handwriting (R hand dominant) but no micrographia.  She is sleeping well.  She has no vivid dreams.  No sleep walking/talking.  No diplopia.  Does have hx of scintillating scotomas that is sometimes associated with migraine.  Denies swallowing problems.  No speech changes per pt but husband states that sometimes pt inserts one word for another or has word finding trouble.  Has "no smell" but able to taste, but thinks that there is some loss because of not being able to taste food.  She admits that she has been researching various causes of her symptoms, including Parkinson's and dystonias.  She requests a DaT scan.  11/19/13 update:  The patient is following up today, accompanied by her husband who helps to supplement the history.  Since our last visit, the patient did undergo a DaT scan.  No films are available.  However, I did get the report.  There was asymmetric uptake, with no uptake in the right putamen and normal on the left.  Pt opted to hold on the MRIs ordered last visit.  We did start carbidopa/levodopa last visit and the patient states that it is about 30% beneficial for her symptoms.   03/21/14 update:  This patient is accompanied in the office by her spouse who supplements  the history.  Last visit, I increased the patients carbidopa/levodopa 25/100 to qid but she hasn't really need to do that.  She is overall doing much better.  I did send her for PT/OT and that has helped.   She has had some constipation and been on miralax for that.  She states that she had severe "brain fog" yesterday and thinks that it is related to the levodopa.  She was driving and was confused about where she was.  She has had some other minor episodes prior to that of feeling confused but nothing like that.  She states that her chiropracter gave her "brain medicine" (2 of them, one she calls fito-brain B12) and she states that medicine allowed her arm to swing. She sees her chiropractor daily now and is very pleased with her  services.  The levodopa did help so her leg no longer tremors.   She does think that she has had her thyroid checked this year and states that her synthroid was increased last visit but cannot remember when that was.    07/11/14 update:  The patient is following up today, accompanied by her husband who supplements the history.  The patient is currently on carbidopa/levodopa 25/100, 4 times per day (went back up from 3 times per day because of "jerking" and foot dystonia).  I reviewed records since her last visit.  She was in her wife for vacation in October when she called me.  She stated that she initially had a scratchy throat and then suddenly she was not able to taste.  This sounded viral to me, but I told her that nonetheless it was not related to [place], as symptoms do not come on that acutely.  I told her that she could follow up with a physician in [place].   She states that it took 6-8 weeks to go away, but her husband got the same thing.   She is not falling currently but thinks that her balance is really not good.  She cannot remember the last PT date.  She still has trouble with her memory.  That is her biggest c/o.  She is having word finding trouble.    12/02/14 update:  The patient is f/u today.  She is on carbidopa/levodopa 25/100 four times a day.  Her sister in law died a few weeks ago after complications from a fall.  This was unexpected.  She was only in her 54's.  She went for neuropsych testing since our last visit.  No evidence of dementia.  Evidence of adjustment disorder with mixed anxiety/depression and felt that she may benefit from brief trial of psychotherapy.  Pt states that she is doing well in that regard and does not think she needs the therapy right now and she did start some physical therapy since last visit, but she states that she did not end up completing it because something else came up, but feels that she has been doing well.  Her left foot only turns and when she is very  tired.  She has diplopia when looking left.  She has an appt with neuro-opth (Dr. Hassell Done) at the end of may.    04/13/15 update:  The patient is following up today regarding her MSA.  She is carbidopa/levodopa 25/100 25/100, 1 tablet 4 times per day.  Overall, the patient reports that she is doing well.  She has gotten a new job at the [employer] since our last visit.  She is working at  the corporate office as a receptionist.  She is having to multitask which is good for her.  Since her last visit, she did see Dr. Sanda Klein at [facility] in regards to her diplopia.  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  He said that the diplopia is not a component directly of MSA, but rather perhaps a loss of a compensatory ability secondary to [place].  He did not know why the symptoms were just with left gaze as the same dissociation occurred in both right and left gaze.  The patient was not interested in surgery, but he did recommend prisms and the patient was going to follow-up with Dr. Ellie Lunch at [facility] ophthalmology to discuss that further.  She has a temporary prism now and she can drive again now with it and she loves that.  She asks me about a permanent license plate (handicap) instead of the placard as she forgets to hang the placard sometimes.  She has had no falls but she has to be very careful.  She runs into walls a lot.  She is doing okay with swallowing (nothing worse than usual per pt).  She is walking and riding her bike for exercise.  If she doesn't her L leg will stiffen up.  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  She is on carbidopa/levodopa 25/100, 1 tablet 4 times per day.  She has had more tremor of the L leg and "it just drives me crazy."  She has had more constipation and miralax became less effective.  She has not had any falls since last visit.  No hallucinations.  No lightheadedness or near syncope.   Physical therapy notes indicate that the patient canceled multiple visits and ultimately was discharged because of that.  She states that at the time she felt that she didn't need it but now she has gotten weaker.  She has backed down on the supplements because she was getting sick; she is just taking Calium, D3 and still taking the "dopatone."  She is also very tired because she is not sleeping well.  10/12/15 update:  Pt f/u today re: MSA.  She is on carbidopa/levodopa 25/100 four times a day (9am/2:30pm/7pm/11pm) and I did recommend that she stop her dopatone supplement.  She is still on this but might take it every other day.  She is taking other supplements as well ("glutathione recycler").  She does state that her chiropractor gave her a different supplement and told her to d/c dopatone and told her to take [facility] glutathione and "it made a great difference."  She is taking it a bit after the meal times.  She was given the rancho recipe for constipation and I recommended that she start melatonin for insomnia.  She didn't try the rancho recipe because bran had gluten and gluten gives her headaches so she mixes prunes juice with miralax and uses a laxative tea.  She reports today that she has not had any falls.  She has not had any hallucinations or near syncope.  She was sent for PT at last visit but then refused therapy when contacted by them.  She is not exercising regularly but is occasionally riding her bike.  She works 4.5 daily.  02/08/16 update:  Pt f/u today re: MSA.  This patient is accompanied in the office by her spouse who supplements the history.  She was on carbidopa/levodopa 25/100 four times a day  last visit.  She called after our last visit and stated that she felt somewhat more stiff and I told her she could try an extra half tablet as needed.  She states that she has increased it to one tablet 5 times a day.  Feels that she has been weaker.  L foot been moving more and "drives me crazy."   She  has been using the indoor bike.  She denies any falls but she is having near falls.  Asks for RX for walker.    She denies any lightheadedness or near syncope.  No hallucinations.  Mood has been good per pt and husband.    Using melatonin for insomnia.  03/21/16 update:  Patient is seen today.  She remains on carbidopa/levodopa 25/100, 1 tablet 5 times per day.  Last visit, I started her on amantadine, 100 mg twice a day for dyskinesia.  She states that it helped the dyskinesia, but she thought it caused headache.  This would be an unusual side effect, but ended up calling her in a Medrol Dosepak.  She states that never even had to fill that RX because the headaches went away and she does think that it was allergy related.  She came today because her muscles are painful and hurting.  All month she has been struggling to even get off the couch to walk.  Her muscles are burning when they walk but not when she sits.  She has not had any falls since last visit. Last labs were 6-8 months ago.  I did give her a prescription for a Rollator a few weeks ago and she states that she has not gotten that yet.  She has not had any hallucinations.  No lightheadedness or near syncope.  Is having issues with constipation.  Tried rancho recipe.  On linzess.  4 colace per day without relief.  Even had n/v because of it.  Having trouble getting eyeliner on due to tremor.   Current/Previously tried tremor medications: tried propranolol on one occasion but seemed to make her sick and she did not want to take the next pill  Current medications that may exacerbate tremor:  proair  Outside reports reviewed: historical medical records.  Allergies  Allergen Reactions  . Alendronate Other (See Comments)    headache  . Symbicort [Budesonide-Formoterol Fumarate] Other (See Comments)    Shakes and muscle weakness    Current Outpatient Prescriptions on File Prior to Visit  Medication Sig Dispense Refill  . amantadine  (SYMMETREL) 100 MG capsule Take 1 capsule (100 mg total) by mouth 2 (two) times daily. 60 capsule 5  . CALCIUM LACTATE PO Take 6 tablets by mouth daily.    . carbidopa-levodopa (SINEMET IR) 25-100 MG tablet TAKE ONE TABLET BY MOUTH 4 TIMES DAILY (Patient taking differently: TAKE ONE TABLET BY MOUTH 5 TIMES DAILY) 120 tablet 5  . Cholecalciferol (VITAMIN D-3 PO) Take 3 tablets by mouth daily.    . citalopram (CELEXA) 20 MG tablet Take 20 mg by mouth daily.     Marland Kitchen levothyroxine (SYNTHROID, LEVOTHROID) 88 MCG tablet Take 88 mcg by mouth at bedtime.    . Multiple Vitamin (MULTIVITAMIN WITH MINERALS) TABS tablet Take 2 tablets by mouth daily.    Marland Kitchen omeprazole (PRILOSEC OTC) 20 MG tablet Take 20 mg by mouth daily before breakfast.    . VOLTAREN 1 % GEL      No current facility-administered medications on file prior to visit.     Past Medical  History:  Diagnosis Date  . Anemia   . Arthritis   . Asthma   . Blood clot in vein  age 31   left leg  . GERD (gastroesophageal reflux disease)   . Hyperlipidemia   . Hypertension    pt denies, in physician record  . Hypothyroidism   . Multiple pregnancy loss, not currently pregnant    multiple miscarriages-pt.unsure of #  . Multiple system atrophy ([place]) 2015  . Nephrolithiasis    hx of kidney stones, last 6 months ago  . Osteopenia   . Tendinitis    left foot    Past Surgical History:  Procedure Laterality Date  . BREAST SURGERY     rt. lumpectomy, benign  . colonscopy  2011  . cystoscopy  2012  . JOINT REPLACEMENT  05-2007   right knee replacment/revision  . KIDNEY STONE SURGERY    . REPLACEMENT TOTAL KNEE Left 06/05/12  . TOTAL KNEE REVISION  06/03/2012   Procedure: TOTAL KNEE REVISION;  Surgeon: Gearlean Alf, MD;  Location: [facility];  Service: Orthopedics;  Laterality: Left;  Revision of a Left Uni Knee to a Total Knee Arthroplasty    Social History   Social History  . Marital status: Married    Spouse name: N/A  . Number of  children: N/A  . Years of education: N/A   Occupational History  . retired [employer]   Social History Main Topics  . Smoking status: Never Smoker  . Smokeless tobacco: Never Used  . Alcohol use No  . Drug use: No  . Sexual activity: No   Other Topics Concern  . Not on file   Social History Narrative  . No narrative on file    Family Status  Relation Status  . Mother Deceased   fall, death during surgery  . Father Deceased   ? heart  . Brother Alive   healthy  . Sister Alive   2, asthma, heart problem  . Child Alive   adopted, healthy    Review of Systems A complete 10 system ROS was obtained and was negative apart from what is mentioned.   Objective:   VITALS:   Vitals:   03/21/16 0834  BP: 110/80  BP Location: Left Arm  Patient Position: Sitting  Cuff Size: Normal  Pulse: 76  Weight: 216 lb (98 kg)  Height: 5\' 6"  (1.676 m)   Wt Readings from Last 3 Encounters:  03/21/16 216 lb (98 kg)  02/08/16 223 lb (101.2 kg)  08/14/15 218 lb (98.9 kg)     Gen:  Appears stated age and in NAD. HEENT:  Normocephalic, atraumatic. The mucous membranes are moist. The superficial temporal arteries are without ropiness or tenderness. Cardiovascular: Regular rate and rhythm. Lungs: Clear to auscultation bilaterally. Neck: There are no carotid bruits noted bilaterally.  NEUROLOGICAL:  Orientation:  The patient is alert and oriented x 3.   Cranial nerves: There is good facial symmetry.  Extraocular muscles are intact and visual fields are full to confrontational testing. Speech is fluent and clear. Soft palate rises symmetrically and there is no tongue deviation. Hearing is intact to conversational tone. Tone: Tone is good throughout.  There is no spasticity or rigidity. Coordination:  The patient has good rapid alternating movements today Motor: Strength is 5/5 in the bilateral upper and lower extremities.  Shoulder shrug is equal bilaterally.   There is no pronator drift.  There are no fasciculations noted. Gait and  Station: The patient is able to ambulate and has good arm swing today.  MOVEMENT EXAM: Tremor:   There is no tremor.  There is no dyskinesia today  Labs:  No results found for: TSH      Assessment/Plan:   1.  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  She and I talked about the diagnosis.  She will continue on carbidopa/levodopa 25/100 5 times a day.  She looks good on this medication.    -Amantadine 100 mg bid has helped dyskinesia  -talked about medical makeup application via tattoo parlor due to difficulty with application because of tremor.  2.  Headache  -this resolved even before taking medrol and is likely allergy related.    2.  Cognitive impairment  -As above, she had neuropsych testing done in January, 2016 and there was no evidence of dementia.  There was some evidence of mixed anxiety/depression and a brief trial of psychotherapy was recommended, but the patient felt that she really did not need that.  Feels that she still is having issues but doesn't wish to pursue counseling.    3.  Diplopia  -Pt saw Dr. Sanda Klein at [facility] in regards to her diplopia.  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  He said that the diplopia is not a component directly of MSA, but rather perhaps a loss of a compensatory ability secondary to [place].  She has a temporary prism now and it has really helped.  4.  Constipation  -using a tea, prune juice, colace, rancho recipe, miralax and still is constipated.  Gave her bowel purge recipe to do when she is at home one weekend.  This consists of :  Taking 4 dulcolax tablets. Wait 1 hour.  She can  then drink 6-8 capfuls of Miralax mixed in an adequate amount of water/juice/gatorade over the next 2-3 hours.  She can then resume the rancho recipe and regular colace use  5.  Insomnia  -using melatonin, 3 mg and  states that it is working  6.  Paresthesias and muscle pain, diffuse  -samples of lyrica 50 mg bid to try.  Risks, benefits, side effects and alternative therapies were discussed.  The opportunity to ask questions was given and they were answered to the best of my ability.  The patient expressed understanding and willingness to follow the outlined treatment protocols.  7.  Follow up is anticipated in the next 3-4 months, sooner should new neurologic issues arise.  Much greater than 50% of this visit was spent in counseling with the patient .  Total face to face time:  25 min

## 2016-03-20 DIAGNOSIS — M5136 Other intervertebral disc degeneration, lumbar region: Secondary | ICD-10-CM | POA: Diagnosis not present

## 2016-03-20 DIAGNOSIS — M9901 Segmental and somatic dysfunction of cervical region: Secondary | ICD-10-CM | POA: Diagnosis not present

## 2016-03-20 DIAGNOSIS — M9903 Segmental and somatic dysfunction of lumbar region: Secondary | ICD-10-CM | POA: Diagnosis not present

## 2016-03-20 DIAGNOSIS — M5030 Other cervical disc degeneration, unspecified cervical region: Secondary | ICD-10-CM | POA: Diagnosis not present

## 2016-03-20 DIAGNOSIS — M9902 Segmental and somatic dysfunction of thoracic region: Secondary | ICD-10-CM | POA: Diagnosis not present

## 2016-03-21 ENCOUNTER — Ambulatory Visit (INDEPENDENT_AMBULATORY_CARE_PROVIDER_SITE_OTHER): Payer: Medicare Other | Admitting: Neurology

## 2016-03-21 ENCOUNTER — Telehealth: Payer: Self-pay | Admitting: Neurology

## 2016-03-21 ENCOUNTER — Encounter: Payer: Self-pay | Admitting: Neurology

## 2016-03-21 VITALS — BP 110/80 | HR 76 | Ht 66.0 in | Wt 216.0 lb

## 2016-03-21 DIAGNOSIS — R202 Paresthesia of skin: Secondary | ICD-10-CM

## 2016-03-21 DIAGNOSIS — G249 Dystonia, unspecified: Secondary | ICD-10-CM | POA: Diagnosis not present

## 2016-03-21 DIAGNOSIS — K5901 Slow transit constipation: Secondary | ICD-10-CM | POA: Diagnosis not present

## 2016-03-21 DIAGNOSIS — G239 Degenerative disease of basal ganglia, unspecified: Secondary | ICD-10-CM | POA: Diagnosis not present

## 2016-03-21 DIAGNOSIS — G20 Parkinson's disease: Secondary | ICD-10-CM

## 2016-03-21 DIAGNOSIS — G903 Multi-system degeneration of the autonomic nervous system: Secondary | ICD-10-CM

## 2016-03-21 MED ORDER — PREGABALIN 50 MG PO CAPS: 50.0000 mg | ORAL_CAPSULE | Freq: Three times a day (TID) | ORAL | 0 refills | Status: DC

## 2016-03-21 NOTE — Telephone Encounter (Signed)
Rachel [name] 07/22/1951. She was sent a letter to serve on jury duty and she said she cannot sit on it because she cannot remember anything. She would like a letter to excuse her from jury duty. Her # is [phone]. Thank you

## 2016-03-21 NOTE — Telephone Encounter (Signed)
Okay for me to write?

## 2016-03-21 NOTE — Telephone Encounter (Signed)
Patient states that we dont need to do a letter they have the last one that extends it pt phone number is [phone]

## 2016-03-21 NOTE — Telephone Encounter (Signed)
I think that the only thing that we can write is that pt has perceived cognitive impairment and ask that they take that into consideration because her neuropsych testing looked good.  That okay with her?

## 2016-03-21 NOTE — Patient Instructions (Signed)
1.Dr Tat recommends that you complete a bowel purge (to clean out your bowels). Please do the following: Purchase a bottle of Miralax over the counter as well as a box of 5 mg dulcolax tablets. Take 4 dulcolax tablets. Wait 1 hour. You will then drink 6-8 capfuls of Miralax mixed in an adequate amount of water/juice/gatorade (you may choose which of these liquids to drink) over the next 2-3 hours. You should expect results within 1 to 6 hours after completing the bowel purge.  2. Start Lyrica 50 mg - take one tablet twice daily.

## 2016-03-27 DIAGNOSIS — M5030 Other cervical disc degeneration, unspecified cervical region: Secondary | ICD-10-CM | POA: Diagnosis not present

## 2016-03-27 DIAGNOSIS — M9902 Segmental and somatic dysfunction of thoracic region: Secondary | ICD-10-CM | POA: Diagnosis not present

## 2016-03-27 DIAGNOSIS — M9903 Segmental and somatic dysfunction of lumbar region: Secondary | ICD-10-CM | POA: Diagnosis not present

## 2016-03-27 DIAGNOSIS — M9901 Segmental and somatic dysfunction of cervical region: Secondary | ICD-10-CM | POA: Diagnosis not present

## 2016-03-27 DIAGNOSIS — M5136 Other intervertebral disc degeneration, lumbar region: Secondary | ICD-10-CM | POA: Diagnosis not present

## 2016-04-03 DIAGNOSIS — M5030 Other cervical disc degeneration, unspecified cervical region: Secondary | ICD-10-CM | POA: Diagnosis not present

## 2016-04-03 DIAGNOSIS — M9903 Segmental and somatic dysfunction of lumbar region: Secondary | ICD-10-CM | POA: Diagnosis not present

## 2016-04-03 DIAGNOSIS — M5136 Other intervertebral disc degeneration, lumbar region: Secondary | ICD-10-CM | POA: Diagnosis not present

## 2016-04-03 DIAGNOSIS — M9902 Segmental and somatic dysfunction of thoracic region: Secondary | ICD-10-CM | POA: Diagnosis not present

## 2016-04-03 DIAGNOSIS — M9901 Segmental and somatic dysfunction of cervical region: Secondary | ICD-10-CM | POA: Diagnosis not present

## 2016-04-05 ENCOUNTER — Telehealth: Payer: Self-pay | Admitting: Neurology

## 2016-04-05 NOTE — Telephone Encounter (Signed)
Left message on machine for patient to call back.  Made her aware if she needs a refill she can call her pharmacy.

## 2016-04-05 NOTE — Telephone Encounter (Signed)
PT called in regards to a prescription being called in/[name] CB# [phone] and asked if it could be before 12:00 call back

## 2016-04-12 ENCOUNTER — Encounter: Payer: Self-pay | Admitting: Neurology

## 2016-04-12 ENCOUNTER — Telehealth: Payer: Self-pay | Admitting: Neurology

## 2016-04-12 NOTE — Telephone Encounter (Signed)
PT called and wants a prescription called in from Dr Tat, new prescription/[name] CB# [phone]

## 2016-04-12 NOTE — Telephone Encounter (Signed)
Unless it is nausea associated with levodopa, I would prefer it comes from Dr. Brigitte Pulse so that I know that they have worked up possible etiologies

## 2016-04-12 NOTE — Telephone Encounter (Signed)
I addressed her in a my chart message to her.  Thx

## 2016-04-12 NOTE — Telephone Encounter (Signed)
Patient made aware.

## 2016-04-12 NOTE — Telephone Encounter (Signed)
Patient called back again and states that it does happen when she takes her medications.  When I inquire how long it has been going on, she doesn't answer but just says, "a long time."  She has been on this medication for years.  I made her aware that Dr. Carles Collet wanted her to see Dr. Brigitte Pulse for zofran because she wanted any other reasons for her vomiting ruled out.  She then stated that she "knew it was from the [place]" her stomach has gotten worse over the last two years and "MSA is causing it."  I asked if she had seen a GI doctor and she said "not in awhile" and I advised that it would be a good idea to be evaluated by one again since she is having stomach issues.  She then got defensive and said, "Are you telling me MSA can't cause stomach issues?"  I made her aware that this does cause constipation issues and slow digestion, but not nausea and vomiting.  She said thank you, clearly unhappy, and hung up the phone.

## 2016-04-12 NOTE — Telephone Encounter (Signed)
Patient states sometimes she will get overwhelming nausea out of nowhere and if she takes zofran it will help. She was prescribed by Dr. Manuella Ghazi a long time ago. She is wanting to know if Dr. Carles Collet will prescribe this- I made patient aware probably better to come from PCP but I would ask.  Dr. Carles Collet- would you okay RX for zofran?

## 2016-05-10 DIAGNOSIS — E784 Other hyperlipidemia: Secondary | ICD-10-CM | POA: Diagnosis not present

## 2016-05-10 DIAGNOSIS — I10 Essential (primary) hypertension: Secondary | ICD-10-CM | POA: Diagnosis not present

## 2016-05-10 DIAGNOSIS — M810 Age-related osteoporosis without current pathological fracture: Secondary | ICD-10-CM | POA: Diagnosis not present

## 2016-05-10 DIAGNOSIS — E038 Other specified hypothyroidism: Secondary | ICD-10-CM | POA: Diagnosis not present

## 2016-05-17 DIAGNOSIS — G903 Multi-system degeneration of the autonomic nervous system: Secondary | ICD-10-CM | POA: Diagnosis not present

## 2016-05-17 DIAGNOSIS — E038 Other specified hypothyroidism: Secondary | ICD-10-CM | POA: Diagnosis not present

## 2016-05-17 DIAGNOSIS — Z0000 Encounter for general adult medical examination without abnormal findings: Secondary | ICD-10-CM | POA: Diagnosis not present

## 2016-05-17 DIAGNOSIS — M810 Age-related osteoporosis without current pathological fracture: Secondary | ICD-10-CM | POA: Diagnosis not present

## 2016-05-17 DIAGNOSIS — Z6836 Body mass index (BMI) 36.0-36.9, adult: Secondary | ICD-10-CM | POA: Diagnosis not present

## 2016-05-17 DIAGNOSIS — E784 Other hyperlipidemia: Secondary | ICD-10-CM | POA: Diagnosis not present

## 2016-05-17 DIAGNOSIS — G3184 Mild cognitive impairment, so stated: Secondary | ICD-10-CM | POA: Diagnosis not present

## 2016-05-17 DIAGNOSIS — R258 Other abnormal involuntary movements: Secondary | ICD-10-CM | POA: Diagnosis not present

## 2016-05-17 DIAGNOSIS — K5909 Other constipation: Secondary | ICD-10-CM | POA: Diagnosis not present

## 2016-05-17 DIAGNOSIS — I10 Essential (primary) hypertension: Secondary | ICD-10-CM | POA: Diagnosis not present

## 2016-05-20 DIAGNOSIS — M79605 Pain in left leg: Secondary | ICD-10-CM | POA: Diagnosis not present

## 2016-05-20 DIAGNOSIS — M7989 Other specified soft tissue disorders: Secondary | ICD-10-CM | POA: Diagnosis not present

## 2016-05-27 DIAGNOSIS — M9902 Segmental and somatic dysfunction of thoracic region: Secondary | ICD-10-CM | POA: Diagnosis not present

## 2016-05-27 DIAGNOSIS — M5030 Other cervical disc degeneration, unspecified cervical region: Secondary | ICD-10-CM | POA: Diagnosis not present

## 2016-05-27 DIAGNOSIS — M9903 Segmental and somatic dysfunction of lumbar region: Secondary | ICD-10-CM | POA: Diagnosis not present

## 2016-05-27 DIAGNOSIS — M9901 Segmental and somatic dysfunction of cervical region: Secondary | ICD-10-CM | POA: Diagnosis not present

## 2016-05-27 DIAGNOSIS — M5136 Other intervertebral disc degeneration, lumbar region: Secondary | ICD-10-CM | POA: Diagnosis not present

## 2016-06-03 DIAGNOSIS — M9901 Segmental and somatic dysfunction of cervical region: Secondary | ICD-10-CM | POA: Diagnosis not present

## 2016-06-03 DIAGNOSIS — M5030 Other cervical disc degeneration, unspecified cervical region: Secondary | ICD-10-CM | POA: Diagnosis not present

## 2016-06-03 DIAGNOSIS — M5136 Other intervertebral disc degeneration, lumbar region: Secondary | ICD-10-CM | POA: Diagnosis not present

## 2016-06-03 DIAGNOSIS — M9903 Segmental and somatic dysfunction of lumbar region: Secondary | ICD-10-CM | POA: Diagnosis not present

## 2016-06-03 DIAGNOSIS — M9902 Segmental and somatic dysfunction of thoracic region: Secondary | ICD-10-CM | POA: Diagnosis not present

## 2016-06-04 ENCOUNTER — Telehealth: Payer: Self-pay | Admitting: Neurology

## 2016-06-04 MED ORDER — CARBIDOPA-LEVODOPA 25-100 MG PO TABS: ORAL_TABLET | ORAL | 5 refills | Status: DC

## 2016-06-04 NOTE — Telephone Encounter (Signed)
Last office note states patient taking Levodopa five times daily. RX sent to pharmacy.

## 2016-06-04 NOTE — Telephone Encounter (Signed)
Patient states that she needs a updated rx on her carbidopa levodopa. She states that she increased herself to 5 pills a day. Patient phone number is [phone] and she uses the [facility] on [address]

## 2016-06-10 DIAGNOSIS — M9901 Segmental and somatic dysfunction of cervical region: Secondary | ICD-10-CM | POA: Diagnosis not present

## 2016-06-10 DIAGNOSIS — M9902 Segmental and somatic dysfunction of thoracic region: Secondary | ICD-10-CM | POA: Diagnosis not present

## 2016-06-10 DIAGNOSIS — M9903 Segmental and somatic dysfunction of lumbar region: Secondary | ICD-10-CM | POA: Diagnosis not present

## 2016-06-10 DIAGNOSIS — M5136 Other intervertebral disc degeneration, lumbar region: Secondary | ICD-10-CM | POA: Diagnosis not present

## 2016-06-10 DIAGNOSIS — M5030 Other cervical disc degeneration, unspecified cervical region: Secondary | ICD-10-CM | POA: Diagnosis not present

## 2016-06-11 NOTE — Progress Notes (Signed)
bSubjective:   Rachel [name] was seen in consultation in the movement disorder clinic at the request of Marton Redwood, MD.  The evaluation is for tremor.  The patient is a 65 y.o. right handed female with a history of tremor.  The pt reports that she first noted shaking in the legs and arms bilaterally about 1 - 1.5 years ago.  It was intermittent.  She now notices tremor of the hands, legs and head.  She has better and worse days.  She notes that lying in the recliner helps.  She notes that walking and exercise makes it worse.  The L leg feels that it will stiffen up and cause the tremor.  There is a family hx of tremor in her mother but it was undiagnosed.    Affected by caffeine:  yes (quit drinking caffeine all together) Exacerbated by albuterol:  Yes Affected by alcohol:  unknown Affected by stress:  yes Affected by fatigue:  yes Spills soup if on spoon:  yes Spills glass of liquid if full:  yes Affects ADL's (tying shoes, brushing teeth, etc):  no Affects ability to put on makeup:  Yes  09/1813 update:  The patient is following up today regarding tremor.  She is accompanied by her spouse, who supplements the history.  I have not seen the patient since her first appointment in August, 2014.  At that point in time, she did not want any medication for the tremor.  She did have an MRI of the brain in August, 2014 that was normal.  I did review this.  Over the course of time, she has noted tremor in the L foot and hand.  She states that if she uses the hands, her L leg will stiffen and will eventually hurt.  Minimal neck pain.  Has back pain but thinks that is independent.  Her bladder/bowel work okay.  Tremor is much better in the hands, L more than R, but it is better now that she is taking herbs that her chiropractor gave her.  She is receiving kinesiology treatment.  She feels that her walking is "awkward" because of the foot.  She has trouble with arm swing.  She reports some shakiness with  handwriting (R hand dominant) but no micrographia.  She is sleeping well.  She has no vivid dreams.  No sleep walking/talking.  No diplopia.  Does have hx of scintillating scotomas that is sometimes associated with migraine.  Denies swallowing problems.  No speech changes per pt but husband states that sometimes pt inserts one word for another or has word finding trouble.  Has "no smell" but able to taste, but thinks that there is some loss because of not being able to taste food.  She admits that she has been researching various causes of her symptoms, including Parkinson's and dystonias.  She requests a DaT scan.  11/19/13 update:  The patient is following up today, accompanied by her husband who helps to supplement the history.  Since our last visit, the patient did undergo a DaT scan.  No films are available.  However, I did get the report.  There was asymmetric uptake, with no uptake in the right putamen and normal on the left.  Pt opted to hold on the MRIs ordered last visit.  We did start carbidopa/levodopa last visit and the patient states that it is about 30% beneficial for her symptoms.   03/21/14 update:  This patient is accompanied in the office by her spouse who supplements  the history.  Last visit, I increased the patients carbidopa/levodopa 25/100 to qid but she hasn't really need to do that.  She is overall doing much better.  I did send her for PT/OT and that has helped.   She has had some constipation and been on miralax for that.  She states that she had severe "brain fog" yesterday and thinks that it is related to the levodopa.  She was driving and was confused about where she was.  She has had some other minor episodes prior to that of feeling confused but nothing like that.  She states that her chiropracter gave her "brain medicine" (2 of them, one she calls fito-brain B12) and she states that medicine allowed her arm to swing. She sees her chiropractor daily now and is very pleased with her  services.  The levodopa did help so her leg no longer tremors.   She does think that she has had her thyroid checked this year and states that her synthroid was increased last visit but cannot remember when that was.    07/11/14 update:  The patient is following up today, accompanied by her husband who supplements the history.  The patient is currently on carbidopa/levodopa 25/100, 4 times per day (went back up from 3 times per day because of "jerking" and foot dystonia).  I reviewed records since her last visit.  She was in her wife for vacation in October when she called me.  She stated that she initially had a scratchy throat and then suddenly she was not able to taste.  This sounded viral to me, but I told her that nonetheless it was not related to [place], as symptoms do not come on that acutely.  I told her that she could follow up with a physician in [place].   She states that it took 6-8 weeks to go away, but her husband got the same thing.   She is not falling currently but thinks that her balance is really not good.  She cannot remember the last PT date.  She still has trouble with her memory.  That is her biggest c/o.  She is having word finding trouble.    12/02/14 update:  The patient is f/u today.  She is on carbidopa/levodopa 25/100 four times a day.  Her sister in law died a few weeks ago after complications from a fall.  This was unexpected.  She was only in her 54's.  She went for neuropsych testing since our last visit.  No evidence of dementia.  Evidence of adjustment disorder with mixed anxiety/depression and felt that she may benefit from brief trial of psychotherapy.  Pt states that she is doing well in that regard and does not think she needs the therapy right now and she did start some physical therapy since last visit, but she states that she did not end up completing it because something else came up, but feels that she has been doing well.  Her left foot only turns and when she is very  tired.  She has diplopia when looking left.  She has an appt with neuro-opth (Dr. Hassell Done) at the end of may.    04/13/15 update:  The patient is following up today regarding her MSA.  She is carbidopa/levodopa 25/100 25/100, 1 tablet 4 times per day.  Overall, the patient reports that she is doing well.  She has gotten a new job at the [employer] since our last visit.  She is working at  the corporate office as a receptionist.  She is having to multitask which is good for her.  Since her last visit, she did see Dr. Sanda Klein at [facility] in regards to her diplopia.  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  He said that the diplopia is not a component directly of MSA, but rather perhaps a loss of a compensatory ability secondary to [place].  He did not know why the symptoms were just with left gaze as the same dissociation occurred in both right and left gaze.  The patient was not interested in surgery, but he did recommend prisms and the patient was going to follow-up with Dr. Ellie Lunch at [facility] ophthalmology to discuss that further.  She has a temporary prism now and she can drive again now with it and she loves that.  She asks me about a permanent license plate (handicap) instead of the placard as she forgets to hang the placard sometimes.  She has had no falls but she has to be very careful.  She runs into walls a lot.  She is doing okay with swallowing (nothing worse than usual per pt).  She is walking and riding her bike for exercise.  If she doesn't her L leg will stiffen up.  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  She is on carbidopa/levodopa 25/100, 1 tablet 4 times per day.  She has had more tremor of the L leg and "it just drives me crazy."  She has had more constipation and miralax became less effective.  She has not had any falls since last visit.  No hallucinations.  No lightheadedness or near syncope.   Physical therapy notes indicate that the patient canceled multiple visits and ultimately was discharged because of that.  She states that at the time she felt that she didn't need it but now she has gotten weaker.  She has backed down on the supplements because she was getting sick; she is just taking Calium, D3 and still taking the "dopatone."  She is also very tired because she is not sleeping well.  10/12/15 update:  Pt f/u today re: MSA.  She is on carbidopa/levodopa 25/100 four times a day (9am/2:30pm/7pm/11pm) and I did recommend that she stop her dopatone supplement.  She is still on this but might take it every other day.  She is taking other supplements as well ("glutathione recycler").  She does state that her chiropractor gave her a different supplement and told her to d/c dopatone and told her to take [facility] glutathione and "it made a great difference."  She is taking it a bit after the meal times.  She was given the rancho recipe for constipation and I recommended that she start melatonin for insomnia.  She didn't try the rancho recipe because bran had gluten and gluten gives her headaches so she mixes prunes juice with miralax and uses a laxative tea.  She reports today that she has not had any falls.  She has not had any hallucinations or near syncope.  She was sent for PT at last visit but then refused therapy when contacted by them.  She is not exercising regularly but is occasionally riding her bike.  She works 4.5 daily.  02/08/16 update:  Pt f/u today re: MSA.  This patient is accompanied in the office by her spouse who supplements the history.  She was on carbidopa/levodopa 25/100 four times a day  last visit.  She called after our last visit and stated that she felt somewhat more stiff and I told her she could try an extra half tablet as needed.  She states that she has increased it to one tablet 5 times a day.  Feels that she has been weaker.  L foot been moving more and "drives me crazy."   She  has been using the indoor bike.  She denies any falls but she is having near falls.  Asks for RX for walker.    She denies any lightheadedness or near syncope.  No hallucinations.  Mood has been good per pt and husband.    Using melatonin for insomnia.  03/21/16 update:  Patient is seen today.  She remains on carbidopa/levodopa 25/100, 1 tablet 5 times per day.  Last visit, I started her on amantadine, 100 mg twice a day for dyskinesia.  She states that it helped the dyskinesia, but she thought it caused headache.  This would be an unusual side effect, but ended up calling her in a Medrol Dosepak.  She states that never even had to fill that RX because the headaches went away and she does think that it was allergy related.  She came today because her muscles are painful and hurting.  All month she has been struggling to even get off the couch to walk.  Her muscles are burning when they walk but not when she sits.  She has not had any falls since last visit. Last labs were 6-8 months ago.  I did give her a prescription for a Rollator a few weeks ago and she states that she has not gotten that yet.  She has not had any hallucinations.  No lightheadedness or near syncope.  Is having issues with constipation.  Tried rancho recipe.  On linzess.  4 colace per day without relief.  Even had n/v because of it.  Having trouble getting eyeliner on due to tremor.  06/13/16 update:  Patient seen in follow-up today.  She is on carbidopa/levodopa 25/100, one tablet 5 times per day.  She is on amantadine, 100 mg twice per day.  Pt denies falls but she has gotten close.  She has a rollator but doesn't like to use it at work.  Pt denies lightheadedness, near syncope.  No hallucinations.  Mood has been good.  She has had intermittent nausea for which Dr. Brigitte Pulse had been prescribing Zofran.  She called here and wanted me to prescribe that medication, but I told her I would prefer that come from the original prescriber.  States that  she has a routine now and she takes medication slowly in the AM and that helped her stomach.  I gave her a bowel purge recipe last visit and she states that it worked but she also states that she is using linzess and that has really helped.  I gave her samples of lyrica last visit for paresthesias and diffuse muscle pain but she read the package insert and she was too scared to try it.     Current/Previously tried tremor medications: tried propranolol on one occasion but seemed to make her sick and she did not want to take the next pill  Current medications that may exacerbate tremor:  proair  Outside reports reviewed: historical medical records.  Allergies  Allergen Reactions  . Alendronate Other (See Comments)    headache  . Symbicort [Budesonide-Formoterol Fumarate] Other (See Comments)    Shakes and muscle weakness  Current Outpatient Prescriptions on File Prior to Visit  Medication Sig Dispense Refill  . amantadine (SYMMETREL) 100 MG capsule Take 1 capsule (100 mg total) by mouth 2 (two) times daily. 60 capsule 5  . CALCIUM LACTATE PO Take 6 tablets by mouth daily.    . carbidopa-levodopa (SINEMET IR) 25-100 MG tablet TAKE ONE TABLET BY MOUTH 5 TIMES DAILY 150 tablet 5  . Cholecalciferol (VITAMIN D-3 PO) Take 3 tablets by mouth daily.    . citalopram (CELEXA) 20 MG tablet Take 20 mg by mouth daily.     Marland Kitchen docusate sodium (COLACE) 100 MG capsule Take 200 mg by mouth 2 (two) times daily.    Marland Kitchen levothyroxine (SYNTHROID, LEVOTHROID) 88 MCG tablet Take 88 mcg by mouth at bedtime.    Marland Kitchen linaclotide (LINZESS) 72 MCG capsule Take 72 mcg by mouth 4 (four) times daily.    . Multiple Vitamin (MULTIVITAMIN WITH MINERALS) TABS tablet Take 2 tablets by mouth daily.    Marland Kitchen omeprazole (PRILOSEC OTC) 20 MG tablet Take 20 mg by mouth daily before breakfast.    . ondansetron (ZOFRAN) 4 MG tablet Take 4 mg by mouth as needed for nausea or vomiting.    Marland Kitchen OVER THE COUNTER MEDICATION Nitric Balance Phyto  Brain AC Glutathione    . pregabalin (LYRICA) 50 MG capsule Take 1 capsule (50 mg total) by mouth 3 (three) times daily. 84 capsule 0  . VOLTAREN 1 % GEL      No current facility-administered medications on file prior to visit.     Past Medical History:  Diagnosis Date  . Anemia   . Arthritis   . Asthma   . Blood clot in vein  age 37   left leg  . GERD (gastroesophageal reflux disease)   . Hyperlipidemia   . Hypertension    pt denies, in physician record  . Hypothyroidism   . Multiple pregnancy loss, not currently pregnant    multiple miscarriages-pt.unsure of #  . Multiple system atrophy ([place]) 2015  . Nephrolithiasis    hx of kidney stones, last 6 months ago  . Osteopenia   . Tendinitis    left foot    Past Surgical History:  Procedure Laterality Date  . BREAST SURGERY     rt. lumpectomy, benign  . colonscopy  2011  . cystoscopy  2012  . JOINT REPLACEMENT  05-2007   right knee replacment/revision  . KIDNEY STONE SURGERY    . REPLACEMENT TOTAL KNEE Left 06/05/12  . TOTAL KNEE REVISION  06/03/2012   Procedure: TOTAL KNEE REVISION;  Surgeon: Gearlean Alf, MD;  Location: [facility];  Service: Orthopedics;  Laterality: Left;  Revision of a Left Uni Knee to a Total Knee Arthroplasty    Social History   Social History  . Marital status: Married    Spouse name: N/A  . Number of children: N/A  . Years of education: N/A   Occupational History  . retired [employer]   Social History Main Topics  . Smoking status: Never Smoker  . Smokeless tobacco: Never Used  . Alcohol use No  . Drug use: No  . Sexual activity: No   Other Topics Concern  . Not on file   Social History Narrative  . No narrative on file    Family Status  Relation Status  . Mother Deceased   fall, death during surgery  . Father Deceased   ? heart  . Brother Alive  healthy  . Sister Alive   2, asthma, heart problem  . Child Alive   adopted, healthy    Review of  Systems A complete 10 system ROS was obtained and was negative apart from what is mentioned.   Objective:   VITALS:   There were no vitals filed for this visit. Wt Readings from Last 3 Encounters:  03/21/16 216 lb (98 kg)  02/08/16 223 lb (101.2 kg)  08/14/15 218 lb (98.9 kg)     Gen:  Appears stated age and in NAD. HEENT:  Normocephalic, atraumatic. The mucous membranes are moist. The superficial temporal arteries are without ropiness or tenderness. Cardiovascular: Regular rate and rhythm. Lungs: Clear to auscultation bilaterally. Neck: There are no carotid bruits noted bilaterally.  NEUROLOGICAL:  Orientation:  The patient is alert and oriented x 3.   Cranial nerves: There is good facial symmetry.  Extraocular muscles are intact and visual fields are full to confrontational testing. Speech is fluent and clear. Soft palate rises symmetrically and there is no tongue deviation. Hearing is intact to conversational tone. Tone: Tone is good throughout.  There is no spasticity or rigidity. Coordination:  The patient has good rapid alternating movements today Motor: Strength is 5/5 in the bilateral upper and lower extremities.  Shoulder shrug is equal bilaterally.  There is no pronator drift.  There are no fasciculations noted. Gait and Station: The patient is able to ambulate and has good arm swing today.  MOVEMENT EXAM: Tremor:   There is no tremor.  There is no dyskinesia today  Labs:  No results found for: TSH      Assessment/Plan:   1.  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  She and I talked about the diagnosis.  She will continue on carbidopa/levodopa 25/100 5 times a day.  She looks good on this medication.    -Amantadine 100 mg bid has helped dyskinesia  -talked about medical makeup application via tattoo parlor due to difficulty with application because of tremor.  2.  Cognitive impairment  -As above, she had neuropsych testing done in January, 2016 and  there was no evidence of dementia.  There was some evidence of mixed anxiety/depression and a brief trial of psychotherapy was recommended, but the patient felt that she really did not need that.  Feels that she still is having issues but doesn't wish to pursue counseling.    3.  Diplopia  -Pt saw Dr. Sanda Klein at [facility] in regards to her diplopia.  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  He said that the diplopia is not a component directly of MSA, but rather perhaps a loss of a compensatory ability secondary to [place].  She has a temporary prism now and it has really helped.  4.  Constipation  -Improved now.  Had the bowel purge recipe and that helped along with linzess and MiraLAX.  5.  Insomnia  -using melatonin, 3 mg and states that it is working  6.  Paresthesias and muscle pain, diffuse  -gave samples of lyrica 50 mg bid to try last visit but she didn't want to try after reading package insert.  Told her fine to hold but also fine to take it.  Risks, benefits, side effects and alternative therapies were discussed.  The opportunity to ask questions was given and they were answered to the best of my ability.  The patient expressed understanding and willingness to follow the outlined  treatment protocols.  7.  Follow up is anticipated in the next 5 months, sooner should new neurologic issues arise.  Much greater than 50% of this visit was spent in counseling with the patient .  Total face to face time:  25 min

## 2016-06-13 ENCOUNTER — Ambulatory Visit (INDEPENDENT_AMBULATORY_CARE_PROVIDER_SITE_OTHER): Payer: Medicare Other | Admitting: Neurology

## 2016-06-13 ENCOUNTER — Encounter: Payer: Self-pay | Admitting: Neurology

## 2016-06-13 VITALS — BP 130/72 | HR 83 | Ht 66.0 in | Wt 215.0 lb

## 2016-06-13 DIAGNOSIS — G903 Multi-system degeneration of the autonomic nervous system: Secondary | ICD-10-CM

## 2016-06-13 DIAGNOSIS — G239 Degenerative disease of basal ganglia, unspecified: Secondary | ICD-10-CM | POA: Diagnosis not present

## 2016-06-13 DIAGNOSIS — G249 Dystonia, unspecified: Secondary | ICD-10-CM

## 2016-06-13 DIAGNOSIS — G20 Parkinson's disease: Secondary | ICD-10-CM

## 2016-06-17 DIAGNOSIS — M9903 Segmental and somatic dysfunction of lumbar region: Secondary | ICD-10-CM | POA: Diagnosis not present

## 2016-06-17 DIAGNOSIS — M9901 Segmental and somatic dysfunction of cervical region: Secondary | ICD-10-CM | POA: Diagnosis not present

## 2016-06-17 DIAGNOSIS — M9902 Segmental and somatic dysfunction of thoracic region: Secondary | ICD-10-CM | POA: Diagnosis not present

## 2016-06-17 DIAGNOSIS — M5136 Other intervertebral disc degeneration, lumbar region: Secondary | ICD-10-CM | POA: Diagnosis not present

## 2016-06-17 DIAGNOSIS — M5030 Other cervical disc degeneration, unspecified cervical region: Secondary | ICD-10-CM | POA: Diagnosis not present

## 2016-06-24 DIAGNOSIS — M5136 Other intervertebral disc degeneration, lumbar region: Secondary | ICD-10-CM | POA: Diagnosis not present

## 2016-06-24 DIAGNOSIS — M9902 Segmental and somatic dysfunction of thoracic region: Secondary | ICD-10-CM | POA: Diagnosis not present

## 2016-06-24 DIAGNOSIS — M9903 Segmental and somatic dysfunction of lumbar region: Secondary | ICD-10-CM | POA: Diagnosis not present

## 2016-06-24 DIAGNOSIS — M9901 Segmental and somatic dysfunction of cervical region: Secondary | ICD-10-CM | POA: Diagnosis not present

## 2016-06-24 DIAGNOSIS — M5030 Other cervical disc degeneration, unspecified cervical region: Secondary | ICD-10-CM | POA: Diagnosis not present

## 2016-07-01 DIAGNOSIS — M9901 Segmental and somatic dysfunction of cervical region: Secondary | ICD-10-CM | POA: Diagnosis not present

## 2016-07-01 DIAGNOSIS — M5136 Other intervertebral disc degeneration, lumbar region: Secondary | ICD-10-CM | POA: Diagnosis not present

## 2016-07-01 DIAGNOSIS — M5030 Other cervical disc degeneration, unspecified cervical region: Secondary | ICD-10-CM | POA: Diagnosis not present

## 2016-07-01 DIAGNOSIS — M9902 Segmental and somatic dysfunction of thoracic region: Secondary | ICD-10-CM | POA: Diagnosis not present

## 2016-07-01 DIAGNOSIS — M9903 Segmental and somatic dysfunction of lumbar region: Secondary | ICD-10-CM | POA: Diagnosis not present

## 2016-07-08 DIAGNOSIS — M9902 Segmental and somatic dysfunction of thoracic region: Secondary | ICD-10-CM | POA: Diagnosis not present

## 2016-07-08 DIAGNOSIS — M9901 Segmental and somatic dysfunction of cervical region: Secondary | ICD-10-CM | POA: Diagnosis not present

## 2016-07-08 DIAGNOSIS — M5030 Other cervical disc degeneration, unspecified cervical region: Secondary | ICD-10-CM | POA: Diagnosis not present

## 2016-07-08 DIAGNOSIS — M5136 Other intervertebral disc degeneration, lumbar region: Secondary | ICD-10-CM | POA: Diagnosis not present

## 2016-07-08 DIAGNOSIS — M9903 Segmental and somatic dysfunction of lumbar region: Secondary | ICD-10-CM | POA: Diagnosis not present

## 2016-07-15 DIAGNOSIS — M9902 Segmental and somatic dysfunction of thoracic region: Secondary | ICD-10-CM | POA: Diagnosis not present

## 2016-07-15 DIAGNOSIS — M9903 Segmental and somatic dysfunction of lumbar region: Secondary | ICD-10-CM | POA: Diagnosis not present

## 2016-07-15 DIAGNOSIS — M5136 Other intervertebral disc degeneration, lumbar region: Secondary | ICD-10-CM | POA: Diagnosis not present

## 2016-07-15 DIAGNOSIS — M5030 Other cervical disc degeneration, unspecified cervical region: Secondary | ICD-10-CM | POA: Diagnosis not present

## 2016-07-15 DIAGNOSIS — M9901 Segmental and somatic dysfunction of cervical region: Secondary | ICD-10-CM | POA: Diagnosis not present

## 2016-08-05 ENCOUNTER — Other Ambulatory Visit: Payer: Self-pay | Admitting: Neurology

## 2016-08-09 DIAGNOSIS — M9903 Segmental and somatic dysfunction of lumbar region: Secondary | ICD-10-CM | POA: Diagnosis not present

## 2016-08-09 DIAGNOSIS — M5136 Other intervertebral disc degeneration, lumbar region: Secondary | ICD-10-CM | POA: Diagnosis not present

## 2016-08-09 DIAGNOSIS — M9901 Segmental and somatic dysfunction of cervical region: Secondary | ICD-10-CM | POA: Diagnosis not present

## 2016-08-09 DIAGNOSIS — M9902 Segmental and somatic dysfunction of thoracic region: Secondary | ICD-10-CM | POA: Diagnosis not present

## 2016-08-09 DIAGNOSIS — M5030 Other cervical disc degeneration, unspecified cervical region: Secondary | ICD-10-CM | POA: Diagnosis not present

## 2016-08-16 DIAGNOSIS — M9901 Segmental and somatic dysfunction of cervical region: Secondary | ICD-10-CM | POA: Diagnosis not present

## 2016-08-16 DIAGNOSIS — M9903 Segmental and somatic dysfunction of lumbar region: Secondary | ICD-10-CM | POA: Diagnosis not present

## 2016-08-16 DIAGNOSIS — M5136 Other intervertebral disc degeneration, lumbar region: Secondary | ICD-10-CM | POA: Diagnosis not present

## 2016-08-16 DIAGNOSIS — M9904 Segmental and somatic dysfunction of sacral region: Secondary | ICD-10-CM | POA: Diagnosis not present

## 2016-08-16 DIAGNOSIS — M9982 Other biomechanical lesions of thoracic region: Secondary | ICD-10-CM | POA: Diagnosis not present

## 2016-08-16 DIAGNOSIS — M5030 Other cervical disc degeneration, unspecified cervical region: Secondary | ICD-10-CM | POA: Diagnosis not present

## 2016-08-23 DIAGNOSIS — M9903 Segmental and somatic dysfunction of lumbar region: Secondary | ICD-10-CM | POA: Diagnosis not present

## 2016-08-23 DIAGNOSIS — M5136 Other intervertebral disc degeneration, lumbar region: Secondary | ICD-10-CM | POA: Diagnosis not present

## 2016-08-23 DIAGNOSIS — M9904 Segmental and somatic dysfunction of sacral region: Secondary | ICD-10-CM | POA: Diagnosis not present

## 2016-08-23 DIAGNOSIS — M9901 Segmental and somatic dysfunction of cervical region: Secondary | ICD-10-CM | POA: Diagnosis not present

## 2016-08-23 DIAGNOSIS — M9902 Segmental and somatic dysfunction of thoracic region: Secondary | ICD-10-CM | POA: Diagnosis not present

## 2016-08-23 DIAGNOSIS — M5030 Other cervical disc degeneration, unspecified cervical region: Secondary | ICD-10-CM | POA: Diagnosis not present

## 2016-08-30 DIAGNOSIS — M9901 Segmental and somatic dysfunction of cervical region: Secondary | ICD-10-CM | POA: Diagnosis not present

## 2016-08-30 DIAGNOSIS — M5030 Other cervical disc degeneration, unspecified cervical region: Secondary | ICD-10-CM | POA: Diagnosis not present

## 2016-08-30 DIAGNOSIS — M9903 Segmental and somatic dysfunction of lumbar region: Secondary | ICD-10-CM | POA: Diagnosis not present

## 2016-08-30 DIAGNOSIS — M5136 Other intervertebral disc degeneration, lumbar region: Secondary | ICD-10-CM | POA: Diagnosis not present

## 2016-09-09 DIAGNOSIS — M5030 Other cervical disc degeneration, unspecified cervical region: Secondary | ICD-10-CM | POA: Diagnosis not present

## 2016-09-09 DIAGNOSIS — M5136 Other intervertebral disc degeneration, lumbar region: Secondary | ICD-10-CM | POA: Diagnosis not present

## 2016-09-09 DIAGNOSIS — M9901 Segmental and somatic dysfunction of cervical region: Secondary | ICD-10-CM | POA: Diagnosis not present

## 2016-09-09 DIAGNOSIS — M9903 Segmental and somatic dysfunction of lumbar region: Secondary | ICD-10-CM | POA: Diagnosis not present

## 2016-09-16 DIAGNOSIS — M5136 Other intervertebral disc degeneration, lumbar region: Secondary | ICD-10-CM | POA: Diagnosis not present

## 2016-09-16 DIAGNOSIS — M9901 Segmental and somatic dysfunction of cervical region: Secondary | ICD-10-CM | POA: Diagnosis not present

## 2016-09-16 DIAGNOSIS — M9903 Segmental and somatic dysfunction of lumbar region: Secondary | ICD-10-CM | POA: Diagnosis not present

## 2016-09-16 DIAGNOSIS — M5030 Other cervical disc degeneration, unspecified cervical region: Secondary | ICD-10-CM | POA: Diagnosis not present

## 2016-09-23 DIAGNOSIS — M5136 Other intervertebral disc degeneration, lumbar region: Secondary | ICD-10-CM | POA: Diagnosis not present

## 2016-09-23 DIAGNOSIS — M9901 Segmental and somatic dysfunction of cervical region: Secondary | ICD-10-CM | POA: Diagnosis not present

## 2016-09-23 DIAGNOSIS — M9903 Segmental and somatic dysfunction of lumbar region: Secondary | ICD-10-CM | POA: Diagnosis not present

## 2016-09-23 DIAGNOSIS — M5030 Other cervical disc degeneration, unspecified cervical region: Secondary | ICD-10-CM | POA: Diagnosis not present

## 2016-10-02 DIAGNOSIS — M5030 Other cervical disc degeneration, unspecified cervical region: Secondary | ICD-10-CM | POA: Diagnosis not present

## 2016-10-02 DIAGNOSIS — M9901 Segmental and somatic dysfunction of cervical region: Secondary | ICD-10-CM | POA: Diagnosis not present

## 2016-10-02 DIAGNOSIS — M9903 Segmental and somatic dysfunction of lumbar region: Secondary | ICD-10-CM | POA: Diagnosis not present

## 2016-10-02 DIAGNOSIS — M5136 Other intervertebral disc degeneration, lumbar region: Secondary | ICD-10-CM | POA: Diagnosis not present

## 2016-10-04 DIAGNOSIS — M9901 Segmental and somatic dysfunction of cervical region: Secondary | ICD-10-CM | POA: Diagnosis not present

## 2016-10-04 DIAGNOSIS — M5030 Other cervical disc degeneration, unspecified cervical region: Secondary | ICD-10-CM | POA: Diagnosis not present

## 2016-10-04 DIAGNOSIS — M5136 Other intervertebral disc degeneration, lumbar region: Secondary | ICD-10-CM | POA: Diagnosis not present

## 2016-10-04 DIAGNOSIS — M9903 Segmental and somatic dysfunction of lumbar region: Secondary | ICD-10-CM | POA: Diagnosis not present

## 2016-10-14 DIAGNOSIS — M5136 Other intervertebral disc degeneration, lumbar region: Secondary | ICD-10-CM | POA: Diagnosis not present

## 2016-10-14 DIAGNOSIS — M5030 Other cervical disc degeneration, unspecified cervical region: Secondary | ICD-10-CM | POA: Diagnosis not present

## 2016-10-14 DIAGNOSIS — M9903 Segmental and somatic dysfunction of lumbar region: Secondary | ICD-10-CM | POA: Diagnosis not present

## 2016-10-14 DIAGNOSIS — M9901 Segmental and somatic dysfunction of cervical region: Secondary | ICD-10-CM | POA: Diagnosis not present

## 2016-10-14 NOTE — Progress Notes (Deleted)
bSubjective:   Rachel [name] was seen in consultation in the movement disorder clinic at the request of Rachel Redwood, MD.  The evaluation is for tremor.  The patient is a 66 y.o. right handed female with a history of tremor.  The pt reports that she first noted shaking in the legs and arms bilaterally about 1 - 1.5 years ago.  It was intermittent.  She now notices tremor of the hands, legs and head.  She has better and worse days.  She notes that lying in the recliner helps.  She notes that walking and exercise makes it worse.  The L leg feels that it will stiffen up and cause the tremor.  There is a family hx of tremor in her mother but it was undiagnosed.    Affected by caffeine:  yes (quit drinking caffeine all together) Exacerbated by albuterol:  Yes Affected by alcohol:  unknown Affected by stress:  yes Affected by fatigue:  yes Spills soup if on spoon:  yes Spills glass of liquid if full:  yes Affects ADL's (tying shoes, brushing teeth, etc):  no Affects ability to put on makeup:  Yes  09/1813 update:  The patient is following up today regarding tremor.  She is accompanied by her spouse, who supplements the history.  I have not seen the patient since her first appointment in August, 2014.  At that point in time, she did not want any medication for the tremor.  She did have an MRI of the brain in August, 2014 that was normal.  I did review this.  Over the course of time, she has noted tremor in the L foot and hand.  She states that if she uses the hands, her L leg will stiffen and will eventually hurt.  Minimal neck pain.  Has back pain but thinks that is independent.  Her bladder/bowel work okay.  Tremor is much better in the hands, L more than R, but it is better now that she is taking herbs that her chiropractor gave her.  She is receiving kinesiology treatment.  She feels that her walking is "awkward" because of the foot.  She has trouble with arm swing.  She reports some shakiness with  handwriting (R hand dominant) but no micrographia.  She is sleeping well.  She has no vivid dreams.  No sleep walking/talking.  No diplopia.  Does have hx of scintillating scotomas that is sometimes associated with migraine.  Denies swallowing problems.  No speech changes per pt but husband states that sometimes pt inserts one word for another or has word finding trouble.  Has "no smell" but able to taste, but thinks that there is some loss because of not being able to taste food.  She admits that she has been researching various causes of her symptoms, including Parkinson's and dystonias.  She requests a DaT scan.  11/19/13 update:  The patient is following up today, accompanied by her husband who helps to supplement the history.  Since our last visit, the patient did undergo a DaT scan.  No films are available.  However, I did get the report.  There was asymmetric uptake, with no uptake in the right putamen and normal on the left.  Pt opted to hold on the MRIs ordered last visit.  We did start carbidopa/levodopa last visit and the patient states that it is about 30% beneficial for her symptoms.   03/21/14 update:  This patient is accompanied in the office by her spouse who supplements  the history.  Last visit, I increased the patients carbidopa/levodopa 25/100 to qid but she hasn't really need to do that.  She is overall doing much better.  I did send her for PT/OT and that has helped.   She has had some constipation and been on miralax for that.  She states that she had severe "brain fog" yesterday and thinks that it is related to the levodopa.  She was driving and was confused about where she was.  She has had some other minor episodes prior to that of feeling confused but nothing like that.  She states that her chiropracter gave her "brain medicine" (2 of them, one she calls fito-brain B12) and she states that medicine allowed her arm to swing. She sees her chiropractor daily now and is very pleased with her  services.  The levodopa did help so her leg no longer tremors.   She does think that she has had her thyroid checked this year and states that her synthroid was increased last visit but cannot remember when that was.    07/11/14 update:  The patient is following up today, accompanied by her husband who supplements the history.  The patient is currently on carbidopa/levodopa 25/100, 4 times per day (went back up from 3 times per day because of "jerking" and foot dystonia).  I reviewed records since her last visit.  She was in her wife for vacation in October when she called me.  She stated that she initially had a scratchy throat and then suddenly she was not able to taste.  This sounded viral to me, but I told her that nonetheless it was not related to [place], as symptoms do not come on that acutely.  I told her that she could follow up with a physician in [place].   She states that it took 6-8 weeks to go away, but her husband got the same thing.   She is not falling currently but thinks that her balance is really not good.  She cannot remember the last PT date.  She still has trouble with her memory.  That is her biggest c/o.  She is having word finding trouble.    12/02/14 update:  The patient is f/u today.  She is on carbidopa/levodopa 25/100 four times a day.  Her sister in law died a few weeks ago after complications from a fall.  This was unexpected.  She was only in her 54's.  She went for neuropsych testing since our last visit.  No evidence of dementia.  Evidence of adjustment disorder with mixed anxiety/depression and felt that she may benefit from brief trial of psychotherapy.  Pt states that she is doing well in that regard and does not think she needs the therapy right now and she did start some physical therapy since last visit, but she states that she did not end up completing it because something else came up, but feels that she has been doing well.  Her left foot only turns and when she is very  tired.  She has diplopia when looking left.  She has an appt with neuro-opth (Dr. Hassell Done) at the end of may.    04/13/15 update:  The patient is following up today regarding her MSA.  She is carbidopa/levodopa 25/100 25/100, 1 tablet 4 times per day.  Overall, the patient reports that she is doing well.  She has gotten a new job at the [employer] since our last visit.  She is working at  the corporate office as a receptionist.  She is having to multitask which is good for her.  Since her last visit, she did see Dr. Sanda Klein at [facility] in regards to her diplopia.  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  He said that the diplopia is not a component directly of MSA, but rather perhaps a loss of a compensatory ability secondary to [place].  He did not know why the symptoms were just with left gaze as the same dissociation occurred in both right and left gaze.  The patient was not interested in surgery, but he did recommend prisms and the patient was going to follow-up with Dr. Ellie Lunch at [facility] ophthalmology to discuss that further.  She has a temporary prism now and she can drive again now with it and she loves that.  She asks me about a permanent license plate (handicap) instead of the placard as she forgets to hang the placard sometimes.  She has had no falls but she has to be very careful.  She runs into walls a lot.  She is doing okay with swallowing (nothing worse than usual per pt).  She is walking and riding her bike for exercise.  If she doesn't her L leg will stiffen up.  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  She is on carbidopa/levodopa 25/100, 1 tablet 4 times per day.  She has had more tremor of the L leg and "it just drives me crazy."  She has had more constipation and miralax became less effective.  She has not had any falls since last visit.  No hallucinations.  No lightheadedness or near syncope.   Physical therapy notes indicate that the patient canceled multiple visits and ultimately was discharged because of that.  She states that at the time she felt that she didn't need it but now she has gotten weaker.  She has backed down on the supplements because she was getting sick; she is just taking Calium, D3 and still taking the "dopatone."  She is also very tired because she is not sleeping well.  10/12/15 update:  Pt f/u today re: MSA.  She is on carbidopa/levodopa 25/100 four times a day (9am/2:30pm/7pm/11pm) and I did recommend that she stop her dopatone supplement.  She is still on this but might take it every other day.  She is taking other supplements as well ("glutathione recycler").  She does state that her chiropractor gave her a different supplement and told her to d/c dopatone and told her to take [facility] glutathione and "it made a great difference."  She is taking it a bit after the meal times.  She was given the rancho recipe for constipation and I recommended that she start melatonin for insomnia.  She didn't try the rancho recipe because bran had gluten and gluten gives her headaches so she mixes prunes juice with miralax and uses a laxative tea.  She reports today that she has not had any falls.  She has not had any hallucinations or near syncope.  She was sent for PT at last visit but then refused therapy when contacted by them.  She is not exercising regularly but is occasionally riding her bike.  She works 4.5 daily.  02/08/16 update:  Pt f/u today re: MSA.  This patient is accompanied in the office by her spouse who supplements the history.  She was on carbidopa/levodopa 25/100 four times a day  last visit.  She called after our last visit and stated that she felt somewhat more stiff and I told her she could try an extra half tablet as needed.  She states that she has increased it to one tablet 5 times a day.  Feels that she has been weaker.  L foot been moving more and "drives me crazy."   She  has been using the indoor bike.  She denies any falls but she is having near falls.  Asks for RX for walker.    She denies any lightheadedness or near syncope.  No hallucinations.  Mood has been good per pt and husband.    Using melatonin for insomnia.  03/21/16 update:  Patient is seen today.  She remains on carbidopa/levodopa 25/100, 1 tablet 5 times per day.  Last visit, I started her on amantadine, 100 mg twice a day for dyskinesia.  She states that it helped the dyskinesia, but she thought it caused headache.  This would be an unusual side effect, but ended up calling her in a Medrol Dosepak.  She states that never even had to fill that RX because the headaches went away and she does think that it was allergy related.  She came today because her muscles are painful and hurting.  All month she has been struggling to even get off the couch to walk.  Her muscles are burning when they walk but not when she sits.  She has not had any falls since last visit. Last labs were 6-8 months ago.  I did give her a prescription for a Rollator a few weeks ago and she states that she has not gotten that yet.  She has not had any hallucinations.  No lightheadedness or near syncope.  Is having issues with constipation.  Tried rancho recipe.  On linzess.  4 colace per day without relief.  Even had n/v because of it.  Having trouble getting eyeliner on due to tremor.  06/13/16 update:  Patient seen in follow-up today.  She is on carbidopa/levodopa 25/100, one tablet 5 times per day.  She is on amantadine, 100 mg twice per day.  Pt denies falls but she has gotten close.  She has a rollator but doesn't like to use it at work.  Pt denies lightheadedness, near syncope.  No hallucinations.  Mood has been good.  She has had intermittent nausea for which Dr. Brigitte Pulse had been prescribing Zofran.  She called here and wanted me to prescribe that medication, but I told her I would prefer that come from the original prescriber.  States that  she has a routine now and she takes medication slowly in the AM and that helped her stomach.  I gave her a bowel purge recipe last visit and she states that it worked but she also states that she is using linzess and that has really helped.  I gave her samples of lyrica last visit for paresthesias and diffuse muscle pain but she read the package insert and she was too scared to try it.    10/16/16 update:  Patient seen today in follow-up.  She remains on carbidopa/levodopa 25/100, one tablet 5 times per day and amantadine, 100 mg twice per day.  Overall, she has been fairly stable.  No falls.  No lightheadedness or near syncope.   Current/Previously tried tremor medications: tried propranolol on one occasion but seemed to make her sick and she did not want to take the next pill  Current medications that  may exacerbate tremor:  proair  Outside reports reviewed: historical medical records.  Allergies  Allergen Reactions  . Alendronate Other (See Comments)    headache  . Symbicort [Budesonide-Formoterol Fumarate] Other (See Comments)    Shakes and muscle weakness    Current Outpatient Prescriptions on File Prior to Visit  Medication Sig Dispense Refill  . amantadine (SYMMETREL) 100 MG capsule TAKE ONE CAPSULE BY MOUTH TWICE DAILY 180 capsule 1  . CALCIUM LACTATE PO Take 6 tablets by mouth daily.    . carbidopa-levodopa (SINEMET IR) 25-100 MG tablet TAKE ONE TABLET BY MOUTH 5 TIMES DAILY 150 tablet 5  . Cholecalciferol (VITAMIN D-3 PO) Take 3 tablets by mouth daily.    . citalopram (CELEXA) 20 MG tablet Take 20 mg by mouth daily.     Marland Kitchen levothyroxine (SYNTHROID, LEVOTHROID) 88 MCG tablet Take 88 mcg by mouth at bedtime.    Marland Kitchen linaclotide (LINZESS) 72 MCG capsule Take 72 mcg by mouth 4 (four) times daily.    . Multiple Vitamin (MULTIVITAMIN WITH MINERALS) TABS tablet Take 2 tablets by mouth daily.    Marland Kitchen omeprazole (PRILOSEC OTC) 20 MG tablet Take 20 mg by mouth daily before breakfast.    .  ondansetron (ZOFRAN) 4 MG tablet Take 4 mg by mouth as needed for nausea or vomiting.    Marland Kitchen OVER THE COUNTER MEDICATION Nitric Balance Phyto Brain AC Glutathione    . VOLTAREN 1 % GEL      No current facility-administered medications on file prior to visit.     Past Medical History:  Diagnosis Date  . Anemia   . Arthritis   . Asthma   . Blood clot in vein  age 67   left leg  . GERD (gastroesophageal reflux disease)   . Hyperlipidemia   . Hypertension    pt denies, in physician record  . Hypothyroidism   . Multiple pregnancy loss, not currently pregnant    multiple miscarriages-pt.unsure of #  . Multiple system atrophy ([place]) 2015  . Nephrolithiasis    hx of kidney stones, last 6 months ago  . Osteopenia   . Tendinitis    left foot    Past Surgical History:  Procedure Laterality Date  . BREAST SURGERY     rt. lumpectomy, benign  . colonscopy  2011  . cystoscopy  2012  . JOINT REPLACEMENT  05-2007   right knee replacment/revision  . KIDNEY STONE SURGERY    . REPLACEMENT TOTAL KNEE Left 06/05/12  . TOTAL KNEE REVISION  06/03/2012   Procedure: TOTAL KNEE REVISION;  Surgeon: Gearlean Alf, MD;  Location: [facility];  Service: Orthopedics;  Laterality: Left;  Revision of a Left Uni Knee to a Total Knee Arthroplasty    Social History   Social History  . Marital status: Married    Spouse name: N/A  . Number of children: N/A  . Years of education: N/A   Occupational History  . retired [employer]   Social History Main Topics  . Smoking status: Never Smoker  . Smokeless tobacco: Never Used  . Alcohol use No  . Drug use: No  . Sexual activity: No   Other Topics Concern  . Not on file   Social History Narrative  . No narrative on file    Family Status  Relation Status  . Mother Deceased   fall, death during surgery  . Father Deceased   ? heart  . Brother Alive   healthy  .  Sister Alive   2, asthma, heart problem  . Child Alive    adopted, healthy    Review of Systems A complete 10 system ROS was obtained and was negative apart from what is mentioned.   Objective:   VITALS:   There were no vitals filed for this visit. Wt Readings from Last 3 Encounters:  06/13/16 215 lb (97.5 kg)  03/21/16 216 lb (98 kg)  02/08/16 223 lb (101.2 kg)     Gen:  Appears stated age and in NAD. HEENT:  Normocephalic, atraumatic. The mucous membranes are moist. The superficial temporal arteries are without ropiness or tenderness. Cardiovascular: Regular rate and rhythm. Lungs: Clear to auscultation bilaterally. Neck: There are no carotid bruits noted bilaterally.  NEUROLOGICAL:  Orientation:  The patient is alert and oriented x 3.   Cranial nerves: There is good facial symmetry.  Extraocular muscles are intact and visual fields are full to confrontational testing. Speech is fluent and clear. Soft palate rises symmetrically and there is no tongue deviation. Hearing is intact to conversational tone. Tone: Tone is good throughout.  There is no spasticity or rigidity. Coordination:  The patient has good rapid alternating movements today Motor: Strength is 5/5 in the bilateral upper and lower extremities.  Shoulder shrug is equal bilaterally.  There is no pronator drift.  There are no fasciculations noted. Gait and Station: The patient is able to ambulate and has good arm swing today.  MOVEMENT EXAM: Tremor:   There is no tremor.  There is no dyskinesia today  Labs:  No results found for: TSH      Assessment/Plan:   1.  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  She and I talked about the diagnosis.  She will continue on carbidopa/levodopa 25/100 5 times a day.  She looks good on this medication.    -Amantadine 100 mg bid has helped dyskinesia  -talked about medical makeup application via tattoo parlor due to difficulty with application because of tremor.  2.  Cognitive impairment  -As above, she had neuropsych  testing done in January, 2016 and there was no evidence of dementia.  There was some evidence of mixed anxiety/depression and a brief trial of psychotherapy was recommended, but the patient felt that she really did not need that.  Feels that she still is having issues but doesn't wish to pursue counseling.    3.  Diplopia  -Pt saw Dr. Sanda Klein at [facility] in regards to her diplopia.  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  He said that the diplopia is not a component directly of MSA, but rather perhaps a loss of a compensatory ability secondary to [place].  She has a temporary prism now and it has really helped.  4.  Constipation  -Improved now.  Had the bowel purge recipe and that helped along with linzess and MiraLAX.  5.  Insomnia  -using melatonin, 3 mg and states that it is working  6.  Paresthesias and muscle pain, diffuse  -gave samples of lyrica 50 mg bid to try last visit but she didn't want to try after reading package insert.  Told her fine to hold but also fine to take it.  Risks, benefits, side effects and alternative therapies were discussed.  The opportunity to ask questions was given and they were answered to the best of my ability.  The patient expressed understanding and willingness to follow the outlined treatment protocols.  7.  Follow up is anticipated in the next 5 months, sooner should new neurologic issues arise.  Much greater than 50% of this visit was spent in counseling with the patient .  Total face to face time:  25 min

## 2016-10-16 ENCOUNTER — Ambulatory Visit: Payer: Medicare Other | Admitting: Neurology

## 2016-10-21 DIAGNOSIS — M9903 Segmental and somatic dysfunction of lumbar region: Secondary | ICD-10-CM | POA: Diagnosis not present

## 2016-10-21 DIAGNOSIS — M9901 Segmental and somatic dysfunction of cervical region: Secondary | ICD-10-CM | POA: Diagnosis not present

## 2016-10-21 DIAGNOSIS — M5136 Other intervertebral disc degeneration, lumbar region: Secondary | ICD-10-CM | POA: Diagnosis not present

## 2016-10-21 DIAGNOSIS — M5030 Other cervical disc degeneration, unspecified cervical region: Secondary | ICD-10-CM | POA: Diagnosis not present

## 2016-10-28 DIAGNOSIS — M9901 Segmental and somatic dysfunction of cervical region: Secondary | ICD-10-CM | POA: Diagnosis not present

## 2016-10-28 DIAGNOSIS — M5030 Other cervical disc degeneration, unspecified cervical region: Secondary | ICD-10-CM | POA: Diagnosis not present

## 2016-10-28 DIAGNOSIS — M5136 Other intervertebral disc degeneration, lumbar region: Secondary | ICD-10-CM | POA: Diagnosis not present

## 2016-10-28 DIAGNOSIS — M9903 Segmental and somatic dysfunction of lumbar region: Secondary | ICD-10-CM | POA: Diagnosis not present

## 2016-11-04 DIAGNOSIS — M9901 Segmental and somatic dysfunction of cervical region: Secondary | ICD-10-CM | POA: Diagnosis not present

## 2016-11-04 DIAGNOSIS — M9903 Segmental and somatic dysfunction of lumbar region: Secondary | ICD-10-CM | POA: Diagnosis not present

## 2016-11-04 DIAGNOSIS — M5030 Other cervical disc degeneration, unspecified cervical region: Secondary | ICD-10-CM | POA: Diagnosis not present

## 2016-11-04 DIAGNOSIS — M5136 Other intervertebral disc degeneration, lumbar region: Secondary | ICD-10-CM | POA: Diagnosis not present

## 2016-11-11 DIAGNOSIS — M9903 Segmental and somatic dysfunction of lumbar region: Secondary | ICD-10-CM | POA: Diagnosis not present

## 2016-11-11 DIAGNOSIS — M9901 Segmental and somatic dysfunction of cervical region: Secondary | ICD-10-CM | POA: Diagnosis not present

## 2016-11-11 DIAGNOSIS — M5136 Other intervertebral disc degeneration, lumbar region: Secondary | ICD-10-CM | POA: Diagnosis not present

## 2016-11-11 DIAGNOSIS — M5030 Other cervical disc degeneration, unspecified cervical region: Secondary | ICD-10-CM | POA: Diagnosis not present

## 2016-11-15 DIAGNOSIS — M5136 Other intervertebral disc degeneration, lumbar region: Secondary | ICD-10-CM | POA: Diagnosis not present

## 2016-11-15 DIAGNOSIS — M9904 Segmental and somatic dysfunction of sacral region: Secondary | ICD-10-CM | POA: Diagnosis not present

## 2016-11-15 DIAGNOSIS — M9905 Segmental and somatic dysfunction of pelvic region: Secondary | ICD-10-CM | POA: Diagnosis not present

## 2016-11-15 DIAGNOSIS — M9903 Segmental and somatic dysfunction of lumbar region: Secondary | ICD-10-CM | POA: Diagnosis not present

## 2016-11-18 DIAGNOSIS — M9905 Segmental and somatic dysfunction of pelvic region: Secondary | ICD-10-CM | POA: Diagnosis not present

## 2016-11-18 DIAGNOSIS — M5136 Other intervertebral disc degeneration, lumbar region: Secondary | ICD-10-CM | POA: Diagnosis not present

## 2016-11-18 DIAGNOSIS — M7062 Trochanteric bursitis, left hip: Secondary | ICD-10-CM | POA: Diagnosis not present

## 2016-11-18 DIAGNOSIS — M9904 Segmental and somatic dysfunction of sacral region: Secondary | ICD-10-CM | POA: Diagnosis not present

## 2016-11-18 DIAGNOSIS — M9903 Segmental and somatic dysfunction of lumbar region: Secondary | ICD-10-CM | POA: Diagnosis not present

## 2016-11-18 DIAGNOSIS — M545 Low back pain: Secondary | ICD-10-CM | POA: Diagnosis not present

## 2016-11-21 DIAGNOSIS — S32592A Other specified fracture of left pubis, initial encounter for closed fracture: Secondary | ICD-10-CM | POA: Diagnosis not present

## 2016-11-29 DIAGNOSIS — M9905 Segmental and somatic dysfunction of pelvic region: Secondary | ICD-10-CM | POA: Diagnosis not present

## 2016-11-29 DIAGNOSIS — M9903 Segmental and somatic dysfunction of lumbar region: Secondary | ICD-10-CM | POA: Diagnosis not present

## 2016-11-29 DIAGNOSIS — M9904 Segmental and somatic dysfunction of sacral region: Secondary | ICD-10-CM | POA: Diagnosis not present

## 2016-11-29 DIAGNOSIS — M5136 Other intervertebral disc degeneration, lumbar region: Secondary | ICD-10-CM | POA: Diagnosis not present

## 2016-12-02 DIAGNOSIS — M9904 Segmental and somatic dysfunction of sacral region: Secondary | ICD-10-CM | POA: Diagnosis not present

## 2016-12-02 DIAGNOSIS — M9905 Segmental and somatic dysfunction of pelvic region: Secondary | ICD-10-CM | POA: Diagnosis not present

## 2016-12-02 DIAGNOSIS — M9903 Segmental and somatic dysfunction of lumbar region: Secondary | ICD-10-CM | POA: Diagnosis not present

## 2016-12-02 DIAGNOSIS — M5136 Other intervertebral disc degeneration, lumbar region: Secondary | ICD-10-CM | POA: Diagnosis not present

## 2016-12-02 NOTE — Progress Notes (Addendum)
bSubjective:   Rachel [name] was seen in consultation in the movement disorder clinic at the request of Marton Redwood, MD.  The evaluation is for tremor.  The patient is a 66 y.o. right handed female with a history of tremor.  The pt reports that she first noted shaking in the legs and arms bilaterally about 1 - 1.5 years ago.  It was intermittent.  She now notices tremor of the hands, legs and head.  She has better and worse days.  She notes that lying in the recliner helps.  She notes that walking and exercise makes it worse.  The L leg feels that it will stiffen up and cause the tremor.  There is a family hx of tremor in her mother but it was undiagnosed.    Affected by caffeine:  yes (quit drinking caffeine all together) Exacerbated by albuterol:  Yes Affected by alcohol:  unknown Affected by stress:  yes Affected by fatigue:  yes Spills soup if on spoon:  yes Spills glass of liquid if full:  yes Affects ADL's (tying shoes, brushing teeth, etc):  no Affects ability to put on makeup:  Yes  09/1813 update:  The patient is following up today regarding tremor.  She is accompanied by her spouse, who supplements the history.  I have not seen the patient since her first appointment in August, 2014.  At that point in time, she did not want any medication for the tremor.  She did have an MRI of the brain in August, 2014 that was normal.  I did review this.  Over the course of time, she has noted tremor in the L foot and hand.  She states that if she uses the hands, her L leg will stiffen and will eventually hurt.  Minimal neck pain.  Has back pain but thinks that is independent.  Her bladder/bowel work okay.  Tremor is much better in the hands, L more than R, but it is better now that she is taking herbs that her chiropractor gave her.  She is receiving kinesiology treatment.  She feels that her walking is "awkward" because of the foot.  She has trouble with arm swing.  She reports some shakiness with  handwriting (R hand dominant) but no micrographia.  She is sleeping well.  She has no vivid dreams.  No sleep walking/talking.  No diplopia.  Does have hx of scintillating scotomas that is sometimes associated with migraine.  Denies swallowing problems.  No speech changes per pt but husband states that sometimes pt inserts one word for another or has word finding trouble.  Has "no smell" but able to taste, but thinks that there is some loss because of not being able to taste food.  She admits that she has been researching various causes of her symptoms, including Parkinson's and dystonias.  She requests a DaT scan.  11/19/13 update:  The patient is following up today, accompanied by her husband who helps to supplement the history.  Since our last visit, the patient did undergo a DaT scan.  No films are available.  However, I did get the report.  There was asymmetric uptake, with no uptake in the right putamen and normal on the left.  Pt opted to hold on the MRIs ordered last visit.  We did start carbidopa/levodopa last visit and the patient states that it is about 30% beneficial for her symptoms.   03/21/14 update:  This patient is accompanied in the office by her spouse who supplements  the history.  Last visit, I increased the patients carbidopa/levodopa 25/100 to qid but she hasn't really need to do that.  She is overall doing much better.  I did send her for PT/OT and that has helped.   She has had some constipation and been on miralax for that.  She states that she had severe "brain fog" yesterday and thinks that it is related to the levodopa.  She was driving and was confused about where she was.  She has had some other minor episodes prior to that of feeling confused but nothing like that.  She states that her chiropracter gave her "brain medicine" (2 of them, one she calls fito-brain B12) and she states that medicine allowed her arm to swing. She sees her chiropractor daily now and is very pleased with her  services.  The levodopa did help so her leg no longer tremors.   She does think that she has had her thyroid checked this year and states that her synthroid was increased last visit but cannot remember when that was.    07/11/14 update:  The patient is following up today, accompanied by her husband who supplements the history.  The patient is currently on carbidopa/levodopa 25/100, 4 times per day (went back up from 3 times per day because of "jerking" and foot dystonia).  I reviewed records since her last visit.  She was in her wife for vacation in October when she called me.  She stated that she initially had a scratchy throat and then suddenly she was not able to taste.  This sounded viral to me, but I told her that nonetheless it was not related to [place], as symptoms do not come on that acutely.  I told her that she could follow up with a physician in [place].   She states that it took 6-8 weeks to go away, but her husband got the same thing.   She is not falling currently but thinks that her balance is really not good.  She cannot remember the last PT date.  She still has trouble with her memory.  That is her biggest c/o.  She is having word finding trouble.    12/02/14 update:  The patient is f/u today.  She is on carbidopa/levodopa 25/100 four times a day.  Her sister in law died a few weeks ago after complications from a fall.  This was unexpected.  She was only in her 54's.  She went for neuropsych testing since our last visit.  No evidence of dementia.  Evidence of adjustment disorder with mixed anxiety/depression and felt that she may benefit from brief trial of psychotherapy.  Pt states that she is doing well in that regard and does not think she needs the therapy right now and she did start some physical therapy since last visit, but she states that she did not end up completing it because something else came up, but feels that she has been doing well.  Her left foot only turns and when she is very  tired.  She has diplopia when looking left.  She has an appt with neuro-opth (Dr. Hassell Done) at the end of may.    04/13/15 update:  The patient is following up today regarding her MSA.  She is carbidopa/levodopa 25/100 25/100, 1 tablet 4 times per day.  Overall, the patient reports that she is doing well.  She has gotten a new job at the [employer] since our last visit.  She is working at  the corporate office as a receptionist.  She is having to multitask which is good for her.  Since her last visit, she did see Dr. Sanda Klein at [facility] in regards to her diplopia.  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  He said that the diplopia is not a component directly of MSA, but rather perhaps a loss of a compensatory ability secondary to [place].  He did not know why the symptoms were just with left gaze as the same dissociation occurred in both right and left gaze.  The patient was not interested in surgery, but he did recommend prisms and the patient was going to follow-up with Dr. Ellie Lunch at [facility] ophthalmology to discuss that further.  She has a temporary prism now and she can drive again now with it and she loves that.  She asks me about a permanent license plate (handicap) instead of the placard as she forgets to hang the placard sometimes.  She has had no falls but she has to be very careful.  She runs into walls a lot.  She is doing okay with swallowing (nothing worse than usual per pt).  She is walking and riding her bike for exercise.  If she doesn't her L leg will stiffen up.  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  She is on carbidopa/levodopa 25/100, 1 tablet 4 times per day.  She has had more tremor of the L leg and "it just drives me crazy."  She has had more constipation and miralax became less effective.  She has not had any falls since last visit.  No hallucinations.  No lightheadedness or near syncope.   Physical therapy notes indicate that the patient canceled multiple visits and ultimately was discharged because of that.  She states that at the time she felt that she didn't need it but now she has gotten weaker.  She has backed down on the supplements because she was getting sick; she is just taking Calium, D3 and still taking the "dopatone."  She is also very tired because she is not sleeping well.  10/12/15 update:  Pt f/u today re: MSA.  She is on carbidopa/levodopa 25/100 four times a day (9am/2:30pm/7pm/11pm) and I did recommend that she stop her dopatone supplement.  She is still on this but might take it every other day.  She is taking other supplements as well ("glutathione recycler").  She does state that her chiropractor gave her a different supplement and told her to d/c dopatone and told her to take [facility] glutathione and "it made a great difference."  She is taking it a bit after the meal times.  She was given the rancho recipe for constipation and I recommended that she start melatonin for insomnia.  She didn't try the rancho recipe because bran had gluten and gluten gives her headaches so she mixes prunes juice with miralax and uses a laxative tea.  She reports today that she has not had any falls.  She has not had any hallucinations or near syncope.  She was sent for PT at last visit but then refused therapy when contacted by them.  She is not exercising regularly but is occasionally riding her bike.  She works 4.5 daily.  02/08/16 update:  Pt f/u today re: MSA.  This patient is accompanied in the office by her spouse who supplements the history.  She was on carbidopa/levodopa 25/100 four times a day  last visit.  She called after our last visit and stated that she felt somewhat more stiff and I told her she could try an extra half tablet as needed.  She states that she has increased it to one tablet 5 times a day.  Feels that she has been weaker.  L foot been moving more and "drives me crazy."   She  has been using the indoor bike.  She denies any falls but she is having near falls.  Asks for RX for walker.    She denies any lightheadedness or near syncope.  No hallucinations.  Mood has been good per pt and husband.    Using melatonin for insomnia.  03/21/16 update:  Patient is seen today.  She remains on carbidopa/levodopa 25/100, 1 tablet 5 times per day.  Last visit, I started her on amantadine, 100 mg twice a day for dyskinesia.  She states that it helped the dyskinesia, but she thought it caused headache.  This would be an unusual side effect, but ended up calling her in a Medrol Dosepak.  She states that never even had to fill that RX because the headaches went away and she does think that it was allergy related.  She came today because her muscles are painful and hurting.  All month she has been struggling to even get off the couch to walk.  Her muscles are burning when they walk but not when she sits.  She has not had any falls since last visit. Last labs were 6-8 months ago.  I did give her a prescription for a Rollator a few weeks ago and she states that she has not gotten that yet.  She has not had any hallucinations.  No lightheadedness or near syncope.  Is having issues with constipation.  Tried rancho recipe.  On linzess.  4 colace per day without relief.  Even had n/v because of it.  Having trouble getting eyeliner on due to tremor.  06/13/16 update:  Patient seen in follow-up today.  She is on carbidopa/levodopa 25/100, one tablet 5 times per day.  She is on amantadine, 100 mg twice per day.  Pt denies falls but she has gotten close.  She has a rollator but doesn't like to use it at work.  Pt denies lightheadedness, near syncope.  No hallucinations.  Mood has been good.  She has had intermittent nausea for which Dr. Brigitte Pulse had been prescribing Zofran.  She called here and wanted me to prescribe that medication, but I told her I would prefer that come from the original prescriber.  States that  she has a routine now and she takes medication slowly in the AM and that helped her stomach.  I gave her a bowel purge recipe last visit and she states that it worked but she also states that she is using linzess and that has really helped.  I gave her samples of lyrica last visit for paresthesias and diffuse muscle pain but she read the package insert and she was too scared to try it.    12/04/16 update:  Patient seen in follow-up today.  This patient is accompanied in the office by her spouse who supplements the history.  She is on carbidopa/levodopa 25/100, one tablet 5 times per day and occasionally will take 6 times per day.  She is on amantadine, 100 mg twice per day.   She fell last month while walking the dog.  She was going off the sidewalk and the walker tipped  as the dog pulled her a little and she had a hairline fx in the hip.  She is seeing Dr. Maureen Ralphs and told no surgery but has to stay on the walker full time for 2 weeks and then can go back to the cane.  Having some low BP's that make her feel near syncopal   Current/Previously tried tremor medications: tried propranolol on one occasion but seemed to make her sick and she did not want to take the next pill  Current medications that may exacerbate tremor:  proair  Outside reports reviewed: historical medical records.  Allergies  Allergen Reactions  . Alendronate Other (See Comments)    headache  . Symbicort [Budesonide-Formoterol Fumarate] Other (See Comments)    Shakes and muscle weakness    Current Outpatient Prescriptions on File Prior to Visit  Medication Sig Dispense Refill  . amantadine (SYMMETREL) 100 MG capsule TAKE ONE CAPSULE BY MOUTH TWICE DAILY 180 capsule 1  . CALCIUM LACTATE PO Take 6 tablets by mouth daily.    . carbidopa-levodopa (SINEMET IR) 25-100 MG tablet TAKE ONE TABLET BY MOUTH 5 TIMES DAILY 150 tablet 5  . Cholecalciferol (VITAMIN D-3 PO) Take 3 tablets by mouth daily.    . citalopram (CELEXA) 20 MG tablet  Take 20 mg by mouth daily.     Marland Kitchen levothyroxine (SYNTHROID, LEVOTHROID) 88 MCG tablet Take 88 mcg by mouth at bedtime.    Marland Kitchen linaclotide (LINZESS) 72 MCG capsule Take 72 mcg by mouth 4 (four) times daily.    . Multiple Vitamin (MULTIVITAMIN WITH MINERALS) TABS tablet Take 2 tablets by mouth daily.    Marland Kitchen omeprazole (PRILOSEC OTC) 20 MG tablet Take 20 mg by mouth daily before breakfast.    . ondansetron (ZOFRAN) 4 MG tablet Take 4 mg by mouth as needed for nausea or vomiting.    Marland Kitchen OVER THE COUNTER MEDICATION Phyto Brain    . VOLTAREN 1 % GEL      No current facility-administered medications on file prior to visit.     Past Medical History:  Diagnosis Date  . Anemia   . Arthritis   . Asthma   . Blood clot in vein  age 62   left leg  . GERD (gastroesophageal reflux disease)   . Hyperlipidemia   . Hypertension    pt denies, in physician record  . Hypothyroidism   . Multiple pregnancy loss, not currently pregnant    multiple miscarriages-pt.unsure of #  . Multiple system atrophy ([place]) 2015  . Nephrolithiasis    hx of kidney stones, last 6 months ago  . Osteopenia   . Tendinitis    left foot    Past Surgical History:  Procedure Laterality Date  . BREAST SURGERY     rt. lumpectomy, benign  . colonscopy  2011  . cystoscopy  2012  . JOINT REPLACEMENT  05-2007   right knee replacment/revision  . KIDNEY STONE SURGERY    . REPLACEMENT TOTAL KNEE Left 06/05/12  . TOTAL KNEE REVISION  06/03/2012   Procedure: TOTAL KNEE REVISION;  Surgeon: Gearlean Alf, MD;  Location: [facility];  Service: Orthopedics;  Laterality: Left;  Revision of a Left Uni Knee to a Total Knee Arthroplasty    Social History   Social History  . Marital status: Married    Spouse name: N/A  . Number of children: N/A  . Years of education: N/A   Occupational History  . retired [employer]  History Main Topics  . Smoking status: Never Smoker  . Smokeless tobacco: Never Used  .  Alcohol use No  . Drug use: No  . Sexual activity: No   Other Topics Concern  . Not on file   Social History Narrative  . No narrative on file    Family Status  Relation Status  . Mother Deceased   fall, death during surgery  . Father Deceased   ? heart  . Brother Alive   healthy  . Sister Alive   2, asthma, heart problem  . Child Alive   adopted, healthy    Review of Systems Has lost weight but is trying.  A complete 10 system ROS was obtained and was negative apart from what is mentioned.   Objective:   VITALS:   Vitals:   12/04/16 1105  BP: 108/70  Pulse: 72  SpO2: 98%  Weight: 198 lb (89.8 kg)  Height: 5\' 6"  (1.676 m)   Wt Readings from Last 3 Encounters:  12/04/16 198 lb (89.8 kg)  06/13/16 215 lb (97.5 kg)  03/21/16 216 lb (98 kg)     Gen:  Appears stated age and in NAD. HEENT:  Normocephalic, atraumatic. The mucous membranes are moist. The superficial temporal arteries are without ropiness or tenderness. Cardiovascular: Regular rate and rhythm. Lungs: Clear to auscultation bilaterally. Neck: There are no carotid bruits noted bilaterally.  NEUROLOGICAL:  Orientation:  The patient is alert and oriented x 3.   Cranial nerves: There is good facial symmetry.  Extraocular muscles are intact and visual fields are full to confrontational testing. Speech is fluent and clear. Soft palate rises symmetrically and there is no tongue deviation. Hearing is intact to conversational tone. Tone: Tone is good throughout.  There is no spasticity or rigidity. Coordination:  The patient has good rapid alternating movements today Motor: Strength is 5/5 in the bilateral upper and lower extremities.  Shoulder shrug is equal bilaterally.  There is no pronator drift.  There are no fasciculations noted. Gait and Station: The patient pushes off the chair to arise and walks well with the walker.  MOVEMENT EXAM: Tremor:   There is no tremor.  There is no dyskinesia  today  Labs:  No results found for: TSH  Received labs dated May 29, 2017.  Sodium was 142, potassium 4.0, chloride 102, CO2 31, BUN 20 and creatinine 1.0.  White blood cells were 4.3, hemoglobin 13.8, hematocrit 41.3 and platelets 266.  TSH just slightly elevated at 4.54.   Assessment/Plan:   1.  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  She and I talked about the diagnosis.  She will continue on carbidopa/levodopa 25/100 5-6 times a day.  She looks good on this medication.    -Amantadine 100 mg bid has helped dyskinesia  -just had hairline fx after fall but recovering well.  Doesn't want PT  2.  Cognitive impairment  -As above, she had neuropsych testing done in January, 2016 and there was no evidence of dementia.  There was some evidence of mixed anxiety/depression and a brief trial of psychotherapy was recommended, but the patient felt that she really did not need that.  Feels that she still is having issues but doesn't wish to pursue counseling.    3.  Diplopia  -Pt saw Dr. Sanda Klein at [facility] in regards to her diplopia.  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  He said that the diplopia  is not a component directly of MSA, but rather perhaps a loss of a compensatory ability secondary to [place].  She has a temporary prism now and it has really helped.  4.  Constipation  -Improved now.  Had the bowel purge recipe and that helped along with linzess and MiraLAX.  5.  Insomnia  -using melatonin, 3 mg and states that it is working  6.  Orthostatic hypotension  -talked about increasing water intake  -add abdominal compression binder and wear all times except at night  -may need to add medication if this doesn't help  7.  Follow up is anticipated in the next 5 months, sooner should new neurologic issues arise.  Much greater than 50% of this visit was spent in counseling with the patient .  Total face to face time:   25 min

## 2016-12-04 ENCOUNTER — Ambulatory Visit (INDEPENDENT_AMBULATORY_CARE_PROVIDER_SITE_OTHER): Payer: Medicare Other | Admitting: Neurology

## 2016-12-04 ENCOUNTER — Encounter: Payer: Self-pay | Admitting: Neurology

## 2016-12-04 VITALS — BP 108/70 | HR 72 | Ht 66.0 in | Wt 198.0 lb

## 2016-12-04 DIAGNOSIS — G249 Dystonia, unspecified: Secondary | ICD-10-CM

## 2016-12-04 DIAGNOSIS — G903 Multi-system degeneration of the autonomic nervous system: Secondary | ICD-10-CM | POA: Diagnosis not present

## 2016-12-04 DIAGNOSIS — G20 Parkinson's disease: Secondary | ICD-10-CM

## 2016-12-04 DIAGNOSIS — G239 Degenerative disease of basal ganglia, unspecified: Secondary | ICD-10-CM | POA: Diagnosis not present

## 2016-12-04 MED ORDER — ABDOMINAL BINDER/ELASTIC LARGE MISC: 1.0000 | Freq: Every day | 0 refills | Status: DC

## 2016-12-04 MED ORDER — CARBIDOPA-LEVODOPA 25-100 MG PO TABS: ORAL_TABLET | ORAL | 1 refills | Status: DC

## 2016-12-05 DIAGNOSIS — S32502D Unspecified fracture of left pubis, subsequent encounter for fracture with routine healing: Secondary | ICD-10-CM | POA: Diagnosis not present

## 2016-12-16 DIAGNOSIS — M9904 Segmental and somatic dysfunction of sacral region: Secondary | ICD-10-CM | POA: Diagnosis not present

## 2016-12-16 DIAGNOSIS — M9905 Segmental and somatic dysfunction of pelvic region: Secondary | ICD-10-CM | POA: Diagnosis not present

## 2016-12-16 DIAGNOSIS — M5136 Other intervertebral disc degeneration, lumbar region: Secondary | ICD-10-CM | POA: Diagnosis not present

## 2016-12-16 DIAGNOSIS — M9903 Segmental and somatic dysfunction of lumbar region: Secondary | ICD-10-CM | POA: Diagnosis not present

## 2016-12-30 DIAGNOSIS — M5136 Other intervertebral disc degeneration, lumbar region: Secondary | ICD-10-CM | POA: Diagnosis not present

## 2016-12-30 DIAGNOSIS — M9903 Segmental and somatic dysfunction of lumbar region: Secondary | ICD-10-CM | POA: Diagnosis not present

## 2016-12-30 DIAGNOSIS — M9904 Segmental and somatic dysfunction of sacral region: Secondary | ICD-10-CM | POA: Diagnosis not present

## 2016-12-30 DIAGNOSIS — M9905 Segmental and somatic dysfunction of pelvic region: Secondary | ICD-10-CM | POA: Diagnosis not present

## 2017-01-13 DIAGNOSIS — M9901 Segmental and somatic dysfunction of cervical region: Secondary | ICD-10-CM | POA: Diagnosis not present

## 2017-01-13 DIAGNOSIS — M9903 Segmental and somatic dysfunction of lumbar region: Secondary | ICD-10-CM | POA: Diagnosis not present

## 2017-01-13 DIAGNOSIS — M5030 Other cervical disc degeneration, unspecified cervical region: Secondary | ICD-10-CM | POA: Diagnosis not present

## 2017-01-13 DIAGNOSIS — M5136 Other intervertebral disc degeneration, lumbar region: Secondary | ICD-10-CM | POA: Diagnosis not present

## 2017-01-20 DIAGNOSIS — M5136 Other intervertebral disc degeneration, lumbar region: Secondary | ICD-10-CM | POA: Diagnosis not present

## 2017-01-20 DIAGNOSIS — M9901 Segmental and somatic dysfunction of cervical region: Secondary | ICD-10-CM | POA: Diagnosis not present

## 2017-01-20 DIAGNOSIS — M5030 Other cervical disc degeneration, unspecified cervical region: Secondary | ICD-10-CM | POA: Diagnosis not present

## 2017-01-20 DIAGNOSIS — M9903 Segmental and somatic dysfunction of lumbar region: Secondary | ICD-10-CM | POA: Diagnosis not present

## 2017-01-31 DIAGNOSIS — M5136 Other intervertebral disc degeneration, lumbar region: Secondary | ICD-10-CM | POA: Diagnosis not present

## 2017-01-31 DIAGNOSIS — M5030 Other cervical disc degeneration, unspecified cervical region: Secondary | ICD-10-CM | POA: Diagnosis not present

## 2017-01-31 DIAGNOSIS — M9901 Segmental and somatic dysfunction of cervical region: Secondary | ICD-10-CM | POA: Diagnosis not present

## 2017-01-31 DIAGNOSIS — M9903 Segmental and somatic dysfunction of lumbar region: Secondary | ICD-10-CM | POA: Diagnosis not present

## 2017-02-03 DIAGNOSIS — M9901 Segmental and somatic dysfunction of cervical region: Secondary | ICD-10-CM | POA: Diagnosis not present

## 2017-02-03 DIAGNOSIS — M5030 Other cervical disc degeneration, unspecified cervical region: Secondary | ICD-10-CM | POA: Diagnosis not present

## 2017-02-03 DIAGNOSIS — M9903 Segmental and somatic dysfunction of lumbar region: Secondary | ICD-10-CM | POA: Diagnosis not present

## 2017-02-03 DIAGNOSIS — M5136 Other intervertebral disc degeneration, lumbar region: Secondary | ICD-10-CM | POA: Diagnosis not present

## 2017-02-08 ENCOUNTER — Other Ambulatory Visit: Payer: Self-pay | Admitting: Neurology

## 2017-02-14 DIAGNOSIS — M5030 Other cervical disc degeneration, unspecified cervical region: Secondary | ICD-10-CM | POA: Diagnosis not present

## 2017-02-14 DIAGNOSIS — M9903 Segmental and somatic dysfunction of lumbar region: Secondary | ICD-10-CM | POA: Diagnosis not present

## 2017-02-14 DIAGNOSIS — M5136 Other intervertebral disc degeneration, lumbar region: Secondary | ICD-10-CM | POA: Diagnosis not present

## 2017-02-14 DIAGNOSIS — M9901 Segmental and somatic dysfunction of cervical region: Secondary | ICD-10-CM | POA: Diagnosis not present

## 2017-03-03 DIAGNOSIS — M9903 Segmental and somatic dysfunction of lumbar region: Secondary | ICD-10-CM | POA: Diagnosis not present

## 2017-03-03 DIAGNOSIS — M5136 Other intervertebral disc degeneration, lumbar region: Secondary | ICD-10-CM | POA: Diagnosis not present

## 2017-03-03 DIAGNOSIS — M9901 Segmental and somatic dysfunction of cervical region: Secondary | ICD-10-CM | POA: Diagnosis not present

## 2017-03-03 DIAGNOSIS — M5030 Other cervical disc degeneration, unspecified cervical region: Secondary | ICD-10-CM | POA: Diagnosis not present

## 2017-03-10 DIAGNOSIS — M5136 Other intervertebral disc degeneration, lumbar region: Secondary | ICD-10-CM | POA: Diagnosis not present

## 2017-03-10 DIAGNOSIS — M9902 Segmental and somatic dysfunction of thoracic region: Secondary | ICD-10-CM | POA: Diagnosis not present

## 2017-03-10 DIAGNOSIS — M9903 Segmental and somatic dysfunction of lumbar region: Secondary | ICD-10-CM | POA: Diagnosis not present

## 2017-03-10 DIAGNOSIS — M9904 Segmental and somatic dysfunction of sacral region: Secondary | ICD-10-CM | POA: Diagnosis not present

## 2017-03-17 DIAGNOSIS — M9902 Segmental and somatic dysfunction of thoracic region: Secondary | ICD-10-CM | POA: Diagnosis not present

## 2017-03-17 DIAGNOSIS — M5136 Other intervertebral disc degeneration, lumbar region: Secondary | ICD-10-CM | POA: Diagnosis not present

## 2017-03-17 DIAGNOSIS — M9904 Segmental and somatic dysfunction of sacral region: Secondary | ICD-10-CM | POA: Diagnosis not present

## 2017-03-17 DIAGNOSIS — M9903 Segmental and somatic dysfunction of lumbar region: Secondary | ICD-10-CM | POA: Diagnosis not present

## 2017-03-21 DIAGNOSIS — M9903 Segmental and somatic dysfunction of lumbar region: Secondary | ICD-10-CM | POA: Diagnosis not present

## 2017-03-21 DIAGNOSIS — M9902 Segmental and somatic dysfunction of thoracic region: Secondary | ICD-10-CM | POA: Diagnosis not present

## 2017-03-21 DIAGNOSIS — M5136 Other intervertebral disc degeneration, lumbar region: Secondary | ICD-10-CM | POA: Diagnosis not present

## 2017-03-21 DIAGNOSIS — M9904 Segmental and somatic dysfunction of sacral region: Secondary | ICD-10-CM | POA: Diagnosis not present

## 2017-04-04 DIAGNOSIS — M5136 Other intervertebral disc degeneration, lumbar region: Secondary | ICD-10-CM | POA: Diagnosis not present

## 2017-04-04 DIAGNOSIS — M9903 Segmental and somatic dysfunction of lumbar region: Secondary | ICD-10-CM | POA: Diagnosis not present

## 2017-04-04 DIAGNOSIS — M9902 Segmental and somatic dysfunction of thoracic region: Secondary | ICD-10-CM | POA: Diagnosis not present

## 2017-04-04 DIAGNOSIS — M9904 Segmental and somatic dysfunction of sacral region: Secondary | ICD-10-CM | POA: Diagnosis not present

## 2017-04-14 DIAGNOSIS — M9904 Segmental and somatic dysfunction of sacral region: Secondary | ICD-10-CM | POA: Diagnosis not present

## 2017-04-14 DIAGNOSIS — M9902 Segmental and somatic dysfunction of thoracic region: Secondary | ICD-10-CM | POA: Diagnosis not present

## 2017-04-14 DIAGNOSIS — M9903 Segmental and somatic dysfunction of lumbar region: Secondary | ICD-10-CM | POA: Diagnosis not present

## 2017-04-14 DIAGNOSIS — M5136 Other intervertebral disc degeneration, lumbar region: Secondary | ICD-10-CM | POA: Diagnosis not present

## 2017-04-18 ENCOUNTER — Ambulatory Visit: Payer: Medicare Other | Admitting: Neurology

## 2017-04-21 DIAGNOSIS — M9902 Segmental and somatic dysfunction of thoracic region: Secondary | ICD-10-CM | POA: Diagnosis not present

## 2017-04-21 DIAGNOSIS — M9904 Segmental and somatic dysfunction of sacral region: Secondary | ICD-10-CM | POA: Diagnosis not present

## 2017-04-21 DIAGNOSIS — M5136 Other intervertebral disc degeneration, lumbar region: Secondary | ICD-10-CM | POA: Diagnosis not present

## 2017-04-21 DIAGNOSIS — M9903 Segmental and somatic dysfunction of lumbar region: Secondary | ICD-10-CM | POA: Diagnosis not present

## 2017-05-02 DIAGNOSIS — M5136 Other intervertebral disc degeneration, lumbar region: Secondary | ICD-10-CM | POA: Diagnosis not present

## 2017-05-02 DIAGNOSIS — M9902 Segmental and somatic dysfunction of thoracic region: Secondary | ICD-10-CM | POA: Diagnosis not present

## 2017-05-02 DIAGNOSIS — M9903 Segmental and somatic dysfunction of lumbar region: Secondary | ICD-10-CM | POA: Diagnosis not present

## 2017-05-02 DIAGNOSIS — M9904 Segmental and somatic dysfunction of sacral region: Secondary | ICD-10-CM | POA: Diagnosis not present

## 2017-05-12 DIAGNOSIS — M9903 Segmental and somatic dysfunction of lumbar region: Secondary | ICD-10-CM | POA: Diagnosis not present

## 2017-05-12 DIAGNOSIS — M9904 Segmental and somatic dysfunction of sacral region: Secondary | ICD-10-CM | POA: Diagnosis not present

## 2017-05-12 DIAGNOSIS — M5136 Other intervertebral disc degeneration, lumbar region: Secondary | ICD-10-CM | POA: Diagnosis not present

## 2017-05-12 DIAGNOSIS — M9902 Segmental and somatic dysfunction of thoracic region: Secondary | ICD-10-CM | POA: Diagnosis not present

## 2017-05-22 DIAGNOSIS — E559 Vitamin D deficiency, unspecified: Secondary | ICD-10-CM | POA: Diagnosis not present

## 2017-05-22 DIAGNOSIS — E038 Other specified hypothyroidism: Secondary | ICD-10-CM | POA: Diagnosis not present

## 2017-05-22 DIAGNOSIS — E7849 Other hyperlipidemia: Secondary | ICD-10-CM | POA: Diagnosis not present

## 2017-05-22 DIAGNOSIS — I10 Essential (primary) hypertension: Secondary | ICD-10-CM | POA: Diagnosis not present

## 2017-05-23 DIAGNOSIS — M9902 Segmental and somatic dysfunction of thoracic region: Secondary | ICD-10-CM | POA: Diagnosis not present

## 2017-05-23 DIAGNOSIS — M9904 Segmental and somatic dysfunction of sacral region: Secondary | ICD-10-CM | POA: Diagnosis not present

## 2017-05-23 DIAGNOSIS — M5136 Other intervertebral disc degeneration, lumbar region: Secondary | ICD-10-CM | POA: Diagnosis not present

## 2017-05-23 DIAGNOSIS — M9903 Segmental and somatic dysfunction of lumbar region: Secondary | ICD-10-CM | POA: Diagnosis not present

## 2017-05-29 ENCOUNTER — Other Ambulatory Visit: Payer: Self-pay | Admitting: Internal Medicine

## 2017-05-29 DIAGNOSIS — E038 Other specified hypothyroidism: Secondary | ICD-10-CM | POA: Diagnosis not present

## 2017-05-29 DIAGNOSIS — Z0000 Encounter for general adult medical examination without abnormal findings: Secondary | ICD-10-CM | POA: Diagnosis not present

## 2017-05-29 DIAGNOSIS — M810 Age-related osteoporosis without current pathological fracture: Secondary | ICD-10-CM

## 2017-05-29 DIAGNOSIS — I10 Essential (primary) hypertension: Secondary | ICD-10-CM | POA: Diagnosis not present

## 2017-05-29 DIAGNOSIS — E7849 Other hyperlipidemia: Secondary | ICD-10-CM | POA: Diagnosis not present

## 2017-06-02 ENCOUNTER — Inpatient Hospital Stay
Admission: RE | Admit: 2017-06-02 | Discharge: 2017-06-02 | Disposition: A | Discharge status: Home / Self Care | Payer: Medicare Other | Source: Ambulatory Visit | Attending: Internal Medicine | Admitting: Internal Medicine

## 2017-06-02 DIAGNOSIS — M9904 Segmental and somatic dysfunction of sacral region: Secondary | ICD-10-CM | POA: Diagnosis not present

## 2017-06-02 DIAGNOSIS — M9903 Segmental and somatic dysfunction of lumbar region: Secondary | ICD-10-CM | POA: Diagnosis not present

## 2017-06-02 DIAGNOSIS — M5136 Other intervertebral disc degeneration, lumbar region: Secondary | ICD-10-CM | POA: Diagnosis not present

## 2017-06-02 DIAGNOSIS — M9902 Segmental and somatic dysfunction of thoracic region: Secondary | ICD-10-CM | POA: Diagnosis not present

## 2017-06-06 ENCOUNTER — Other Ambulatory Visit: Payer: Medicare Other

## 2017-06-11 DIAGNOSIS — M5136 Other intervertebral disc degeneration, lumbar region: Secondary | ICD-10-CM | POA: Diagnosis not present

## 2017-06-11 DIAGNOSIS — M9903 Segmental and somatic dysfunction of lumbar region: Secondary | ICD-10-CM | POA: Diagnosis not present

## 2017-06-11 DIAGNOSIS — M9902 Segmental and somatic dysfunction of thoracic region: Secondary | ICD-10-CM | POA: Diagnosis not present

## 2017-06-11 DIAGNOSIS — M9904 Segmental and somatic dysfunction of sacral region: Secondary | ICD-10-CM | POA: Diagnosis not present

## 2017-06-23 DIAGNOSIS — M9904 Segmental and somatic dysfunction of sacral region: Secondary | ICD-10-CM | POA: Diagnosis not present

## 2017-06-23 DIAGNOSIS — M5136 Other intervertebral disc degeneration, lumbar region: Secondary | ICD-10-CM | POA: Diagnosis not present

## 2017-06-23 DIAGNOSIS — M9903 Segmental and somatic dysfunction of lumbar region: Secondary | ICD-10-CM | POA: Diagnosis not present

## 2017-06-23 DIAGNOSIS — M9902 Segmental and somatic dysfunction of thoracic region: Secondary | ICD-10-CM | POA: Diagnosis not present

## 2017-06-27 ENCOUNTER — Inpatient Hospital Stay
Admission: RE | Admit: 2017-06-27 | Discharge: 2017-06-27 | Disposition: A | Discharge status: Home / Self Care | Payer: Medicare Other | Source: Ambulatory Visit | Attending: Internal Medicine | Admitting: Internal Medicine

## 2017-06-27 ENCOUNTER — Other Ambulatory Visit: Payer: Self-pay | Admitting: Internal Medicine

## 2017-06-27 DIAGNOSIS — Z1231 Encounter for screening mammogram for malignant neoplasm of breast: Secondary | ICD-10-CM

## 2017-06-30 DIAGNOSIS — M9901 Segmental and somatic dysfunction of cervical region: Secondary | ICD-10-CM | POA: Diagnosis not present

## 2017-06-30 DIAGNOSIS — M5030 Other cervical disc degeneration, unspecified cervical region: Secondary | ICD-10-CM | POA: Diagnosis not present

## 2017-06-30 DIAGNOSIS — M5136 Other intervertebral disc degeneration, lumbar region: Secondary | ICD-10-CM | POA: Diagnosis not present

## 2017-06-30 DIAGNOSIS — M9903 Segmental and somatic dysfunction of lumbar region: Secondary | ICD-10-CM | POA: Diagnosis not present

## 2017-07-23 ENCOUNTER — Ambulatory Visit: Payer: Medicare Other | Admitting: Neurology

## 2017-07-25 DIAGNOSIS — M5030 Other cervical disc degeneration, unspecified cervical region: Secondary | ICD-10-CM | POA: Diagnosis not present

## 2017-07-25 DIAGNOSIS — M9903 Segmental and somatic dysfunction of lumbar region: Secondary | ICD-10-CM | POA: Diagnosis not present

## 2017-07-25 DIAGNOSIS — M5136 Other intervertebral disc degeneration, lumbar region: Secondary | ICD-10-CM | POA: Diagnosis not present

## 2017-07-25 DIAGNOSIS — M9901 Segmental and somatic dysfunction of cervical region: Secondary | ICD-10-CM | POA: Diagnosis not present

## 2017-07-28 ENCOUNTER — Ambulatory Visit
Admission: RE | Admit: 2017-07-28 | Discharge: 2017-07-28 | Disposition: A | Discharge status: Home / Self Care | Payer: Medicare Other | Source: Ambulatory Visit | Attending: Internal Medicine | Admitting: Internal Medicine

## 2017-07-28 DIAGNOSIS — Z1231 Encounter for screening mammogram for malignant neoplasm of breast: Secondary | ICD-10-CM

## 2017-08-04 DIAGNOSIS — M5136 Other intervertebral disc degeneration, lumbar region: Secondary | ICD-10-CM | POA: Diagnosis not present

## 2017-08-04 DIAGNOSIS — M9901 Segmental and somatic dysfunction of cervical region: Secondary | ICD-10-CM | POA: Diagnosis not present

## 2017-08-04 DIAGNOSIS — M9903 Segmental and somatic dysfunction of lumbar region: Secondary | ICD-10-CM | POA: Diagnosis not present

## 2017-08-04 DIAGNOSIS — M5030 Other cervical disc degeneration, unspecified cervical region: Secondary | ICD-10-CM | POA: Diagnosis not present

## 2017-08-05 ENCOUNTER — Other Ambulatory Visit: Payer: Self-pay | Admitting: Neurology

## 2017-08-11 DIAGNOSIS — M9903 Segmental and somatic dysfunction of lumbar region: Secondary | ICD-10-CM | POA: Diagnosis not present

## 2017-08-11 DIAGNOSIS — M9901 Segmental and somatic dysfunction of cervical region: Secondary | ICD-10-CM | POA: Diagnosis not present

## 2017-08-11 DIAGNOSIS — M5136 Other intervertebral disc degeneration, lumbar region: Secondary | ICD-10-CM | POA: Diagnosis not present

## 2017-08-11 DIAGNOSIS — M5030 Other cervical disc degeneration, unspecified cervical region: Secondary | ICD-10-CM | POA: Diagnosis not present

## 2017-08-18 DIAGNOSIS — M5136 Other intervertebral disc degeneration, lumbar region: Secondary | ICD-10-CM | POA: Diagnosis not present

## 2017-08-18 DIAGNOSIS — M9903 Segmental and somatic dysfunction of lumbar region: Secondary | ICD-10-CM | POA: Diagnosis not present

## 2017-08-18 DIAGNOSIS — M5030 Other cervical disc degeneration, unspecified cervical region: Secondary | ICD-10-CM | POA: Diagnosis not present

## 2017-08-18 DIAGNOSIS — M9901 Segmental and somatic dysfunction of cervical region: Secondary | ICD-10-CM | POA: Diagnosis not present

## 2017-08-25 DIAGNOSIS — M5030 Other cervical disc degeneration, unspecified cervical region: Secondary | ICD-10-CM | POA: Diagnosis not present

## 2017-08-25 DIAGNOSIS — M9901 Segmental and somatic dysfunction of cervical region: Secondary | ICD-10-CM | POA: Diagnosis not present

## 2017-08-25 DIAGNOSIS — M5136 Other intervertebral disc degeneration, lumbar region: Secondary | ICD-10-CM | POA: Diagnosis not present

## 2017-08-25 DIAGNOSIS — M9903 Segmental and somatic dysfunction of lumbar region: Secondary | ICD-10-CM | POA: Diagnosis not present

## 2017-09-10 DIAGNOSIS — M9903 Segmental and somatic dysfunction of lumbar region: Secondary | ICD-10-CM | POA: Diagnosis not present

## 2017-09-10 DIAGNOSIS — M5030 Other cervical disc degeneration, unspecified cervical region: Secondary | ICD-10-CM | POA: Diagnosis not present

## 2017-09-10 DIAGNOSIS — M9901 Segmental and somatic dysfunction of cervical region: Secondary | ICD-10-CM | POA: Diagnosis not present

## 2017-09-10 DIAGNOSIS — M5136 Other intervertebral disc degeneration, lumbar region: Secondary | ICD-10-CM | POA: Diagnosis not present

## 2017-09-15 DIAGNOSIS — M5030 Other cervical disc degeneration, unspecified cervical region: Secondary | ICD-10-CM | POA: Diagnosis not present

## 2017-09-15 DIAGNOSIS — M9903 Segmental and somatic dysfunction of lumbar region: Secondary | ICD-10-CM | POA: Diagnosis not present

## 2017-09-15 DIAGNOSIS — M5136 Other intervertebral disc degeneration, lumbar region: Secondary | ICD-10-CM | POA: Diagnosis not present

## 2017-09-15 DIAGNOSIS — M9901 Segmental and somatic dysfunction of cervical region: Secondary | ICD-10-CM | POA: Diagnosis not present

## 2017-09-18 ENCOUNTER — Ambulatory Visit: Payer: Medicare Other | Admitting: Neurology

## 2017-09-22 ENCOUNTER — Other Ambulatory Visit: Payer: Self-pay | Admitting: Neurology

## 2017-09-22 DIAGNOSIS — M9901 Segmental and somatic dysfunction of cervical region: Secondary | ICD-10-CM | POA: Diagnosis not present

## 2017-09-22 DIAGNOSIS — M5136 Other intervertebral disc degeneration, lumbar region: Secondary | ICD-10-CM | POA: Diagnosis not present

## 2017-09-22 DIAGNOSIS — M9903 Segmental and somatic dysfunction of lumbar region: Secondary | ICD-10-CM | POA: Diagnosis not present

## 2017-09-22 DIAGNOSIS — M5030 Other cervical disc degeneration, unspecified cervical region: Secondary | ICD-10-CM | POA: Diagnosis not present

## 2017-09-29 DIAGNOSIS — M5030 Other cervical disc degeneration, unspecified cervical region: Secondary | ICD-10-CM | POA: Diagnosis not present

## 2017-09-29 DIAGNOSIS — M9903 Segmental and somatic dysfunction of lumbar region: Secondary | ICD-10-CM | POA: Diagnosis not present

## 2017-09-29 DIAGNOSIS — M9901 Segmental and somatic dysfunction of cervical region: Secondary | ICD-10-CM | POA: Diagnosis not present

## 2017-09-29 DIAGNOSIS — M5136 Other intervertebral disc degeneration, lumbar region: Secondary | ICD-10-CM | POA: Diagnosis not present

## 2017-10-06 NOTE — Progress Notes (Signed)
bSubjective:   Rachel [name] was seen in consultation in the movement disorder clinic at the request of Marton Redwood, MD.  The evaluation is for tremor.  The patient is a 67 y.o. right handed female with a history of tremor.  The pt reports that she first noted shaking in the legs and arms bilaterally about 1 - 1.5 years ago.  It was intermittent.  She now notices tremor of the hands, legs and head.  She has better and worse days.  She notes that lying in the recliner helps.  She notes that walking and exercise makes it worse.  The L leg feels that it will stiffen up and cause the tremor.  There is a family hx of tremor in her mother but it was undiagnosed.    Affected by caffeine:  yes (quit drinking caffeine all together) Exacerbated by albuterol:  Yes Affected by alcohol:  unknown Affected by stress:  yes Affected by fatigue:  yes Spills soup if on spoon:  yes Spills glass of liquid if full:  yes Affects ADL's (tying shoes, brushing teeth, etc):  no Affects ability to put on makeup:  Yes  09/1813 update:  The patient is following up today regarding tremor.  She is accompanied by her spouse, who supplements the history.  I have not seen the patient since her first appointment in August, 2014.  At that point in time, she did not want any medication for the tremor.  She did have an MRI of the brain in August, 2014 that was normal.  I did review this.  Over the course of time, she has noted tremor in the L foot and hand.  She states that if she uses the hands, her L leg will stiffen and will eventually hurt.  Minimal neck pain.  Has back pain but thinks that is independent.  Her bladder/bowel work okay.  Tremor is much better in the hands, L more than R, but it is better now that she is taking herbs that her chiropractor gave her.  She is receiving kinesiology treatment.  She feels that her walking is "awkward" because of the foot.  She has trouble with arm swing.  She reports some shakiness with  handwriting (R hand dominant) but no micrographia.  She is sleeping well.  She has no vivid dreams.  No sleep walking/talking.  No diplopia.  Does have hx of scintillating scotomas that is sometimes associated with migraine.  Denies swallowing problems.  No speech changes per pt but husband states that sometimes pt inserts one word for another or has word finding trouble.  Has "no smell" but able to taste, but thinks that there is some loss because of not being able to taste food.  She admits that she has been researching various causes of her symptoms, including Parkinson's and dystonias.  She requests a DaT scan.  11/19/13 update:  The patient is following up today, accompanied by her husband who helps to supplement the history.  Since our last visit, the patient did undergo a DaT scan.  No films are available.  However, I did get the report.  There was asymmetric uptake, with no uptake in the right putamen and normal on the left.  Pt opted to hold on the MRIs ordered last visit.  We did start carbidopa/levodopa last visit and the patient states that it is about 30% beneficial for her symptoms.   03/21/14 update:  This patient is accompanied in the office by her spouse who supplements  the history.  Last visit, I increased the patients carbidopa/levodopa 25/100 to qid but she hasn't really need to do that.  She is overall doing much better.  I did send her for PT/OT and that has helped.   She has had some constipation and been on miralax for that.  She states that she had severe "brain fog" yesterday and thinks that it is related to the levodopa.  She was driving and was confused about where she was.  She has had some other minor episodes prior to that of feeling confused but nothing like that.  She states that her chiropracter gave her "brain medicine" (2 of them, one she calls fito-brain B12) and she states that medicine allowed her arm to swing. She sees her chiropractor daily now and is very pleased with her  services.  The levodopa did help so her leg no longer tremors.   She does think that she has had her thyroid checked this year and states that her synthroid was increased last visit but cannot remember when that was.    07/11/14 update:  The patient is following up today, accompanied by her husband who supplements the history.  The patient is currently on carbidopa/levodopa 25/100, 4 times per day (went back up from 3 times per day because of "jerking" and foot dystonia).  I reviewed records since her last visit.  She was in her wife for vacation in October when she called me.  She stated that she initially had a scratchy throat and then suddenly she was not able to taste.  This sounded viral to me, but I told her that nonetheless it was not related to [place], as symptoms do not come on that acutely.  I told her that she could follow up with a physician in [place].   She states that it took 6-8 weeks to go away, but her husband got the same thing.   She is not falling currently but thinks that her balance is really not good.  She cannot remember the last PT date.  She still has trouble with her memory.  That is her biggest c/o.  She is having word finding trouble.    12/02/14 update:  The patient is f/u today.  She is on carbidopa/levodopa 25/100 four times a day.  Her sister in law died a few weeks ago after complications from a fall.  This was unexpected.  She was only in her 54's.  She went for neuropsych testing since our last visit.  No evidence of dementia.  Evidence of adjustment disorder with mixed anxiety/depression and felt that she may benefit from brief trial of psychotherapy.  Pt states that she is doing well in that regard and does not think she needs the therapy right now and she did start some physical therapy since last visit, but she states that she did not end up completing it because something else came up, but feels that she has been doing well.  Her left foot only turns and when she is very  tired.  She has diplopia when looking left.  She has an appt with neuro-opth (Dr. Hassell Done) at the end of may.    04/13/15 update:  The patient is following up today regarding her MSA.  She is carbidopa/levodopa 25/100 25/100, 1 tablet 4 times per day.  Overall, the patient reports that she is doing well.  She has gotten a new job at the [employer] since our last visit.  She is working at  the corporate office as a receptionist.  She is having to multitask which is good for her.  Since her last visit, she did see Dr. Sanda Klein at [facility] in regards to her diplopia.  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  He said that the diplopia is not a component directly of MSA, but rather perhaps a loss of a compensatory ability secondary to [place].  He did not know why the symptoms were just with left gaze as the same dissociation occurred in both right and left gaze.  The patient was not interested in surgery, but he did recommend prisms and the patient was going to follow-up with Dr. Ellie Lunch at [facility] ophthalmology to discuss that further.  She has a temporary prism now and she can drive again now with it and she loves that.  She asks me about a permanent license plate (handicap) instead of the placard as she forgets to hang the placard sometimes.  She has had no falls but she has to be very careful.  She runs into walls a lot.  She is doing okay with swallowing (nothing worse than usual per pt).  She is walking and riding her bike for exercise.  If she doesn't her L leg will stiffen up.  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  She is on carbidopa/levodopa 25/100, 1 tablet 4 times per day.  She has had more tremor of the L leg and "it just drives me crazy."  She has had more constipation and miralax became less effective.  She has not had any falls since last visit.  No hallucinations.  No lightheadedness or near syncope.   Physical therapy notes indicate that the patient canceled multiple visits and ultimately was discharged because of that.  She states that at the time she felt that she didn't need it but now she has gotten weaker.  She has backed down on the supplements because she was getting sick; she is just taking Calium, D3 and still taking the "dopatone."  She is also very tired because she is not sleeping well.  10/12/15 update:  Pt f/u today re: MSA.  She is on carbidopa/levodopa 25/100 four times a day (9am/2:30pm/7pm/11pm) and I did recommend that she stop her dopatone supplement.  She is still on this but might take it every other day.  She is taking other supplements as well ("glutathione recycler").  She does state that her chiropractor gave her a different supplement and told her to d/c dopatone and told her to take [facility] glutathione and "it made a great difference."  She is taking it a bit after the meal times.  She was given the rancho recipe for constipation and I recommended that she start melatonin for insomnia.  She didn't try the rancho recipe because bran had gluten and gluten gives her headaches so she mixes prunes juice with miralax and uses a laxative tea.  She reports today that she has not had any falls.  She has not had any hallucinations or near syncope.  She was sent for PT at last visit but then refused therapy when contacted by them.  She is not exercising regularly but is occasionally riding her bike.  She works 4.5 daily.  02/08/16 update:  Pt f/u today re: MSA.  This patient is accompanied in the office by her spouse who supplements the history.  She was on carbidopa/levodopa 25/100 four times a day  last visit.  She called after our last visit and stated that she felt somewhat more stiff and I told her she could try an extra half tablet as needed.  She states that she has increased it to one tablet 5 times a day.  Feels that she has been weaker.  L foot been moving more and "drives me crazy."   She  has been using the indoor bike.  She denies any falls but she is having near falls.  Asks for RX for walker.    She denies any lightheadedness or near syncope.  No hallucinations.  Mood has been good per pt and husband.    Using melatonin for insomnia.  03/21/16 update:  Patient is seen today.  She remains on carbidopa/levodopa 25/100, 1 tablet 5 times per day.  Last visit, I started her on amantadine, 100 mg twice a day for dyskinesia.  She states that it helped the dyskinesia, but she thought it caused headache.  This would be an unusual side effect, but ended up calling her in a Medrol Dosepak.  She states that never even had to fill that RX because the headaches went away and she does think that it was allergy related.  She came today because her muscles are painful and hurting.  All month she has been struggling to even get off the couch to walk.  Her muscles are burning when they walk but not when she sits.  She has not had any falls since last visit. Last labs were 6-8 months ago.  I did give her a prescription for a Rollator a few weeks ago and she states that she has not gotten that yet.  She has not had any hallucinations.  No lightheadedness or near syncope.  Is having issues with constipation.  Tried rancho recipe.  On linzess.  4 colace per day without relief.  Even had n/v because of it.  Having trouble getting eyeliner on due to tremor.  06/13/16 update:  Patient seen in follow-up today.  She is on carbidopa/levodopa 25/100, one tablet 5 times per day.  She is on amantadine, 100 mg twice per day.  Pt denies falls but she has gotten close.  She has a rollator but doesn't like to use it at work.  Pt denies lightheadedness, near syncope.  No hallucinations.  Mood has been good.  She has had intermittent nausea for which Dr. Brigitte Pulse had been prescribing Zofran.  She called here and wanted me to prescribe that medication, but I told her I would prefer that come from the original prescriber.  States that  she has a routine now and she takes medication slowly in the AM and that helped her stomach.  I gave her a bowel purge recipe last visit and she states that it worked but she also states that she is using linzess and that has really helped.  I gave her samples of lyrica last visit for paresthesias and diffuse muscle pain but she read the package insert and she was too scared to try it.    12/04/16 update:  Patient seen in follow-up today.  This patient is accompanied in the office by her spouse who supplements the history.  She is on carbidopa/levodopa 25/100, one tablet 5 times per day and occasionally will take 6 times per day.  She is on amantadine, 100 mg twice per day.   She fell last month while walking the dog.  She was going off the sidewalk and the walker tipped  as the dog pulled her a little and she had a hairline fx in the hip.  She is seeing Dr. Maureen Ralphs and told no surgery but has to stay on the walker full time for 2 weeks and then can go back to the cane.  Having some low BP's that make her feel near syncopal  10/08/17 update: Patient is seen today in follow-up.  She remains on carbidopa/levodopa 25/100, 1 tablet 6 times per day (sometimes the 6th dose is in the middle of the night) and amantadine, 100 mg twice per day.  "If I take my medication on time every 2 hours, it works well."  Reviewed labs after calling PCP for them.  No more recent labs than had already had 05/22/17. Does state that she quit working due to trouble typing and word finding trouble.  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  She is only exercising intermittently.  Some occasional tightness and pain in the L leg.  Some L knee pain and has chronic back pain.  Occasionally will feel lightheaded and on those days, will use the walker.  Usually stable with the cane.  Not drinking much water.   Current/Previously tried tremor medications: tried propranolol on one occasion but seemed to make her sick and she  did not want to take the next pill  Current medications that may exacerbate tremor:  proair  Outside reports reviewed: historical medical records.  Allergies  Allergen Reactions  . Alendronate Other (See Comments)    headache  . Symbicort [Budesonide-Formoterol Fumarate] Other (See Comments)    Shakes and muscle weakness    Current Outpatient Medications on File Prior to Visit  Medication Sig Dispense Refill  . amantadine (SYMMETREL) 100 MG capsule TAKE 1 CAPSULE BY MOUTH TWICE DAILY 60 capsule 0  . CALCIUM LACTATE PO Take 6 tablets by mouth daily.    . carbidopa-levodopa (SINEMET IR) 25-100 MG tablet TAKE 1 TABLET BY MOUTH SIX TIMES DAILY 540 tablet 0  . Cholecalciferol (VITAMIN D-3 PO) Take 3 tablets by mouth daily.    . citalopram (CELEXA) 20 MG tablet Take 20 mg by mouth daily.     Marland Kitchen levothyroxine (SYNTHROID, LEVOTHROID) 88 MCG tablet Take 88 mcg by mouth at bedtime.    Marland Kitchen linaclotide (LINZESS) 72 MCG capsule Take 72 mcg by mouth 4 (four) times daily.    . Multiple Vitamin (MULTIVITAMIN WITH MINERALS) TABS tablet Take 2 tablets by mouth daily.    Marland Kitchen omeprazole (PRILOSEC OTC) 20 MG tablet Take 20 mg by mouth daily before breakfast.    . ondansetron (ZOFRAN) 4 MG tablet Take 4 mg by mouth as needed for nausea or vomiting.    Marland Kitchen OVER THE COUNTER MEDICATION Phyto Brain    . VOLTAREN 1 % GEL      No current facility-administered medications on file prior to visit.     Past Medical History:  Diagnosis Date  . Anemia   . Arthritis   . Asthma   . Blood clot in vein  age 89   left leg  . GERD (gastroesophageal reflux disease)   . Hyperlipidemia   . Hypertension    pt denies, in physician record  . Hypothyroidism   . Multiple pregnancy loss, not currently pregnant    multiple miscarriages-pt.unsure of #  . Multiple system atrophy ([place]) 2015  . Nephrolithiasis    hx of kidney stones, last 6 months ago  . Osteopenia   . Tendinitis  left foot    Past Surgical History:   Procedure Laterality Date  . BREAST EXCISIONAL BIOPSY Right   . BREAST SURGERY     rt. lumpectomy, benign  . colonscopy  2011  . cystoscopy  2012  . JOINT REPLACEMENT  05-2007   right knee replacment/revision  . KIDNEY STONE SURGERY    . REPLACEMENT TOTAL KNEE Left 06/05/12  . TOTAL KNEE REVISION  06/03/2012   Procedure: TOTAL KNEE REVISION;  Surgeon: Gearlean Alf, MD;  Location: [facility];  Service: Orthopedics;  Laterality: Left;  Revision of a Left Uni Knee to a Total Knee Arthroplasty    Social History   Socioeconomic History  . Marital status: Married    Spouse name: Not on file  . Number of children: Not on file  . Years of education: Not on file  . Highest education level: Not on file  Social Needs  . Financial resource strain: Not on file  . Food insecurity - worry: Not on file  . Food insecurity - inability: Not on file  . Transportation needs - medical: Not on file  . Transportation needs - non-medical: Not on file  Occupational History  . Occupation: retired    Fish farm manager: Patent attorney    Comment: receptionist  Tobacco Use  . Smoking status: Never Smoker  . Smokeless tobacco: Never Used  Substance and Sexual Activity  . Alcohol use: No    Alcohol/week: 0.0 oz  . Drug use: No  . Sexual activity: No    Partners: Male    Birth control/protection: Post-menopausal  Other Topics Concern  . Not on file  Social History Narrative  . Not on file    Family Status  Relation Name Status  . Mother  Deceased       fall, death during surgery  . Father  Deceased       ? heart  . Brother  Alive       healthy  . Sister  Alive       2, asthma, heart problem  . Child  Alive       adopted, healthy  . Sister  (Not Specified)    Review of Systems Has lost weight but is trying.  A complete 10 system ROS was obtained and was negative apart from what is mentioned.   Objective:   VITALS:   Vitals:   10/08/17 1114  SpO2: 92%  Weight: 218 lb (98.9 kg)  Height: 5'  6" (1.676 m)   Wt Readings from Last 3 Encounters:  10/08/17 218 lb (98.9 kg)  12/04/16 198 lb (89.8 kg)  06/13/16 215 lb (97.5 kg)   Orthostatic VS for the past 24 hrs (Last 3 readings):  BP- Lying Pulse- Lying BP- Sitting Pulse- Sitting BP- Standing at 0 minutes Pulse- Standing at 0 minutes  10/08/17 1115 124/86 80 120/82 80 108/70 82     Gen:  Appears stated age and in NAD. HEENT:  Normocephalic, atraumatic. The mucous membranes are moist. The superficial temporal arteries are without ropiness or tenderness. Cardiovascular: Regular rate and rhythm. Lungs: Clear to auscultation bilaterally. Neck: There are no carotid bruits noted bilaterally.  NEUROLOGICAL:  Orientation:  The patient is alert and oriented x 3.   Cranial nerves: There is good facial symmetry.  Extraocular muscles are intact and visual fields are full to confrontational testing. Speech is fluent and clear. Soft palate rises symmetrically and there is no tongue deviation. Hearing is intact to conversational tone. Tone: Tone  is mild increased tone in left upper extremity. Coordination:  The patient has good rapid alternating movements today Motor: Strength is 5/5 in the bilateral upper and lower extremities.  Shoulder shrug is equal bilaterally.  There is no pronator drift.  There are no fasciculations noted. Gait and Station: The patient pushes off the chair to arise.  She walks very well with her cane.  She does not shuffle.  Her gait is nonantalgic.  MOVEMENT EXAM: Tremor:   There is no tremor.  There is no dyskinesia today  Labs:  Received labs dated 05/22/17.  Sodium was 142, potassium 4.0, chloride 102, CO2 31, BUN 20 and creatinine 1.0.  White blood cells were 4.3, hemoglobin 13.8, hematocrit 41.3 and platelets 266.  TSH just slightly elevated at 4.54.   Assessment/Plan:   1.  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  She and I talked about the diagnosis.  She will continue on carbidopa/levodopa  25/100 6 times a day.  She looks good on this medication.    -Amantadine 100 mg bid has helped dyskinesia  -Invited her to the atypical parkinsonian support group.  Discussed the benefits of this, and she is very excited about the group.  She has several questions and I answered them to the best of my ability.  She also met with our social worker today, who runs the support group.   2.  Cognitive impairment  -As above, she had neuropsych testing done in January, 2016 and there was no evidence of dementia.  There was some evidence of mixed anxiety/depression and a brief trial of psychotherapy was recommended, but the patient felt that she really did not need that.  Feels that she still is having issues but doesn't wish to pursue counseling.    3.  Diplopia  -Pt saw Dr. Sanda Klein at [facility] in regards to her diplopia.  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  He said that the diplopia is not a component directly of MSA, but rather perhaps a loss of a compensatory ability secondary to [place].  She has a temporary prism now and it has really helped.  4.  Low back pain  -do MRI lumbar spine to make sure that leg pain isn't all MSA stiffness.  Last MRI in 2016 after fx.    5.  Insomnia  -using melatonin, 3 mg and states that it is working  6.  Orthostatic hypotension  -Discussed with her the importance once again of increasing water intake.  Her blood pressure did drop with orthostatics today, but her pulse did not change.  7.  Follow up is anticipated in the next few months, sooner should new neurologic issues arise.  Much greater than 50% of this visit was spent in counseling and coordinating care.  Total face to face time:  25 min

## 2017-10-08 ENCOUNTER — Encounter: Payer: Self-pay | Admitting: Neurology

## 2017-10-08 ENCOUNTER — Ambulatory Visit: Payer: Medicare Other | Admitting: Neurology

## 2017-10-08 VITALS — Ht 66.0 in | Wt 218.0 lb

## 2017-10-08 DIAGNOSIS — G8929 Other chronic pain: Secondary | ICD-10-CM | POA: Diagnosis not present

## 2017-10-08 DIAGNOSIS — M5442 Lumbago with sciatica, left side: Secondary | ICD-10-CM | POA: Diagnosis not present

## 2017-10-08 DIAGNOSIS — G232 Striatonigral degeneration: Secondary | ICD-10-CM

## 2017-10-08 NOTE — Addendum Note (Signed)
Addended byAnnamaria Helling on: 10/08/2017 12:09 PM   Modules accepted: Orders

## 2017-10-08 NOTE — Patient Instructions (Signed)
Atypical Parkinsonian Support Group   Support groups offer companionship and support through a very difficult journey, and are greatly beneficial for patients, as well as caregivers and care partners. Please consider this group if you are living with, or caring for someone with PSP, CBD, or MSA.                                                                                                                                               When:  The third Wednesday of the month                                                                                                                                             2019 Dates: 1/16, 2/20, 3/20, 4/17, 5/15, 6/19, 7/17, 8/21, 9/18, 10/16, 11/20, 12/18  Location:                                                                                                                             [facility] (located in [employer] of Main Building)                                                  [address]  [place], [place] [address]                                                                                                                                                           Time: 2-3:30   Interested in attending or have questions:                                                                      call Myra Gianotti, LCSW at [phone]

## 2017-10-13 DIAGNOSIS — M9901 Segmental and somatic dysfunction of cervical region: Secondary | ICD-10-CM | POA: Diagnosis not present

## 2017-10-13 DIAGNOSIS — M5030 Other cervical disc degeneration, unspecified cervical region: Secondary | ICD-10-CM | POA: Diagnosis not present

## 2017-10-13 DIAGNOSIS — M9903 Segmental and somatic dysfunction of lumbar region: Secondary | ICD-10-CM | POA: Diagnosis not present

## 2017-10-13 DIAGNOSIS — H5203 Hypermetropia, bilateral: Secondary | ICD-10-CM | POA: Diagnosis not present

## 2017-10-13 DIAGNOSIS — M5136 Other intervertebral disc degeneration, lumbar region: Secondary | ICD-10-CM | POA: Diagnosis not present

## 2017-10-29 DIAGNOSIS — M5030 Other cervical disc degeneration, unspecified cervical region: Secondary | ICD-10-CM | POA: Diagnosis not present

## 2017-10-29 DIAGNOSIS — M9903 Segmental and somatic dysfunction of lumbar region: Secondary | ICD-10-CM | POA: Diagnosis not present

## 2017-10-29 DIAGNOSIS — M9901 Segmental and somatic dysfunction of cervical region: Secondary | ICD-10-CM | POA: Diagnosis not present

## 2017-10-29 DIAGNOSIS — M5136 Other intervertebral disc degeneration, lumbar region: Secondary | ICD-10-CM | POA: Diagnosis not present

## 2017-10-30 DIAGNOSIS — Z6837 Body mass index (BMI) 37.0-37.9, adult: Secondary | ICD-10-CM | POA: Diagnosis not present

## 2017-10-30 DIAGNOSIS — I7389 Other specified peripheral vascular diseases: Secondary | ICD-10-CM | POA: Diagnosis not present

## 2017-10-30 DIAGNOSIS — L03116 Cellulitis of left lower limb: Secondary | ICD-10-CM | POA: Diagnosis not present

## 2017-10-30 DIAGNOSIS — I8310 Varicose veins of unspecified lower extremity with inflammation: Secondary | ICD-10-CM | POA: Diagnosis not present

## 2017-11-04 DIAGNOSIS — M9903 Segmental and somatic dysfunction of lumbar region: Secondary | ICD-10-CM | POA: Diagnosis not present

## 2017-11-04 DIAGNOSIS — M9901 Segmental and somatic dysfunction of cervical region: Secondary | ICD-10-CM | POA: Diagnosis not present

## 2017-11-04 DIAGNOSIS — M5030 Other cervical disc degeneration, unspecified cervical region: Secondary | ICD-10-CM | POA: Diagnosis not present

## 2017-11-04 DIAGNOSIS — M5136 Other intervertebral disc degeneration, lumbar region: Secondary | ICD-10-CM | POA: Diagnosis not present

## 2017-11-06 DIAGNOSIS — Z6837 Body mass index (BMI) 37.0-37.9, adult: Secondary | ICD-10-CM | POA: Diagnosis not present

## 2017-11-06 DIAGNOSIS — I739 Peripheral vascular disease, unspecified: Secondary | ICD-10-CM | POA: Diagnosis not present

## 2017-11-06 DIAGNOSIS — L03116 Cellulitis of left lower limb: Secondary | ICD-10-CM | POA: Diagnosis not present

## 2017-11-06 DIAGNOSIS — I8310 Varicose veins of unspecified lower extremity with inflammation: Secondary | ICD-10-CM | POA: Diagnosis not present

## 2017-11-10 DIAGNOSIS — M5030 Other cervical disc degeneration, unspecified cervical region: Secondary | ICD-10-CM | POA: Diagnosis not present

## 2017-11-10 DIAGNOSIS — M9901 Segmental and somatic dysfunction of cervical region: Secondary | ICD-10-CM | POA: Diagnosis not present

## 2017-11-10 DIAGNOSIS — M5136 Other intervertebral disc degeneration, lumbar region: Secondary | ICD-10-CM | POA: Diagnosis not present

## 2017-11-10 DIAGNOSIS — M9903 Segmental and somatic dysfunction of lumbar region: Secondary | ICD-10-CM | POA: Diagnosis not present

## 2017-11-12 ENCOUNTER — Other Ambulatory Visit: Payer: Self-pay | Admitting: Neurology

## 2017-11-17 DIAGNOSIS — M9901 Segmental and somatic dysfunction of cervical region: Secondary | ICD-10-CM | POA: Diagnosis not present

## 2017-11-17 DIAGNOSIS — M9903 Segmental and somatic dysfunction of lumbar region: Secondary | ICD-10-CM | POA: Diagnosis not present

## 2017-11-17 DIAGNOSIS — M5030 Other cervical disc degeneration, unspecified cervical region: Secondary | ICD-10-CM | POA: Diagnosis not present

## 2017-11-17 DIAGNOSIS — M5136 Other intervertebral disc degeneration, lumbar region: Secondary | ICD-10-CM | POA: Diagnosis not present

## 2017-11-19 DIAGNOSIS — L03116 Cellulitis of left lower limb: Secondary | ICD-10-CM | POA: Diagnosis not present

## 2017-11-19 DIAGNOSIS — I739 Peripheral vascular disease, unspecified: Secondary | ICD-10-CM | POA: Diagnosis not present

## 2017-11-19 DIAGNOSIS — I8310 Varicose veins of unspecified lower extremity with inflammation: Secondary | ICD-10-CM | POA: Diagnosis not present

## 2017-11-19 DIAGNOSIS — Z6837 Body mass index (BMI) 37.0-37.9, adult: Secondary | ICD-10-CM | POA: Diagnosis not present

## 2017-11-21 ENCOUNTER — Ambulatory Visit (HOSPITAL_COMMUNITY)
Admission: RE | Admit: 2017-11-21 | Discharge: 2017-11-21 | Disposition: A | Discharge status: Home / Self Care | Payer: Medicare Other | Source: Ambulatory Visit | Attending: Vascular Surgery | Admitting: Vascular Surgery

## 2017-11-21 ENCOUNTER — Other Ambulatory Visit (HOSPITAL_COMMUNITY): Payer: Self-pay | Admitting: Internal Medicine

## 2017-11-21 DIAGNOSIS — R600 Localized edema: Secondary | ICD-10-CM | POA: Insufficient documentation

## 2017-11-21 DIAGNOSIS — L989 Disorder of the skin and subcutaneous tissue, unspecified: Secondary | ICD-10-CM | POA: Diagnosis not present

## 2017-11-24 ENCOUNTER — Other Ambulatory Visit: Payer: Self-pay | Admitting: Neurology

## 2017-11-24 DIAGNOSIS — M9901 Segmental and somatic dysfunction of cervical region: Secondary | ICD-10-CM | POA: Diagnosis not present

## 2017-11-24 DIAGNOSIS — M9903 Segmental and somatic dysfunction of lumbar region: Secondary | ICD-10-CM | POA: Diagnosis not present

## 2017-11-24 DIAGNOSIS — M5136 Other intervertebral disc degeneration, lumbar region: Secondary | ICD-10-CM | POA: Diagnosis not present

## 2017-11-24 DIAGNOSIS — M5030 Other cervical disc degeneration, unspecified cervical region: Secondary | ICD-10-CM | POA: Diagnosis not present

## 2017-12-01 DIAGNOSIS — M5136 Other intervertebral disc degeneration, lumbar region: Secondary | ICD-10-CM | POA: Diagnosis not present

## 2017-12-01 DIAGNOSIS — M9901 Segmental and somatic dysfunction of cervical region: Secondary | ICD-10-CM | POA: Diagnosis not present

## 2017-12-01 DIAGNOSIS — M9903 Segmental and somatic dysfunction of lumbar region: Secondary | ICD-10-CM | POA: Diagnosis not present

## 2017-12-01 DIAGNOSIS — M5030 Other cervical disc degeneration, unspecified cervical region: Secondary | ICD-10-CM | POA: Diagnosis not present

## 2017-12-08 DIAGNOSIS — M5030 Other cervical disc degeneration, unspecified cervical region: Secondary | ICD-10-CM | POA: Diagnosis not present

## 2017-12-08 DIAGNOSIS — M5136 Other intervertebral disc degeneration, lumbar region: Secondary | ICD-10-CM | POA: Diagnosis not present

## 2017-12-08 DIAGNOSIS — M9903 Segmental and somatic dysfunction of lumbar region: Secondary | ICD-10-CM | POA: Diagnosis not present

## 2017-12-08 DIAGNOSIS — M9901 Segmental and somatic dysfunction of cervical region: Secondary | ICD-10-CM | POA: Diagnosis not present

## 2017-12-15 DIAGNOSIS — M5030 Other cervical disc degeneration, unspecified cervical region: Secondary | ICD-10-CM | POA: Diagnosis not present

## 2017-12-15 DIAGNOSIS — M9901 Segmental and somatic dysfunction of cervical region: Secondary | ICD-10-CM | POA: Diagnosis not present

## 2017-12-15 DIAGNOSIS — M5136 Other intervertebral disc degeneration, lumbar region: Secondary | ICD-10-CM | POA: Diagnosis not present

## 2017-12-15 DIAGNOSIS — M9903 Segmental and somatic dysfunction of lumbar region: Secondary | ICD-10-CM | POA: Diagnosis not present

## 2017-12-24 DIAGNOSIS — M9904 Segmental and somatic dysfunction of sacral region: Secondary | ICD-10-CM | POA: Diagnosis not present

## 2017-12-24 DIAGNOSIS — M9903 Segmental and somatic dysfunction of lumbar region: Secondary | ICD-10-CM | POA: Diagnosis not present

## 2017-12-24 DIAGNOSIS — M9901 Segmental and somatic dysfunction of cervical region: Secondary | ICD-10-CM | POA: Diagnosis not present

## 2017-12-24 DIAGNOSIS — M5136 Other intervertebral disc degeneration, lumbar region: Secondary | ICD-10-CM | POA: Diagnosis not present

## 2018-01-07 DIAGNOSIS — M9903 Segmental and somatic dysfunction of lumbar region: Secondary | ICD-10-CM | POA: Diagnosis not present

## 2018-01-07 DIAGNOSIS — M9904 Segmental and somatic dysfunction of sacral region: Secondary | ICD-10-CM | POA: Diagnosis not present

## 2018-01-07 DIAGNOSIS — M5136 Other intervertebral disc degeneration, lumbar region: Secondary | ICD-10-CM | POA: Diagnosis not present

## 2018-01-07 DIAGNOSIS — M9901 Segmental and somatic dysfunction of cervical region: Secondary | ICD-10-CM | POA: Diagnosis not present

## 2018-01-12 DIAGNOSIS — M9901 Segmental and somatic dysfunction of cervical region: Secondary | ICD-10-CM | POA: Diagnosis not present

## 2018-01-12 DIAGNOSIS — M5136 Other intervertebral disc degeneration, lumbar region: Secondary | ICD-10-CM | POA: Diagnosis not present

## 2018-01-12 DIAGNOSIS — M9903 Segmental and somatic dysfunction of lumbar region: Secondary | ICD-10-CM | POA: Diagnosis not present

## 2018-01-12 DIAGNOSIS — M9904 Segmental and somatic dysfunction of sacral region: Secondary | ICD-10-CM | POA: Diagnosis not present

## 2018-01-19 DIAGNOSIS — M5136 Other intervertebral disc degeneration, lumbar region: Secondary | ICD-10-CM | POA: Diagnosis not present

## 2018-01-19 DIAGNOSIS — M9904 Segmental and somatic dysfunction of sacral region: Secondary | ICD-10-CM | POA: Diagnosis not present

## 2018-01-19 DIAGNOSIS — M9903 Segmental and somatic dysfunction of lumbar region: Secondary | ICD-10-CM | POA: Diagnosis not present

## 2018-01-19 DIAGNOSIS — M9901 Segmental and somatic dysfunction of cervical region: Secondary | ICD-10-CM | POA: Diagnosis not present

## 2018-01-26 DIAGNOSIS — M9901 Segmental and somatic dysfunction of cervical region: Secondary | ICD-10-CM | POA: Diagnosis not present

## 2018-01-26 DIAGNOSIS — M5136 Other intervertebral disc degeneration, lumbar region: Secondary | ICD-10-CM | POA: Diagnosis not present

## 2018-01-26 DIAGNOSIS — M5030 Other cervical disc degeneration, unspecified cervical region: Secondary | ICD-10-CM | POA: Diagnosis not present

## 2018-01-26 DIAGNOSIS — M9903 Segmental and somatic dysfunction of lumbar region: Secondary | ICD-10-CM | POA: Diagnosis not present

## 2018-02-02 DIAGNOSIS — M9901 Segmental and somatic dysfunction of cervical region: Secondary | ICD-10-CM | POA: Diagnosis not present

## 2018-02-02 DIAGNOSIS — M5030 Other cervical disc degeneration, unspecified cervical region: Secondary | ICD-10-CM | POA: Diagnosis not present

## 2018-02-02 DIAGNOSIS — M5136 Other intervertebral disc degeneration, lumbar region: Secondary | ICD-10-CM | POA: Diagnosis not present

## 2018-02-02 DIAGNOSIS — M9903 Segmental and somatic dysfunction of lumbar region: Secondary | ICD-10-CM | POA: Diagnosis not present

## 2018-02-13 DIAGNOSIS — M9903 Segmental and somatic dysfunction of lumbar region: Secondary | ICD-10-CM | POA: Diagnosis not present

## 2018-02-13 DIAGNOSIS — M5136 Other intervertebral disc degeneration, lumbar region: Secondary | ICD-10-CM | POA: Diagnosis not present

## 2018-02-13 DIAGNOSIS — M9901 Segmental and somatic dysfunction of cervical region: Secondary | ICD-10-CM | POA: Diagnosis not present

## 2018-02-13 DIAGNOSIS — M5030 Other cervical disc degeneration, unspecified cervical region: Secondary | ICD-10-CM | POA: Diagnosis not present

## 2018-02-23 DIAGNOSIS — M9903 Segmental and somatic dysfunction of lumbar region: Secondary | ICD-10-CM | POA: Diagnosis not present

## 2018-02-23 DIAGNOSIS — M5030 Other cervical disc degeneration, unspecified cervical region: Secondary | ICD-10-CM | POA: Diagnosis not present

## 2018-02-23 DIAGNOSIS — M9901 Segmental and somatic dysfunction of cervical region: Secondary | ICD-10-CM | POA: Diagnosis not present

## 2018-02-23 DIAGNOSIS — M5136 Other intervertebral disc degeneration, lumbar region: Secondary | ICD-10-CM | POA: Diagnosis not present

## 2018-03-02 DIAGNOSIS — M5136 Other intervertebral disc degeneration, lumbar region: Secondary | ICD-10-CM | POA: Diagnosis not present

## 2018-03-02 DIAGNOSIS — M5030 Other cervical disc degeneration, unspecified cervical region: Secondary | ICD-10-CM | POA: Diagnosis not present

## 2018-03-02 DIAGNOSIS — M9901 Segmental and somatic dysfunction of cervical region: Secondary | ICD-10-CM | POA: Diagnosis not present

## 2018-03-02 DIAGNOSIS — M9903 Segmental and somatic dysfunction of lumbar region: Secondary | ICD-10-CM | POA: Diagnosis not present

## 2018-03-09 NOTE — Progress Notes (Signed)
bSubjective:   Rachel [name] was seen in consultation in the movement disorder clinic at the request of Marton Redwood, MD.  The evaluation is for tremor.  The patient is a 67 y.o. right handed female with a history of tremor.  The pt reports that she first noted shaking in the legs and arms bilaterally about 1 - 1.5 years ago.  It was intermittent.  She now notices tremor of the hands, legs and head.  She has better and worse days.  She notes that lying in the recliner helps.  She notes that walking and exercise makes it worse.  The L leg feels that it will stiffen up and cause the tremor.  There is a family hx of tremor in her mother but it was undiagnosed.    Affected by caffeine:  yes (quit drinking caffeine all together) Exacerbated by albuterol:  Yes Affected by alcohol:  unknown Affected by stress:  yes Affected by fatigue:  yes Spills soup if on spoon:  yes Spills glass of liquid if full:  yes Affects ADL's (tying shoes, brushing teeth, etc):  no Affects ability to put on makeup:  Yes  09/1813 update:  The patient is following up today regarding tremor.  She is accompanied by her spouse, who supplements the history.  I have not seen the patient since her first appointment in August, 2014.  At that point in time, she did not want any medication for the tremor.  She did have an MRI of the brain in August, 2014 that was normal.  I did review this.  Over the course of time, she has noted tremor in the L foot and hand.  She states that if she uses the hands, her L leg will stiffen and will eventually hurt.  Minimal neck pain.  Has back pain but thinks that is independent.  Her bladder/bowel work okay.  Tremor is much better in the hands, L more than R, but it is better now that she is taking herbs that her chiropractor gave her.  She is receiving kinesiology treatment.  She feels that her walking is "awkward" because of the foot.  She has trouble with arm swing.  She reports some shakiness with  handwriting (R hand dominant) but no micrographia.  She is sleeping well.  She has no vivid dreams.  No sleep walking/talking.  No diplopia.  Does have hx of scintillating scotomas that is sometimes associated with migraine.  Denies swallowing problems.  No speech changes per pt but husband states that sometimes pt inserts one word for another or has word finding trouble.  Has "no smell" but able to taste, but thinks that there is some loss because of not being able to taste food.  She admits that she has been researching various causes of her symptoms, including Parkinson's and dystonias.  She requests a DaT scan.  11/19/13 update:  The patient is following up today, accompanied by her husband who helps to supplement the history.  Since our last visit, the patient did undergo a DaT scan.  No films are available.  However, I did get the report.  There was asymmetric uptake, with no uptake in the right putamen and normal on the left.  Pt opted to hold on the MRIs ordered last visit.  We did start carbidopa/levodopa last visit and the patient states that it is about 30% beneficial for her symptoms.   03/21/14 update:  This patient is accompanied in the office by her spouse who supplements  the history.  Last visit, I increased the patients carbidopa/levodopa 25/100 to qid but she hasn't really need to do that.  She is overall doing much better.  I did send her for PT/OT and that has helped.   She has had some constipation and been on miralax for that.  She states that she had severe "brain fog" yesterday and thinks that it is related to the levodopa.  She was driving and was confused about where she was.  She has had some other minor episodes prior to that of feeling confused but nothing like that.  She states that her chiropracter gave her "brain medicine" (2 of them, one she calls fito-brain B12) and she states that medicine allowed her arm to swing. She sees her chiropractor daily now and is very pleased with her  services.  The levodopa did help so her leg no longer tremors.   She does think that she has had her thyroid checked this year and states that her synthroid was increased last visit but cannot remember when that was.    07/11/14 update:  The patient is following up today, accompanied by her husband who supplements the history.  The patient is currently on carbidopa/levodopa 25/100, 4 times per day (went back up from 3 times per day because of "jerking" and foot dystonia).  I reviewed records since her last visit.  She was in her wife for vacation in October when she called me.  She stated that she initially had a scratchy throat and then suddenly she was not able to taste.  This sounded viral to me, but I told her that nonetheless it was not related to [place], as symptoms do not come on that acutely.  I told her that she could follow up with a physician in [place].   She states that it took 6-8 weeks to go away, but her husband got the same thing.   She is not falling currently but thinks that her balance is really not good.  She cannot remember the last PT date.  She still has trouble with her memory.  That is her biggest c/o.  She is having word finding trouble.    12/02/14 update:  The patient is f/u today.  She is on carbidopa/levodopa 25/100 four times a day.  Her sister in law died a few weeks ago after complications from a fall.  This was unexpected.  She was only in her 54's.  She went for neuropsych testing since our last visit.  No evidence of dementia.  Evidence of adjustment disorder with mixed anxiety/depression and felt that she may benefit from brief trial of psychotherapy.  Pt states that she is doing well in that regard and does not think she needs the therapy right now and she did start some physical therapy since last visit, but she states that she did not end up completing it because something else came up, but feels that she has been doing well.  Her left foot only turns and when she is very  tired.  She has diplopia when looking left.  She has an appt with neuro-opth (Dr. Hassell Done) at the end of may.    04/13/15 update:  The patient is following up today regarding her MSA.  She is carbidopa/levodopa 25/100 25/100, 1 tablet 4 times per day.  Overall, the patient reports that she is doing well.  She has gotten a new job at the [employer] since our last visit.  She is working at  the corporate office as a receptionist.  She is having to multitask which is good for her.  Since her last visit, she did see Dr. Sanda Klein at [facility] in regards to her diplopia.  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  He said that the diplopia is not a component directly of MSA, but rather perhaps a loss of a compensatory ability secondary to [place].  He did not know why the symptoms were just with left gaze as the same dissociation occurred in both right and left gaze.  The patient was not interested in surgery, but he did recommend prisms and the patient was going to follow-up with Dr. Ellie Lunch at [facility] ophthalmology to discuss that further.  She has a temporary prism now and she can drive again now with it and she loves that.  She asks me about a permanent license plate (handicap) instead of the placard as she forgets to hang the placard sometimes.  She has had no falls but she has to be very careful.  She runs into walls a lot.  She is doing okay with swallowing (nothing worse than usual per pt).  She is walking and riding her bike for exercise.  If she doesn't her L leg will stiffen up.  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  She is on carbidopa/levodopa 25/100, 1 tablet 4 times per day.  She has had more tremor of the L leg and "it just drives me crazy."  She has had more constipation and miralax became less effective.  She has not had any falls since last visit.  No hallucinations.  No lightheadedness or near syncope.   Physical therapy notes indicate that the patient canceled multiple visits and ultimately was discharged because of that.  She states that at the time she felt that she didn't need it but now she has gotten weaker.  She has backed down on the supplements because she was getting sick; she is just taking Calium, D3 and still taking the "dopatone."  She is also very tired because she is not sleeping well.  10/12/15 update:  Pt f/u today re: MSA.  She is on carbidopa/levodopa 25/100 four times a day (9am/2:30pm/7pm/11pm) and I did recommend that she stop her dopatone supplement.  She is still on this but might take it every other day.  She is taking other supplements as well ("glutathione recycler").  She does state that her chiropractor gave her a different supplement and told her to d/c dopatone and told her to take [facility] glutathione and "it made a great difference."  She is taking it a bit after the meal times.  She was given the rancho recipe for constipation and I recommended that she start melatonin for insomnia.  She didn't try the rancho recipe because bran had gluten and gluten gives her headaches so she mixes prunes juice with miralax and uses a laxative tea.  She reports today that she has not had any falls.  She has not had any hallucinations or near syncope.  She was sent for PT at last visit but then refused therapy when contacted by them.  She is not exercising regularly but is occasionally riding her bike.  She works 4.5 daily.  02/08/16 update:  Pt f/u today re: MSA.  This patient is accompanied in the office by her spouse who supplements the history.  She was on carbidopa/levodopa 25/100 four times a day  last visit.  She called after our last visit and stated that she felt somewhat more stiff and I told her she could try an extra half tablet as needed.  She states that she has increased it to one tablet 5 times a day.  Feels that she has been weaker.  L foot been moving more and "drives me crazy."   She  has been using the indoor bike.  She denies any falls but she is having near falls.  Asks for RX for walker.    She denies any lightheadedness or near syncope.  No hallucinations.  Mood has been good per pt and husband.    Using melatonin for insomnia.  03/21/16 update:  Patient is seen today.  She remains on carbidopa/levodopa 25/100, 1 tablet 5 times per day.  Last visit, I started her on amantadine, 100 mg twice a day for dyskinesia.  She states that it helped the dyskinesia, but she thought it caused headache.  This would be an unusual side effect, but ended up calling her in a Medrol Dosepak.  She states that never even had to fill that RX because the headaches went away and she does think that it was allergy related.  She came today because her muscles are painful and hurting.  All month she has been struggling to even get off the couch to walk.  Her muscles are burning when they walk but not when she sits.  She has not had any falls since last visit. Last labs were 6-8 months ago.  I did give her a prescription for a Rollator a few weeks ago and she states that she has not gotten that yet.  She has not had any hallucinations.  No lightheadedness or near syncope.  Is having issues with constipation.  Tried rancho recipe.  On linzess.  4 colace per day without relief.  Even had n/v because of it.  Having trouble getting eyeliner on due to tremor.  06/13/16 update:  Patient seen in follow-up today.  She is on carbidopa/levodopa 25/100, one tablet 5 times per day.  She is on amantadine, 100 mg twice per day.  Pt denies falls but she has gotten close.  She has a rollator but doesn't like to use it at work.  Pt denies lightheadedness, near syncope.  No hallucinations.  Mood has been good.  She has had intermittent nausea for which Dr. Brigitte Pulse had been prescribing Zofran.  She called here and wanted me to prescribe that medication, but I told her I would prefer that come from the original prescriber.  States that  she has a routine now and she takes medication slowly in the AM and that helped her stomach.  I gave her a bowel purge recipe last visit and she states that it worked but she also states that she is using linzess and that has really helped.  I gave her samples of lyrica last visit for paresthesias and diffuse muscle pain but she read the package insert and she was too scared to try it.    12/04/16 update:  Patient seen in follow-up today.  This patient is accompanied in the office by her spouse who supplements the history.  She is on carbidopa/levodopa 25/100, one tablet 5 times per day and occasionally will take 6 times per day.  She is on amantadine, 100 mg twice per day.   She fell last month while walking the dog.  She was going off the sidewalk and the walker tipped  as the dog pulled her a little and she had a hairline fx in the hip.  She is seeing Dr. Maureen Ralphs and told no surgery but has to stay on the walker full time for 2 weeks and then can go back to the cane.  Having some low BP's that make her feel near syncopal  10/08/17 update: Patient is seen today in follow-up.  She remains on carbidopa/levodopa 25/100, 1 tablet 6 times per day (sometimes the 6th dose is in the middle of the night) and amantadine, 100 mg twice per day.  "If I take my medication on time every 2 hours, it works well."  Reviewed labs after calling PCP for them.  No more recent labs than had already had 05/22/17. Does state that she quit working due to trouble typing and word finding trouble.  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  She is only exercising intermittently.  Some occasional tightness and pain in the L leg.  Some L knee pain and has chronic back pain.  Occasionally will feel lightheaded and on those days, will use the walker.  Usually stable with the cane.  Not drinking much water.  03/10/18 update: Patient seen today in follow-up for atypical parkinsonism.  She is on carbidopa/levodopa 25/100, 1  tablet 5-6 times per day and amantadine 100 mg twice per day.  She has had no falls since our last visit.  Given information last visit on the atypical support group and she has been going and she is pleased with that.   She had no falls.  Some lightheadedness but no syncope.  That part has been better recently.  Drinking lots of fluids.  She is not doing exercise.  She has a stationary bike but not using.  She notes chronic nausea that levodopa will make worse if doesn't take with carb.  She is considering giving up driving as she is missing turns all of the time (not getting lost or noting reduced reflex time).     Current/Previously tried tremor medications: tried propranolol on one occasion but seemed to make her sick and she did not want to take the next pill  Current medications that may exacerbate tremor:  proair  Outside reports reviewed: historical medical records.  Allergies  Allergen Reactions  . Alendronate Other (See Comments)    headache  . Symbicort [Budesonide-Formoterol Fumarate] Other (See Comments)    Shakes and muscle weakness    Current Outpatient Medications on File Prior to Visit  Medication Sig Dispense Refill  . amantadine (SYMMETREL) 100 MG capsule Take 1 capsule (100 mg total) by mouth 2 (two) times daily. 180 capsule 1  . CALCIUM LACTATE PO Take 6 tablets by mouth daily.    . carbidopa-levodopa (SINEMET IR) 25-100 MG tablet TAKE 1 TABLET BY MOUTH SIX TIMES DAILY (Patient taking differently: TAKE 1 TABLET BY MOUTH FIVE-SIX TIMES DAILY) 540 tablet 1  . Cholecalciferol (VITAMIN D-3 PO) Take 3 tablets by mouth daily.    . citalopram (CELEXA) 20 MG tablet Take 20 mg by mouth daily.     Marland Kitchen levothyroxine (SYNTHROID, LEVOTHROID) 88 MCG tablet Take 88 mcg by mouth at bedtime.    Marland Kitchen linaclotide (LINZESS) 72 MCG capsule Take 72 mcg by mouth 4 (four) times daily.    . Multiple Vitamin (MULTIVITAMIN WITH MINERALS) TABS tablet Take 2 tablets by mouth daily.    Marland Kitchen omeprazole  (PRILOSEC OTC) 20 MG tablet Take 20 mg by mouth daily before breakfast.    .  ondansetron (ZOFRAN) 4 MG tablet Take 4 mg by mouth as needed for nausea or vomiting.    Marland Kitchen OVER THE COUNTER MEDICATION Phyto Brain    . VOLTAREN 1 % GEL      No current facility-administered medications on file prior to visit.     Past Medical History:  Diagnosis Date  . Anemia   . Arthritis   . Asthma   . Blood clot in vein  age 31   left leg  . GERD (gastroesophageal reflux disease)   . Hyperlipidemia   . Hypertension    pt denies, in physician record  . Hypothyroidism   . Multiple pregnancy loss, not currently pregnant    multiple miscarriages-pt.unsure of #  . Multiple system atrophy ([place]) 2015  . Nephrolithiasis    hx of kidney stones, last 6 months ago  . Osteopenia   . Tendinitis    left foot    Past Surgical History:  Procedure Laterality Date  . BREAST EXCISIONAL BIOPSY Right   . BREAST SURGERY     rt. lumpectomy, benign  . colonscopy  2011  . cystoscopy  2012  . JOINT REPLACEMENT  05-2007   right knee replacment/revision  . KIDNEY STONE SURGERY    . REPLACEMENT TOTAL KNEE Left 06/05/12  . TOTAL KNEE REVISION  06/03/2012   Procedure: TOTAL KNEE REVISION;  Surgeon: Gearlean Alf, MD;  Location: [facility];  Service: Orthopedics;  Laterality: Left;  Revision of a Left Uni Knee to a Total Knee Arthroplasty    Social History   Socioeconomic History  . Marital status: Married    Spouse name: Not on file  . Number of children: Not on file  . Years of education: Not on file  . Highest education level: Not on file  Occupational History  . 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 Financial resource strain: Not on file  . Food insecurity:    Worry: Not on file    Inability: Not on file  . Transportation needs:    Medical: Not on file    Non-medical: Not on file  Tobacco Use  . Smoking status: Never Smoker  . Smokeless tobacco:  Never Used  Substance and Sexual Activity  . Alcohol use: No    Alcohol/week: 0.0 standard drinks  . Drug use: No  . Sexual activity: Never    Partners: Male    Birth control/protection: Post-menopausal  Lifestyle  . Physical activity:    Days per week: Not on file    Minutes per session: Not on file  . Stress: Not on file  Relationships  . Social connections:    Talks on phone: Not on file    Gets together: Not on file    Attends religious service: Not on file    Active member of club or organization: Not on file    Attends meetings of clubs or organizations: Not on file    Relationship status: Not on file  . Intimate partner violence:    Fear of current or ex partner: Not on file    Emotionally abused: Not on file    Physically abused: Not on file    Forced sexual activity: Not on file  Other Topics Concern  . Not on file  Social History Narrative  . Not on file    Family Status  Relation Name Status  . Mother  Deceased  fall, death during surgery  . Father  Deceased       ? heart  . Brother  Alive       healthy  . Sister  Alive       2, asthma, heart problem  . Child  Alive       adopted, healthy  . Sister  (Not Specified)    Review of Systems Has lost weight but is trying.  A complete 10 system ROS was obtained and was negative apart from what is mentioned.   Objective:   VITALS:   Vitals:   03/10/18 1514  BP: 130/70  Pulse: 86  SpO2: 96%  Weight: 217 lb (98.4 kg)  Height: 5\' 6"  (1.676 m)   Wt Readings from Last 3 Encounters:  03/10/18 217 lb (98.4 kg)  10/08/17 218 lb (98.9 kg)  12/04/16 198 lb (89.8 kg)   No data found.   Gen:  Appears stated age and in NAD. HEENT:  Normocephalic, atraumatic. The mucous membranes are moist. The superficial temporal arteries are without ropiness or tenderness. Cardiovascular: Regular rate and rhythm. Lungs: Clear to auscultation bilaterally. Neck: There are no carotid bruits noted  bilaterally.  NEUROLOGICAL:  Orientation:  The patient is alert and oriented x 3.   Cranial nerves: There is good facial symmetry.  Extraocular muscles are intact and visual fields are full to confrontational testing. Speech is fluent and clear. Soft palate rises symmetrically and there is no tongue deviation. Hearing is intact to conversational tone. Tone: Tone is mild increased tone in left upper extremity. Coordination:  The patient has good rapid alternating movements today Motor: Strength is 5/5 in the bilateral upper and lower extremities.  Shoulder shrug is equal bilaterally.  There is no pronator drift.  There are no fasciculations noted. Gait and Station: The patient pushes off the chair to arise.  She walks well with her walker (which is really a transport chair/walker in one)  MOVEMENT EXAM: Tremor:   There is mild tremor in the RUE.  Possible rare dyskinesia in the L foot (she moved it right when I saw it)  Labs:  Received labs dated 05/22/17.  Sodium was 142, potassium 4.0, chloride 102, CO2 31, BUN 20 and creatinine 1.0.  White blood cells were 4.3, hemoglobin 13.8, hematocrit 41.3 and platelets 266.  TSH just slightly elevated at 4.54.   Assessment/Plan:   1.  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  She and I talked about the diagnosis.  She will continue on carbidopa/levodopa 25/100 5-6 times a day.  She looks good on this medication.    -Amantadine 100 mg bid has helped dyskinesia  -encouraged her to start exercise  -didn't want ST today but will let me know if changes mind.  Could benefit from LSVT.  She is concerned about cost.   2.  Cognitive impairment  -As above, she had neuropsych testing done in January, 2016 and there was no evidence of dementia.  There was some evidence of mixed anxiety/depression and a brief trial of psychotherapy was recommended, but the patient felt that she really did not need that.  Feels that she still is having issues but  doesn't wish to pursue counseling.    3.  Diplopia  -Pt saw Dr. Sanda Klein at [facility] in regards to her diplopia.  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  He said that the diplopia is not a component directly of MSA,  but rather perhaps a loss of a compensatory ability secondary to [place].  She has a temporary prism now and it has really helped.  4.  Insomnia  -using melatonin, 3 mg and states that it is working  5.  Orthostatic hypotension  -better with improved hydration  6.  Follow up is anticipated in the next 6 months, sooner should new neurologic issues arise.

## 2018-03-10 ENCOUNTER — Encounter: Payer: Self-pay | Admitting: Neurology

## 2018-03-10 ENCOUNTER — Ambulatory Visit: Payer: Medicare Other | Admitting: Neurology

## 2018-03-10 VITALS — BP 130/70 | HR 86 | Ht 66.0 in | Wt 217.0 lb

## 2018-03-10 DIAGNOSIS — G232 Striatonigral degeneration: Secondary | ICD-10-CM

## 2018-03-16 DIAGNOSIS — M5030 Other cervical disc degeneration, unspecified cervical region: Secondary | ICD-10-CM | POA: Diagnosis not present

## 2018-03-16 DIAGNOSIS — M9903 Segmental and somatic dysfunction of lumbar region: Secondary | ICD-10-CM | POA: Diagnosis not present

## 2018-03-16 DIAGNOSIS — M9901 Segmental and somatic dysfunction of cervical region: Secondary | ICD-10-CM | POA: Diagnosis not present

## 2018-03-16 DIAGNOSIS — M5136 Other intervertebral disc degeneration, lumbar region: Secondary | ICD-10-CM | POA: Diagnosis not present

## 2018-04-01 DIAGNOSIS — M5030 Other cervical disc degeneration, unspecified cervical region: Secondary | ICD-10-CM | POA: Diagnosis not present

## 2018-04-01 DIAGNOSIS — M5136 Other intervertebral disc degeneration, lumbar region: Secondary | ICD-10-CM | POA: Diagnosis not present

## 2018-04-01 DIAGNOSIS — M9901 Segmental and somatic dysfunction of cervical region: Secondary | ICD-10-CM | POA: Diagnosis not present

## 2018-04-01 DIAGNOSIS — M9903 Segmental and somatic dysfunction of lumbar region: Secondary | ICD-10-CM | POA: Diagnosis not present

## 2018-04-13 DIAGNOSIS — M9901 Segmental and somatic dysfunction of cervical region: Secondary | ICD-10-CM | POA: Diagnosis not present

## 2018-04-13 DIAGNOSIS — M9903 Segmental and somatic dysfunction of lumbar region: Secondary | ICD-10-CM | POA: Diagnosis not present

## 2018-04-13 DIAGNOSIS — M5136 Other intervertebral disc degeneration, lumbar region: Secondary | ICD-10-CM | POA: Diagnosis not present

## 2018-04-13 DIAGNOSIS — M9904 Segmental and somatic dysfunction of sacral region: Secondary | ICD-10-CM | POA: Diagnosis not present

## 2018-04-22 DIAGNOSIS — M9904 Segmental and somatic dysfunction of sacral region: Secondary | ICD-10-CM | POA: Diagnosis not present

## 2018-04-22 DIAGNOSIS — M9903 Segmental and somatic dysfunction of lumbar region: Secondary | ICD-10-CM | POA: Diagnosis not present

## 2018-04-22 DIAGNOSIS — M5136 Other intervertebral disc degeneration, lumbar region: Secondary | ICD-10-CM | POA: Diagnosis not present

## 2018-04-22 DIAGNOSIS — M9901 Segmental and somatic dysfunction of cervical region: Secondary | ICD-10-CM | POA: Diagnosis not present

## 2018-04-24 DIAGNOSIS — M9904 Segmental and somatic dysfunction of sacral region: Secondary | ICD-10-CM | POA: Diagnosis not present

## 2018-04-24 DIAGNOSIS — M9901 Segmental and somatic dysfunction of cervical region: Secondary | ICD-10-CM | POA: Diagnosis not present

## 2018-04-24 DIAGNOSIS — M5136 Other intervertebral disc degeneration, lumbar region: Secondary | ICD-10-CM | POA: Diagnosis not present

## 2018-04-24 DIAGNOSIS — M9903 Segmental and somatic dysfunction of lumbar region: Secondary | ICD-10-CM | POA: Diagnosis not present

## 2018-04-29 DIAGNOSIS — M9904 Segmental and somatic dysfunction of sacral region: Secondary | ICD-10-CM | POA: Diagnosis not present

## 2018-04-29 DIAGNOSIS — M5136 Other intervertebral disc degeneration, lumbar region: Secondary | ICD-10-CM | POA: Diagnosis not present

## 2018-04-29 DIAGNOSIS — M9901 Segmental and somatic dysfunction of cervical region: Secondary | ICD-10-CM | POA: Diagnosis not present

## 2018-04-29 DIAGNOSIS — M9903 Segmental and somatic dysfunction of lumbar region: Secondary | ICD-10-CM | POA: Diagnosis not present

## 2018-05-06 DIAGNOSIS — M9904 Segmental and somatic dysfunction of sacral region: Secondary | ICD-10-CM | POA: Diagnosis not present

## 2018-05-06 DIAGNOSIS — M9903 Segmental and somatic dysfunction of lumbar region: Secondary | ICD-10-CM | POA: Diagnosis not present

## 2018-05-06 DIAGNOSIS — M5136 Other intervertebral disc degeneration, lumbar region: Secondary | ICD-10-CM | POA: Diagnosis not present

## 2018-05-06 DIAGNOSIS — M9901 Segmental and somatic dysfunction of cervical region: Secondary | ICD-10-CM | POA: Diagnosis not present

## 2018-05-11 DIAGNOSIS — M5136 Other intervertebral disc degeneration, lumbar region: Secondary | ICD-10-CM | POA: Diagnosis not present

## 2018-05-11 DIAGNOSIS — M9901 Segmental and somatic dysfunction of cervical region: Secondary | ICD-10-CM | POA: Diagnosis not present

## 2018-05-11 DIAGNOSIS — M9903 Segmental and somatic dysfunction of lumbar region: Secondary | ICD-10-CM | POA: Diagnosis not present

## 2018-05-11 DIAGNOSIS — M9904 Segmental and somatic dysfunction of sacral region: Secondary | ICD-10-CM | POA: Diagnosis not present

## 2018-05-18 ENCOUNTER — Encounter: Payer: Self-pay | Admitting: Internal Medicine

## 2018-05-18 DIAGNOSIS — M9904 Segmental and somatic dysfunction of sacral region: Secondary | ICD-10-CM | POA: Diagnosis not present

## 2018-05-18 DIAGNOSIS — M9903 Segmental and somatic dysfunction of lumbar region: Secondary | ICD-10-CM | POA: Diagnosis not present

## 2018-05-18 DIAGNOSIS — M9901 Segmental and somatic dysfunction of cervical region: Secondary | ICD-10-CM | POA: Diagnosis not present

## 2018-05-18 DIAGNOSIS — M5136 Other intervertebral disc degeneration, lumbar region: Secondary | ICD-10-CM | POA: Diagnosis not present

## 2018-05-25 DIAGNOSIS — M9901 Segmental and somatic dysfunction of cervical region: Secondary | ICD-10-CM | POA: Diagnosis not present

## 2018-05-25 DIAGNOSIS — M5136 Other intervertebral disc degeneration, lumbar region: Secondary | ICD-10-CM | POA: Diagnosis not present

## 2018-05-25 DIAGNOSIS — M9903 Segmental and somatic dysfunction of lumbar region: Secondary | ICD-10-CM | POA: Diagnosis not present

## 2018-05-25 DIAGNOSIS — M9904 Segmental and somatic dysfunction of sacral region: Secondary | ICD-10-CM | POA: Diagnosis not present

## 2018-05-27 ENCOUNTER — Telehealth: Payer: Self-pay | Admitting: Internal Medicine

## 2018-05-27 NOTE — Telephone Encounter (Signed)
Pt states she has multiple system atrophy and she does not think she can tolerate the prep, afraid she will throw up. Discussed with pt that she should come in and discuss her options other than colonoscopy. Pt scheduled to see Dr. Henrene Pastor 06/23/18@2 :45pm. Pt aware of appt.

## 2018-05-27 NOTE — Telephone Encounter (Signed)
Patient received recall letter for colon but has some questions regarding colonoscopy

## 2018-06-01 ENCOUNTER — Encounter: Payer: Self-pay | Admitting: Neurology

## 2018-06-01 DIAGNOSIS — I10 Essential (primary) hypertension: Secondary | ICD-10-CM | POA: Diagnosis not present

## 2018-06-01 DIAGNOSIS — E7849 Other hyperlipidemia: Secondary | ICD-10-CM | POA: Diagnosis not present

## 2018-06-01 DIAGNOSIS — M9901 Segmental and somatic dysfunction of cervical region: Secondary | ICD-10-CM | POA: Diagnosis not present

## 2018-06-01 DIAGNOSIS — M5136 Other intervertebral disc degeneration, lumbar region: Secondary | ICD-10-CM | POA: Diagnosis not present

## 2018-06-01 DIAGNOSIS — M9904 Segmental and somatic dysfunction of sacral region: Secondary | ICD-10-CM | POA: Diagnosis not present

## 2018-06-01 DIAGNOSIS — E038 Other specified hypothyroidism: Secondary | ICD-10-CM | POA: Diagnosis not present

## 2018-06-01 DIAGNOSIS — E559 Vitamin D deficiency, unspecified: Secondary | ICD-10-CM | POA: Diagnosis not present

## 2018-06-01 DIAGNOSIS — M9903 Segmental and somatic dysfunction of lumbar region: Secondary | ICD-10-CM | POA: Diagnosis not present

## 2018-06-01 DIAGNOSIS — R82998 Other abnormal findings in urine: Secondary | ICD-10-CM | POA: Diagnosis not present

## 2018-06-08 ENCOUNTER — Other Ambulatory Visit: Payer: Self-pay | Admitting: Internal Medicine

## 2018-06-08 DIAGNOSIS — M810 Age-related osteoporosis without current pathological fracture: Secondary | ICD-10-CM

## 2018-06-08 DIAGNOSIS — I10 Essential (primary) hypertension: Secondary | ICD-10-CM | POA: Diagnosis not present

## 2018-06-08 DIAGNOSIS — N6310 Unspecified lump in the right breast, unspecified quadrant: Secondary | ICD-10-CM

## 2018-06-08 DIAGNOSIS — M5136 Other intervertebral disc degeneration, lumbar region: Secondary | ICD-10-CM | POA: Diagnosis not present

## 2018-06-08 DIAGNOSIS — M9903 Segmental and somatic dysfunction of lumbar region: Secondary | ICD-10-CM | POA: Diagnosis not present

## 2018-06-08 DIAGNOSIS — Z0000 Encounter for general adult medical examination without abnormal findings: Secondary | ICD-10-CM | POA: Diagnosis not present

## 2018-06-08 DIAGNOSIS — M9901 Segmental and somatic dysfunction of cervical region: Secondary | ICD-10-CM | POA: Diagnosis not present

## 2018-06-08 DIAGNOSIS — E7849 Other hyperlipidemia: Secondary | ICD-10-CM | POA: Diagnosis not present

## 2018-06-08 DIAGNOSIS — M9904 Segmental and somatic dysfunction of sacral region: Secondary | ICD-10-CM | POA: Diagnosis not present

## 2018-06-08 DIAGNOSIS — Z7189 Other specified counseling: Secondary | ICD-10-CM | POA: Diagnosis not present

## 2018-06-10 ENCOUNTER — Telehealth: Payer: Self-pay | Admitting: Neurology

## 2018-06-10 MED ORDER — AMANTADINE HCL 100 MG PO CAPS: 100.0000 mg | ORAL_CAPSULE | Freq: Two times a day (BID) | ORAL | 1 refills | Status: DC

## 2018-06-10 NOTE — Telephone Encounter (Signed)
Patient is calling to check on the status of the amantadine medication refill. She said the pharmacy should have sent something. This refill should be sent to the [facility] on [address]. Thanks!

## 2018-06-10 NOTE — Telephone Encounter (Signed)
We did not receive a request for refill. Medication now refilled to [facility] on [address] LMOM making patient aware.

## 2018-06-12 DIAGNOSIS — Z1212 Encounter for screening for malignant neoplasm of rectum: Secondary | ICD-10-CM | POA: Diagnosis not present

## 2018-06-15 DIAGNOSIS — M9903 Segmental and somatic dysfunction of lumbar region: Secondary | ICD-10-CM | POA: Diagnosis not present

## 2018-06-15 DIAGNOSIS — M5136 Other intervertebral disc degeneration, lumbar region: Secondary | ICD-10-CM | POA: Diagnosis not present

## 2018-06-15 DIAGNOSIS — M9904 Segmental and somatic dysfunction of sacral region: Secondary | ICD-10-CM | POA: Diagnosis not present

## 2018-06-15 DIAGNOSIS — M9901 Segmental and somatic dysfunction of cervical region: Secondary | ICD-10-CM | POA: Diagnosis not present

## 2018-06-16 ENCOUNTER — Ambulatory Visit
Admission: RE | Admit: 2018-06-16 | Discharge: 2018-06-16 | Disposition: A | Discharge status: Home / Self Care | Payer: Medicare Other | Source: Ambulatory Visit | Attending: Internal Medicine | Admitting: Internal Medicine

## 2018-06-16 DIAGNOSIS — R928 Other abnormal and inconclusive findings on diagnostic imaging of breast: Secondary | ICD-10-CM | POA: Diagnosis not present

## 2018-06-16 DIAGNOSIS — N6310 Unspecified lump in the right breast, unspecified quadrant: Secondary | ICD-10-CM

## 2018-06-18 ENCOUNTER — Other Ambulatory Visit: Payer: Medicare Other

## 2018-06-19 ENCOUNTER — Encounter: Payer: Self-pay | Admitting: Internal Medicine

## 2018-06-22 DIAGNOSIS — M9901 Segmental and somatic dysfunction of cervical region: Secondary | ICD-10-CM | POA: Diagnosis not present

## 2018-06-22 DIAGNOSIS — M9904 Segmental and somatic dysfunction of sacral region: Secondary | ICD-10-CM | POA: Diagnosis not present

## 2018-06-22 DIAGNOSIS — M5136 Other intervertebral disc degeneration, lumbar region: Secondary | ICD-10-CM | POA: Diagnosis not present

## 2018-06-22 DIAGNOSIS — M9903 Segmental and somatic dysfunction of lumbar region: Secondary | ICD-10-CM | POA: Diagnosis not present

## 2018-06-23 ENCOUNTER — Ambulatory Visit: Payer: Medicare Other | Admitting: Internal Medicine

## 2018-06-24 ENCOUNTER — Ambulatory Visit
Admission: RE | Admit: 2018-06-24 | Discharge: 2018-06-24 | Disposition: A | Discharge status: Home / Self Care | Payer: Medicare Other | Source: Ambulatory Visit | Attending: Internal Medicine | Admitting: Internal Medicine

## 2018-06-24 DIAGNOSIS — N6489 Other specified disorders of breast: Secondary | ICD-10-CM | POA: Diagnosis not present

## 2018-06-30 ENCOUNTER — Other Ambulatory Visit: Payer: Medicare Other

## 2018-07-01 ENCOUNTER — Other Ambulatory Visit: Payer: Medicare Other

## 2018-07-06 DIAGNOSIS — M9901 Segmental and somatic dysfunction of cervical region: Secondary | ICD-10-CM | POA: Diagnosis not present

## 2018-07-06 DIAGNOSIS — M9904 Segmental and somatic dysfunction of sacral region: Secondary | ICD-10-CM | POA: Diagnosis not present

## 2018-07-06 DIAGNOSIS — M5136 Other intervertebral disc degeneration, lumbar region: Secondary | ICD-10-CM | POA: Diagnosis not present

## 2018-07-06 DIAGNOSIS — M9903 Segmental and somatic dysfunction of lumbar region: Secondary | ICD-10-CM | POA: Diagnosis not present

## 2018-07-13 DIAGNOSIS — M9901 Segmental and somatic dysfunction of cervical region: Secondary | ICD-10-CM | POA: Diagnosis not present

## 2018-07-13 DIAGNOSIS — M9903 Segmental and somatic dysfunction of lumbar region: Secondary | ICD-10-CM | POA: Diagnosis not present

## 2018-07-13 DIAGNOSIS — M9904 Segmental and somatic dysfunction of sacral region: Secondary | ICD-10-CM | POA: Diagnosis not present

## 2018-07-13 DIAGNOSIS — M5136 Other intervertebral disc degeneration, lumbar region: Secondary | ICD-10-CM | POA: Diagnosis not present

## 2018-07-21 ENCOUNTER — Telehealth: Payer: Self-pay | Admitting: Neurology

## 2018-07-21 NOTE — Telephone Encounter (Signed)
SCAT forms completed and faxed to [phone] with confirmation received.

## 2018-07-27 DIAGNOSIS — M5136 Other intervertebral disc degeneration, lumbar region: Secondary | ICD-10-CM | POA: Diagnosis not present

## 2018-07-27 DIAGNOSIS — M9901 Segmental and somatic dysfunction of cervical region: Secondary | ICD-10-CM | POA: Diagnosis not present

## 2018-07-27 DIAGNOSIS — M9903 Segmental and somatic dysfunction of lumbar region: Secondary | ICD-10-CM | POA: Diagnosis not present

## 2018-07-27 DIAGNOSIS — M5030 Other cervical disc degeneration, unspecified cervical region: Secondary | ICD-10-CM | POA: Diagnosis not present

## 2018-08-03 DIAGNOSIS — M5030 Other cervical disc degeneration, unspecified cervical region: Secondary | ICD-10-CM | POA: Diagnosis not present

## 2018-08-03 DIAGNOSIS — M9901 Segmental and somatic dysfunction of cervical region: Secondary | ICD-10-CM | POA: Diagnosis not present

## 2018-08-03 DIAGNOSIS — M9903 Segmental and somatic dysfunction of lumbar region: Secondary | ICD-10-CM | POA: Diagnosis not present

## 2018-08-03 DIAGNOSIS — M5136 Other intervertebral disc degeneration, lumbar region: Secondary | ICD-10-CM | POA: Diagnosis not present

## 2018-08-04 ENCOUNTER — Encounter: Payer: Self-pay | Admitting: Internal Medicine

## 2018-08-04 ENCOUNTER — Ambulatory Visit: Payer: Medicare Other | Admitting: Internal Medicine

## 2018-08-04 ENCOUNTER — Telehealth: Payer: Self-pay | Admitting: *Deleted

## 2018-08-04 ENCOUNTER — Encounter (INDEPENDENT_AMBULATORY_CARE_PROVIDER_SITE_OTHER): Payer: Self-pay

## 2018-08-04 VITALS — BP 120/76 | HR 80 | Ht 64.0 in | Wt 213.2 lb

## 2018-08-04 DIAGNOSIS — R195 Other fecal abnormalities: Secondary | ICD-10-CM | POA: Diagnosis not present

## 2018-08-04 DIAGNOSIS — R110 Nausea: Secondary | ICD-10-CM | POA: Diagnosis not present

## 2018-08-04 MED ORDER — NA SULFATE-K SULFATE-MG SULF 17.5-3.13-1.6 GM/177ML PO SOLN: 1.0000 | Freq: Once | ORAL | 0 refills | Status: AC

## 2018-08-04 MED ORDER — ONDANSETRON HCL 4 MG PO TABS: 4.0000 mg | ORAL_TABLET | Freq: Three times a day (TID) | ORAL | 1 refills | Status: DC | PRN

## 2018-08-04 NOTE — Telephone Encounter (Signed)
Noted! Thank you

## 2018-08-04 NOTE — Progress Notes (Signed)
HISTORY OF PRESENT ILLNESS:  Rachel [name] is a 68 y.o. female with past medical history as listed below.  She presents today at the request of her primary care physician Dr. Carmie [name] regarding positive Cologuard testing.  Patient was last seen in this facility September 2009 when she underwent colonoscopy and upper endoscopy to evaluate Hemoccult-positive stool and dysphasia with reflux symptoms respectively.  Colonoscopy revealed moderate sigmoid diverticulosis.  No polyps.  Upper endoscopy revealed esophageal stricture and NSAID related duodenitis.  The esophagus was dilated with 54 [place] Mal[place] dilator.  The patient elected for Cologuard which returned positive.  Hemoccult testing in November was negative.  She understands that she needs colonoscopy but is concerned that she will not be able to tolerate the prep secondary to her diagnosis of multiple system atrophy with Parkinson symptoms.  Apparently she has trouble with large volumes of liquids at one time.  For her chronic GERD she takes omeprazole OTC.  She does have occasional dysphasia.  Her weight has been stable.  No bleeding.  She has constipation for which she takes Linzess.  She is accompanied by her husband.  CT scan of the abdomen and pelvis 2013 without significant abnormalities.  REVIEW OF SYSTEMS:  All non-GI ROS negative as otherwise stated in the HPI except for allergies, back pain, cough, headaches, itching, sleeping problems, ankle swelling, voice change  Past Medical History:  Diagnosis Date  . Allergic rhinitis   . Anemia   . Arthritis   . Asthma   . Blood clot in vein  age 44   left leg  . Depression   . GERD (gastroesophageal reflux disease)   . Hyperlipidemia   . Hypertension    pt denies, in physician record  . Hypothyroidism   . Kidney stones   . Multiple pregnancy loss, not currently pregnant    multiple miscarriages-pt.unsure of #  . Multiple system atrophy ([place]) 2015  . Nephrolithiasis    hx  of kidney stones, last 6 months ago  . Osteopenia   . Osteoporosis   . Parkinsonism ([place])   . Tendinitis    left foot  . Vitamin D deficiency     Past Surgical History:  Procedure Laterality Date  . BREAST EXCISIONAL BIOPSY Right   . BREAST SURGERY     rt. lumpectomy, benign  . colonscopy  2011  . cystoscopy  2012  . JOINT REPLACEMENT  05-2007   right knee replacment/revision  . KIDNEY STONE SURGERY    . REPLACEMENT TOTAL KNEE Left 06/05/12  . TOTAL KNEE REVISION  06/03/2012   Procedure: TOTAL KNEE REVISION;  Surgeon: Gearlean Alf, MD;  Location: [facility];  Service: Orthopedics;  Laterality: Left;  Revision of a Left Uni Knee to a Total Knee Arthroplasty    Social History Rachel [name]  reports that she has never smoked. She has never used smokeless tobacco. She reports that she does not drink alcohol or use drugs.  family history includes Asthma in her sister; Breast cancer in her sister; Diabetes in her mother; Heart attack in her brother; Heart failure in her father, mother, and sister; Thyroid disease in her mother.  Allergies  Allergen Reactions  . Fosamax [Alendronate] Other (See Comments)    headache  . Symbicort [Budesonide-Formoterol Fumarate] Other (See Comments)    Shakes and muscle weakness       PHYSICAL EXAMINATION: Vital signs: BP 120/76 (BP Location: Left Arm, Patient Position: Sitting, Cuff Size: Normal)   [name] 79  Ht 5\' 4"  (1.626 m) Comment: height measured without shoes  Wt 213 lb 4 oz (96.7 kg)   BMI 36.60 kg/m   Constitutional: generally well-appearing, no acute distress Psychiatric: alert and oriented x3, cooperative Eyes: extraocular movements intact, anicteric, conjunctiva pink Mouth: oral pharynx moist, no lesions Neck: supple no lymphadenopathy Cardiovascular: heart regular rate and rhythm, no murmur Lungs: clear to auscultation bilaterally Abdomen: soft, nontender, nondistended, no obvious ascites, no peritoneal signs, normal bowel  sounds, no organomegaly Rectal: Deferred until colonoscopy Extremities: no clubbing, cyanosis.  1+ lower extremity edema bilaterally Skin: no lesions on visible extremities Neuro: No focal deficits.  Tremor.  Cranial nerves intact  ASSESSMENT:  1.  Positive Cologuard testing 2.  Colonoscopy 2009 with diverticulosis 3.  GERD with a history of peptic stricture.  On as needed omeprazole 4.  Multiple medical problems   PLAN:  1.  Discussed ramifications of positive Cologuard testing.  Approximately 50% of the time meaningful pathology found and 50% of the time examination is negative for meaningful pathology 2.  Discussed slow prep for colonoscopy as well as providing antiemetics. 3.  Schedule colonoscopy.The nature of the procedure, as well as the risks, benefits, and alternatives were carefully and thoroughly reviewed with the patient. Ample time for discussion and questions allowed. The patient understood, was satisfied, and agreed to proceed. Note: Prescribed Zofran 8 mg 20 minutes before each prep session.  May use every 4 hours thereafter as needed.  Dispense 20 of the 4 mg tablets 4.  Reflux precautions 5.  On-demand PPI as needed 6.  Ongoing general medical care with Dr. Brigitte [name]

## 2018-08-04 NOTE — Patient Instructions (Addendum)
We have sent the following medications to your pharmacy for you to pick up at your convenience:  Zofran  You have been scheduled for a colonoscopy. Please follow written instructions given to you at your visit today.  Please pick up your prep supplies at the pharmacy within the next 1-3 days. If you use inhalers (even only as needed), please bring them with you on the day of your procedure. Your physician has requested that you go to www.startemmi.com and enter the access code given to you at your visit today. This web site gives a general overview about your procedure. However, you should still follow specific instructions given to you by our office regarding your preparation for the procedure.   Take TWO (2) Zofran about 20 minutes before drinking each prep.

## 2018-08-04 NOTE — Telephone Encounter (Signed)
Pt came up to [facility] after previsit with questions and concerns about not eating day before her procedure.  She takes medication for her parkinson's and needs to take it with food.  I suggested to her that she try to drink plenty of broth and eat jello or popsicles before taking her meds.  She will plan to try this but is concerned that it could make her sick and she would have to cancel last minute. I explained that I would give Dr. Henrene Pastor a heads up about her concerns.  I also gave her my name and told her to call if she has further questions or concerns.

## 2018-08-06 ENCOUNTER — Telehealth: Payer: Self-pay | Admitting: Neurology

## 2018-08-06 ENCOUNTER — Other Ambulatory Visit: Payer: Self-pay | Admitting: Neurology

## 2018-08-06 NOTE — Telephone Encounter (Signed)
She can hold the amantadine that day.

## 2018-08-06 NOTE — Telephone Encounter (Signed)
Patient called and lmom needing to check the status on her paperwork that was dropped off? Please Call. Thanks

## 2018-08-06 NOTE — Telephone Encounter (Signed)
Left message on machine for patient to call back.  To make her aware of Dr. Doristine Devoid recommendation.

## 2018-08-06 NOTE — Telephone Encounter (Signed)
Patient made aware [employer] forms sent back on 07/21/2018.  She also wanted Dr. Doristine Devoid opinion on her colonoscopy. She had a positive Cologuard test and GI is recommending a colonoscopy. She is nervous about the bowel prep because if she doesn't take her medication (mainly amantadine) with food she gets very sick to her stomach. She wanted your opinion. Dr. Carles Collet - please advise.

## 2018-08-10 DIAGNOSIS — M5030 Other cervical disc degeneration, unspecified cervical region: Secondary | ICD-10-CM | POA: Diagnosis not present

## 2018-08-10 DIAGNOSIS — M9901 Segmental and somatic dysfunction of cervical region: Secondary | ICD-10-CM | POA: Diagnosis not present

## 2018-08-10 DIAGNOSIS — M5136 Other intervertebral disc degeneration, lumbar region: Secondary | ICD-10-CM | POA: Diagnosis not present

## 2018-08-10 DIAGNOSIS — M9903 Segmental and somatic dysfunction of lumbar region: Secondary | ICD-10-CM | POA: Diagnosis not present

## 2018-08-17 DIAGNOSIS — M5136 Other intervertebral disc degeneration, lumbar region: Secondary | ICD-10-CM | POA: Diagnosis not present

## 2018-08-17 DIAGNOSIS — M9903 Segmental and somatic dysfunction of lumbar region: Secondary | ICD-10-CM | POA: Diagnosis not present

## 2018-08-17 DIAGNOSIS — M9901 Segmental and somatic dysfunction of cervical region: Secondary | ICD-10-CM | POA: Diagnosis not present

## 2018-08-17 DIAGNOSIS — M5030 Other cervical disc degeneration, unspecified cervical region: Secondary | ICD-10-CM | POA: Diagnosis not present

## 2018-08-21 ENCOUNTER — Other Ambulatory Visit: Payer: Medicare Other

## 2018-08-24 DIAGNOSIS — M5030 Other cervical disc degeneration, unspecified cervical region: Secondary | ICD-10-CM | POA: Diagnosis not present

## 2018-08-24 DIAGNOSIS — M9901 Segmental and somatic dysfunction of cervical region: Secondary | ICD-10-CM | POA: Diagnosis not present

## 2018-08-24 DIAGNOSIS — M5136 Other intervertebral disc degeneration, lumbar region: Secondary | ICD-10-CM | POA: Diagnosis not present

## 2018-08-24 DIAGNOSIS — M9903 Segmental and somatic dysfunction of lumbar region: Secondary | ICD-10-CM | POA: Diagnosis not present

## 2018-08-24 NOTE — Progress Notes (Signed)
bSubjective:   Rachel [name] was seen in consultation in the movement disorder clinic at the request of Marton Redwood, MD.  The evaluation is for tremor.  The patient is a 68 y.o. right handed female with a history of tremor.  The pt reports that she first noted shaking in the legs and arms bilaterally about 1 - 1.5 years ago.  It was intermittent.  She now notices tremor of the hands, legs and head.  She has better and worse days.  She notes that lying in the recliner helps.  She notes that walking and exercise makes it worse.  The L leg feels that it will stiffen up and cause the tremor.  There is a family hx of tremor in her mother but it was undiagnosed.    Affected by caffeine:  yes (quit drinking caffeine all together) Exacerbated by albuterol:  Yes Affected by alcohol:  unknown Affected by stress:  yes Affected by fatigue:  yes Spills soup if on spoon:  yes Spills glass of liquid if full:  yes Affects ADL's (tying shoes, brushing teeth, etc):  no Affects ability to put on makeup:  Yes  09/1813 update:  The patient is following up today regarding tremor.  She is accompanied by her spouse, who supplements the history.  I have not seen the patient since her first appointment in August, 2014.  At that point in time, she did not want any medication for the tremor.  She did have an MRI of the brain in August, 2014 that was normal.  I did review this.  Over the course of time, she has noted tremor in the L foot and hand.  She states that if she uses the hands, her L leg will stiffen and will eventually hurt.  Minimal neck pain.  Has back pain but thinks that is independent.  Her bladder/bowel work okay.  Tremor is much better in the hands, L more than R, but it is better now that she is taking herbs that her chiropractor gave her.  She is receiving kinesiology treatment.  She feels that her walking is "awkward" because of the foot.  She has trouble with arm swing.  She reports some shakiness with  handwriting (R hand dominant) but no micrographia.  She is sleeping well.  She has no vivid dreams.  No sleep walking/talking.  No diplopia.  Does have hx of scintillating scotomas that is sometimes associated with migraine.  Denies swallowing problems.  No speech changes per pt but husband states that sometimes pt inserts one word for another or has word finding trouble.  Has "no smell" but able to taste, but thinks that there is some loss because of not being able to taste food.  She admits that she has been researching various causes of her symptoms, including Parkinson's and dystonias.  She requests a DaT scan.  11/19/13 update:  The patient is following up today, accompanied by her husband who helps to supplement the history.  Since our last visit, the patient did undergo a DaT scan.  No films are available.  However, I did get the report.  There was asymmetric uptake, with no uptake in the right putamen and normal on the left.  Pt opted to hold on the MRIs ordered last visit.  We did start carbidopa/levodopa last visit and the patient states that it is about 30% beneficial for her symptoms.   03/21/14 update:  This patient is accompanied in the office by her spouse who supplements  the history.  Last visit, I increased the patients carbidopa/levodopa 25/100 to qid but she hasn't really need to do that.  She is overall doing much better.  I did send her for PT/OT and that has helped.   She has had some constipation and been on miralax for that.  She states that she had severe "brain fog" yesterday and thinks that it is related to the levodopa.  She was driving and was confused about where she was.  She has had some other minor episodes prior to that of feeling confused but nothing like that.  She states that her chiropracter gave her "brain medicine" (2 of them, one she calls fito-brain B12) and she states that medicine allowed her arm to swing. She sees her chiropractor daily now and is very pleased with her  services.  The levodopa did help so her leg no longer tremors.   She does think that she has had her thyroid checked this year and states that her synthroid was increased last visit but cannot remember when that was.    07/11/14 update:  The patient is following up today, accompanied by her husband who supplements the history.  The patient is currently on carbidopa/levodopa 25/100, 4 times per day (went back up from 3 times per day because of "jerking" and foot dystonia).  I reviewed records since her last visit.  She was in her wife for vacation in October when she called me.  She stated that she initially had a scratchy throat and then suddenly she was not able to taste.  This sounded viral to me, but I told her that nonetheless it was not related to [place], as symptoms do not come on that acutely.  I told her that she could follow up with a physician in [place].   She states that it took 6-8 weeks to go away, but her husband got the same thing.   She is not falling currently but thinks that her balance is really not good.  She cannot remember the last PT date.  She still has trouble with her memory.  That is her biggest c/o.  She is having word finding trouble.    12/02/14 update:  The patient is f/u today.  She is on carbidopa/levodopa 25/100 four times a day.  Her sister in law died a few weeks ago after complications from a fall.  This was unexpected.  She was only in her 54's.  She went for neuropsych testing since our last visit.  No evidence of dementia.  Evidence of adjustment disorder with mixed anxiety/depression and felt that she may benefit from brief trial of psychotherapy.  Pt states that she is doing well in that regard and does not think she needs the therapy right now and she did start some physical therapy since last visit, but she states that she did not end up completing it because something else came up, but feels that she has been doing well.  Her left foot only turns and when she is very  tired.  She has diplopia when looking left.  She has an appt with neuro-opth (Dr. Hassell Done) at the end of may.    04/13/15 update:  The patient is following up today regarding her MSA.  She is carbidopa/levodopa 25/100 25/100, 1 tablet 4 times per day.  Overall, the patient reports that she is doing well.  She has gotten a new job at the [employer] since our last visit.  She is working at  the corporate office as a receptionist.  She is having to multitask which is good for her.  Since her last visit, she did see Dr. Sanda Klein at [facility] in regards to her diplopia.  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  He said that the diplopia is not a component directly of MSA, but rather perhaps a loss of a compensatory ability secondary to [place].  He did not know why the symptoms were just with left gaze as the same dissociation occurred in both right and left gaze.  The patient was not interested in surgery, but he did recommend prisms and the patient was going to follow-up with Dr. Ellie Lunch at [facility] ophthalmology to discuss that further.  She has a temporary prism now and she can drive again now with it and she loves that.  She asks me about a permanent license plate (handicap) instead of the placard as she forgets to hang the placard sometimes.  She has had no falls but she has to be very careful.  She runs into walls a lot.  She is doing okay with swallowing (nothing worse than usual per pt).  She is walking and riding her bike for exercise.  If she doesn't her L leg will stiffen up.  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  She is on carbidopa/levodopa 25/100, 1 tablet 4 times per day.  She has had more tremor of the L leg and "it just drives me crazy."  She has had more constipation and miralax became less effective.  She has not had any falls since last visit.  No hallucinations.  No lightheadedness or near syncope.   Physical therapy notes indicate that the patient canceled multiple visits and ultimately was discharged because of that.  She states that at the time she felt that she didn't need it but now she has gotten weaker.  She has backed down on the supplements because she was getting sick; she is just taking Calium, D3 and still taking the "dopatone."  She is also very tired because she is not sleeping well.  10/12/15 update:  Pt f/u today re: MSA.  She is on carbidopa/levodopa 25/100 four times a day (9am/2:30pm/7pm/11pm) and I did recommend that she stop her dopatone supplement.  She is still on this but might take it every other day.  She is taking other supplements as well ("glutathione recycler").  She does state that her chiropractor gave her a different supplement and told her to d/c dopatone and told her to take [facility] glutathione and "it made a great difference."  She is taking it a bit after the meal times.  She was given the rancho recipe for constipation and I recommended that she start melatonin for insomnia.  She didn't try the rancho recipe because bran had gluten and gluten gives her headaches so she mixes prunes juice with miralax and uses a laxative tea.  She reports today that she has not had any falls.  She has not had any hallucinations or near syncope.  She was sent for PT at last visit but then refused therapy when contacted by them.  She is not exercising regularly but is occasionally riding her bike.  She works 4.5 daily.  02/08/16 update:  Pt f/u today re: MSA.  This patient is accompanied in the office by her spouse who supplements the history.  She was on carbidopa/levodopa 25/100 four times a day  last visit.  She called after our last visit and stated that she felt somewhat more stiff and I told her she could try an extra half tablet as needed.  She states that she has increased it to one tablet 5 times a day.  Feels that she has been weaker.  L foot been moving more and "drives me crazy."   She  has been using the indoor bike.  She denies any falls but she is having near falls.  Asks for RX for walker.    She denies any lightheadedness or near syncope.  No hallucinations.  Mood has been good per pt and husband.    Using melatonin for insomnia.  03/21/16 update:  Patient is seen today.  She remains on carbidopa/levodopa 25/100, 1 tablet 5 times per day.  Last visit, I started her on amantadine, 100 mg twice a day for dyskinesia.  She states that it helped the dyskinesia, but she thought it caused headache.  This would be an unusual side effect, but ended up calling her in a Medrol Dosepak.  She states that never even had to fill that RX because the headaches went away and she does think that it was allergy related.  She came today because her muscles are painful and hurting.  All month she has been struggling to even get off the couch to walk.  Her muscles are burning when they walk but not when she sits.  She has not had any falls since last visit. Last labs were 6-8 months ago.  I did give her a prescription for a Rollator a few weeks ago and she states that she has not gotten that yet.  She has not had any hallucinations.  No lightheadedness or near syncope.  Is having issues with constipation.  Tried rancho recipe.  On linzess.  4 colace per day without relief.  Even had n/v because of it.  Having trouble getting eyeliner on due to tremor.  06/13/16 update:  Patient seen in follow-up today.  She is on carbidopa/levodopa 25/100, one tablet 5 times per day.  She is on amantadine, 100 mg twice per day.  Pt denies falls but she has gotten close.  She has a rollator but doesn't like to use it at work.  Pt denies lightheadedness, near syncope.  No hallucinations.  Mood has been good.  She has had intermittent nausea for which Dr. Brigitte Pulse had been prescribing Zofran.  She called here and wanted me to prescribe that medication, but I told her I would prefer that come from the original prescriber.  States that  she has a routine now and she takes medication slowly in the AM and that helped her stomach.  I gave her a bowel purge recipe last visit and she states that it worked but she also states that she is using linzess and that has really helped.  I gave her samples of lyrica last visit for paresthesias and diffuse muscle pain but she read the package insert and she was too scared to try it.    12/04/16 update:  Patient seen in follow-up today.  This patient is accompanied in the office by her spouse who supplements the history.  She is on carbidopa/levodopa 25/100, one tablet 5 times per day and occasionally will take 6 times per day.  She is on amantadine, 100 mg twice per day.   She fell last month while walking the dog.  She was going off the sidewalk and the walker tipped  as the dog pulled her a little and she had a hairline fx in the hip.  She is seeing Dr. Maureen Ralphs and told no surgery but has to stay on the walker full time for 2 weeks and then can go back to the cane.  Having some low BP's that make her feel near syncopal  10/08/17 update: Patient is seen today in follow-up.  She remains on carbidopa/levodopa 25/100, 1 tablet 6 times per day (sometimes the 6th dose is in the middle of the night) and amantadine, 100 mg twice per day.  "If I take my medication on time every 2 hours, it works well."  Reviewed labs after calling PCP for them.  No more recent labs than had already had 05/22/17. Does state that she quit working due to trouble typing and word finding trouble.  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  She is only exercising intermittently.  Some occasional tightness and pain in the L leg.  Some L knee pain and has chronic back pain.  Occasionally will feel lightheaded and on those days, will use the walker.  Usually stable with the cane.  Not drinking much water.  03/10/18 update: Patient seen today in follow-up for atypical parkinsonism.  She is on carbidopa/levodopa 25/100, 1  tablet 5-6 times per day and amantadine 100 mg twice per day.  She has had no falls since our last visit.  Given information last visit on the atypical support group and she has been going and she is pleased with that.   She had no falls.  Some lightheadedness but no syncope.  That part has been better recently.  Drinking lots of fluids.  She is not doing exercise.  She has a stationary bike but not using.  She notes chronic nausea that levodopa will make worse if doesn't take with carb.  She is considering giving up driving as she is missing turns all of the time (not getting lost or noting reduced reflex time).    08/25/18 update: Patient seen today in follow-up for atypical parkinsonism.  This patient is accompanied in the office by her spouse who supplements the history.She is on carbidopa/levodopa 25/100, 1 tablet 6 times per day.  She is also on amantadine, 100 mg twice per day for associated dyskinesia.  She fell in November on her face.  She turned in the kitchen and was in a rush and lost balance and hit her face and broke her 3 front teeth and required 3 caps on them.  Has trouble changing directions and gets more diplopia when does that, even though has prisms in the glasses.  Last eye dr 2 years ago.  Has chronic nausea and had emesis on the way here today.  States that she is taking hemp now and "it cured the incontinence and my blood pressure."  States that her n/v is 90% better.  Husband states that today was different because was rushed.  She is no longer driving.  Lots of trouble with constipation, despite linzess and miralax.  More crying.  More irritability per husband.    Current/Previously tried tremor medications: tried propranolol on one occasion but seemed to make her sick and she did not want to take the next pill  Current medications that may exacerbate tremor:  proair  Outside reports reviewed: historical medical records.  Allergies  Allergen Reactions  . Fosamax [Alendronate]  Other (See Comments)    headache  . Symbicort [Budesonide-Formoterol Fumarate] Other (See  Comments)    Shakes and muscle weakness    Current Outpatient Medications on File Prior to Visit  Medication Sig Dispense Refill  . amantadine (SYMMETREL) 100 MG capsule Take 1 capsule (100 mg total) by mouth 2 (two) times daily. 180 capsule 1  . carbidopa-levodopa (SINEMET IR) 25-100 MG tablet TAKE 1 TABLET BY MOUTH SIX TIMES DAILY 540 tablet 0  . furosemide (LASIX) 20 MG tablet Take 20 mg by mouth daily.    Marland Kitchen levothyroxine (SYNTHROID, LEVOTHROID) 88 MCG tablet Take 88 mcg by mouth at bedtime.    Marland Kitchen linaclotide (LINZESS) 72 MCG capsule Take 72 mcg by mouth daily before breakfast.     . Multiple Vitamin (MULTIVITAMIN WITH MINERALS) TABS tablet Take 2 tablets by mouth daily.    Marland Kitchen omeprazole (PRILOSEC OTC) 20 MG tablet Take 20 mg by mouth daily before breakfast.    . OVER THE COUNTER MEDICATION CBD oil - 8.3 mg daily    . VITAMIN D PO Take 2 drops by mouth daily.    . vitamin E 100 UNIT capsule Take by mouth daily. 2 tabs daily    . VOLTAREN 1 % GEL      No current facility-administered medications on file prior to visit.     Past Medical History:  Diagnosis Date  . Allergic rhinitis   . Anemia   . Arthritis   . Asthma   . Blood clot in vein  age 42   left leg  . Depression   . GERD (gastroesophageal reflux disease)   . Hyperlipidemia   . Hypertension    pt denies, in physician record  . Hypothyroidism   . Kidney stones   . Multiple pregnancy loss, not currently pregnant    multiple miscarriages-pt.unsure of #  . Multiple system atrophy ([place]) 2015  . Nephrolithiasis    hx of kidney stones, last 6 months ago  . Osteopenia   . Osteoporosis   . Parkinsonism ([place])   . Tendinitis    left foot  . Vitamin D deficiency     Past Surgical History:  Procedure Laterality Date  . BREAST EXCISIONAL BIOPSY Right   . BREAST SURGERY     rt. lumpectomy, benign  . colonscopy  2011  .  cystoscopy  2012  . JOINT REPLACEMENT  05-2007   right knee replacment/revision  . KIDNEY STONE SURGERY    . REPLACEMENT TOTAL KNEE Left 06/05/12  . TOTAL KNEE REVISION  06/03/2012   Procedure: TOTAL KNEE REVISION;  Surgeon: Gearlean Alf, MD;  Location: [facility];  Service: Orthopedics;  Laterality: Left;  Revision of a Left Uni Knee to a Total Knee Arthroplasty    Social History   Socioeconomic History  . Marital status: Married    Spouse name: Not on file  . Number of children: Not on file  . Years of education: Not on file  . Highest education level: Not on file  Occupational History  . Occupation: retired/disabled    Employer: [employer]    Comment: receptionist  Social Needs  . Financial resource strain: Not on file  . Food insecurity:    Worry: Not on file    Inability: Not on file  . Transportation needs:    Medical: Not on file    Non-medical: Not on file  Tobacco Use  . Smoking status: Never Smoker  . Smokeless tobacco: Never Used  Substance and Sexual Activity  . Alcohol use: No    Alcohol/week: 0.0 standard drinks  .  Drug use: No  . Sexual activity: Never    Partners: Male    Birth control/protection: Post-menopausal  Lifestyle  . Physical activity:    Days per week: Not on file    Minutes per session: Not on file  . Stress: Not on file  Relationships  . Social connections:    Talks on phone: Not on file    Gets together: Not on file    Attends religious service: Not on file    Active member of club or organization: Not on file    Attends meetings of clubs or organizations: Not on file    Relationship status: Not on file  . Intimate partner violence:    Fear of current or ex partner: Not on file    Emotionally abused: Not on file    Physically abused: Not on file    Forced sexual activity: Not on file  Other Topics Concern  . Not on file  Social History Narrative  . Not on file    Family Status  Relation Name Status  . Mother  Deceased        fall, death during surgery  . Father  Deceased       ? heart  . Brother  Alive       healthy  . Sister  Alive       2, asthma, heart problem  . Child  Alive       adopted, healthy  . Sister  Alive    Review of Systems Review of Systems  Constitutional: Negative.   HENT: Negative.   Eyes: Negative.   Gastrointestinal: Positive for constipation.  Genitourinary: Positive for urgency (improved).  Skin: Negative.   Psychiatric/Behavioral: The patient is nervous/anxious.       Objective:   VITALS:   Vitals:   08/25/18 1438  BP: (!) 142/82  Pulse: 80  SpO2: 95%   Wt Readings from Last 3 Encounters:  08/04/18 213 lb 4 oz (96.7 kg)  03/10/18 217 lb (98.4 kg)  10/08/17 218 lb (98.9 kg)   No data found.   Gen:  Appears stated age and in NAD. HEENT:  Normocephalic, atraumatic. The mucous membranes are moist. The superficial temporal arteries are without ropiness or tenderness. Cardiovascular: Regular rate and rhythm. Lungs: Clear to auscultation bilaterally. Neck: There are no carotid bruits noted bilaterally.  NEUROLOGICAL:  Orientation:  The patient is alert and oriented x 3.   Cranial nerves: There is good facial symmetry.  Extraocular muscles are intact and visual fields are full to confrontational testing. Speech is fluent and clear. Soft palate rises symmetrically and there is no tongue deviation. Hearing is intact to conversational tone. Tone: Tone is mild increased tone in left upper extremity. Coordination:  The patient has good rapid alternating movements today Motor: Strength is 5/5 in the bilateral upper and lower extremities.  Shoulder shrug is equal bilaterally.  There is no pronator drift.  There are no fasciculations noted. Gait and Station: The patient pushes off the chair to arise.  She walks well with her walker (which is really a transport chair/walker in one) but she is bent over it (arms of walker need raised).    MOVEMENT EXAM: Tremor:   There is  mild tremor in the LLE.  Rare jaw tremor.    Labs:  Received labs dated 05/22/17.  Sodium was 142, potassium 4.0, chloride 102, CO2 31, BUN 20 and creatinine 1.0.  White blood cells were 4.3, hemoglobin 13.8, hematocrit 41.3  and platelets 266.  TSH just slightly elevated at 4.54.   Assessment/Plan:   1.  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  She and I talked about the diagnosis.  She will continue on carbidopa/levodopa 25/100 6 times a day.  She looks good on this medication.    -Amantadine 100 mg bid has helped dyskinesia  -encouraged her to start exercise  -still going to atypical support group and loves that.    -thinks that hemp oil has helps n/v, urinary sx's   2.  Cognitive impairment  -As above, she had neuropsych testing done in January, 2016 and there was no evidence of dementia.  There was some evidence of mixed anxiety/depression and a brief trial of psychotherapy was recommended, but the patient felt that she really did not need that.  Feels that she still is having issues but doesn't wish to pursue counseling.  Is going to support group  3.  Diplopia  -Pt saw Dr. Sanda Klein at [facility] in regards to her diplopia.  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  He said that the diplopia is not a component directly of MSA, but rather perhaps a loss of a compensatory ability secondary to [place].  She has a temporary prism now and it has really helped but noticing more trouble now.  Told her that she needs to get back to ophthalmologist.    4.  Insomnia  -using melatonin, 3 mg and states that it is working  5.  Orthostatic hypotension  -better with improved hydration  6.  constipation  -on linzess and still having some trouble  -told her to get appt with gastroenterology.  She was supposed to have a colonoscopy but husband has many reservations.  Discussed that this is personal decision, but most of my MSA  patients get colonoscopies without an issue.  7.  GAD/depression  -states that used to be on citalopram, 10 mg daily and thinks that she needs to get back on it.  Asks for RX.  Risks, benefits, side effects and alternative therapies were discussed.  The opportunity to ask questions was given and they were answered to the best of my ability.  The patient expressed understanding and willingness to follow the outlined treatment protocols.  Discussed interaction between prilosec and citalopram but this is very low dose citalopram.  If need to go up, will check EKG for QT interval.    8.  Follow up is anticipated in the next few months, sooner should new neurologic issues arise.  Much greater than 50% of this visit was spent in counseling and coordinating care.  Total face to face time:  25 min

## 2018-08-25 ENCOUNTER — Encounter: Payer: Self-pay | Admitting: Neurology

## 2018-08-25 ENCOUNTER — Ambulatory Visit (INDEPENDENT_AMBULATORY_CARE_PROVIDER_SITE_OTHER): Payer: Medicare Other | Admitting: Neurology

## 2018-08-25 VITALS — BP 142/82 | HR 80

## 2018-08-25 DIAGNOSIS — G20 Parkinson's disease: Secondary | ICD-10-CM

## 2018-08-25 DIAGNOSIS — K5901 Slow transit constipation: Secondary | ICD-10-CM

## 2018-08-25 DIAGNOSIS — G249 Dystonia, unspecified: Secondary | ICD-10-CM | POA: Diagnosis not present

## 2018-08-25 DIAGNOSIS — F330 Major depressive disorder, recurrent, mild: Secondary | ICD-10-CM

## 2018-08-25 DIAGNOSIS — H532 Diplopia: Secondary | ICD-10-CM | POA: Diagnosis not present

## 2018-08-25 DIAGNOSIS — G232 Striatonigral degeneration: Secondary | ICD-10-CM | POA: Diagnosis not present

## 2018-08-25 DIAGNOSIS — F411 Generalized anxiety disorder: Secondary | ICD-10-CM

## 2018-08-25 MED ORDER — CITALOPRAM HYDROBROMIDE 10 MG PO TABS: 10.0000 mg | ORAL_TABLET | Freq: Every day | ORAL | 1 refills | Status: DC

## 2018-08-25 NOTE — Patient Instructions (Signed)
1.  Start citalopram - 10 mg daily 2.  Make appointment with Dr. Henrene Pastor for constipation 3.  Make appointment with ophthalmology for prisms/double vision 4.  I will see you in 6 months

## 2018-08-26 ENCOUNTER — Encounter: Payer: Medicare Other | Admitting: Internal Medicine

## 2018-08-31 DIAGNOSIS — M5030 Other cervical disc degeneration, unspecified cervical region: Secondary | ICD-10-CM | POA: Diagnosis not present

## 2018-08-31 DIAGNOSIS — M9901 Segmental and somatic dysfunction of cervical region: Secondary | ICD-10-CM | POA: Diagnosis not present

## 2018-08-31 DIAGNOSIS — M9903 Segmental and somatic dysfunction of lumbar region: Secondary | ICD-10-CM | POA: Diagnosis not present

## 2018-08-31 DIAGNOSIS — M5136 Other intervertebral disc degeneration, lumbar region: Secondary | ICD-10-CM | POA: Diagnosis not present

## 2018-09-07 DIAGNOSIS — M5136 Other intervertebral disc degeneration, lumbar region: Secondary | ICD-10-CM | POA: Diagnosis not present

## 2018-09-07 DIAGNOSIS — M5030 Other cervical disc degeneration, unspecified cervical region: Secondary | ICD-10-CM | POA: Diagnosis not present

## 2018-09-07 DIAGNOSIS — M9903 Segmental and somatic dysfunction of lumbar region: Secondary | ICD-10-CM | POA: Diagnosis not present

## 2018-09-07 DIAGNOSIS — M9901 Segmental and somatic dysfunction of cervical region: Secondary | ICD-10-CM | POA: Diagnosis not present

## 2018-09-14 DIAGNOSIS — M5136 Other intervertebral disc degeneration, lumbar region: Secondary | ICD-10-CM | POA: Diagnosis not present

## 2018-09-14 DIAGNOSIS — M5030 Other cervical disc degeneration, unspecified cervical region: Secondary | ICD-10-CM | POA: Diagnosis not present

## 2018-09-14 DIAGNOSIS — M9903 Segmental and somatic dysfunction of lumbar region: Secondary | ICD-10-CM | POA: Diagnosis not present

## 2018-09-14 DIAGNOSIS — M9901 Segmental and somatic dysfunction of cervical region: Secondary | ICD-10-CM | POA: Diagnosis not present

## 2018-09-15 ENCOUNTER — Ambulatory Visit: Payer: Medicare Other | Admitting: Neurology

## 2018-09-21 DIAGNOSIS — M9903 Segmental and somatic dysfunction of lumbar region: Secondary | ICD-10-CM | POA: Diagnosis not present

## 2018-09-21 DIAGNOSIS — M5030 Other cervical disc degeneration, unspecified cervical region: Secondary | ICD-10-CM | POA: Diagnosis not present

## 2018-09-21 DIAGNOSIS — M9901 Segmental and somatic dysfunction of cervical region: Secondary | ICD-10-CM | POA: Diagnosis not present

## 2018-09-21 DIAGNOSIS — M5136 Other intervertebral disc degeneration, lumbar region: Secondary | ICD-10-CM | POA: Diagnosis not present

## 2018-09-30 DIAGNOSIS — M9903 Segmental and somatic dysfunction of lumbar region: Secondary | ICD-10-CM | POA: Diagnosis not present

## 2018-09-30 DIAGNOSIS — M9901 Segmental and somatic dysfunction of cervical region: Secondary | ICD-10-CM | POA: Diagnosis not present

## 2018-09-30 DIAGNOSIS — M5136 Other intervertebral disc degeneration, lumbar region: Secondary | ICD-10-CM | POA: Diagnosis not present

## 2018-09-30 DIAGNOSIS — M5030 Other cervical disc degeneration, unspecified cervical region: Secondary | ICD-10-CM | POA: Diagnosis not present

## 2018-10-05 DIAGNOSIS — M9903 Segmental and somatic dysfunction of lumbar region: Secondary | ICD-10-CM | POA: Diagnosis not present

## 2018-10-05 DIAGNOSIS — M5136 Other intervertebral disc degeneration, lumbar region: Secondary | ICD-10-CM | POA: Diagnosis not present

## 2018-10-05 DIAGNOSIS — M5030 Other cervical disc degeneration, unspecified cervical region: Secondary | ICD-10-CM | POA: Diagnosis not present

## 2018-10-05 DIAGNOSIS — M9901 Segmental and somatic dysfunction of cervical region: Secondary | ICD-10-CM | POA: Diagnosis not present

## 2018-10-12 DIAGNOSIS — M9901 Segmental and somatic dysfunction of cervical region: Secondary | ICD-10-CM | POA: Diagnosis not present

## 2018-10-12 DIAGNOSIS — M5030 Other cervical disc degeneration, unspecified cervical region: Secondary | ICD-10-CM | POA: Diagnosis not present

## 2018-10-12 DIAGNOSIS — M9903 Segmental and somatic dysfunction of lumbar region: Secondary | ICD-10-CM | POA: Diagnosis not present

## 2018-10-12 DIAGNOSIS — M5136 Other intervertebral disc degeneration, lumbar region: Secondary | ICD-10-CM | POA: Diagnosis not present

## 2018-10-19 DIAGNOSIS — M9901 Segmental and somatic dysfunction of cervical region: Secondary | ICD-10-CM | POA: Diagnosis not present

## 2018-10-19 DIAGNOSIS — M5030 Other cervical disc degeneration, unspecified cervical region: Secondary | ICD-10-CM | POA: Diagnosis not present

## 2018-10-19 DIAGNOSIS — M5136 Other intervertebral disc degeneration, lumbar region: Secondary | ICD-10-CM | POA: Diagnosis not present

## 2018-10-19 DIAGNOSIS — M9903 Segmental and somatic dysfunction of lumbar region: Secondary | ICD-10-CM | POA: Diagnosis not present

## 2018-10-26 DIAGNOSIS — M5136 Other intervertebral disc degeneration, lumbar region: Secondary | ICD-10-CM | POA: Diagnosis not present

## 2018-10-26 DIAGNOSIS — M9901 Segmental and somatic dysfunction of cervical region: Secondary | ICD-10-CM | POA: Diagnosis not present

## 2018-10-26 DIAGNOSIS — M5030 Other cervical disc degeneration, unspecified cervical region: Secondary | ICD-10-CM | POA: Diagnosis not present

## 2018-10-26 DIAGNOSIS — M9903 Segmental and somatic dysfunction of lumbar region: Secondary | ICD-10-CM | POA: Diagnosis not present

## 2018-11-09 DIAGNOSIS — M5030 Other cervical disc degeneration, unspecified cervical region: Secondary | ICD-10-CM | POA: Diagnosis not present

## 2018-11-09 DIAGNOSIS — M9901 Segmental and somatic dysfunction of cervical region: Secondary | ICD-10-CM | POA: Diagnosis not present

## 2018-11-09 DIAGNOSIS — M9903 Segmental and somatic dysfunction of lumbar region: Secondary | ICD-10-CM | POA: Diagnosis not present

## 2018-11-09 DIAGNOSIS — M5136 Other intervertebral disc degeneration, lumbar region: Secondary | ICD-10-CM | POA: Diagnosis not present

## 2018-11-18 DIAGNOSIS — M9901 Segmental and somatic dysfunction of cervical region: Secondary | ICD-10-CM | POA: Diagnosis not present

## 2018-11-18 DIAGNOSIS — M5136 Other intervertebral disc degeneration, lumbar region: Secondary | ICD-10-CM | POA: Diagnosis not present

## 2018-11-18 DIAGNOSIS — M5030 Other cervical disc degeneration, unspecified cervical region: Secondary | ICD-10-CM | POA: Diagnosis not present

## 2018-11-18 DIAGNOSIS — M9903 Segmental and somatic dysfunction of lumbar region: Secondary | ICD-10-CM | POA: Diagnosis not present

## 2018-11-23 DIAGNOSIS — M9903 Segmental and somatic dysfunction of lumbar region: Secondary | ICD-10-CM | POA: Diagnosis not present

## 2018-11-23 DIAGNOSIS — M9901 Segmental and somatic dysfunction of cervical region: Secondary | ICD-10-CM | POA: Diagnosis not present

## 2018-11-23 DIAGNOSIS — M5136 Other intervertebral disc degeneration, lumbar region: Secondary | ICD-10-CM | POA: Diagnosis not present

## 2018-11-23 DIAGNOSIS — M5030 Other cervical disc degeneration, unspecified cervical region: Secondary | ICD-10-CM | POA: Diagnosis not present

## 2018-12-02 DIAGNOSIS — M5136 Other intervertebral disc degeneration, lumbar region: Secondary | ICD-10-CM | POA: Diagnosis not present

## 2018-12-02 DIAGNOSIS — M9903 Segmental and somatic dysfunction of lumbar region: Secondary | ICD-10-CM | POA: Diagnosis not present

## 2018-12-02 DIAGNOSIS — M5030 Other cervical disc degeneration, unspecified cervical region: Secondary | ICD-10-CM | POA: Diagnosis not present

## 2018-12-02 DIAGNOSIS — M9901 Segmental and somatic dysfunction of cervical region: Secondary | ICD-10-CM | POA: Diagnosis not present

## 2018-12-11 DIAGNOSIS — M9901 Segmental and somatic dysfunction of cervical region: Secondary | ICD-10-CM | POA: Diagnosis not present

## 2018-12-11 DIAGNOSIS — M5136 Other intervertebral disc degeneration, lumbar region: Secondary | ICD-10-CM | POA: Diagnosis not present

## 2018-12-11 DIAGNOSIS — M5030 Other cervical disc degeneration, unspecified cervical region: Secondary | ICD-10-CM | POA: Diagnosis not present

## 2018-12-11 DIAGNOSIS — M9903 Segmental and somatic dysfunction of lumbar region: Secondary | ICD-10-CM | POA: Diagnosis not present

## 2018-12-15 DIAGNOSIS — H5203 Hypermetropia, bilateral: Secondary | ICD-10-CM | POA: Diagnosis not present

## 2018-12-16 DIAGNOSIS — M5136 Other intervertebral disc degeneration, lumbar region: Secondary | ICD-10-CM | POA: Diagnosis not present

## 2018-12-16 DIAGNOSIS — M9903 Segmental and somatic dysfunction of lumbar region: Secondary | ICD-10-CM | POA: Diagnosis not present

## 2018-12-16 DIAGNOSIS — M9901 Segmental and somatic dysfunction of cervical region: Secondary | ICD-10-CM | POA: Diagnosis not present

## 2018-12-16 DIAGNOSIS — M5030 Other cervical disc degeneration, unspecified cervical region: Secondary | ICD-10-CM | POA: Diagnosis not present

## 2018-12-19 ENCOUNTER — Other Ambulatory Visit: Payer: Self-pay | Admitting: Neurology

## 2018-12-22 ENCOUNTER — Other Ambulatory Visit: Payer: Self-pay | Admitting: Neurology

## 2018-12-22 NOTE — Telephone Encounter (Signed)
Requested Prescriptions   Pending Prescriptions Disp Refills  . amantadine (SYMMETREL) 100 MG capsule [Pharmacy Med Name: Amantadine HCl 100 MG Oral Capsule] 180 capsule 0    Sig: Take 1 capsule by mouth twice daily   Rx last filled:TODAY  Pt last seen:08/25/18  Follow up appt JJHERDEYC:08/01/46 DENIED DUPLICATE REQUEST

## 2018-12-22 NOTE — Telephone Encounter (Signed)
Requested Prescriptions   Pending Prescriptions Disp Refills  . carbidopa-levodopa (SINEMET IR) 25-100 MG tablet [Pharmacy Med Name: Carbidopa-Levodopa 25-100 MG Oral Tablet] 540 tablet 0    Sig: TAKE 1 TABLET BY MOUTH SIX TIMES DAILY  . amantadine (SYMMETREL) 100 MG capsule [Pharmacy Med Name: Amantadine HCl 100 MG Oral Capsule] 180 capsule 0    Sig: Take 1 capsule by mouth twice daily   Rx last filled:06/10/18 #180 1 refills (amantadine) 08/06/18 (sinemet) #180 1 refills   Pt last seen:08/25/18  Assessment/Plan:   1.  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  She and I talked about the diagnosis.  She will continue on carbidopa/levodopa 25/100 6 times a day.  She looks good on this medication.               -Amantadine 100 mg bid has helped dyskinesia             -encouraged her to start exercise             -still going to atypical support group and loves that.               -thinks that hemp oil has helps n/v, urinary sx's    Follow up appt scheduled:03/05/19

## 2018-12-22 NOTE — Telephone Encounter (Signed)
Message was left with the after hour answering service on 12-19-18 @ 3:52 pm    Caller states that she needs a urgent refill on carbidopa levodopa caller states that she is currently non symptomatic provided office hours, but caller states that it can not wait    Rolan Lipa RN attempted to call and left messages at 3:52 pm 3:53 pm and 4:04 pm

## 2018-12-23 DIAGNOSIS — M5136 Other intervertebral disc degeneration, lumbar region: Secondary | ICD-10-CM | POA: Diagnosis not present

## 2018-12-23 DIAGNOSIS — M5030 Other cervical disc degeneration, unspecified cervical region: Secondary | ICD-10-CM | POA: Diagnosis not present

## 2018-12-23 DIAGNOSIS — M9901 Segmental and somatic dysfunction of cervical region: Secondary | ICD-10-CM | POA: Diagnosis not present

## 2018-12-23 DIAGNOSIS — M9903 Segmental and somatic dysfunction of lumbar region: Secondary | ICD-10-CM | POA: Diagnosis not present

## 2018-12-28 DIAGNOSIS — M9903 Segmental and somatic dysfunction of lumbar region: Secondary | ICD-10-CM | POA: Diagnosis not present

## 2018-12-28 DIAGNOSIS — M9901 Segmental and somatic dysfunction of cervical region: Secondary | ICD-10-CM | POA: Diagnosis not present

## 2018-12-28 DIAGNOSIS — M5030 Other cervical disc degeneration, unspecified cervical region: Secondary | ICD-10-CM | POA: Diagnosis not present

## 2018-12-28 DIAGNOSIS — M5136 Other intervertebral disc degeneration, lumbar region: Secondary | ICD-10-CM | POA: Diagnosis not present

## 2019-01-04 DIAGNOSIS — M5136 Other intervertebral disc degeneration, lumbar region: Secondary | ICD-10-CM | POA: Diagnosis not present

## 2019-01-04 DIAGNOSIS — M9901 Segmental and somatic dysfunction of cervical region: Secondary | ICD-10-CM | POA: Diagnosis not present

## 2019-01-04 DIAGNOSIS — M9903 Segmental and somatic dysfunction of lumbar region: Secondary | ICD-10-CM | POA: Diagnosis not present

## 2019-01-04 DIAGNOSIS — M5030 Other cervical disc degeneration, unspecified cervical region: Secondary | ICD-10-CM | POA: Diagnosis not present

## 2019-01-11 DIAGNOSIS — M5136 Other intervertebral disc degeneration, lumbar region: Secondary | ICD-10-CM | POA: Diagnosis not present

## 2019-01-11 DIAGNOSIS — M5134 Other intervertebral disc degeneration, thoracic region: Secondary | ICD-10-CM | POA: Diagnosis not present

## 2019-01-11 DIAGNOSIS — M9902 Segmental and somatic dysfunction of thoracic region: Secondary | ICD-10-CM | POA: Diagnosis not present

## 2019-01-11 DIAGNOSIS — M9903 Segmental and somatic dysfunction of lumbar region: Secondary | ICD-10-CM | POA: Diagnosis not present

## 2019-01-18 DIAGNOSIS — M5136 Other intervertebral disc degeneration, lumbar region: Secondary | ICD-10-CM | POA: Diagnosis not present

## 2019-01-18 DIAGNOSIS — M5134 Other intervertebral disc degeneration, thoracic region: Secondary | ICD-10-CM | POA: Diagnosis not present

## 2019-01-18 DIAGNOSIS — M9902 Segmental and somatic dysfunction of thoracic region: Secondary | ICD-10-CM | POA: Diagnosis not present

## 2019-01-18 DIAGNOSIS — M9903 Segmental and somatic dysfunction of lumbar region: Secondary | ICD-10-CM | POA: Diagnosis not present

## 2019-01-25 DIAGNOSIS — M5136 Other intervertebral disc degeneration, lumbar region: Secondary | ICD-10-CM | POA: Diagnosis not present

## 2019-01-25 DIAGNOSIS — M9903 Segmental and somatic dysfunction of lumbar region: Secondary | ICD-10-CM | POA: Diagnosis not present

## 2019-01-25 DIAGNOSIS — M9902 Segmental and somatic dysfunction of thoracic region: Secondary | ICD-10-CM | POA: Diagnosis not present

## 2019-01-25 DIAGNOSIS — M5134 Other intervertebral disc degeneration, thoracic region: Secondary | ICD-10-CM | POA: Diagnosis not present

## 2019-01-26 ENCOUNTER — Other Ambulatory Visit: Payer: Self-pay | Admitting: Neurology

## 2019-01-26 NOTE — Telephone Encounter (Signed)
Requested Prescriptions   Pending Prescriptions Disp Refills  . carbidopa-levodopa (SINEMET IR) 25-100 MG tablet [Pharmacy Med Name: Carbidopa-Levodopa 25-100 MG Oral Tablet] 540 tablet 0    Sig: TAKE 1 TABLET BY MOUTH SIX TIMES DAILY   Rx last filled:12/22/18 #540 0 refills  Pt last seen:08/25/18  Follow up appt scheduled:03/05/19

## 2019-02-01 ENCOUNTER — Telehealth: Payer: Self-pay | Admitting: Neurology

## 2019-02-01 DIAGNOSIS — M9902 Segmental and somatic dysfunction of thoracic region: Secondary | ICD-10-CM | POA: Diagnosis not present

## 2019-02-01 DIAGNOSIS — M9903 Segmental and somatic dysfunction of lumbar region: Secondary | ICD-10-CM | POA: Diagnosis not present

## 2019-02-01 DIAGNOSIS — M5134 Other intervertebral disc degeneration, thoracic region: Secondary | ICD-10-CM | POA: Diagnosis not present

## 2019-02-01 DIAGNOSIS — M5136 Other intervertebral disc degeneration, lumbar region: Secondary | ICD-10-CM | POA: Diagnosis not present

## 2019-02-01 NOTE — Telephone Encounter (Signed)
I do know that she has chronic nausea, but what makes her think that its the medication?  I reviewed my notes and she didn't have that for many years as a complaint with the levodopa so I'm concerned it is not just the medication.  Let's have her d/c the carbidopa/levodopa 25/100 IR and change her to the carbidopa/levodopa 25/100 CR (still take it the same) and see if she does any better.  Make sure that she is seeing a GI doctor though as well.

## 2019-02-01 NOTE — Telephone Encounter (Signed)
Called spoke with patient   Pt c/o:  side effect on medication New medication started: carbidopa-levodopa 25/100 mg 6 times a day 10AM/1PM/4PM/7PM/10PM/12AM, Amantadine 100 MG BID 10AM/7PM When did they start medication?  4 years now When did side effects start?  Side effects reported: nausea, vomiting, weight loss, indigestion  Still taking medication? Yes.   Pt recently reduce to 5 times a day  Pt would like to know if there is anything that you would suggestion for nausea or change in medication. Or if she should contact PCP

## 2019-02-01 NOTE — Telephone Encounter (Signed)
New Message  Patient verbalized she is having physical problems and would like for nurse or MD to call her back.  Patient would not go into details about physical problems.

## 2019-02-02 MED ORDER — CARBIDOPA-LEVODOPA ER 25-100 MG PO TBCR: 1.0000 | EXTENDED_RELEASE_TABLET | Freq: Every day | ORAL | 1 refills | Status: DC

## 2019-02-02 NOTE — Addendum Note (Signed)
Addended by: Ranae Plumber on: 02/02/2019 11:14 AM   Modules accepted: Orders

## 2019-02-02 NOTE — Telephone Encounter (Signed)
Called patient no answer left message to call office back regarding message below

## 2019-02-02 NOTE — Telephone Encounter (Signed)
Pt return call she was informed of provider message below. She will contact GI provider to follow up. Rx for carbidopa-levodopa CR 25/100 MG 6 times a day was sent to patient pharmacy she was made aware of this.  Pharmacy verified

## 2019-02-03 ENCOUNTER — Telehealth: Payer: Self-pay | Admitting: Neurology

## 2019-02-03 NOTE — Telephone Encounter (Signed)
Called spoke with patient she was informed of provider response and will try [facility] discount card. She will keep off posted if and when she starts medication.

## 2019-02-03 NOTE — Telephone Encounter (Signed)
Called spoke with patient she is requesting information on a patient assistance program that will help with the cost of the new Rx carbidopa-levodopa. Pt says she has had help with other prescriptions in the past and was wonder if there was a program to help with the cost of this Rx.   Please advise  Rx cost is over $100

## 2019-02-03 NOTE — Telephone Encounter (Signed)
I don't but that is really expensive for this drug (like the out of pocket cost for the drug is the same on [facility] as what she is quoting).  I haven't had others complaining of the cost of this med with insurance.  It is a generic.  She may want to call insurance co and find out why so expensive and/or if she can get a tier exception since she already tried the carbidopa/levodopa IR

## 2019-02-03 NOTE — Telephone Encounter (Signed)
Left message with after hour service on 02-03-19 @ 12:24 pm   Caller states that she needs to speak with the nurse of Dr Tat

## 2019-02-08 DIAGNOSIS — M9902 Segmental and somatic dysfunction of thoracic region: Secondary | ICD-10-CM | POA: Diagnosis not present

## 2019-02-08 DIAGNOSIS — M5136 Other intervertebral disc degeneration, lumbar region: Secondary | ICD-10-CM | POA: Diagnosis not present

## 2019-02-08 DIAGNOSIS — M9903 Segmental and somatic dysfunction of lumbar region: Secondary | ICD-10-CM | POA: Diagnosis not present

## 2019-02-08 DIAGNOSIS — M5134 Other intervertebral disc degeneration, thoracic region: Secondary | ICD-10-CM | POA: Diagnosis not present

## 2019-02-15 DIAGNOSIS — M9904 Segmental and somatic dysfunction of sacral region: Secondary | ICD-10-CM | POA: Diagnosis not present

## 2019-02-15 DIAGNOSIS — M9901 Segmental and somatic dysfunction of cervical region: Secondary | ICD-10-CM | POA: Diagnosis not present

## 2019-02-15 DIAGNOSIS — M9903 Segmental and somatic dysfunction of lumbar region: Secondary | ICD-10-CM | POA: Diagnosis not present

## 2019-02-15 DIAGNOSIS — M5136 Other intervertebral disc degeneration, lumbar region: Secondary | ICD-10-CM | POA: Diagnosis not present

## 2019-02-19 ENCOUNTER — Telehealth: Payer: Self-pay

## 2019-02-19 NOTE — Telephone Encounter (Signed)
Patient states some of her medications were changed and she no longer has any gi problems.  Requested appointment be cancelled

## 2019-02-22 DIAGNOSIS — M9903 Segmental and somatic dysfunction of lumbar region: Secondary | ICD-10-CM | POA: Diagnosis not present

## 2019-02-22 DIAGNOSIS — M5136 Other intervertebral disc degeneration, lumbar region: Secondary | ICD-10-CM | POA: Diagnosis not present

## 2019-02-22 DIAGNOSIS — M9904 Segmental and somatic dysfunction of sacral region: Secondary | ICD-10-CM | POA: Diagnosis not present

## 2019-02-22 DIAGNOSIS — M9901 Segmental and somatic dysfunction of cervical region: Secondary | ICD-10-CM | POA: Diagnosis not present

## 2019-02-25 ENCOUNTER — Ambulatory Visit: Payer: Medicare Other | Admitting: Internal Medicine

## 2019-03-01 DIAGNOSIS — M5136 Other intervertebral disc degeneration, lumbar region: Secondary | ICD-10-CM | POA: Diagnosis not present

## 2019-03-01 DIAGNOSIS — M9904 Segmental and somatic dysfunction of sacral region: Secondary | ICD-10-CM | POA: Diagnosis not present

## 2019-03-01 DIAGNOSIS — M9903 Segmental and somatic dysfunction of lumbar region: Secondary | ICD-10-CM | POA: Diagnosis not present

## 2019-03-01 DIAGNOSIS — M9901 Segmental and somatic dysfunction of cervical region: Secondary | ICD-10-CM | POA: Diagnosis not present

## 2019-03-05 ENCOUNTER — Ambulatory Visit: Payer: Medicare Other | Admitting: Neurology

## 2019-03-08 DIAGNOSIS — M9904 Segmental and somatic dysfunction of sacral region: Secondary | ICD-10-CM | POA: Diagnosis not present

## 2019-03-08 DIAGNOSIS — M9903 Segmental and somatic dysfunction of lumbar region: Secondary | ICD-10-CM | POA: Diagnosis not present

## 2019-03-08 DIAGNOSIS — M9901 Segmental and somatic dysfunction of cervical region: Secondary | ICD-10-CM | POA: Diagnosis not present

## 2019-03-08 DIAGNOSIS — M5136 Other intervertebral disc degeneration, lumbar region: Secondary | ICD-10-CM | POA: Diagnosis not present

## 2019-03-08 NOTE — Progress Notes (Signed)
bSubjective:   Rachel [name] was seen in consultation in the movement disorder clinic at the request of Marton Redwood, MD.  The evaluation is for tremor.  The patient is a 68 y.o. right handed female with a history of tremor.  The pt reports that she first noted shaking in the legs and arms bilaterally about 1 - 1.5 years ago.  It was intermittent.  She now notices tremor of the hands, legs and head.  She has better and worse days.  She notes that lying in the recliner helps.  She notes that walking and exercise makes it worse.  The L leg feels that it will stiffen up and cause the tremor.  There is a family hx of tremor in her mother but it was undiagnosed.    Affected by caffeine:  yes (quit drinking caffeine all together) Exacerbated by albuterol:  Yes Affected by alcohol:  unknown Affected by stress:  yes Affected by fatigue:  yes Spills soup if on spoon:  yes Spills glass of liquid if full:  yes Affects ADL's (tying shoes, brushing teeth, etc):  no Affects ability to put on makeup:  Yes  09/1813 update:  The patient is following up today regarding tremor.  She is accompanied by her spouse, who supplements the history.  I have not seen the patient since her first appointment in August, 2014.  At that point in time, she did not want any medication for the tremor.  She did have an MRI of the brain in August, 2014 that was normal.  I did review this.  Over the course of time, she has noted tremor in the L foot and hand.  She states that if she uses the hands, her L leg will stiffen and will eventually hurt.  Minimal neck pain.  Has back pain but thinks that is independent.  Her bladder/bowel work okay.  Tremor is much better in the hands, L more than R, but it is better now that she is taking herbs that her chiropractor gave her.  She is receiving kinesiology treatment.  She feels that her walking is "awkward" because of the foot.  She has trouble with arm swing.  She reports some shakiness with  handwriting (R hand dominant) but no micrographia.  She is sleeping well.  She has no vivid dreams.  No sleep walking/talking.  No diplopia.  Does have hx of scintillating scotomas that is sometimes associated with migraine.  Denies swallowing problems.  No speech changes per pt but husband states that sometimes pt inserts one word for another or has word finding trouble.  Has "no smell" but able to taste, but thinks that there is some loss because of not being able to taste food.  She admits that she has been researching various causes of her symptoms, including Parkinson's and dystonias.  She requests a DaT scan.  11/19/13 update:  The patient is following up today, accompanied by her husband who helps to supplement the history.  Since our last visit, the patient did undergo a DaT scan.  No films are available.  However, I did get the report.  There was asymmetric uptake, with no uptake in the right putamen and normal on the left.  Pt opted to hold on the MRIs ordered last visit.  We did start carbidopa/levodopa last visit and the patient states that it is about 30% beneficial for her symptoms.   03/21/14 update:  This patient is accompanied in the office by her spouse who supplements  the history.  Last visit, I increased the patients carbidopa/levodopa 25/100 to qid but she hasn't really need to do that.  She is overall doing much better.  I did send her for PT/OT and that has helped.   She has had some constipation and been on miralax for that.  She states that she had severe "brain fog" yesterday and thinks that it is related to the levodopa.  She was driving and was confused about where she was.  She has had some other minor episodes prior to that of feeling confused but nothing like that.  She states that her chiropracter gave her "brain medicine" (2 of them, one she calls fito-brain B12) and she states that medicine allowed her arm to swing. She sees her chiropractor daily now and is very pleased with her  services.  The levodopa did help so her leg no longer tremors.   She does think that she has had her thyroid checked this year and states that her synthroid was increased last visit but cannot remember when that was.    07/11/14 update:  The patient is following up today, accompanied by her husband who supplements the history.  The patient is currently on carbidopa/levodopa 25/100, 4 times per day (went back up from 3 times per day because of "jerking" and foot dystonia).  I reviewed records since her last visit.  She was in her wife for vacation in October when she called me.  She stated that she initially had a scratchy throat and then suddenly she was not able to taste.  This sounded viral to me, but I told her that nonetheless it was not related to [place], as symptoms do not come on that acutely.  I told her that she could follow up with a physician in [place].   She states that it took 6-8 weeks to go away, but her husband got the same thing.   She is not falling currently but thinks that her balance is really not good.  She cannot remember the last PT date.  She still has trouble with her memory.  That is her biggest c/o.  She is having word finding trouble.    12/02/14 update:  The patient is f/u today.  She is on carbidopa/levodopa 25/100 four times a day.  Her sister in law died a few weeks ago after complications from a fall.  This was unexpected.  She was only in her 54's.  She went for neuropsych testing since our last visit.  No evidence of dementia.  Evidence of adjustment disorder with mixed anxiety/depression and felt that she may benefit from brief trial of psychotherapy.  Pt states that she is doing well in that regard and does not think she needs the therapy right now and she did start some physical therapy since last visit, but she states that she did not end up completing it because something else came up, but feels that she has been doing well.  Her left foot only turns and when she is very  tired.  She has diplopia when looking left.  She has an appt with neuro-opth (Dr. Hassell Done) at the end of may.    04/13/15 update:  The patient is following up today regarding her MSA.  She is carbidopa/levodopa 25/100 25/100, 1 tablet 4 times per day.  Overall, the patient reports that she is doing well.  She has gotten a new job at the [employer] since our last visit.  She is working at  the corporate office as a receptionist.  She is having to multitask which is good for her.  Since her last visit, she did see Dr. Sanda Klein at [facility] in regards to her diplopia.  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  He said that the diplopia is not a component directly of MSA, but rather perhaps a loss of a compensatory ability secondary to [place].  He did not know why the symptoms were just with left gaze as the same dissociation occurred in both right and left gaze.  The patient was not interested in surgery, but he did recommend prisms and the patient was going to follow-up with Dr. Ellie Lunch at [facility] ophthalmology to discuss that further.  She has a temporary prism now and she can drive again now with it and she loves that.  She asks me about a permanent license plate (handicap) instead of the placard as she forgets to hang the placard sometimes.  She has had no falls but she has to be very careful.  She runs into walls a lot.  She is doing okay with swallowing (nothing worse than usual per pt).  She is walking and riding her bike for exercise.  If she doesn't her L leg will stiffen up.  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  She is on carbidopa/levodopa 25/100, 1 tablet 4 times per day.  She has had more tremor of the L leg and "it just drives me crazy."  She has had more constipation and miralax became less effective.  She has not had any falls since last visit.  No hallucinations.  No lightheadedness or near syncope.   Physical therapy notes indicate that the patient canceled multiple visits and ultimately was discharged because of that.  She states that at the time she felt that she didn't need it but now she has gotten weaker.  She has backed down on the supplements because she was getting sick; she is just taking Calium, D3 and still taking the "dopatone."  She is also very tired because she is not sleeping well.  10/12/15 update:  Pt f/u today re: MSA.  She is on carbidopa/levodopa 25/100 four times a day (9am/2:30pm/7pm/11pm) and I did recommend that she stop her dopatone supplement.  She is still on this but might take it every other day.  She is taking other supplements as well ("glutathione recycler").  She does state that her chiropractor gave her a different supplement and told her to d/c dopatone and told her to take [facility] glutathione and "it made a great difference."  She is taking it a bit after the meal times.  She was given the rancho recipe for constipation and I recommended that she start melatonin for insomnia.  She didn't try the rancho recipe because bran had gluten and gluten gives her headaches so she mixes prunes juice with miralax and uses a laxative tea.  She reports today that she has not had any falls.  She has not had any hallucinations or near syncope.  She was sent for PT at last visit but then refused therapy when contacted by them.  She is not exercising regularly but is occasionally riding her bike.  She works 4.5 daily.  02/08/16 update:  Pt f/u today re: MSA.  This patient is accompanied in the office by her spouse who supplements the history.  She was on carbidopa/levodopa 25/100 four times a day  last visit.  She called after our last visit and stated that she felt somewhat more stiff and I told her she could try an extra half tablet as needed.  She states that she has increased it to one tablet 5 times a day.  Feels that she has been weaker.  L foot been moving more and "drives me crazy."   She  has been using the indoor bike.  She denies any falls but she is having near falls.  Asks for RX for walker.    She denies any lightheadedness or near syncope.  No hallucinations.  Mood has been good per pt and husband.    Using melatonin for insomnia.  03/21/16 update:  Patient is seen today.  She remains on carbidopa/levodopa 25/100, 1 tablet 5 times per day.  Last visit, I started her on amantadine, 100 mg twice a day for dyskinesia.  She states that it helped the dyskinesia, but she thought it caused headache.  This would be an unusual side effect, but ended up calling her in a Medrol Dosepak.  She states that never even had to fill that RX because the headaches went away and she does think that it was allergy related.  She came today because her muscles are painful and hurting.  All month she has been struggling to even get off the couch to walk.  Her muscles are burning when they walk but not when she sits.  She has not had any falls since last visit. Last labs were 6-8 months ago.  I did give her a prescription for a Rollator a few weeks ago and she states that she has not gotten that yet.  She has not had any hallucinations.  No lightheadedness or near syncope.  Is having issues with constipation.  Tried rancho recipe.  On linzess.  4 colace per day without relief.  Even had n/v because of it.  Having trouble getting eyeliner on due to tremor.  06/13/16 update:  Patient seen in follow-up today.  She is on carbidopa/levodopa 25/100, one tablet 5 times per day.  She is on amantadine, 100 mg twice per day.  Pt denies falls but she has gotten close.  She has a rollator but doesn't like to use it at work.  Pt denies lightheadedness, near syncope.  No hallucinations.  Mood has been good.  She has had intermittent nausea for which Dr. Brigitte Pulse had been prescribing Zofran.  She called here and wanted me to prescribe that medication, but I told her I would prefer that come from the original prescriber.  States that  she has a routine now and she takes medication slowly in the AM and that helped her stomach.  I gave her a bowel purge recipe last visit and she states that it worked but she also states that she is using linzess and that has really helped.  I gave her samples of lyrica last visit for paresthesias and diffuse muscle pain but she read the package insert and she was too scared to try it.    12/04/16 update:  Patient seen in follow-up today.  This patient is accompanied in the office by her spouse who supplements the history.  She is on carbidopa/levodopa 25/100, one tablet 5 times per day and occasionally will take 6 times per day.  She is on amantadine, 100 mg twice per day.   She fell last month while walking the dog.  She was going off the sidewalk and the walker tipped  as the dog pulled her a little and she had a hairline fx in the hip.  She is seeing Dr. Maureen Ralphs and told no surgery but has to stay on the walker full time for 2 weeks and then can go back to the cane.  Having some low BP's that make her feel near syncopal  10/08/17 update: Patient is seen today in follow-up.  She remains on carbidopa/levodopa 25/100, 1 tablet 6 times per day (sometimes the 6th dose is in the middle of the night) and amantadine, 100 mg twice per day.  "If I take my medication on time every 2 hours, it works well."  Reviewed labs after calling PCP for them.  No more recent labs than had already had 05/22/17. Does state that she quit working due to trouble typing and word finding trouble.  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  She is only exercising intermittently.  Some occasional tightness and pain in the L leg.  Some L knee pain and has chronic back pain.  Occasionally will feel lightheaded and on those days, will use the walker.  Usually stable with the cane.  Not drinking much water.  03/10/18 update: Patient seen today in follow-up for atypical parkinsonism.  She is on carbidopa/levodopa 25/100, 1  tablet 5-6 times per day and amantadine 100 mg twice per day.  She has had no falls since our last visit.  Given information last visit on the atypical support group and she has been going and she is pleased with that.   She had no falls.  Some lightheadedness but no syncope.  That part has been better recently.  Drinking lots of fluids.  She is not doing exercise.  She has a stationary bike but not using.  She notes chronic nausea that levodopa will make worse if doesn't take with carb.  She is considering giving up driving as she is missing turns all of the time (not getting lost or noting reduced reflex time).    08/25/18 update: Patient seen today in follow-up for atypical parkinsonism.  This patient is accompanied in the office by her spouse who supplements the history.She is on carbidopa/levodopa 25/100, 1 tablet 6 times per day.  She is also on amantadine, 100 mg twice per day for associated dyskinesia.  She fell in November on her face.  She turned in the kitchen and was in a rush and lost balance and hit her face and broke her 3 front teeth and required 3 caps on them.  Has trouble changing directions and gets more diplopia when does that, even though has prisms in the glasses.  Last eye dr 2 years ago.  Has chronic nausea and had emesis on the way here today.  States that she is taking hemp now and "it cured the incontinence and my blood pressure."  States that her n/v is 90% better.  Husband states that today was different because was rushed.  She is no longer driving.  Lots of trouble with constipation, despite linzess and miralax.  More crying.  More irritability per husband.    03/09/19 update: Patient seen today in follow-up for MSA.  She called on July 6 stating that she thought levodopa was making her nauseated.  She did have a history of chronic nausea, but there was periods of time that she did not have nausea, so I wondered why she thought it was the levodopa.  I did recommend she follow-up  with gastroenterology, but  I also discontinued the immediate release levodopa and changed her to extended release.  She reports that the nausea got much better and she ended up canceling the GI appointment.  She states that she is doing much better although she still gets the dry heaves but "I am so much better."  She is currently on carbidopa/levodopa 25/100 CR, 1 tablet 6 times per day.  She is also on amantadine, 100 mg twice per day for dyskinesia.  In regards to mood, the patient was placed back on citalopram last visit.  She reports that she is doing better.  Current/Previously tried tremor medications: tried propranolol on one occasion but seemed to make her sick and she did not want to take the next pill; carbidopa/levodopa 25/100 IR (on for many years and ultimately felt source of nausea - better after d/c and changed to CR); amantadine  Current medications that may exacerbate tremor:  proair  Outside reports reviewed: historical medical records.  Allergies  Allergen Reactions  . Fosamax [Alendronate] Other (See Comments)    headache  . Symbicort [Budesonide-Formoterol Fumarate] Other (See Comments)    Shakes and muscle weakness    Current Outpatient Medications on File Prior to Visit  Medication Sig Dispense Refill  . amantadine (SYMMETREL) 100 MG capsule Take 1 capsule by mouth twice daily 180 capsule 0  . Carbidopa-Levodopa ER (SINEMET CR) 25-100 MG tablet controlled release Take 1 tablet by mouth 6 (six) times daily. 540 tablet 1  . citalopram (CELEXA) 10 MG tablet Take 1 tablet (10 mg total) by mouth daily. 90 tablet 1  . furosemide (LASIX) 20 MG tablet Take 20 mg by mouth daily.    Marland Kitchen levothyroxine (SYNTHROID, LEVOTHROID) 88 MCG tablet Take 88 mcg by mouth at bedtime.    Marland Kitchen linaclotide (LINZESS) 72 MCG capsule Take 72 mcg by mouth daily before breakfast.     . Multiple Vitamin (MULTIVITAMIN WITH MINERALS) TABS tablet Take 2 tablets by mouth daily.    Marland Kitchen omeprazole (PRILOSEC OTC)  20 MG tablet Take 20 mg by mouth daily before breakfast.    . OVER THE COUNTER MEDICATION CBD oil - 8.3 mg daily    . VITAMIN D PO Take 2 drops by mouth daily.    . vitamin E 100 UNIT capsule Take by mouth daily. 2 tabs daily    . VOLTAREN 1 % GEL      No current facility-administered medications on file prior to visit.     Past Medical History:  Diagnosis Date  . Allergic rhinitis   . Anemia   . Arthritis   . Asthma   . Blood clot in vein  age 40   left leg  . Depression   . GERD (gastroesophageal reflux disease)   . Hyperlipidemia   . Hypertension    pt denies, in physician record  . Hypothyroidism   . Kidney stones   . Multiple pregnancy loss, not currently pregnant    multiple miscarriages-pt.unsure of #  . Multiple system atrophy ([place]) 2015  . Nephrolithiasis    hx of kidney stones, last 6 months ago  . Osteopenia   . Osteoporosis   . Parkinsonism ([place])   . Tendinitis    left foot  . Vitamin D deficiency     Past Surgical History:  Procedure Laterality Date  . BREAST EXCISIONAL BIOPSY Right   . BREAST SURGERY     rt. lumpectomy, benign  . colonscopy  2011  . cystoscopy  2012  . JOINT REPLACEMENT  05-2007   right knee replacment/revision  . KIDNEY STONE SURGERY    . REPLACEMENT TOTAL KNEE Left 06/05/12  . TOTAL KNEE REVISION  06/03/2012   Procedure: TOTAL KNEE REVISION;  Surgeon: Gearlean Alf, MD;  Location: [facility];  Service: Orthopedics;  Laterality: Left;  Revision of a Left Uni Knee to a Total Knee Arthroplasty    Social History   Socioeconomic History  . Marital status: Married    Spouse name: Not on file  . Number of children: 1  . Years of education: Not on file  . Highest education level: Some college, no degree  Occupational History  . Occupation: retired/disabled    Employer: [employer]    Comment: receptionist  Social Needs  . Financial resource strain: Not on file  . Food insecurity    Worry: Not on file    Inability: Not on file   . Transportation needs    Medical: Not on file    Non-medical: Not on file  Tobacco Use  . Smoking status: Never Smoker  . Smokeless tobacco: Never Used  Substance and Sexual Activity  . Alcohol use: No    Alcohol/week: 0.0 standard drinks  . Drug use: No  . Sexual activity: Never    Partners: Male    Birth control/protection: Post-menopausal  Lifestyle  . Physical activity    Days per week: Not on file    Minutes per session: Not on file  . Stress: Not on file  Relationships  . Social Herbalist on phone: Not on file    Gets together: Not on file    Attends religious service: Not on file    Active member of club or organization: Not on file    Attends meetings of clubs or organizations: Not on file    Relationship status: Not on file  . Intimate partner violence    Fear of current or ex partner: Not on file    Emotionally abused: Not on file    Physically abused: Not on file    Forced sexual activity: Not on file  Other Topics Concern  . Not on file  Social History Narrative   Pt lives with spouse [name] at home, 1 adopted son    Family Status  Relation Name Status  . Mother  Deceased       fall, death during surgery  . Father  Deceased       ? heart  . Brother  Alive       healthy  . Sister  Alive       2, asthma, heart problem  . Child  Alive       adopted, healthy  . Sister  Alive    Review of Systems Review of Systems  Constitutional: Positive for weight loss (thinks due to prior n/v).  HENT: Negative.   Eyes: Positive for blurred vision (better with new RX).  Cardiovascular: Negative.   Gastrointestinal: Positive for nausea (better than it was) and vomiting (dry heaves).  Genitourinary: Negative.   Skin: Negative.   Endo/Heme/Allergies: Negative.       Objective:   VITALS:   Vitals:   03/09/19 1318  BP: 140/79  Pulse: 88  Temp: 98.5 F (36.9 C)  SpO2: 98%  Weight: 195 lb 9.6 oz (88.7 kg)  Height: 5\' 6"  (1.676 m)   Wt  Readings from Last 3 Encounters:  03/09/19 195 lb 9.6 oz (88.7 kg)  08/04/18 213 lb 4 oz (96.7  kg)  03/10/18 217 lb (98.4 kg)   No data found.   Gen:  Appears stated age and in NAD. HEENT:  Normocephalic, atraumatic. The mucous membranes are moist. The superficial temporal arteries are without ropiness or tenderness. Cardiovascular: Regular rate and rhythm. Lungs: Clear to auscultation bilaterally. Neck: There are no carotid bruits noted bilaterally.  NEUROLOGICAL:  Orientation:  The patient is alert and oriented x 3.   Cranial nerves: There is good facial symmetry.  Extraocular muscles are intact and visual fields are full to confrontational testing. Speech is fluent and clear. Soft palate rises symmetrically and there is no tongue deviation. Hearing is intact to conversational tone. Tone: Tone is mild increased tone in left upper extremity. Coordination:  The patient has good rapid alternating movements today Motor: Strength is 5/5 in the bilateral upper and lower extremities.  Shoulder shrug is equal bilaterally.  There is no pronator drift.  There are no fasciculations noted. Gait and Station: The patient pushes off the chair to arise.  She slightly drags the L leg with ambulation  MOVEMENT EXAM: Tremor:   There is occasional head tremor in the yes direction.  There is rare thumb tremor in both hands.       Assessment/Plan:   1.  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  She and I talked about the diagnosis.  She will continue on carbidopa/levodopa 25/100 6 times a day.  She looks good on this medication.    -Amantadine 100 mg bid has helped dyskinesia.  Pt asked if she could do tid if she wanted and I told her to let me know if she wanted to increase that and we would change the RX  -encouraged her to attend the online zoom group for atypical parkinsonism  -will try to get labs from Dr. Brigitte Pulse.  Last labs I have is from 2018   2.  Cognitive impairment  -As above,  she had neuropsych testing done in January, 2016 and there was no evidence of dementia.  There was some evidence of mixed anxiety/depression and a brief trial of psychotherapy was recommended, but the patient felt that she really did not need that.  Feels that she still is having issues but doesn't wish to pursue counseling.  Is going to support group  3.  Diplopia  -Pt saw Dr. Sanda Klein at [facility] in regards to her diplopia.  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  He said that the diplopia is not a component directly of MSA, but rather perhaps a loss of a compensatory ability secondary to [place].  She has a temporary prism now and it has really helped but noticing more trouble now.  Told her that she needs to get back to ophthalmologist.    4.  Insomnia  -using melatonin, 3 mg and states that it is working  5.  Orthostatic hypotension  -better with improved hydration  6.  constipation  -on linzess and still having some trouble  -told her to get appt with gastroenterology.  She was supposed to have a colonoscopy but husband has many reservations.  Discussed that this is personal decision, but most of my MSA patients get colonoscopies without an issue.  7.  GAD/depression  -doing better with citalopram.  Risks, benefits, side effects and alternative therapies were discussed.  The opportunity to ask questions was given and they were answered to the best of my ability.  The patient expressed understanding  and willingness to follow the outlined treatment protocols.  8.  Significant weight loss  -likely due to n/v.  Improved with changing to CR carbidopa/levodopa from IR.  Hopefully weight will stabalize.

## 2019-03-09 ENCOUNTER — Ambulatory Visit (INDEPENDENT_AMBULATORY_CARE_PROVIDER_SITE_OTHER): Payer: Medicare Other | Admitting: Neurology

## 2019-03-09 ENCOUNTER — Other Ambulatory Visit: Payer: Self-pay

## 2019-03-09 ENCOUNTER — Encounter: Payer: Self-pay | Admitting: Neurology

## 2019-03-09 VITALS — BP 140/79 | HR 88 | Temp 98.5°F | Ht 66.0 in | Wt 195.6 lb

## 2019-03-09 DIAGNOSIS — R634 Abnormal weight loss: Secondary | ICD-10-CM | POA: Diagnosis not present

## 2019-03-09 DIAGNOSIS — G232 Striatonigral degeneration: Secondary | ICD-10-CM

## 2019-03-22 DIAGNOSIS — M9901 Segmental and somatic dysfunction of cervical region: Secondary | ICD-10-CM | POA: Diagnosis not present

## 2019-03-22 DIAGNOSIS — M9904 Segmental and somatic dysfunction of sacral region: Secondary | ICD-10-CM | POA: Diagnosis not present

## 2019-03-22 DIAGNOSIS — M5136 Other intervertebral disc degeneration, lumbar region: Secondary | ICD-10-CM | POA: Diagnosis not present

## 2019-03-22 DIAGNOSIS — M9903 Segmental and somatic dysfunction of lumbar region: Secondary | ICD-10-CM | POA: Diagnosis not present

## 2019-03-29 DIAGNOSIS — M5136 Other intervertebral disc degeneration, lumbar region: Secondary | ICD-10-CM | POA: Diagnosis not present

## 2019-03-29 DIAGNOSIS — M9903 Segmental and somatic dysfunction of lumbar region: Secondary | ICD-10-CM | POA: Diagnosis not present

## 2019-03-29 DIAGNOSIS — M9902 Segmental and somatic dysfunction of thoracic region: Secondary | ICD-10-CM | POA: Diagnosis not present

## 2019-03-29 DIAGNOSIS — M5134 Other intervertebral disc degeneration, thoracic region: Secondary | ICD-10-CM | POA: Diagnosis not present

## 2019-03-30 ENCOUNTER — Telehealth: Payer: Self-pay | Admitting: Neurology

## 2019-03-30 MED ORDER — AMANTADINE HCL 100 MG PO CAPS: 100.0000 mg | ORAL_CAPSULE | Freq: Three times a day (TID) | ORAL | 0 refills | Status: DC

## 2019-03-30 NOTE — Telephone Encounter (Signed)
Requested Prescriptions   Pending Prescriptions Disp Refills  . amantadine (SYMMETREL) 100 MG capsule 180 capsule 0    Sig: Take 1 capsule (100 mg total) by mouth 2 (two) times daily.   Last office visit 03/09/19 Assessment/Plan:   1.  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  She and I talked about the diagnosis.  She will continue on carbidopa/levodopa 25/100 6 times a day.  She looks good on this medication.               -Amantadine 100 mg bid has helped dyskinesia.  Pt asked if she could do tid if she wanted and I told her to let me know if she wanted to increase that and we would change the RX   Rx changes and resent to the pharmacy for 1 po TID

## 2019-03-30 NOTE — Telephone Encounter (Signed)
Patient is needing a refill on her RX and increase dose. She left msg with after hours and this was all the info given. Thanks!

## 2019-03-30 NOTE — Telephone Encounter (Signed)
Called patient requesting additonal information regarding Rx and pharmacy. She didn't leave this information.

## 2019-04-01 ENCOUNTER — Other Ambulatory Visit: Payer: Self-pay | Admitting: Neurology

## 2019-04-01 NOTE — Telephone Encounter (Signed)
It looks like it was just sent on 03/30/19 for three times per day dosing.  Has she called her pharmacy?

## 2019-04-01 NOTE — Telephone Encounter (Signed)
Patient called for refills on Amantidine and reported she is ready to take it 3 times a day.  Pharmacy: [facility] [address]

## 2019-04-02 NOTE — Telephone Encounter (Signed)
I left message for patient to check at pharmacy and if not there to contact office back.

## 2019-04-07 DIAGNOSIS — M5136 Other intervertebral disc degeneration, lumbar region: Secondary | ICD-10-CM | POA: Diagnosis not present

## 2019-04-07 DIAGNOSIS — M9903 Segmental and somatic dysfunction of lumbar region: Secondary | ICD-10-CM | POA: Diagnosis not present

## 2019-04-07 DIAGNOSIS — M5134 Other intervertebral disc degeneration, thoracic region: Secondary | ICD-10-CM | POA: Diagnosis not present

## 2019-04-07 DIAGNOSIS — M9902 Segmental and somatic dysfunction of thoracic region: Secondary | ICD-10-CM | POA: Diagnosis not present

## 2019-04-14 DIAGNOSIS — M9902 Segmental and somatic dysfunction of thoracic region: Secondary | ICD-10-CM | POA: Diagnosis not present

## 2019-04-14 DIAGNOSIS — M9903 Segmental and somatic dysfunction of lumbar region: Secondary | ICD-10-CM | POA: Diagnosis not present

## 2019-04-14 DIAGNOSIS — M5134 Other intervertebral disc degeneration, thoracic region: Secondary | ICD-10-CM | POA: Diagnosis not present

## 2019-04-14 DIAGNOSIS — M5136 Other intervertebral disc degeneration, lumbar region: Secondary | ICD-10-CM | POA: Diagnosis not present

## 2019-04-19 DIAGNOSIS — M9903 Segmental and somatic dysfunction of lumbar region: Secondary | ICD-10-CM | POA: Diagnosis not present

## 2019-04-19 DIAGNOSIS — M5134 Other intervertebral disc degeneration, thoracic region: Secondary | ICD-10-CM | POA: Diagnosis not present

## 2019-04-19 DIAGNOSIS — M5136 Other intervertebral disc degeneration, lumbar region: Secondary | ICD-10-CM | POA: Diagnosis not present

## 2019-04-19 DIAGNOSIS — M9902 Segmental and somatic dysfunction of thoracic region: Secondary | ICD-10-CM | POA: Diagnosis not present

## 2019-04-22 DIAGNOSIS — R1030 Lower abdominal pain, unspecified: Secondary | ICD-10-CM | POA: Diagnosis not present

## 2019-04-22 DIAGNOSIS — R0609 Other forms of dyspnea: Secondary | ICD-10-CM | POA: Diagnosis not present

## 2019-04-22 DIAGNOSIS — R194 Change in bowel habit: Secondary | ICD-10-CM | POA: Diagnosis not present

## 2019-04-22 DIAGNOSIS — R634 Abnormal weight loss: Secondary | ICD-10-CM | POA: Diagnosis not present

## 2019-04-23 ENCOUNTER — Other Ambulatory Visit: Payer: Self-pay | Admitting: Internal Medicine

## 2019-04-23 ENCOUNTER — Ambulatory Visit
Admission: RE | Admit: 2019-04-23 | Discharge: 2019-04-23 | Disposition: A | Discharge status: Home / Self Care | Payer: Medicare Other | Source: Ambulatory Visit | Attending: Internal Medicine | Admitting: Internal Medicine

## 2019-04-23 DIAGNOSIS — N200 Calculus of kidney: Secondary | ICD-10-CM | POA: Diagnosis not present

## 2019-04-23 DIAGNOSIS — R194 Change in bowel habit: Secondary | ICD-10-CM

## 2019-04-23 DIAGNOSIS — R634 Abnormal weight loss: Secondary | ICD-10-CM

## 2019-04-23 DIAGNOSIS — K5730 Diverticulosis of large intestine without perforation or abscess without bleeding: Secondary | ICD-10-CM | POA: Diagnosis not present

## 2019-04-23 DIAGNOSIS — R1030 Lower abdominal pain, unspecified: Secondary | ICD-10-CM

## 2019-04-23 MED ORDER — IOPAMIDOL (ISOVUE-300) INJECTION 61%
100.0000 mL | Freq: Once | INTRAVENOUS | Status: AC | PRN
  Administered 2019-04-23: 100 mL via INTRAVENOUS

## 2019-04-26 DIAGNOSIS — M9902 Segmental and somatic dysfunction of thoracic region: Secondary | ICD-10-CM | POA: Diagnosis not present

## 2019-04-26 DIAGNOSIS — M9903 Segmental and somatic dysfunction of lumbar region: Secondary | ICD-10-CM | POA: Diagnosis not present

## 2019-04-26 DIAGNOSIS — M5134 Other intervertebral disc degeneration, thoracic region: Secondary | ICD-10-CM | POA: Diagnosis not present

## 2019-04-26 DIAGNOSIS — M5136 Other intervertebral disc degeneration, lumbar region: Secondary | ICD-10-CM | POA: Diagnosis not present

## 2019-05-03 DIAGNOSIS — M5134 Other intervertebral disc degeneration, thoracic region: Secondary | ICD-10-CM | POA: Diagnosis not present

## 2019-05-03 DIAGNOSIS — M9903 Segmental and somatic dysfunction of lumbar region: Secondary | ICD-10-CM | POA: Diagnosis not present

## 2019-05-03 DIAGNOSIS — M9902 Segmental and somatic dysfunction of thoracic region: Secondary | ICD-10-CM | POA: Diagnosis not present

## 2019-05-03 DIAGNOSIS — M5136 Other intervertebral disc degeneration, lumbar region: Secondary | ICD-10-CM | POA: Diagnosis not present

## 2019-05-12 DIAGNOSIS — M5136 Other intervertebral disc degeneration, lumbar region: Secondary | ICD-10-CM | POA: Diagnosis not present

## 2019-05-12 DIAGNOSIS — M9903 Segmental and somatic dysfunction of lumbar region: Secondary | ICD-10-CM | POA: Diagnosis not present

## 2019-05-12 DIAGNOSIS — M9902 Segmental and somatic dysfunction of thoracic region: Secondary | ICD-10-CM | POA: Diagnosis not present

## 2019-05-12 DIAGNOSIS — M5134 Other intervertebral disc degeneration, thoracic region: Secondary | ICD-10-CM | POA: Diagnosis not present

## 2019-05-17 DIAGNOSIS — M9903 Segmental and somatic dysfunction of lumbar region: Secondary | ICD-10-CM | POA: Diagnosis not present

## 2019-05-17 DIAGNOSIS — M5136 Other intervertebral disc degeneration, lumbar region: Secondary | ICD-10-CM | POA: Diagnosis not present

## 2019-05-17 DIAGNOSIS — M5134 Other intervertebral disc degeneration, thoracic region: Secondary | ICD-10-CM | POA: Diagnosis not present

## 2019-05-17 DIAGNOSIS — M9902 Segmental and somatic dysfunction of thoracic region: Secondary | ICD-10-CM | POA: Diagnosis not present

## 2019-05-26 DIAGNOSIS — M9903 Segmental and somatic dysfunction of lumbar region: Secondary | ICD-10-CM | POA: Diagnosis not present

## 2019-05-26 DIAGNOSIS — M9902 Segmental and somatic dysfunction of thoracic region: Secondary | ICD-10-CM | POA: Diagnosis not present

## 2019-05-26 DIAGNOSIS — M5136 Other intervertebral disc degeneration, lumbar region: Secondary | ICD-10-CM | POA: Diagnosis not present

## 2019-05-26 DIAGNOSIS — M5134 Other intervertebral disc degeneration, thoracic region: Secondary | ICD-10-CM | POA: Diagnosis not present

## 2019-06-02 DIAGNOSIS — M9902 Segmental and somatic dysfunction of thoracic region: Secondary | ICD-10-CM | POA: Diagnosis not present

## 2019-06-02 DIAGNOSIS — M5136 Other intervertebral disc degeneration, lumbar region: Secondary | ICD-10-CM | POA: Diagnosis not present

## 2019-06-02 DIAGNOSIS — M5134 Other intervertebral disc degeneration, thoracic region: Secondary | ICD-10-CM | POA: Diagnosis not present

## 2019-06-02 DIAGNOSIS — M9903 Segmental and somatic dysfunction of lumbar region: Secondary | ICD-10-CM | POA: Diagnosis not present

## 2019-06-04 DIAGNOSIS — E559 Vitamin D deficiency, unspecified: Secondary | ICD-10-CM | POA: Diagnosis not present

## 2019-06-04 DIAGNOSIS — E7849 Other hyperlipidemia: Secondary | ICD-10-CM | POA: Diagnosis not present

## 2019-06-09 DIAGNOSIS — M9903 Segmental and somatic dysfunction of lumbar region: Secondary | ICD-10-CM | POA: Diagnosis not present

## 2019-06-09 DIAGNOSIS — M9902 Segmental and somatic dysfunction of thoracic region: Secondary | ICD-10-CM | POA: Diagnosis not present

## 2019-06-09 DIAGNOSIS — M5136 Other intervertebral disc degeneration, lumbar region: Secondary | ICD-10-CM | POA: Diagnosis not present

## 2019-06-09 DIAGNOSIS — M5134 Other intervertebral disc degeneration, thoracic region: Secondary | ICD-10-CM | POA: Diagnosis not present

## 2019-06-11 DIAGNOSIS — E039 Hypothyroidism, unspecified: Secondary | ICD-10-CM | POA: Diagnosis not present

## 2019-06-11 DIAGNOSIS — E785 Hyperlipidemia, unspecified: Secondary | ICD-10-CM | POA: Diagnosis not present

## 2019-06-11 DIAGNOSIS — Z1331 Encounter for screening for depression: Secondary | ICD-10-CM | POA: Diagnosis not present

## 2019-06-11 DIAGNOSIS — Z0000 Encounter for general adult medical examination without abnormal findings: Secondary | ICD-10-CM | POA: Diagnosis not present

## 2019-06-11 DIAGNOSIS — I10 Essential (primary) hypertension: Secondary | ICD-10-CM | POA: Diagnosis not present

## 2019-06-12 ENCOUNTER — Other Ambulatory Visit: Payer: Self-pay | Admitting: Neurology

## 2019-06-14 ENCOUNTER — Telehealth: Payer: Self-pay | Admitting: Neurology

## 2019-06-14 ENCOUNTER — Other Ambulatory Visit: Payer: Self-pay | Admitting: Internal Medicine

## 2019-06-14 DIAGNOSIS — M810 Age-related osteoporosis without current pathological fracture: Secondary | ICD-10-CM

## 2019-06-14 NOTE — Telephone Encounter (Signed)
Pharmacy sent request and responded through them

## 2019-06-14 NOTE — Telephone Encounter (Signed)
Patient left msg with after hours about needing refill on her medication. That was all the info she left. Thanks!

## 2019-06-16 ENCOUNTER — Institutional Professional Consult (permissible substitution): Payer: Medicare Other | Admitting: Pulmonary Disease

## 2019-06-21 ENCOUNTER — Encounter: Payer: Self-pay | Admitting: Internal Medicine

## 2019-06-21 ENCOUNTER — Ambulatory Visit: Payer: Medicare Other | Admitting: Internal Medicine

## 2019-06-21 ENCOUNTER — Telehealth: Payer: Self-pay

## 2019-06-21 ENCOUNTER — Other Ambulatory Visit: Payer: Self-pay

## 2019-06-21 VITALS — BP 138/84 | HR 75 | Temp 97.5°F | Ht 66.0 in | Wt 181.6 lb

## 2019-06-21 DIAGNOSIS — J3089 Other allergic rhinitis: Secondary | ICD-10-CM

## 2019-06-21 DIAGNOSIS — J4520 Mild intermittent asthma, uncomplicated: Secondary | ICD-10-CM

## 2019-06-21 MED ORDER — MONTELUKAST SODIUM 10 MG PO TABS: 10.0000 mg | ORAL_TABLET | Freq: Every day | ORAL | 11 refills | Status: DC

## 2019-06-21 MED ORDER — FLUTICASONE PROPIONATE 50 MCG/ACT NA SUSP: 1.0000 | Freq: Every day | NASAL | 11 refills | Status: DC

## 2019-06-21 NOTE — Patient Instructions (Addendum)
The patient should have follow up scheduled with myself in 3 months.   Prior to next visit patient should have a full set of PFTs including (next available at [facility])  Spirometry with bronchodilator if obstructed Lung Volumes DLCO FeNO   Flonase - 1 spray on each side of your nose twice a day for first week, then 1 spray on each side.   Instructions for use:  If you also use a saline nasal spray or rinse, use that first.  Position the head with the chin slightly tucked. Use the right hand to spray into the left nostril and the right hand to spray into the left nostril.   Point the bottle away from the septum of your nose (cartilage that divides the two sides of your nose).   Hold the nostril closed on the opposite side from where you will spray  Spray once and gently sniff to pull the medicine into the higher parts of your nose.  Don't sniff too hard as the medicine will drain down the back of your throat instead.  Repeat with a second spray on the same side if prescribed.  Repeat on the other side of your nose.  What is GERD? Gastroesophageal reflux disease (GERD) is gastroesophageal reflux diseasewhich occurs when the lower esophageal sphincter (LES) opens spontaneously, for varying periods of time, or does not close properly and stomach contents rise up into the esophagus. GER is also called acid reflux or acid regurgitation, because digestive juices-called acids-rise up with the food. The esophagus is the tube that carries food from the mouth to the stomach. The LES is a ring of muscle at the bottom of the esophagus that acts like a valve between the esophagus and stomach.  When acid reflux occurs, food or fluid can be tasted in the back of the mouth. When refluxed stomach acid touches the lining of the esophagus it may cause a burning sensation in the chest or throat called heartburn or acid indigestion. Occasional reflux is common. Persistent reflux that occurs more than twice a  week is considered GERD, and it can eventually lead to more serious health problems. People of all ages can have GERD. Studies have shown that GERD may worsen or contribute to asthma, chronic cough, and pulmonary fibrosis.   What are the symptoms of GERD? The main symptom of GERD in adults is frequent heartburn, also called acid indigestion-burning-type pain in the lower part of the mid-chest, behind the breast bone, and in the mid-abdomen.  Not all reflux is acidic in nature, and many patients don't have heart burn at all. Sometimes it feels like a cough (either dry or with mucus), choking sensation, asthma, shortness of breath, waking up at night, frequent throat clearing, or trouble swallowing.    What causes GERD? The reason some people develop GERD is still unclear. However, research shows that in people with GERD, the LES relaxes while the rest of the esophagus is working. Anatomical abnormalities such as a hiatal hernia may also contribute to GERD. A hiatal hernia occurs when the upper part of the stomach and the LES move above the diaphragm, the muscle wall that separates the stomach from the chest. Normally, the diaphragm helps the LES keep acid from rising up into the esophagus. When a hiatal hernia is present, acid reflux can occur more easily. A hiatal hernia can occur in people of any age and is most often a normal finding in otherwise healthy people over age 44. Most of the time,  a hiatal hernia produces no symptoms.   Other factors that may contribute to GERD include - Obesity or recent weight gain - Pregnancy  - Smoking  - Diet - Certain medications  Common foods that can worsen reflux symptoms include: - carbonated beverages - artificial sweeteners - citrus fruits  - chocolate  - drinks with caffeine or alcohol  - fatty and fried foods  - garlic and onions  - mint flavorings  - spicy foods  - tomato-based foods, like spaghetti sauce, salsa, chili, and pizza   Lifestyle  Changes If you smoke, stop.  Avoid foods and beverages that worsen symptoms (see above.) Lose weight if needed.  Eat small, frequent meals.  Wear loose-fitting clothes.  Avoid lying down for 3 hours after a meal.  Raise the head of your bed 6 to 8 inches by securing wood blocks under the bedposts. Just using extra pillows will not help, but using a wedge-shaped pillow may be helpful.  Medications  H2 blockers, such as cimetidine (Tagamet HB), famotidine (Pepcid AC), nizatidine (Axid AR), and ranitidine (Zantac 75), decrease acid production. They are available in prescription strength and over-the-counter strength. These drugs provide short-term relief and are effective for about half of those who have GERD symptoms.  Proton pump inhibitors include omeprazole (Prilosec, Zegerid), lansoprazole (Prevacid), pantoprazole (Protonix), rabeprazole (Aciphex), and esomeprazole (Nexium), which are available by prescription. Prilosec is also available in over-the-counter strength. Proton pump inhibitors are more effective than H2 blockers and can relieve symptoms and heal the esophageal lining in almost everyone who has GERD.  Because drugs work in different ways, combinations of medications may help control symptoms. People who get heartburn after eating may take both antacids and H2 blockers. The antacids work first to neutralize the acid in the stomach, and then the H2 blockers act on acid production. By the time the antacid stops working, the H2 blocker will have stopped acid production. Your health care provider is the best source of information about how to use medications for GERD.   Points to Remember 1. You can have GERD without having heartburn. Your symptoms could include a dry cough, asthma symptoms, or trouble swallowing.  2. Taking medications daily as prescribed is important in controlling you symptoms.  Sometimes it can take up to 8 weeks to fully achieve the effects of the medications  prescribed.  3. Coughing related to GERD can be difficult to treat and is very frustrating!  However, it is important to stick with these medications and lifestyle modifications before pursuing more aggressive or invasive test and treatments.

## 2019-06-21 NOTE — Telephone Encounter (Signed)
Needs PFT and covid scheduling per ND. Aware first available is late January.   Thanks

## 2019-06-21 NOTE — Telephone Encounter (Signed)
Needs to be set up with spiro and feno clinic per ND.   Aware you are currently not in office. To be addressed upon return. Pt not due back until 3 months.   Thanks

## 2019-06-21 NOTE — Progress Notes (Signed)
Rachel [name]    [id number]    [birth year]  Primary Care Physician:Shaw, Gwyndolyn Saxon, MD  Referring Physician: Marton Redwood, MD [address] [place],  [place] [id number] Reason for Consultation: "cough" Date of Consultation: 06/21/2019  Chief complaint:   Chief Complaint  Patient presents with  . Consult    Consult for Asthma and dry cough. She reports the cough has been constant for the last 3 years. She is using ventolin abut 2x/day, every other day. Reports SOB with exertion.      HPI:  Rachel [name]  Pain is worse when she lays flat on her back.  Sitting up does improve it.  The pain is worse after she has exerted herself. Duration is 1-2 weeks.  It does not wake her up from her sleep.  She has been sleeping in a recliner due to the pain. No recent trauma or injury.  It does radiate to her back and she has a history of back pain "all over" in her thoracolumbar region.  She sees a Restaurant manager, fast food.      She has a cough of 2 years duration which was previously attributing to allergies.  She coughs daily, it is non productive - very rarely some mucus. No blood. Worse at the end of the day. Doesn't seem to be worse at night. Cough is worse with exertion. Occasionally coughs after eating.  She has never had a swallow study but wonders if the MSA could be affecting her swallowing. No itchy/watery eyes, +frequent throat clearing. She has reflux and takes omeprazole 20 mg once/daily.    She does wheeze when she is up moving around. She has an albuterol inhaler for over 10 years off and on. She does get sob when she is up walking around with exertion.  Anxiety is also a trigger for her breathing difficulty.  Using the albuterol 4 times/week. Wakes up about once/month with nocturnal symptoms.    Diagnosed with asthma in her 69s. No childhood respiratory disease.   He had been on symbicort in the past but had "shakes and weakness" to this. He asked her to start taking Breo but she hasn't  started taking it yet.   She also has a hoarse voice.   Current Regimen: albuterol  Asthma Triggers: anxiety, exertion, seasonal allergies.  Exacerbations in the last year: none History of hospitalization or intubation: never Hives: none Allergy Testing: a long time ago, doesn't remember.  GERD: yes, on PPI Allergic Rhinitis: yes.   Social history: Occupation: Has worked as a Research scientist (physical sciences)  Exposures: originally from [place], lived in [place] and now in [place]. Lives at home with shih-tzu poodle and husband.  Smoking history: never smoker, second hand smoke exposure as a child   Social History   Occupational History  . Occupation: retired/disabled    Employer: [employer]    Comment: receptionist  Tobacco Use  . Smoking status: Never Smoker  . Smokeless tobacco: Never Used  Substance and Sexual Activity  . Alcohol use: No    Alcohol/week: 0.0 standard drinks  . Drug use: No  . Sexual activity: Never    Partners: Male    Birth control/protection: Post-menopausal    Relevant family history: Sister - asthma, severe  Family History  Problem Relation Age of Onset  . Diabetes Mother   . Heart failure Mother        CHF  . Thyroid disease Mother   . Heart failure Father  CHF  . Heart attack Brother   . Breast cancer Sister   . Asthma Sister   . Heart failure Sister     Past Medical History:  Diagnosis Date  . Allergic rhinitis   . Anemia   . Arthritis   . Asthma   . Blood clot in vein  age 66   left leg  . Depression   . GERD (gastroesophageal reflux disease)   . Hyperlipidemia   . Hypertension    pt denies, in physician record  . Hypothyroidism   . Kidney stones   . Multiple pregnancy loss, not currently pregnant    multiple miscarriages-pt.unsure of #  . 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 Nephrolithiasis    hx of kidney stones, last 6 months ago  . Osteopenia   . Osteoporosis   . Parkinsonism ([place])   . Tendinitis    left foot   . Vitamin D deficiency     Past Surgical History:  Procedure Laterality Date  . BREAST EXCISIONAL BIOPSY Right   . BREAST SURGERY     rt. lumpectomy, benign  . colonscopy  2011  . cystoscopy  2012  . JOINT REPLACEMENT  05-2007   right knee replacment/revision  . KIDNEY STONE SURGERY    . REPLACEMENT TOTAL KNEE Left 06/05/12  . TOTAL KNEE REVISION  06/03/2012   Procedure: TOTAL KNEE REVISION;  Surgeon: Gearlean Alf, MD;  Location: [facility];  Service: Orthopedics;  Laterality: Left;  Revision of a Left Uni Knee to a Total Knee Arthroplasty     Review of systems: Review of Systems  Constitutional: Negative for chills and fever.  HENT: Positive for sore throat. Negative for congestion and sinus pain.   Eyes: Negative for discharge and redness.  Respiratory: Positive for cough, shortness of breath and wheezing. Negative for hemoptysis.   Cardiovascular: Negative for chest pain and palpitations.  Gastrointestinal: Positive for heartburn. Negative for nausea and vomiting.  Genitourinary: Negative.   Musculoskeletal: Negative for myalgias.  Skin: Positive for rash.  Neurological: Negative for focal weakness.  Endo/Heme/Allergies: Positive for environmental allergies.  Psychiatric/Behavioral: The patient is nervous/anxious.   All other systems reviewed and are negative.   Physical Exam: Blood pressure 138/84, pulse 75, temperature (!) 97.5 F (36.4 C), temperature source Temporal, height 5\' 6"  (1.676 m), weight 181 lb 9.6 oz (82.4 kg), SpO2 97 %. Gen:      No acute distress Eyes: EOMI, sclera anicteric ENT:  no nasal polyps, mucus membranes moist,  Neck:     Supple, no thyromegaly Lungs:    No increased respiratory effort, symmetric chest wall excursion, clear to auscultation bilaterally, no wheezes or crackles CV:         Regular rate and rhythm; no murmurs, rubs, or gallops.  No pedal edema Abd:      + bowel sounds; soft, non-tender; no distension MSK: no acute synovitis of DIP  or PIP joints, no mechanics hands.  Skin:      Warm and dry; no rashes, no hives Neuro: Hoarse voice, there is a mild resting tremor. Psych: alert and oriented x3, normal mood and affect  Data Reviewed: Imaging: No imaging for review  PFTs: No previous PFTs on file  Labs: No documented eosinophilia Immunization status: Immunization History  Administered Date(s) Administered  . Tdap 04/24/2013  . Zoster 10/27/2012    Assessment:  Ms. 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  Upper airway cough syndrome 2.  Gastroesophageal reflux disease 3.  Probable allergic rhinitis 4.  Mild intermittent asthma  Plan/Recommendations: Her chronic cough is most likely multifactorial secondary to reflux as well as chronic rhinitis.  I think it is reasonable to continue her proton pump inhibitor once daily, I explained that we may increase this in the future.  I have also given her some advice for lifestyle modification.  In the meantime we will also start treatment for her rhinitis with montelukast once daily, as well as fluticasone once daily.  We have gone over instructions on how to use this.  We discussed at length that asthma is a clinical diagnosis, but there is no one test that will give [place] this diagnosis.  I do think that her symptoms are consistent.  Advised her to continue the albuterol for now, but if she is using the rescue inhaler more than twice a day she needs to start taking the Breo that Dr. Brigitte Pulse has prescribed.  She would like to take the pulmonary function testing first, and follow-up with [place] in clinic.  She will call me if the breathing gets worse before then.  Declined flu shot.    Return to Care: Return in about 3 months (around 09/21/2019) for cough, asthma.  Lenice Llamas, MD Pulmonary and [place]  CC: Marton Redwood, MD

## 2019-06-22 DIAGNOSIS — H5010 Unspecified exotropia: Secondary | ICD-10-CM | POA: Diagnosis not present

## 2019-06-22 NOTE — Telephone Encounter (Signed)
Pt aware of pft and covid test appt

## 2019-06-23 DIAGNOSIS — M5134 Other intervertebral disc degeneration, thoracic region: Secondary | ICD-10-CM | POA: Diagnosis not present

## 2019-06-23 DIAGNOSIS — M5136 Other intervertebral disc degeneration, lumbar region: Secondary | ICD-10-CM | POA: Diagnosis not present

## 2019-06-23 DIAGNOSIS — M9902 Segmental and somatic dysfunction of thoracic region: Secondary | ICD-10-CM | POA: Diagnosis not present

## 2019-06-23 DIAGNOSIS — M9903 Segmental and somatic dysfunction of lumbar region: Secondary | ICD-10-CM | POA: Diagnosis not present

## 2019-06-30 DIAGNOSIS — M5136 Other intervertebral disc degeneration, lumbar region: Secondary | ICD-10-CM | POA: Diagnosis not present

## 2019-06-30 DIAGNOSIS — M9902 Segmental and somatic dysfunction of thoracic region: Secondary | ICD-10-CM | POA: Diagnosis not present

## 2019-06-30 DIAGNOSIS — M5134 Other intervertebral disc degeneration, thoracic region: Secondary | ICD-10-CM | POA: Diagnosis not present

## 2019-06-30 DIAGNOSIS — M9903 Segmental and somatic dysfunction of lumbar region: Secondary | ICD-10-CM | POA: Diagnosis not present

## 2019-07-07 DIAGNOSIS — M5136 Other intervertebral disc degeneration, lumbar region: Secondary | ICD-10-CM | POA: Diagnosis not present

## 2019-07-07 DIAGNOSIS — M9902 Segmental and somatic dysfunction of thoracic region: Secondary | ICD-10-CM | POA: Diagnosis not present

## 2019-07-07 DIAGNOSIS — M5134 Other intervertebral disc degeneration, thoracic region: Secondary | ICD-10-CM | POA: Diagnosis not present

## 2019-07-07 DIAGNOSIS — M9903 Segmental and somatic dysfunction of lumbar region: Secondary | ICD-10-CM | POA: Diagnosis not present

## 2019-07-13 NOTE — Telephone Encounter (Signed)
Patient scheduled for pft on 08/18/2018 -pr

## 2019-07-14 DIAGNOSIS — M5136 Other intervertebral disc degeneration, lumbar region: Secondary | ICD-10-CM | POA: Diagnosis not present

## 2019-07-14 DIAGNOSIS — M9902 Segmental and somatic dysfunction of thoracic region: Secondary | ICD-10-CM | POA: Diagnosis not present

## 2019-07-14 DIAGNOSIS — M9903 Segmental and somatic dysfunction of lumbar region: Secondary | ICD-10-CM | POA: Diagnosis not present

## 2019-07-14 DIAGNOSIS — M5134 Other intervertebral disc degeneration, thoracic region: Secondary | ICD-10-CM | POA: Diagnosis not present

## 2019-07-28 DIAGNOSIS — M5134 Other intervertebral disc degeneration, thoracic region: Secondary | ICD-10-CM | POA: Diagnosis not present

## 2019-07-28 DIAGNOSIS — M5136 Other intervertebral disc degeneration, lumbar region: Secondary | ICD-10-CM | POA: Diagnosis not present

## 2019-07-28 DIAGNOSIS — M9903 Segmental and somatic dysfunction of lumbar region: Secondary | ICD-10-CM | POA: Diagnosis not present

## 2019-07-28 DIAGNOSIS — M9902 Segmental and somatic dysfunction of thoracic region: Secondary | ICD-10-CM | POA: Diagnosis not present

## 2019-08-16 ENCOUNTER — Telehealth: Payer: Self-pay | Admitting: Internal Medicine

## 2019-08-16 ENCOUNTER — Ambulatory Visit: Payer: Medicare Other | Attending: Internal Medicine

## 2019-08-16 ENCOUNTER — Other Ambulatory Visit (HOSPITAL_COMMUNITY): Payer: Medicare Other

## 2019-08-16 DIAGNOSIS — Z20822 Contact with and (suspected) exposure to covid-19: Secondary | ICD-10-CM

## 2019-08-16 NOTE — Telephone Encounter (Signed)
ATC patient and no answer, no voicemail. Patient is scheduled today(08/16/19) for her Covid test for her PFT at 3pm at [facility].

## 2019-08-16 NOTE — Telephone Encounter (Signed)
Pt returned call. Informed her of the COVID test and location. This has already been scheduled for today 08/16/19 at 1500. Pt verbalized understanding and denied any further questions or concerns at this time.

## 2019-08-17 LAB — NOVEL CORONAVIRUS, NAA: SARS-CoV-2, NAA: NOT DETECTED

## 2019-08-18 ENCOUNTER — Other Ambulatory Visit: Payer: Medicare Other

## 2019-08-20 ENCOUNTER — Other Ambulatory Visit: Payer: Self-pay

## 2019-08-20 ENCOUNTER — Ambulatory Visit (INDEPENDENT_AMBULATORY_CARE_PROVIDER_SITE_OTHER): Payer: Medicare Other | Admitting: Internal Medicine

## 2019-08-20 DIAGNOSIS — J4520 Mild intermittent asthma, uncomplicated: Secondary | ICD-10-CM

## 2019-08-20 LAB — PULMONARY FUNCTION TEST
DL/VA % pred: 132 %
DL/VA: 5.43 ml/min/mmHg/L
DLCO unc % pred: 101 %
DLCO unc: 21.15 ml/min/mmHg
FEF 25-75 Post: 0.8 L/sec
FEF 25-75 Pre: 0.82 L/sec
FEF2575-%Change-Post: -2 %
FEF2575-%Pred-Post: 39 %
FEF2575-%Pred-Pre: 39 %
FEV1-%Change-Post: 0 %
FEV1-%Pred-Post: 63 %
FEV1-%Pred-Pre: 62 %
FEV1-Post: 1.57 L
FEV1-Pre: 1.56 L
FEV1FVC-%Change-Post: 1 %
FEV1FVC-%Pred-Pre: 87 %
FEV6-%Change-Post: -2 %
FEV6-%Pred-Post: 72 %
FEV6-%Pred-Pre: 74 %
FEV6-Post: 2.27 L
FEV6-Pre: 2.33 L
FEV6FVC-%Change-Post: 0 %
FEV6FVC-%Pred-Post: 103 %
FEV6FVC-%Pred-Pre: 103 %
FVC-%Change-Post: -1 %
FVC-%Pred-Post: 70 %
FVC-%Pred-Pre: 71 %
FVC-Post: 2.32 L
FVC-Pre: 2.35 L
Post FEV1/FVC ratio: 68 %
Post FEV6/FVC ratio: 99 %
Pre FEV1/FVC ratio: 67 %
Pre FEV6/FVC Ratio: 99 %
RV % pred: 141 %
RV: 3.2 L
TLC % pred: 109 %
TLC: 5.87 L

## 2019-08-20 LAB — POCT EXHALED NITRIC OXIDE: FeNO level (ppb): 10

## 2019-08-20 NOTE — Progress Notes (Signed)
Full PFT performed today as well as Feno.

## 2019-08-25 ENCOUNTER — Other Ambulatory Visit: Payer: Self-pay | Admitting: Neurology

## 2019-09-07 NOTE — Progress Notes (Signed)
Virtual Visit via Video Note The purpose of this virtual visit is to provide medical care while limiting exposure to the novel coronavirus.    Consent was obtained for video visit:  Yes.   Answered questions that patient had about telehealth interaction:  Yes.   I discussed the limitations, risks, security and privacy concerns of performing an evaluation and management service by telemedicine. I also discussed with the patient that there may be a patient responsible charge related to this service. The patient expressed understanding and agreed to proceed.  Pt location: Home Physician Location: office Name of referring provider:  Marton Redwood, MD I connected with [name] at patients initiation/request on 09/09/2019 at  3:00 PM EST by video enabled telemedicine application and verified that I am speaking with the correct person using two identifiers. Pt MRN:  [id number] Pt DOB:  01-06-1951 Video Participants:  [name];     History of Present Illness:  Patient seen today in follow-up for MSA.  On levodopa.  Has had 2 falls since last visit.  With one, she landed on the bed and other she was able to grab a box which cushioned her fall.  not using cane/walker in the home.  Uses cane in the store.   In regards to weight loss, she reports that she is still losing weight.  She thinks that she eats but just quits eating when full. Getting first covid vaccine on Friday.  C/o memory issues.  Trouble with word finding trouble.    Medical records have been reviewed since last visit.  Saw pulmonary in November in regards to cough and was felt that this was due to chronic rhinitis and reflux.  C/o trouble with swallowing.  Using CBD for back pain and it is helping.  She thinks that it has helped cognition.  Current movement d/o meds:  Amantadine, 100 mg 3 times per day (increased) Carbidopa/levodopa 25/100 CR, 1 tablet 6 times per day   Current Outpatient Medications on File Prior to Visit   Medication Sig Dispense Refill  . amantadine (SYMMETREL) 100 MG capsule TAKE 1 CAPSULE BY MOUTH THREE TIMES DAILY 180 capsule 0  . Carbidopa-Levodopa ER (SINEMET CR) 25-100 MG tablet controlled release Take 1 tablet by mouth 6 (six) times daily. 540 tablet 1  . citalopram (CELEXA) 10 MG tablet Take 1 tablet (10 mg total) by mouth daily. 90 tablet 1  . fluticasone (FLONASE) 50 MCG/ACT nasal spray Place 1 spray into both nostrils daily. 16 g 11  . furosemide (LASIX) 20 MG tablet Take 20 mg by mouth daily.    Marland Kitchen levothyroxine (SYNTHROID, LEVOTHROID) 88 MCG tablet Take 88 mcg by mouth at bedtime.    Marland Kitchen linaclotide (LINZESS) 72 MCG capsule Take 72 mcg by mouth daily before breakfast.     . montelukast (SINGULAIR) 10 MG tablet Take 1 tablet (10 mg total) by mouth at bedtime. 30 tablet 11  . Multiple Vitamin (MULTIVITAMIN WITH MINERALS) TABS tablet Take 2 tablets by mouth daily.    Marland Kitchen omeprazole (PRILOSEC OTC) 20 MG tablet Take 20 mg by mouth daily before breakfast.    . OVER THE COUNTER MEDICATION CBD oil - 8.3 mg daily    . pantoprazole (PROTONIX) 40 MG tablet Take 40 mg by mouth daily.    Marland Kitchen VITAMIN D PO Take 2 drops by mouth daily.    . vitamin E 100 UNIT capsule Take by mouth daily. 2 tabs daily    . VOLTAREN 1 % GEL  No current facility-administered medications on file prior to visit.     Observations/Objective:   Vitals:   09/09/19 1504  BP: 117/66  Pulse: 78  Weight: 174 lb (78.9 kg)   GEN:  The patient appears stated age and is in NAD.  Neurological examination:  Orientation: The patient is alert and oriented x3. Cranial nerves: There is good facial symmetry. There is no facial hypomimia.  The speech is fluent and clear. Soft palate rises symmetrically and there is no tongue deviation. Hearing is intact to conversational tone. Motor: Strength is at least antigravity x 4.   Shoulder shrug is equal and symmetric.  There is no pronator drift.  Movement examination: Tone: unable  Abnormal movements: none seen Coordination:  There is mild decremation with RAM's, with hand opening and closing bilaterally   Assessment and Plan:   1.  MSA  -Continue carbidopa/levodopa 25/100 CR, 1 tablet 6 times per day (nausea/vomiting with IR)  -Continue amantadine, 100 mg 3 times per day.  This has helped the dyskinesia  -discussed PT.  She wants to think about it and is moving soon.  Wants to hold on the referral until she moves. 2.  GAD and memory change  -Last neurocognitive testing done in January, 2016 demonstrating mixed anxiety/depression.  -pt noting memory loss is worse so will repeat the neurocognitive testing  -encouraged her attend the support groups for MSA  -On citalopram 3.  Diplopia  -Saw Dr. 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  4.  Dysphagia  -refer for MBE   Follow Up Instructions:  6 months (pt prefers online appt)   -I discussed the assessment and treatment plan with the patient. The patient was provided an opportunity to ask questions and all were answered. The patient agreed with the plan and demonstrated an understanding of the instructions.   The patient was advised to call back or seek an in-person evaluation if the symptoms worsen or if the condition fails to improve as anticipated.    Total time spent on today's visit was 30 minutes, including both face-to-face time and nonface-to-face time.  Time included that spent on review of records (prior notes available to me/labs/imaging if pertinent), discussing treatment and goals, answering patient's questions and coordinating care.   Alonza Bogus, DO

## 2019-09-08 ENCOUNTER — Encounter: Payer: Self-pay | Admitting: Neurology

## 2019-09-08 ENCOUNTER — Telehealth: Payer: Self-pay | Admitting: Neurology

## 2019-09-08 NOTE — Telephone Encounter (Signed)
Patient left message on voicemail regarding a group that she would like to join this afternoon with you? Please Call. Thank you

## 2019-09-09 ENCOUNTER — Other Ambulatory Visit: Payer: Self-pay

## 2019-09-09 ENCOUNTER — Encounter: Payer: Self-pay | Admitting: Neurology

## 2019-09-09 ENCOUNTER — Telehealth (INDEPENDENT_AMBULATORY_CARE_PROVIDER_SITE_OTHER): Payer: Medicare Other | Admitting: Neurology

## 2019-09-09 VITALS — BP 117/66 | HR 78 | Wt 174.0 lb

## 2019-09-09 DIAGNOSIS — R413 Other amnesia: Secondary | ICD-10-CM | POA: Diagnosis not present

## 2019-09-09 DIAGNOSIS — R1319 Other dysphagia: Secondary | ICD-10-CM

## 2019-09-09 DIAGNOSIS — G232 Striatonigral degeneration: Secondary | ICD-10-CM | POA: Diagnosis not present

## 2019-09-19 ENCOUNTER — Ambulatory Visit: Payer: Medicare Other | Attending: Internal Medicine

## 2019-09-19 DIAGNOSIS — Z23 Encounter for immunization: Secondary | ICD-10-CM | POA: Insufficient documentation

## 2019-09-19 NOTE — Progress Notes (Signed)
   Covid-19 Vaccination Clinic  Name:  Rachel [name]    MRN: [id number] DOB: June 19, 1951  09/19/2019  Rachel [name] was observed post Covid-19 immunization for 15 minutes without incidence. She was provided with Vaccine Information Sheet and instruction to access the V-Safe system.   Rachel [name] was instructed to call 911 with any severe reactions post vaccine: Marland Kitchen Difficulty breathing  . Swelling of your face and throat  . A fast heartbeat  . A bad rash all over your body  . Dizziness and weakness    Immunizations Administered    Name Date Dose VIS Date Route   Pfizer COVID-19 Vaccine 09/19/2019  2:59 PM 0.3 mL 07/09/2019 Intramuscular   Manufacturer: [place]   Lot: [id number]   [place]: [id number]

## 2019-09-21 ENCOUNTER — Other Ambulatory Visit: Payer: Medicare Other

## 2019-09-22 ENCOUNTER — Encounter: Payer: Self-pay | Admitting: Internal Medicine

## 2019-09-22 ENCOUNTER — Ambulatory Visit: Payer: Medicare Other | Admitting: Internal Medicine

## 2019-09-22 ENCOUNTER — Other Ambulatory Visit: Payer: Self-pay

## 2019-09-22 VITALS — BP 120/70 | HR 72 | Temp 98.2°F | Ht 66.0 in | Wt 178.8 lb

## 2019-09-22 DIAGNOSIS — J4540 Moderate persistent asthma, uncomplicated: Secondary | ICD-10-CM

## 2019-09-22 MED ORDER — MOMETASONE FURO-FORMOTEROL FUM 100-5 MCG/ACT IN AERO: 2.0000 | INHALATION_SPRAY | Freq: Two times a day (BID) | RESPIRATORY_TRACT | Status: DC

## 2019-09-22 NOTE — Progress Notes (Signed)
Rachel [name]    [id number]    05-24-1951  Primary Care Physician:Shaw, Gwyndolyn Saxon, MD Date of Appointment: 09/22/2019 Established Patient Visit  Chief complaint:   Chief Complaint  Patient presents with  . Follow-up    3 month f/u     HPI: Rachel [name] is a 69 y.o. woman who presents for follow up for: Chronic cough and dyspnea. Cough is with eating and she was started on PPI. Has wheezing and dyspnea, uses albuterol prn. Diagnosed with asthma in her 14s. No childhood respiratory disease.   He had been on symbicort in the past but had "shakes and weakness" to this. Worried about cataracts. She also has a hoarse voice.   Interval Updates:  GERD improved on PPI. Albuterol is helping but she is taking more often and also at night time.   Current Regimen: albuterol prn - taking 4-5 times/week. Montelukast.  Asthma Triggers: anxiety, exertion, seasonal allergies.  Exacerbations in the last year: none History of hospitalization or intubation: never Hives: none Allergy Testing: a long time ago, doesn't remember.  GERD: yes, on PPI Allergic Rhinitis: yes on montelukast.  Asthma Control Test:  Asthma Control Test ACT Total Score  09/22/2019 18   I have reviewed the patient's family social and past medical history and updated as appropriate.   Past Medical History:  Diagnosis Date  . Allergic rhinitis   . Anemia   . Arthritis   . Asthma   . Blood clot in vein  age 37   left leg  . Depression   . GERD (gastroesophageal reflux disease)   . Hyperlipidemia   . Hypertension    pt denies, in physician record  . Hypothyroidism   . Kidney stones   . Multiple pregnancy loss, not currently pregnant    multiple miscarriages-pt.unsure of #  . 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 Nephrolithiasis    hx of kidney stones, last 6 months ago  . Osteopenia   . Osteoporosis   . Parkinsonism ([place])   . Tendinitis    left foot  . Vitamin D deficiency     Past Surgical  History:  Procedure Laterality Date  . BREAST EXCISIONAL BIOPSY Right   . BREAST SURGERY     rt. lumpectomy, benign  . colonscopy  2011  . cystoscopy  2012  . JOINT REPLACEMENT  05-2007   right knee replacment/revision  . KIDNEY STONE SURGERY    . REPLACEMENT TOTAL KNEE Left 06/05/12  . TOTAL KNEE REVISION  06/03/2012   Procedure: TOTAL KNEE REVISION;  Surgeon: Gearlean Alf, MD;  Location: [facility];  Service: Orthopedics;  Laterality: Left;  Revision of a Left Uni Knee to a Total Knee Arthroplasty    Family History  Problem Relation Age of Onset  . Diabetes Mother   . Heart failure Mother        CHF  . Thyroid disease Mother   . Heart failure Father        CHF  . Heart attack Brother   . Breast cancer Sister   . Asthma Sister   . Heart failure Sister     Social History   Occupational History  . Occupation: retired/disabled    Employer: [employer]    Comment: receptionist  Tobacco Use  . Smoking status: Never Smoker  . Smokeless tobacco: Never Used  Substance and Sexual Activity  . Alcohol use: No    Alcohol/week: 0.0 standard drinks  .  Drug use: No  . Sexual activity: Never    Partners: Male    Birth control/protection: Post-menopausal    Review of systems: Constitutional: No fevers, chills, night sweats, or weight loss. CV: No chest pain, or palpitations. Resp: No hemoptysis.  Physical Exam: Blood pressure 120/70, pulse 72, temperature 98.2 F (36.8 C), temperature source Temporal, height 5\' 6"  (1.676 m), weight 178 lb 12.8 oz (81.1 kg), SpO2 99 %.  Gen:      No acute distress Eyes: EOMI, sclera anicteric ENT:  no nasal polyps, mucus membranes moist Neck:     Supple, no thyromegaly Lungs:    No increased respiratory effort, symmetric chest wall excursion, clear to auscultation bilaterally, no wheezes or crackles CV:         Regular rate and rhythm; no murmurs, rubs, or gallops.  No pedal edema Neuro: resting tremor  Data Reviewed: Imaging: I have  personally reviewed the lung windows from her CT A/P in September 2020. Limited lung base evaluation shows mild peribronchial thickening. No pleural effusion or pneumonia.    PFTs:  PFT Results Latest Ref Rng & Units 08/20/2019  FVC-Pre L 2.35  FVC-Predicted Pre % 71  FVC-Post L 2.32  FVC-Predicted Post % 70  Pre FEV1/FVC % % 67  Post FEV1/FCV % % 68  FEV1-Pre L 1.56  FEV1-Predicted Pre % 62  FEV1-Post L 1.57  DLCO UNC% % 101  DLCO COR %Predicted % 132  TLC L 5.87  TLC % Predicted % 109  RV % Predicted % 141   I have personally reviewed the patient's PFTs and they are notable for moderate airflow limitation without a bronchodilator response.  FeNO 10 ppb - not elevated.   Labs: Lab Results  Component Value Date   WBC 7.6 06/15/2012   HGB 9.5 (L) 06/15/2012   HCT 30.1 (L) 06/15/2012   MCV 89.9 06/15/2012   PLT 483 (H) 06/15/2012    Immunization status: Immunization History  Administered Date(s) Administered  . PFIZER SARS-COV-2 Vaccination 09/19/2019  . Tdap 04/24/2013  . Zoster 10/27/2012    Assessment:  Ms. Tolly is a 69 year old woman with a history of multiple system atrophy with a resting tremor who presents with:  1. Moderate intermittent asthma - not well controlled 2.  Gastroesophageal reflux disease - controlled on PPI 3.  Allergic Rhinitis - well controlled on singulair and flonase  Plan/Recommendations: Would start her Breo for asthma. If this helps she can continue. Otherwise I'd like to switch her to advair or dulera BID. She and her husband won't be able to afford the new inhaler for a few weeks which is why I said fine to start breo now.  Continue PPI for GERD Continue flonase and singulair for rhinitis  I spent 35 minutes on 09/22/2019 in care of this patient including face to face time and non-face to face time spent charting, review of outside records, and coordination of care.   Return to Care: Return in about 3 months (around 12/20/2019)  for asthma.  Lenice Llamas, MD Pulmonary and [place]

## 2019-09-22 NOTE — Patient Instructions (Addendum)
The patient should have follow up scheduled with myself in 3 months - telephone/virtual visit.    Start taking Breo once/day. Let me know if any vision problems.   Start dulera after that. 2 puffs morning, 2 puffs at night. Rinse your mouth out afterwards.  Let me know if medication alternative is needed based on price.  ------------------------------------------------------------------------------------------------------------------- Metered Dose Inhaler (MDI) Instructions (Albuterol, Dulera)  Before using your inhaler for the first time: 1. Take the cap off the mouthpiece. 2. Shake the inhaler for 5 seconds 3. Press down on the canister to spray the medicine into the air. 4. Repeat these steps 3 more times. If you haven't used your inhaler in more than 2 weeks, repeat these steps before using it.  To use your inhaler: 1. Take the cap off the mouthpiece 2. Shake the inhaler for 5 seconds. 3. Hold it upright with your finger on the top of the canister and your thumb on the bottom of the inhaler. 4. Breathe out. 5. Close your lips around the mouthpiece. 6. As you start to inhale the next breath, press down on the canister. 7. Inhale deeply and slowly through your mouth. 8. Hold your breath for 5 to 10 seconds to keep the medicine in your lungs. 9. Let your breath out.   10. Repeat these steps if you are supposed to take 2 puffs.  11. Put the cap back on the mouthpiece 12. Remember to rinse, gargle and spit with water after use if your inhaler has a steroid in it (Advair, Symbicort, Dulera, Qvar, Flovent)  Caring for your MDI and chamber For most MDIs, remove the canister and rinse the plastic holder with warm running water once a week to prevent the holes from getting clogged. Shake well and let air dry. There are some medications in which the inhaler cannot be removed from the holder. These usually need to be cleaned by wiping the mouthpiece with a cloth or cleaning with a dry cotton  swab. Refer to the patient instructions that come with your inhaler. Clean the chamber about once a week. Remove the soft ring at the end of the chamber. Soak the spacer in warm water with a mild detergent. Carefully clean and, rinse, and shake off excess water. Do not hand dry. Allow to completely air dry. Do not store the chamber in a plastic bag.  Checking your MDI It is important that you know how much medication is left in your inhaler. The number of puffs contained in your MDI is printed on the side of the canister. After you have used that number of puffs, you must discard your inhaler even if it continues to spray. Keep track of how many puffs you have used. You also must include priming puffs in this total. If you use an MDI every day for control of symptoms, you can determine how long it will last by dividing the total number of puffs in the MDI by the total puffs you use every day. For example: 2 puffs x 2 times per day = 4 total puffs per day. At 120 puffs, the MDI will last 30 days. If you use an inhaler only when you need to, you must keep track of how many times you spray the inhaler. Some of the newer MDIs have counting devices built in.  If your MDI does not have a dose counter, you can obtain a device that attaches to the MDI and counts down the number of puffs each time you  press the inhaler. Ask your health care professional for more information about these devices, as well as how to best keep track of your medicine without an add-on device (if you prefer).

## 2019-10-11 ENCOUNTER — Other Ambulatory Visit: Payer: Self-pay | Admitting: Neurology

## 2019-10-11 NOTE — Telephone Encounter (Signed)
Rx(s) sent to pharmacy electronically.  

## 2019-10-12 ENCOUNTER — Other Ambulatory Visit (HOSPITAL_COMMUNITY): Payer: Self-pay

## 2019-10-12 DIAGNOSIS — R1310 Dysphagia, unspecified: Secondary | ICD-10-CM

## 2019-10-13 ENCOUNTER — Ambulatory Visit: Payer: Medicare Other | Attending: Internal Medicine

## 2019-10-13 DIAGNOSIS — Z23 Encounter for immunization: Secondary | ICD-10-CM

## 2019-10-13 NOTE — Progress Notes (Signed)
   Covid-19 Vaccination Clinic  Name:  Rachel [name]    MRN: [id number] DOB: June 06, 1951  10/13/2019  Rachel [name] was observed post Covid-19 immunization for 15 minutes without incident. She was provided with Vaccine Information Sheet and instruction to access the V-Safe system.   Rachel [name] was instructed to call 911 with any severe reactions post vaccine: Marland Kitchen Difficulty breathing  . Swelling of face and throat  . A fast heartbeat  . A bad rash all over body  . Dizziness and weakness   Immunizations Administered    Name Date Dose VIS Date Route   Pfizer COVID-19 Vaccine 10/13/2019  3:06 PM 0.3 mL 07/09/2019 Intramuscular   Manufacturer: [place]   Lot: [id number]   [place]: [id number]

## 2019-10-18 ENCOUNTER — Encounter: Payer: Medicare Other | Admitting: Counselor

## 2019-10-25 ENCOUNTER — Encounter: Payer: Medicare Other | Admitting: Counselor

## 2019-11-01 ENCOUNTER — Encounter: Payer: Medicare Other | Admitting: Counselor

## 2019-11-03 ENCOUNTER — Ambulatory Visit (HOSPITAL_COMMUNITY): Payer: Medicare Other

## 2019-11-03 ENCOUNTER — Encounter (HOSPITAL_COMMUNITY): Payer: Medicare Other

## 2019-11-05 ENCOUNTER — Ambulatory Visit (HOSPITAL_COMMUNITY): Payer: Medicare Other

## 2019-11-05 ENCOUNTER — Ambulatory Visit (HOSPITAL_COMMUNITY)
Admission: RE | Admit: 2019-11-05 | Discharge: 2019-11-05 | Disposition: A | Discharge status: Home / Self Care | Payer: Medicare Other | Source: Ambulatory Visit | Attending: Neurology | Admitting: Neurology

## 2019-11-05 ENCOUNTER — Other Ambulatory Visit: Payer: Self-pay

## 2019-11-05 DIAGNOSIS — R1319 Other dysphagia: Secondary | ICD-10-CM

## 2019-11-08 DIAGNOSIS — M9901 Segmental and somatic dysfunction of cervical region: Secondary | ICD-10-CM | POA: Diagnosis not present

## 2019-11-08 DIAGNOSIS — M5136 Other intervertebral disc degeneration, lumbar region: Secondary | ICD-10-CM | POA: Diagnosis not present

## 2019-11-08 DIAGNOSIS — M9904 Segmental and somatic dysfunction of sacral region: Secondary | ICD-10-CM | POA: Diagnosis not present

## 2019-11-08 DIAGNOSIS — M9903 Segmental and somatic dysfunction of lumbar region: Secondary | ICD-10-CM | POA: Diagnosis not present

## 2019-11-10 ENCOUNTER — Encounter: Payer: Medicare Other | Admitting: Counselor

## 2019-11-10 NOTE — Progress Notes (Deleted)
No show

## 2019-11-11 ENCOUNTER — Other Ambulatory Visit: Payer: Self-pay

## 2019-11-11 DIAGNOSIS — R1319 Other dysphagia: Secondary | ICD-10-CM

## 2019-11-11 NOTE — Progress Notes (Signed)
Putting in new orders for Swallow test, because patient missed her appt last week and new order being placed to get appt rescheduled. Scheduler made aware and will contact patient to reschedule the test.

## 2019-11-14 ENCOUNTER — Emergency Department (HOSPITAL_COMMUNITY): Payer: Medicare Other

## 2019-11-14 ENCOUNTER — Encounter (HOSPITAL_COMMUNITY): Payer: Self-pay

## 2019-11-14 ENCOUNTER — Emergency Department (HOSPITAL_COMMUNITY)
Admission: EM | Admit: 2019-11-14 | Discharge: 2019-11-14 | Disposition: A | Discharge status: Home / Self Care | Payer: Medicare Other | Attending: Emergency Medicine | Admitting: Emergency Medicine

## 2019-11-14 ENCOUNTER — Other Ambulatory Visit: Payer: Self-pay

## 2019-11-14 DIAGNOSIS — I10 Essential (primary) hypertension: Secondary | ICD-10-CM | POA: Diagnosis not present

## 2019-11-14 DIAGNOSIS — N179 Acute kidney failure, unspecified: Secondary | ICD-10-CM | POA: Insufficient documentation

## 2019-11-14 DIAGNOSIS — R1031 Right lower quadrant pain: Secondary | ICD-10-CM | POA: Diagnosis present

## 2019-11-14 DIAGNOSIS — R0902 Hypoxemia: Secondary | ICD-10-CM | POA: Diagnosis not present

## 2019-11-14 DIAGNOSIS — R52 Pain, unspecified: Secondary | ICD-10-CM | POA: Diagnosis not present

## 2019-11-14 DIAGNOSIS — R109 Unspecified abdominal pain: Secondary | ICD-10-CM | POA: Diagnosis not present

## 2019-11-14 DIAGNOSIS — N200 Calculus of kidney: Secondary | ICD-10-CM

## 2019-11-14 DIAGNOSIS — E039 Hypothyroidism, unspecified: Secondary | ICD-10-CM | POA: Diagnosis not present

## 2019-11-14 DIAGNOSIS — J45909 Unspecified asthma, uncomplicated: Secondary | ICD-10-CM | POA: Diagnosis not present

## 2019-11-14 LAB — CBC
HCT: 35.7 % — ABNORMAL LOW (ref 36.0–46.0)
Hemoglobin: 11.6 g/dL — ABNORMAL LOW (ref 12.0–15.0)
MCH: 30.4 pg (ref 26.0–34.0)
MCHC: 32.5 g/dL (ref 30.0–36.0)
MCV: 93.5 fL (ref 80.0–100.0)
Platelets: 230 10*3/uL (ref 150–400)
RBC: 3.82 MIL/uL — ABNORMAL LOW (ref 3.87–5.11)
RDW: 13.2 % (ref 11.5–15.5)
WBC: 7.5 10*3/uL (ref 4.0–10.5)
nRBC: 0 % (ref 0.0–0.2)

## 2019-11-14 LAB — COMPREHENSIVE METABOLIC PANEL
ALT: 14 U/L (ref 0–44)
AST: 21 U/L (ref 15–41)
Albumin: 3.7 g/dL (ref 3.5–5.0)
Alkaline Phosphatase: 82 U/L (ref 38–126)
Anion gap: 10 (ref 5–15)
BUN: 27 mg/dL — ABNORMAL HIGH (ref 8–23)
CO2: 25 mmol/L (ref 22–32)
Calcium: 8.7 mg/dL — ABNORMAL LOW (ref 8.9–10.3)
Chloride: 105 mmol/L (ref 98–111)
Creatinine, Ser: 1.67 mg/dL — ABNORMAL HIGH (ref 0.44–1.00)
GFR calc Af Amer: 36 mL/min — ABNORMAL LOW (ref >60)
GFR calc non Af Amer: 31 mL/min — ABNORMAL LOW (ref >60)
Glucose, Bld: 90 mg/dL (ref 70–99)
Potassium: 4.4 mmol/L (ref 3.5–5.1)
Sodium: 140 mmol/L (ref 135–145)
Total Bilirubin: 0.9 mg/dL (ref 0.3–1.2)
Total Protein: 6.5 g/dL (ref 6.5–8.1)

## 2019-11-14 LAB — URINALYSIS, ROUTINE W REFLEX MICROSCOPIC
Bacteria, UA: NONE SEEN
Bilirubin Urine: NEGATIVE
Glucose, UA: NEGATIVE mg/dL
Ketones, ur: NEGATIVE mg/dL
Nitrite: NEGATIVE
Protein, ur: NEGATIVE mg/dL
RBC / HPF: >50 RBC/hpf — ABNORMAL HIGH (ref 0–5)
Specific Gravity, Urine: 1.011 (ref 1.005–1.030)
pH: 6 (ref 5.0–8.0)

## 2019-11-14 MED ORDER — TAMSULOSIN HCL 0.4 MG PO CAPS: 0.4000 mg | ORAL_CAPSULE | Freq: Every day | ORAL | 0 refills | Status: DC

## 2019-11-14 MED ORDER — ONDANSETRON HCL 4 MG/2ML IJ SOLN
4.0000 mg | Freq: Once | INTRAMUSCULAR | Status: AC
  Administered 2019-11-14: 4 mg via INTRAVENOUS
  Filled 2019-11-14: qty 2

## 2019-11-14 MED ORDER — ONDANSETRON 4 MG PO TBDP: 4.0000 mg | ORAL_TABLET | Freq: Three times a day (TID) | ORAL | 0 refills | Status: DC | PRN

## 2019-11-14 MED ORDER — HYDROMORPHONE HCL 1 MG/ML IJ SOLN
1.0000 mg | Freq: Once | INTRAMUSCULAR | Status: AC
  Administered 2019-11-14: 1 mg via INTRAVENOUS
  Filled 2019-11-14: qty 1

## 2019-11-14 MED ORDER — OXYCODONE HCL 5 MG PO TABS: 5.0000 mg | ORAL_TABLET | ORAL | 0 refills | Status: DC | PRN

## 2019-11-14 MED ORDER — SODIUM CHLORIDE 0.9 % IV BOLUS
1000.0000 mL | Freq: Once | INTRAVENOUS | Status: AC
  Administered 2019-11-14: 1000 mL via INTRAVENOUS

## 2019-11-14 NOTE — ED Provider Notes (Signed)
[facility] Emergency Department Provider Note MRN:  [id number]  Arrival date & time: 11/14/19     Chief Complaint   Flank Pain   History of Present Illness   Rachel [name] is a 69 y.o. year-old female with a history of kidney stones, Parkinson's presenting to the ED with chief complaint of flank pain.  Location: Right flank Duration: 5 hours Onset: Sudden Timing: Constant Description: Sharp Severity: Severe Exacerbating/Alleviating Factors: None Associated Symptoms: Nausea, nonbloody nonbilious emesis Pertinent Negatives: Denies fever, no headache, no vision change, no chest pain, no shortness of breath, no abdominal pain, no dysuria   Review of Systems  A complete 10 system review of systems was obtained and all systems are negative except as noted in the HPI and PMH.   Patient's Health History    Past Medical History:  Diagnosis Date  . Allergic rhinitis   . Anemia   . Arthritis   . Asthma   . Blood clot in vein  age 54   left leg  . Depression   . GERD (gastroesophageal reflux disease)   . Hyperlipidemia   . Hypertension    pt denies, in physician record  . Hypothyroidism   . Kidney stones   . Multiple pregnancy loss, not currently pregnant    multiple miscarriages-pt.unsure of #  . 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 Nephrolithiasis    hx of kidney stones, last 6 months ago  . Osteopenia   . Osteoporosis   . Parkinsonism ([place])   . Tendinitis    left foot  . Vitamin D deficiency     Past Surgical History:  Procedure Laterality Date  . BREAST EXCISIONAL BIOPSY Right   . BREAST SURGERY     rt. lumpectomy, benign  . colonscopy  2011  . cystoscopy  2012  . JOINT REPLACEMENT  05-2007   right knee replacment/revision  . KIDNEY STONE SURGERY    . REPLACEMENT TOTAL KNEE Left 06/05/12  . TOTAL KNEE REVISION  06/03/2012   Procedure: TOTAL KNEE REVISION;  Surgeon: Gearlean Alf, MD;  Location: [facility];  Service: Orthopedics;   Laterality: Left;  Revision of a Left Uni Knee to a Total Knee Arthroplasty    Family History  Problem Relation Age of Onset  . Diabetes Mother   . Heart failure Mother        CHF  . Thyroid disease Mother   . Heart failure Father        CHF  . Heart attack Brother   . Breast cancer Sister   . Asthma Sister   . Heart failure Sister     Social History   Socioeconomic History  . Marital status: Married    Spouse name: Not on file  . Number of children: 1  . Years of education: Not on file  . Highest education level: Some college, no degree  Occupational History  . Occupation: retired/disabled    Employer: [employer]    Comment: receptionist  Tobacco Use  . Smoking status: Never Smoker  . Smokeless tobacco: Never Used  Substance and Sexual Activity  . Alcohol use: No    Alcohol/week: 0.0 standard drinks  . Drug use: No  . Sexual activity: Never    Partners: Male    Birth control/protection: Post-menopausal  Other Topics Concern  . Not on file  Social History Narrative   Pt lives with spouse [name] at home, 1 adopted son   Social Determinants of Health  Financial Resource Strain:   . Difficulty of Paying Living Expenses:   Food Insecurity:   . Worried About Charity fundraiser in the Last Year:   . Arboriculturist in the Last Year:   Transportation Needs:   . Film/video editor (Medical):   Marland Kitchen Lack of Transportation (Non-Medical):   Physical Activity:   . Days of Exercise per Week:   . Minutes of Exercise per Session:   Stress:   . Feeling of Stress :   Social Connections:   . Frequency of Communication with Friends and Family:   . Frequency of Social Gatherings with Friends and Family:   . Attends Religious Services:   . Active Member of Clubs or Organizations:   . Attends Archivist Meetings:   Marland Kitchen Marital Status:   Intimate Partner Violence:   . Fear of Current or Ex-Partner:   . Emotionally Abused:   Marland Kitchen Physically Abused:   .  Sexually Abused:      Physical Exam   Vitals:   11/14/19 1700 11/14/19 1707  BP: (!) 145/80 (!) 145/80  Pulse: 74 77  Resp: 19 15  Temp:    SpO2: 99% 99%    CONSTITUTIONAL: Well-appearing, in mild distress due to pain NEURO:  Alert and oriented x 3, no focal deficits EYES:  eyes equal and reactive ENT/NECK:  no LAD, no JVD CARDIO: Regular rate, well-perfused, normal S1 and S2 PULM:  CTAB no wheezing or rhonchi GI/GU:  normal bowel sounds, non-distended, non-tender; mild right CVA tenderness MSK/SPINE:  No gross deformities, no edema SKIN:  no rash, atraumatic PSYCH:  Appropriate speech and behavior  *Additional and/or pertinent findings included in MDM below  Diagnostic and Interventional Summary    EKG Interpretation  Date/Time:  Sunday November 14 2019 15:18:27 EDT Ventricular Rate:  92 PR Interval:    QRS Duration: 134 QT Interval:  424 QTC Calculation: 525 R Axis:   -52 Text Interpretation: Sinus rhythm Left bundle branch block Confirmed by Gerlene Fee [phone]) on 11/14/2019 4:18:34 PM      Labs Reviewed  CBC - Abnormal; Notable for the following components:      Result Value   RBC 3.82 (*)    Hemoglobin 11.6 (*)    HCT 35.7 (*)    All other components within normal limits  COMPREHENSIVE METABOLIC PANEL - Abnormal; Notable for the following components:   BUN 27 (*)    Creatinine, Ser 1.67 (*)    Calcium 8.7 (*)    GFR calc non Af Amer 31 (*)    GFR calc Af Amer 36 (*)    All other components within normal limits  URINALYSIS, ROUTINE W REFLEX MICROSCOPIC - Abnormal; Notable for the following components:   Hgb urine dipstick MODERATE (*)    Leukocytes,Ua TRACE (*)    RBC / HPF >50 (*)    All other components within normal limits    CT RENAL STONE STUDY  Final Result      Medications  ondansetron (ZOFRAN) injection 4 mg (4 mg Intravenous Given 11/14/19 1513)  sodium chloride 0.9 % bolus 1,000 mL (0 mLs Intravenous Stopped 11/14/19 1622)  HYDROmorphone  (DILAUDID) injection 1 mg (1 mg Intravenous Given 11/14/19 1513)     Procedures  /  Critical Care Procedures  ED Course and Medical Decision Making  I have reviewed the triage vital signs, the nursing notes, and pertinent available records from the EMR.  Listed above are laboratory and imaging  tests that I personally ordered, reviewed, and interpreted and then considered in my medical decision making (see below for details).      Suspect recurrent stone, given advanced age will obtain CT to confirm.  Work-up reveals mild AKI, 5 x 6 mm stone.  Discussed case with Dr. Alyson Ingles of [facility], who explains that if pain is controlled patient can follow-up in the clinic early this week.  Urinalysis is without evidence of infection, pain is well controlled, appropriate for discharge with close urology follow-up.  Barth Kirks. Sedonia Small, MD [place] [email or web address]  Final Clinical Impressions(s) / ED Diagnoses     ICD-10-CM   1. Kidney stone  N20.0   2. AKI (acute kidney injury) ([place])  N17.9     ED Discharge Orders         Ordered    tamsulosin (FLOMAX) 0.4 MG CAPS capsule  Daily     11/14/19 1715    oxyCODONE (ROXICODONE) 5 MG immediate release tablet  Every 4 hours PRN     11/14/19 1715    ondansetron (ZOFRAN ODT) 4 MG disintegrating tablet  Every 8 hours PRN     11/14/19 1715           Discharge Instructions Discussed with and Provided to Patient:     Discharge Instructions     You were evaluated in the Emergency Department and after careful evaluation, we did not find any emergent condition requiring admission or further testing in the hospital.  Your exam/testing today is overall reassuring.  Your symptoms seem to due to a kidney stone.  The kidney stone is large and it is causing some strain and your kidney.  We discussed the case with Dr. Alyson Ingles otherwise urology.  Please call the office first thing tomorrow morning  to schedule a follow-up appointment.  Until then drink plenty of water, and use the medications provided as directed.  Please return to the Emergency Department if you experience any worsening of your condition.  We encourage you to follow up with a primary care provider.  Thank you for allowing [place] to be a part of your care.       Maudie Flakes, MD 11/14/19 2150

## 2019-11-14 NOTE — ED Triage Notes (Signed)
Pt BIB EMS from home. Pt reports R flank pain and nausea that started this morning. Denies V/D. Hx of kidney stones and parkinsons.   BP 160/100 150 mcg Fentanyl NS 350 mL 20G LAC

## 2019-11-14 NOTE — ED Notes (Signed)
Pt transported to CT ?

## 2019-11-14 NOTE — Discharge Instructions (Addendum)
You were evaluated in the Emergency Department and after careful evaluation, we did not find any emergent condition requiring admission or further testing in the hospital.  Your exam/testing today is overall reassuring.  Your symptoms seem to due to a kidney stone.  The kidney stone is large and it is causing some strain and your kidney.  We discussed the case with Dr. Alyson Ingles otherwise urology.  Please call the office first thing tomorrow morning to schedule a follow-up appointment.  Until then drink plenty of water, and use the medications provided as directed.  Please return to the Emergency Department if you experience any worsening of your condition.  We encourage you to follow up with a primary care provider.  Thank you for allowing [place] to be a part of your care.

## 2019-11-17 ENCOUNTER — Encounter: Payer: Medicare Other | Admitting: Counselor

## 2019-11-17 ENCOUNTER — Other Ambulatory Visit: Payer: Self-pay

## 2019-11-18 ENCOUNTER — Ambulatory Visit (HOSPITAL_COMMUNITY)
Admission: RE | Admit: 2019-11-18 | Discharge: 2019-11-18 | Disposition: A | Discharge status: Home / Self Care | Payer: Medicare Other | Source: Ambulatory Visit | Attending: Neurology | Admitting: Neurology

## 2019-11-18 DIAGNOSIS — R05 Cough: Secondary | ICD-10-CM | POA: Diagnosis not present

## 2019-11-18 DIAGNOSIS — R1319 Other dysphagia: Secondary | ICD-10-CM | POA: Diagnosis not present

## 2019-11-18 DIAGNOSIS — R1310 Dysphagia, unspecified: Secondary | ICD-10-CM | POA: Insufficient documentation

## 2019-11-18 NOTE — Progress Notes (Signed)
Modified Barium Swallow Progress Note  Patient Details  Name: [name] MRN: [id number] Date of Birth: 06-Dec-1950  Today's Date: 11/18/2019  Modified Barium Swallow completed.  Full report located under Chart Review in the Imaging Section.  Brief recommendations include the following:  Clinical Impression  Pt was seen for a modified barium swallow study and she presents with overall functional oropharyngeal swallowing abilities.  No laryngeal penetration or aspiration was observed on today's examination.  Of note, pt exhibited throat clearing x1 with a thin liquid trial in the absence of laryngeal penetration or aspiration.  Pt consumed trials of thin liquid, nectar-thick liquid, puree, and regular solids.  Oral phase was remarkable for reduced lingual control resulting in premature spillage to the valleculae with liquid trials and mildly reduced lingual strength resulting in trace oral residue with the puree trial; otherwise it was [facility].  Pharyngeal phase was remarkable for reduced hyolaryngeal excursion resulting in trace pyriform sinus residue across trials.  Swallow initiation was triggered at the level of the valleculae with solid trials and at the level of the pyriform sinus with liquid trials.  Pharyngoesophageal phase was unremarkable.  Recommend regular solids and thin liquids with the following compensatory strategies: 1) Small bites/sips 2) Sit upright as possible 3) Slow rate of intake.  Pt was educated regarding results/recommendations.  No ST follow up is recommended at this time.    Swallow Evaluation Recommendations       SLP Diet Recommendations: Thin liquid;Regular solids   Liquid Administration via: Straw   Medication Administration: Whole meds with liquid   Supervision: Patient able to self feed   Compensations: Slow rate;Small sips/bites   Postural Changes: Remain semi-upright after after feeds/meals (Comment);Seated upright at 90 degrees   Oral Care  Recommendations: Oral care BID       Colin Mulders M.S., CCC-SLP Acute Rehabilitation Services Office: [phone]   [place] 11/18/2019,2:41 PM

## 2019-12-01 DIAGNOSIS — N201 Calculus of ureter: Secondary | ICD-10-CM | POA: Diagnosis not present

## 2019-12-20 ENCOUNTER — Other Ambulatory Visit: Payer: Self-pay

## 2019-12-20 ENCOUNTER — Ambulatory Visit (INDEPENDENT_AMBULATORY_CARE_PROVIDER_SITE_OTHER): Payer: Medicare Other | Admitting: Internal Medicine

## 2019-12-20 ENCOUNTER — Encounter: Payer: Self-pay | Admitting: Internal Medicine

## 2019-12-20 DIAGNOSIS — J301 Allergic rhinitis due to pollen: Secondary | ICD-10-CM

## 2019-12-20 DIAGNOSIS — J4540 Moderate persistent asthma, uncomplicated: Secondary | ICD-10-CM | POA: Diagnosis not present

## 2019-12-20 DIAGNOSIS — K219 Gastro-esophageal reflux disease without esophagitis: Secondary | ICD-10-CM

## 2019-12-20 NOTE — Progress Notes (Signed)
Rachel [name]    [id number]    [birth year]  Primary Care Physician:Shaw, Gwyndolyn Saxon, MD Date of Appointment: 12/20/2019 Established Patient Visit  Chief complaint:   Chief Complaint  Patient presents with  . Follow-up    asthma. breathing better.  dry cough most of the time.      I connected with@ on 12/20/2019 by telephoneenabled telemedicine application and verified that I am speaking with the correct person using two identifiers. Patient was at home. Physician at work.    I discussed the limitations of evaluation and management by telemedicine. The patient expressed understanding and agreed to proceed.   HPI: Rachel [name] is a 69 y.o. woman who presents for follow up for: Chronic cough and dyspnea. Cough is with eating and she was started on PPI. Has wheezing and dyspnea, uses albuterol prn. Diagnosed with asthma in her 64s. No childhood respiratory disease.   Interval Updates:  GERD improved on PPI. Hasn't needed albuterol inhaler since starting breo.   Current Regimen: albuterol prn - taking 4-5 times/week. Breo. Montelukast.  Asthma Triggers: anxiety, exertion, seasonal allergies.  Exacerbations in the last year: none History of hospitalization or intubation: never Hives: none Allergy Testing: a long time ago, doesn't remember.  GERD: yes, on PPI Allergic Rhinitis: yes on montelukast.  Asthma Control Test:  Asthma Control Test ACT Total Score  12/20/2019 22  09/22/2019 18   I have reviewed the patient's family social and past medical history and updated as appropriate.   Past Medical History:  Diagnosis Date  . Allergic rhinitis   . Anemia   . Arthritis   . Asthma   . Blood clot in vein  age 61   left leg  . Depression   . GERD (gastroesophageal reflux disease)   . Hyperlipidemia   . Hypertension    pt denies, in physician record  . Hypothyroidism   . Kidney stones   . Multiple pregnancy loss, not currently pregnant    multiple  miscarriages-pt.unsure of #  . 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 Nephrolithiasis    hx of kidney stones, last 6 months ago  . Osteopenia   . Osteoporosis   . Parkinsonism ([place])   . Tendinitis    left foot  . Vitamin D deficiency     Past Surgical History:  Procedure Laterality Date  . BREAST EXCISIONAL BIOPSY Right   . BREAST SURGERY     rt. lumpectomy, benign  . colonscopy  2011  . cystoscopy  2012  . JOINT REPLACEMENT  05-2007   right knee replacment/revision  . KIDNEY STONE SURGERY    . REPLACEMENT TOTAL KNEE Left 06/05/12  . TOTAL KNEE REVISION  06/03/2012   Procedure: TOTAL KNEE REVISION;  Surgeon: Gearlean Alf, MD;  Location: [facility];  Service: Orthopedics;  Laterality: Left;  Revision of a Left Uni Knee to a Total Knee Arthroplasty    Family History  Problem Relation Age of Onset  . Diabetes Mother   . Heart failure Mother        CHF  . Thyroid disease Mother   . Heart failure Father        CHF  . Heart attack Brother   . Breast cancer Sister   . Asthma Sister   . Heart failure Sister     Social History   Occupational History  . Occupation: retired/disabled    Employer: [employer]    Comment: receptionist  Tobacco Use  .  Smoking status: Never Smoker  . Smokeless tobacco: Never Used  Substance and Sexual Activity  . Alcohol use: No    Alcohol/week: 0.0 standard drinks  . Drug use: No  . Sexual activity: Never    Partners: Male    Birth control/protection: Post-menopausal   Physical Exam: No audible wheezing. Able to complete full sentences   Data Reviewed: Imaging: I have personally reviewed the lung windows from her CT A/P in September 2020. Limited lung base evaluation shows mild peribronchial thickening. No pleural effusion or pneumonia.    PFTs:  PFT Results Latest Ref Rng & Units 08/20/2019  FVC-Pre L 2.35  FVC-Predicted Pre % 71  FVC-Post L 2.32  FVC-Predicted Post % 70  Pre FEV1/FVC % % 67  Post FEV1/FCV % % 68  FEV1-Pre L  1.56  FEV1-Predicted Pre % 62  FEV1-Post L 1.57  DLCO UNC% % 101  DLCO COR %Predicted % 132  TLC L 5.87  TLC % Predicted % 109  RV % Predicted % 141   I have personally reviewed the patient's PFTs and they are notable for moderate airflow limitation without a bronchodilator response.  FeNO 10 ppb - not elevated.   Labs: Lab Results  Component Value Date   WBC 7.5 11/14/2019   HGB 11.6 (L) 11/14/2019   HCT 35.7 (L) 11/14/2019   MCV 93.5 11/14/2019   PLT 230 11/14/2019    Immunization status: Immunization History  Administered Date(s) Administered  . PFIZER SARS-COV-2 Vaccination 09/19/2019, 10/13/2019  . Tdap 04/24/2013  . 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 Moderate intermittent asthma - improved control.  2.  Gastroesophageal reflux disease - controlled on PPI 3.  Allergic Rhinitis - well controlled on singulair and flonase  Plan/Recommendations: Continue Breo for asthma and prn albuterol.  Continue PPI for GERD Continue flonase and singulair for rhinitis. Well controlled.   I spent 20 minutes on 12/20/2019 in care of this patient including face to face time and non-face to face time spent charting, review of outside records, and coordination of care.  Return to Care: Return in about 6 months (around 06/21/2020).  Lenice Llamas, MD Pulmonary and [place]

## 2019-12-20 NOTE — Patient Instructions (Signed)
The patient should have follow up scheduled with myself in 6 months for asthma.

## 2019-12-21 DIAGNOSIS — H5203 Hypermetropia, bilateral: Secondary | ICD-10-CM | POA: Diagnosis not present

## 2020-01-24 ENCOUNTER — Telehealth: Payer: Self-pay | Admitting: Internal Medicine

## 2020-01-24 MED ORDER — PREDNISONE 10 MG PO TABS: 40.0000 mg | ORAL_TABLET | Freq: Every day | ORAL | 0 refills | Status: AC

## 2020-01-24 NOTE — Telephone Encounter (Signed)
Call in prednisone 40 mg/day for 5 days. Hopefully that will help till she is seen in office

## 2020-01-24 NOTE — Telephone Encounter (Signed)
Spoke with patient.  She states she has had 2 bad asthma attacks in the last week.  They occur at night and last night was the worst.  She could not even walk/move without getting sob, her husband had to push her in the chair.  She is using her Breo in the am and montelukast at night.  She has been using her Ventolin daily for the last week and twice yesterday.  She recently moved and is living with her son and is around a cat and she can smell bug killer that has been sprayed up the walls.  She was offered a visit with Aaron Edelman on 6/30, but she felt she needed something sooner. Dr. Vaughan Browner please advise.

## 2020-01-24 NOTE — Telephone Encounter (Signed)
Spoke with patient, Prednisone sent to patient's pharmacy.  Patient scheduled to f/u with Beth on 7/9, nothing available sooner.  Nothing further needed.

## 2020-01-28 ENCOUNTER — Ambulatory Visit (INDEPENDENT_AMBULATORY_CARE_PROVIDER_SITE_OTHER): Payer: Medicare Other | Admitting: Acute Care

## 2020-01-28 ENCOUNTER — Encounter: Payer: Self-pay | Admitting: Acute Care

## 2020-01-28 ENCOUNTER — Other Ambulatory Visit: Payer: Self-pay

## 2020-01-28 DIAGNOSIS — J45901 Unspecified asthma with (acute) exacerbation: Secondary | ICD-10-CM

## 2020-01-28 DIAGNOSIS — J4520 Mild intermittent asthma, uncomplicated: Secondary | ICD-10-CM

## 2020-01-28 NOTE — Patient Instructions (Signed)
It was good to talk with you today. We are glad you feel better after your prednisone taper. Continue your Breo one puff once daily Rise mouth after use. Continue Albuterol for rescue as needed.  We will place an order for a nebulizer machine and albuterol nebs as rescue.  Use the nebs for rescue while at home. Use the inhaler for rescue when away from home Do not use the albuterol nebs and inhaler at the same time, as they are the same medication.  If you find you are having to use your nebs with increased frequency, please call to be seen. We will refer you to Dr. Neldon Mc for allergy testing.  You should get a call to schedule.  Try to stay away from your sons cat.  Wash your hands frequently. Please contact office for sooner follow up if symptoms do not improve or worsen or seek emergency care  Follow up with Dr. Shearon Stalls in 3 month or before as needed.

## 2020-01-28 NOTE — Progress Notes (Signed)
Virtual Visit via Telephone Note  I connected with Rachel [name] on 01/28/20 at  3:30 PM EDT by telephone and verified that I am speaking with the correct person using two identifiers.  Location: Patient: At home Provider: [facility], [place], [place], [address]    I discussed the limitations, risks, security and privacy concerns of performing an evaluation and management service by telephone and the availability of in person appointments. I also discussed with the patient that there may be a patient responsible charge related to this service. The patient expressed understanding and agreed to proceed.  Synopsis: Rachel [name] is a 69 y.o. female never smoker  who presents for follow up for: Chronic cough and dyspnea. Cough is with eating .  She was started on PPI.  She endorses  wheezing and dyspnea, uses albuterol prn. Diagnosed with asthma in her 76s. No childhood respiratory disease.She is followed by Dr. Shearon Stalls   History of Present Illness: Pt. Presents for acute visit. She states she has had 2 bad asthma attacks in the last week.  They occur at night and last night was the worst.  She could not even walk/move without getting sob, her husband had to push her in the chair.  She is using her Breo in the am and montelukast at night.  She has been using her Ventolin daily for the last week and twice yesterday.  She recently moved and is living with her son and is around a cat and she can smell bug killer that has been sprayed up the walls.  Pt. States she had a asthma attack 01/24/2020. It lasted x 4 hours. She called the office and they called in a prednisone taper.  40 mg/day for 5 days . She states she is doing well after the pred taper. She has not had to use her rescue inhaler. She feels this was triggered in the evening.She is taking pantoprazole for this and she states her GERD is better. She feels her flare has resolved. She denies any further cough or wheezing. She is compliant with  her Breo and Singulair. She states she would like to have nebulized albuterol for her flares. She is concerned about new exposure to her sons cat. ( She has moved in with her sone and he has a cat) . She states she also feels there is bug spray in the walls of the new house that she feels may be triggering her asthma. She would like to go for allergy testing.    Observations/Objective:        Assessment and Plan: Asthma Flare  after new exposures in new home Improved after prednisone taper x 5 days Plan We are glad you feel better after your prednisone taper. Continue your Breo one puff once daily Rise mouth after use. Continue Albuterol for rescue as needed.  We will place an order for a nebulizer machine and albuterol nebs as rescue.  Use the nebs for rescue while at home. Use the inhaler for rescue when away from home Do not use the albuterol nebs and inhaler at the same time, as they are the same medication.  If you find you are having to use your nebs with increased frequency, please call to be seen. We will refer you to Dr. Neldon Mc for allergy testing.  You should get a call to schedule.  Try to stay away from your sons cat, or other triggers of your asthma.  Wash your hands frequently. Please contact office for sooner  follow up if symptoms do not improve or worsen or seek emergency care  Follow up with Dr. Shearon Stalls in 3 month or before as needed.     Follow Up Instructions: Follow up with Dr. Shearon Stalls in 3 months  Call us sooner if you need [place] sooner.      I discussed the assessment and treatment plan with the patient. The patient was provided an opportunity to ask questions and all were answered. The patient agreed with the plan and demonstrated an understanding of the instructions.   The patient was advised to call back or seek an in-person evaluation if the symptoms worsen or if the condition fails to improve as anticipated.  I provided 25 minutes of non-face-to-face time  during this encounter.   Magdalen Spatz, NP 01/28/2020

## 2020-01-29 ENCOUNTER — Other Ambulatory Visit: Payer: Self-pay | Admitting: Neurology

## 2020-02-01 ENCOUNTER — Other Ambulatory Visit: Payer: Self-pay | Admitting: *Deleted

## 2020-02-01 DIAGNOSIS — J4520 Mild intermittent asthma, uncomplicated: Secondary | ICD-10-CM

## 2020-02-01 DIAGNOSIS — J301 Allergic rhinitis due to pollen: Secondary | ICD-10-CM

## 2020-02-01 MED ORDER — ALBUTEROL SULFATE (2.5 MG/3ML) 0.083% IN NEBU: 2.5000 mg | INHALATION_SOLUTION | Freq: Four times a day (QID) | RESPIRATORY_TRACT | 11 refills | Status: DC | PRN

## 2020-02-02 ENCOUNTER — Telehealth: Payer: Self-pay | Admitting: Acute Care

## 2020-02-02 NOTE — Telephone Encounter (Signed)
Order for neb machine is currently showing in pending status as this was sent to [facility] 7/6. Called and spoke with pt letting her know that the order was sent for the neb machine and solution and stated to her that [facility] will contact her once able to deliver machine and meds. Pt verbalized understanding. Nothing further needed.

## 2020-02-04 ENCOUNTER — Inpatient Hospital Stay: Payer: Medicare Other | Admitting: Primary Care

## 2020-02-07 ENCOUNTER — Telehealth: Payer: Self-pay | Admitting: Internal Medicine

## 2020-02-07 MED ORDER — ALBUTEROL SULFATE (2.5 MG/3ML) 0.083% IN NEBU: 2.5000 mg | INHALATION_SOLUTION | Freq: Four times a day (QID) | RESPIRATORY_TRACT | 11 refills | Status: AC | PRN

## 2020-02-07 NOTE — Telephone Encounter (Signed)
Rx for albuterol was sent to [facility] electronically 02/01/2020.  [facility]ed [facility] to check status of pt receiving the med and they stated that the received a notification from pt that they were filling locally. rx has been sent to pt's preferred pharmacy.  Called and spoke with pt letting her know that the Rx was sent to [facility] off [address] for her and she verbalized understanding. Nothing further needed.

## 2020-02-16 DIAGNOSIS — R0602 Shortness of breath: Secondary | ICD-10-CM | POA: Diagnosis not present

## 2020-02-16 DIAGNOSIS — R069 Unspecified abnormalities of breathing: Secondary | ICD-10-CM | POA: Diagnosis not present

## 2020-02-16 DIAGNOSIS — R0902 Hypoxemia: Secondary | ICD-10-CM | POA: Diagnosis not present

## 2020-02-16 DIAGNOSIS — J80 Acute respiratory distress syndrome: Secondary | ICD-10-CM | POA: Diagnosis not present

## 2020-02-17 ENCOUNTER — Inpatient Hospital Stay (HOSPITAL_COMMUNITY): Payer: Medicare Other

## 2020-02-17 ENCOUNTER — Other Ambulatory Visit: Payer: Self-pay

## 2020-02-17 ENCOUNTER — Emergency Department (HOSPITAL_COMMUNITY): Payer: Medicare Other

## 2020-02-17 ENCOUNTER — Inpatient Hospital Stay (HOSPITAL_COMMUNITY)
Admission: EM | Admit: 2020-02-17 | Discharge: 2020-02-21 | DRG: 280 | Disposition: A | Discharge status: Home / Self Care | Payer: Medicare Other | Attending: Internal Medicine | Admitting: Internal Medicine

## 2020-02-17 DIAGNOSIS — E6609 Other obesity due to excess calories: Secondary | ICD-10-CM | POA: Diagnosis present

## 2020-02-17 DIAGNOSIS — J9601 Acute respiratory failure with hypoxia: Secondary | ICD-10-CM | POA: Diagnosis present

## 2020-02-17 DIAGNOSIS — I447 Left bundle-branch block, unspecified: Secondary | ICD-10-CM | POA: Diagnosis present

## 2020-02-17 DIAGNOSIS — I350 Nonrheumatic aortic (valve) stenosis: Secondary | ICD-10-CM | POA: Diagnosis not present

## 2020-02-17 DIAGNOSIS — I21A1 Myocardial infarction type 2: Secondary | ICD-10-CM | POA: Diagnosis not present

## 2020-02-17 DIAGNOSIS — Z888 Allergy status to other drugs, medicaments and biological substances status: Secondary | ICD-10-CM | POA: Diagnosis not present

## 2020-02-17 DIAGNOSIS — I429 Cardiomyopathy, unspecified: Secondary | ICD-10-CM | POA: Insufficient documentation

## 2020-02-17 DIAGNOSIS — Z825 Family history of asthma and other chronic lower respiratory diseases: Secondary | ICD-10-CM

## 2020-02-17 DIAGNOSIS — K219 Gastro-esophageal reflux disease without esophagitis: Secondary | ICD-10-CM | POA: Diagnosis present

## 2020-02-17 DIAGNOSIS — J189 Pneumonia, unspecified organism: Secondary | ICD-10-CM | POA: Diagnosis present

## 2020-02-17 DIAGNOSIS — I420 Dilated cardiomyopathy: Secondary | ICD-10-CM | POA: Diagnosis present

## 2020-02-17 DIAGNOSIS — E785 Hyperlipidemia, unspecified: Secondary | ICD-10-CM | POA: Diagnosis present

## 2020-02-17 DIAGNOSIS — E782 Mixed hyperlipidemia: Secondary | ICD-10-CM | POA: Insufficient documentation

## 2020-02-17 DIAGNOSIS — Z20822 Contact with and (suspected) exposure to covid-19: Secondary | ICD-10-CM | POA: Diagnosis present

## 2020-02-17 DIAGNOSIS — I110 Hypertensive heart disease with heart failure: Principal | ICD-10-CM | POA: Diagnosis present

## 2020-02-17 DIAGNOSIS — G20 Parkinson's disease: Secondary | ICD-10-CM | POA: Diagnosis not present

## 2020-02-17 DIAGNOSIS — M810 Age-related osteoporosis without current pathological fracture: Secondary | ICD-10-CM | POA: Diagnosis present

## 2020-02-17 DIAGNOSIS — M1990 Unspecified osteoarthritis, unspecified site: Secondary | ICD-10-CM | POA: Diagnosis present

## 2020-02-17 DIAGNOSIS — J9811 Atelectasis: Secondary | ICD-10-CM | POA: Diagnosis not present

## 2020-02-17 DIAGNOSIS — J90 Pleural effusion, not elsewhere classified: Secondary | ICD-10-CM | POA: Diagnosis not present

## 2020-02-17 DIAGNOSIS — J4540 Moderate persistent asthma, uncomplicated: Secondary | ICD-10-CM | POA: Diagnosis present

## 2020-02-17 DIAGNOSIS — Z86718 Personal history of other venous thrombosis and embolism: Secondary | ICD-10-CM

## 2020-02-17 DIAGNOSIS — Z6834 Body mass index (BMI) 34.0-34.9, adult: Secondary | ICD-10-CM | POA: Diagnosis not present

## 2020-02-17 DIAGNOSIS — I248 Other forms of acute ischemic heart disease: Secondary | ICD-10-CM | POA: Diagnosis present

## 2020-02-17 DIAGNOSIS — R0602 Shortness of breath: Secondary | ICD-10-CM | POA: Insufficient documentation

## 2020-02-17 DIAGNOSIS — J4521 Mild intermittent asthma with (acute) exacerbation: Secondary | ICD-10-CM | POA: Diagnosis not present

## 2020-02-17 DIAGNOSIS — I130 Hypertensive heart and chronic kidney disease with heart failure and stage 1 through stage 4 chronic kidney disease, or unspecified chronic kidney disease: Secondary | ICD-10-CM | POA: Insufficient documentation

## 2020-02-17 DIAGNOSIS — I5041 Acute combined systolic (congestive) and diastolic (congestive) heart failure: Secondary | ICD-10-CM

## 2020-02-17 DIAGNOSIS — J439 Emphysema, unspecified: Secondary | ICD-10-CM | POA: Diagnosis not present

## 2020-02-17 DIAGNOSIS — Z79899 Other long term (current) drug therapy: Secondary | ICD-10-CM

## 2020-02-17 DIAGNOSIS — D649 Anemia, unspecified: Secondary | ICD-10-CM | POA: Diagnosis present

## 2020-02-17 DIAGNOSIS — Z87442 Personal history of urinary calculi: Secondary | ICD-10-CM

## 2020-02-17 DIAGNOSIS — J9621 Acute and chronic respiratory failure with hypoxia: Secondary | ICD-10-CM | POA: Diagnosis present

## 2020-02-17 DIAGNOSIS — R7989 Other specified abnormal findings of blood chemistry: Secondary | ICD-10-CM | POA: Diagnosis not present

## 2020-02-17 DIAGNOSIS — I5043 Acute on chronic combined systolic (congestive) and diastolic (congestive) heart failure: Secondary | ICD-10-CM | POA: Diagnosis present

## 2020-02-17 DIAGNOSIS — R000 Tachycardia, unspecified: Secondary | ICD-10-CM | POA: Insufficient documentation

## 2020-02-17 DIAGNOSIS — Z79890 Hormone replacement therapy (postmenopausal): Secondary | ICD-10-CM | POA: Diagnosis not present

## 2020-02-17 DIAGNOSIS — R9431 Abnormal electrocardiogram [ECG] [EKG]: Secondary | ICD-10-CM

## 2020-02-17 DIAGNOSIS — R778 Other specified abnormalities of plasma proteins: Secondary | ICD-10-CM | POA: Insufficient documentation

## 2020-02-17 DIAGNOSIS — E039 Hypothyroidism, unspecified: Secondary | ICD-10-CM | POA: Diagnosis not present

## 2020-02-17 DIAGNOSIS — G232 Striatonigral degeneration: Secondary | ICD-10-CM | POA: Diagnosis present

## 2020-02-17 DIAGNOSIS — I700 Atherosclerosis of aorta: Secondary | ICD-10-CM | POA: Diagnosis not present

## 2020-02-17 DIAGNOSIS — Z8249 Family history of ischemic heart disease and other diseases of the circulatory system: Secondary | ICD-10-CM

## 2020-02-17 DIAGNOSIS — Z9889 Other specified postprocedural states: Secondary | ICD-10-CM | POA: Diagnosis not present

## 2020-02-17 DIAGNOSIS — I2510 Atherosclerotic heart disease of native coronary artery without angina pectoris: Secondary | ICD-10-CM | POA: Diagnosis not present

## 2020-02-17 LAB — LACTIC ACID, PLASMA: Lactic Acid, Venous: 1.2 mmol/L (ref 0.5–1.9)

## 2020-02-17 LAB — I-STAT VENOUS BLOOD GAS, ED
Acid-Base Excess: 3 mmol/L — ABNORMAL HIGH (ref 0.0–2.0)
Bicarbonate: 29.4 mmol/L — ABNORMAL HIGH (ref 20.0–28.0)
Calcium, Ion: 1.19 mmol/L (ref 1.15–1.40)
HCT: 37 % (ref 36.0–46.0)
Hemoglobin: 12.6 g/dL (ref 12.0–15.0)
O2 Saturation: 99 %
Potassium: 3.3 mmol/L — ABNORMAL LOW (ref 3.5–5.1)
Sodium: 143 mmol/L (ref 135–145)
TCO2: 31 mmol/L (ref 22–32)
pCO2, Ven: 49.8 mmHg (ref 44.0–60.0)
pH, Ven: 7.38 (ref 7.250–7.430)
pO2, Ven: 171 mmHg — ABNORMAL HIGH (ref 32.0–45.0)

## 2020-02-17 LAB — CBC WITH DIFFERENTIAL/PLATELET
Abs Immature Granulocytes: 0.02 10*3/uL (ref 0.00–0.07)
Basophils Absolute: 0.1 10*3/uL (ref 0.0–0.1)
Basophils Relative: 1 %
Eosinophils Absolute: 0.3 10*3/uL (ref 0.0–0.5)
Eosinophils Relative: 4 %
HCT: 39.9 % (ref 36.0–46.0)
Hemoglobin: 12.2 g/dL (ref 12.0–15.0)
Immature Granulocytes: 0 %
Lymphocytes Relative: 37 %
Lymphs Abs: 2.8 10*3/uL (ref 0.7–4.0)
MCH: 28.5 pg (ref 26.0–34.0)
MCHC: 30.6 g/dL (ref 30.0–36.0)
MCV: 93.2 fL (ref 80.0–100.0)
Monocytes Absolute: 0.6 10*3/uL (ref 0.1–1.0)
Monocytes Relative: 8 %
Neutro Abs: 3.8 10*3/uL (ref 1.7–7.7)
Neutrophils Relative %: 50 %
Platelets: 311 10*3/uL (ref 150–400)
RBC: 4.28 MIL/uL (ref 3.87–5.11)
RDW: 13.6 % (ref 11.5–15.5)
WBC: 7.6 10*3/uL (ref 4.0–10.5)
nRBC: 0 % (ref 0.0–0.2)

## 2020-02-17 LAB — ECHOCARDIOGRAM COMPLETE
Area-P 1/2: 6.54 cm2
Calc EF: 28.2 %
MV M vel: 4.77 m/s
MV Peak grad: 91 mmHg
Radius: 0.6 cm
S' Lateral: 4.1 cm
Single Plane A2C EF: 31.3 %
Single Plane A4C EF: 23.4 %

## 2020-02-17 LAB — BASIC METABOLIC PANEL
Anion gap: 11 (ref 5–15)
BUN: 13 mg/dL (ref 8–23)
CO2: 27 mmol/L (ref 22–32)
Calcium: 9.4 mg/dL (ref 8.9–10.3)
Chloride: 103 mmol/L (ref 98–111)
Creatinine, Ser: 1.05 mg/dL — ABNORMAL HIGH (ref 0.44–1.00)
GFR calc Af Amer: >60 mL/min (ref >60)
GFR calc non Af Amer: 54 mL/min — ABNORMAL LOW (ref >60)
Glucose, Bld: 151 mg/dL — ABNORMAL HIGH (ref 70–99)
Potassium: 3.5 mmol/L (ref 3.5–5.1)
Sodium: 141 mmol/L (ref 135–145)

## 2020-02-17 LAB — TROPONIN I (HIGH SENSITIVITY)
Troponin I (High Sensitivity): 12 ng/L (ref <18)
Troponin I (High Sensitivity): 53 ng/L — ABNORMAL HIGH (ref <18)
Troponin I (High Sensitivity): 78 ng/L — ABNORMAL HIGH (ref <18)
Troponin I (High Sensitivity): 84 ng/L — ABNORMAL HIGH (ref <18)
Troponin I (High Sensitivity): 72 ng/L — ABNORMAL HIGH (ref <18)

## 2020-02-17 LAB — HIV ANTIBODY (ROUTINE TESTING W REFLEX): HIV Screen 4th Generation wRfx: NONREACTIVE

## 2020-02-17 LAB — SARS CORONAVIRUS 2 BY RT PCR (HOSPITAL ORDER, PERFORMED IN ~~LOC~~ HOSPITAL LAB): SARS Coronavirus 2: NEGATIVE

## 2020-02-17 LAB — D-DIMER, QUANTITATIVE: D-Dimer, Quant: 0.68 ug/mL-FEU — ABNORMAL HIGH (ref 0.00–0.50)

## 2020-02-17 LAB — BRAIN NATRIURETIC PEPTIDE: B Natriuretic Peptide: 484.2 pg/mL — ABNORMAL HIGH (ref 0.0–100.0)

## 2020-02-17 MED ORDER — DOXYCYCLINE HYCLATE 100 MG PO TABS
100.0000 mg | ORAL_TABLET | Freq: Two times a day (BID) | ORAL | Status: DC
  Administered 2020-02-17 – 2020-02-18 (×2): 100 mg via ORAL
  Filled 2020-02-17 (×2): qty 1

## 2020-02-17 MED ORDER — PANTOPRAZOLE SODIUM 40 MG PO TBEC: 40.0000 mg | DELAYED_RELEASE_TABLET | Freq: Every day | ORAL | Status: DC

## 2020-02-17 MED ORDER — SODIUM CHLORIDE 0.9 % IV SOLN
500.0000 mg | Freq: Once | INTRAVENOUS | Status: AC
  Administered 2020-02-17: 500 mg via INTRAVENOUS
  Filled 2020-02-17: qty 500

## 2020-02-17 MED ORDER — SODIUM CHLORIDE 0.9 % IV SOLN
2.0000 g | INTRAVENOUS | Status: DC
  Filled 2020-02-17 (×2): qty 20

## 2020-02-17 MED ORDER — FUROSEMIDE 10 MG/ML IJ SOLN
20.0000 mg | Freq: Every day | INTRAMUSCULAR | Status: DC
  Administered 2020-02-18: 20 mg via INTRAVENOUS
  Filled 2020-02-17 (×2): qty 2

## 2020-02-17 MED ORDER — ADULT MULTIVITAMIN W/MINERALS CH
2.0000 | ORAL_TABLET | Freq: Every day | ORAL | Status: DC
  Administered 2020-02-17 – 2020-02-21 (×5): 2 via ORAL
  Filled 2020-02-17 (×5): qty 2

## 2020-02-17 MED ORDER — LINACLOTIDE 145 MCG PO CAPS
290.0000 ug | ORAL_CAPSULE | Freq: Every day | ORAL | Status: DC
  Administered 2020-02-18 – 2020-02-21 (×4): 290 ug via ORAL
  Filled 2020-02-17 (×5): qty 2

## 2020-02-17 MED ORDER — AMANTADINE HCL 100 MG PO CAPS
100.0000 mg | ORAL_CAPSULE | Freq: Three times a day (TID) | ORAL | Status: DC
  Administered 2020-02-17 – 2020-02-21 (×13): 100 mg via ORAL
  Filled 2020-02-17 (×16): qty 1

## 2020-02-17 MED ORDER — CARBIDOPA-LEVODOPA ER 25-100 MG PO TBCR
1.0000 | EXTENDED_RELEASE_TABLET | Freq: Every day | ORAL | Status: DC
  Administered 2020-02-17 – 2020-02-21 (×25): 1 via ORAL
  Filled 2020-02-17 (×30): qty 1

## 2020-02-17 MED ORDER — ACETAMINOPHEN 325 MG PO TABS
650.0000 mg | ORAL_TABLET | Freq: Four times a day (QID) | ORAL | Status: DC | PRN
  Administered 2020-02-17: 650 mg via ORAL
  Filled 2020-02-17: qty 2

## 2020-02-17 MED ORDER — SODIUM CHLORIDE 0.9 % IV SOLN: 500.0000 mg | INTRAVENOUS | Status: DC

## 2020-02-17 MED ORDER — TAMSULOSIN HCL 0.4 MG PO CAPS
0.4000 mg | ORAL_CAPSULE | Freq: Every day | ORAL | Status: DC
  Administered 2020-02-17 – 2020-02-21 (×5): 0.4 mg via ORAL
  Filled 2020-02-17 (×5): qty 1

## 2020-02-17 MED ORDER — ENOXAPARIN SODIUM 40 MG/0.4ML ~~LOC~~ SOLN
40.0000 mg | SUBCUTANEOUS | Status: DC
  Administered 2020-02-17 – 2020-02-20 (×4): 40 mg via SUBCUTANEOUS
  Filled 2020-02-17 (×4): qty 0.4

## 2020-02-17 MED ORDER — IOHEXOL 350 MG/ML SOLN
80.0000 mL | Freq: Once | INTRAVENOUS | Status: AC | PRN
  Administered 2020-02-17: 80 mL via INTRAVENOUS

## 2020-02-17 MED ORDER — CARBIDOPA-LEVODOPA ER 25-100 MG PO TBCR
1.0000 | EXTENDED_RELEASE_TABLET | Freq: Two times a day (BID) | ORAL | Status: DC
  Administered 2020-02-17: 1 via ORAL
  Filled 2020-02-17 (×2): qty 1

## 2020-02-17 MED ORDER — AMANTADINE HCL 100 MG PO CAPS
100.0000 mg | ORAL_CAPSULE | Freq: Two times a day (BID) | ORAL | Status: DC
  Administered 2020-02-17: 100 mg via ORAL
  Filled 2020-02-17 (×2): qty 1

## 2020-02-17 MED ORDER — MOMETASONE FURO-FORMOTEROL FUM 100-5 MCG/ACT IN AERO
2.0000 | INHALATION_SPRAY | Freq: Two times a day (BID) | RESPIRATORY_TRACT | Status: DC
  Administered 2020-02-17 – 2020-02-20 (×7): 2 via RESPIRATORY_TRACT
  Filled 2020-02-17: qty 8.8

## 2020-02-17 MED ORDER — FLUTICASONE FUROATE-VILANTEROL 100-25 MCG/INH IN AEPB
1.0000 | INHALATION_SPRAY | Freq: Every day | RESPIRATORY_TRACT | Status: DC
  Administered 2020-02-18 – 2020-02-20 (×3): 1 via RESPIRATORY_TRACT
  Filled 2020-02-17: qty 28

## 2020-02-17 MED ORDER — CITALOPRAM HYDROBROMIDE 20 MG PO TABS
20.0000 mg | ORAL_TABLET | Freq: Every day | ORAL | Status: DC
  Administered 2020-02-17 – 2020-02-19 (×3): 20 mg via ORAL
  Filled 2020-02-17 (×2): qty 1
  Filled 2020-02-17: qty 2

## 2020-02-17 MED ORDER — SODIUM CHLORIDE 0.9 % IV SOLN
1.0000 g | Freq: Once | INTRAVENOUS | Status: AC
  Administered 2020-02-17: 1 g via INTRAVENOUS
  Filled 2020-02-17: qty 10

## 2020-02-17 MED ORDER — PREDNISONE 20 MG PO TABS
40.0000 mg | ORAL_TABLET | Freq: Every day | ORAL | Status: DC
  Administered 2020-02-17 – 2020-02-19 (×3): 40 mg via ORAL
  Filled 2020-02-17 (×3): qty 2

## 2020-02-17 MED ORDER — LEVOTHYROXINE SODIUM 112 MCG PO TABS
112.0000 ug | ORAL_TABLET | Freq: Every day | ORAL | Status: DC
  Administered 2020-02-17 – 2020-02-21 (×5): 112 ug via ORAL
  Filled 2020-02-17 (×6): qty 1

## 2020-02-17 MED ORDER — PANTOPRAZOLE SODIUM 40 MG PO TBEC
40.0000 mg | DELAYED_RELEASE_TABLET | Freq: Every day | ORAL | Status: DC
  Administered 2020-02-17 – 2020-02-21 (×6): 40 mg via ORAL
  Filled 2020-02-17 (×5): qty 1

## 2020-02-17 MED ORDER — FUROSEMIDE 20 MG PO TABS
20.0000 mg | ORAL_TABLET | Freq: Every day | ORAL | Status: DC
  Administered 2020-02-17: 20 mg via ORAL
  Filled 2020-02-17: qty 1

## 2020-02-17 MED ORDER — ALUM & MAG HYDROXIDE-SIMETH 200-200-20 MG/5ML PO SUSP
30.0000 mL | Freq: Once | ORAL | Status: AC
  Administered 2020-02-17: 30 mL via ORAL
  Filled 2020-02-17: qty 30

## 2020-02-17 MED ORDER — FLUTICASONE PROPIONATE 50 MCG/ACT NA SUSP
1.0000 | Freq: Every day | NASAL | Status: DC
  Administered 2020-02-18 – 2020-02-21 (×5): 1 via NASAL
  Filled 2020-02-17 (×2): qty 16

## 2020-02-17 MED ORDER — ALBUTEROL SULFATE (2.5 MG/3ML) 0.083% IN NEBU: 2.5000 mg | INHALATION_SOLUTION | Freq: Four times a day (QID) | RESPIRATORY_TRACT | Status: DC | PRN

## 2020-02-17 MED ORDER — MONTELUKAST SODIUM 10 MG PO TABS
10.0000 mg | ORAL_TABLET | Freq: Every day | ORAL | Status: DC
  Administered 2020-02-17 – 2020-02-20 (×4): 10 mg via ORAL
  Filled 2020-02-17 (×4): qty 1

## 2020-02-17 MED ORDER — METOPROLOL TARTRATE 12.5 MG HALF TABLET
12.5000 mg | ORAL_TABLET | Freq: Two times a day (BID) | ORAL | Status: AC
  Administered 2020-02-18 – 2020-02-19 (×4): 12.5 mg via ORAL
  Filled 2020-02-17 (×4): qty 1

## 2020-02-17 NOTE — ED Notes (Signed)
Xray tech and this RN reposition pt, pt co of struggling to breathe with BiPAP due to per hose connection, hose adjusted and tighten, pt co to reports SOB, pt breathing labored, tachycardiac 128. This RN contacted RT, pt placed on 15L non breather, SPO2 100%, pt able to slow respirations with coaching, HOB >30 degrees, pt able to speak in complete sentences. Pt made aware RT will be present to adjust and replace pt on BiPAP.

## 2020-02-17 NOTE — ED Notes (Signed)
Portable Xray at bedside.

## 2020-02-17 NOTE — ED Notes (Signed)
Pt spouse contacted per pt request.

## 2020-02-17 NOTE — ED Notes (Signed)
Hospitalist at bedside, verbal order for BiPAP removal, to be replaced with non breather. RT contacted at this time. Pt made aware, with verbalized understanding.

## 2020-02-17 NOTE — ED Notes (Signed)
Pt reporting acid reflux post medications administration, provider made aware.

## 2020-02-17 NOTE — ED Notes (Signed)
Pt requesting medication for restless leg and Parkinson's, provider made aware.

## 2020-02-17 NOTE — ED Triage Notes (Signed)
Pt presented via EMS co SOB x1hr following neb treatment, per EMS hx asthma, at arrival 92% at placed on BiPAP downgraded to 12L nonbreather 95%. EMS gave 20mg  Albuterol, 1 advent, 125mg  Solumedrol, 2mg  magnesium.

## 2020-02-17 NOTE — ED Notes (Signed)
RT at bedside.

## 2020-02-17 NOTE — ED Provider Notes (Signed)
[facility] EMERGENCY DEPARTMENT Provider Note   CSN: [id number] Arrival date & time: 02/17/20  0015     History Chief Complaint  Patient presents with  . Shortness of Breath    Rachel [name] is a 69 y.o. female.  Patient presents to the emergency department for evaluation of shortness of breath.  Patient reports a previous history of asthma, suddenly became short of breath approximately 1 hour before coming to the ER.  EMS transported her to the hospital from home.  She was in respiratory distress, received albuterol 20 mg, Atrovent 1 mg, Solu-Medrol 125 mg, magnesium 2 g during transport.  Patient arrives at the ER on CPAP with some improvement in her breathing.        Past Medical History:  Diagnosis Date  . Allergic rhinitis   . Anemia   . Arthritis   . Asthma   . Blood clot in vein  age 60   left leg  . Depression   . GERD (gastroesophageal reflux disease)   . Hyperlipidemia   . Hypertension    pt denies, in physician record  . Hypothyroidism   . Kidney stones   . Multiple pregnancy loss, not currently pregnant    multiple miscarriages-pt.unsure of #  . 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 Nephrolithiasis    hx of kidney stones, last 6 months ago  . Osteopenia   . Osteoporosis   . Parkinsonism ([place])   . Tendinitis    left foot  . Vitamin D deficiency     Patient Active Problem List   Diagnosis Date Noted  . 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 Thrush 06/15/2012  . S/P total knee arthroplasty 06/15/2012  . Fall 06/13/2012  . Urinary incontinence 06/13/2012  . Postop Acute blood loss anemia 06/04/2012  . Postop Hypokalemia 06/04/2012  . Pain due to unicompartmental arthroplasty of knee ([place]) 06/03/2012  . GERD 03/18/2008  . GI BLEED 03/18/2008  . DYSPHAGIA 03/18/2008  . FECAL OCCULT BLOOD 03/18/2008    Past Surgical History:  Procedure Laterality Date  . BREAST EXCISIONAL BIOPSY Right   . BREAST  SURGERY     rt. lumpectomy, benign  . colonscopy  2011  . cystoscopy  2012  . JOINT REPLACEMENT  05-2007   right knee replacment/revision  . KIDNEY STONE SURGERY    . REPLACEMENT TOTAL KNEE Left 06/05/12  . TOTAL KNEE REVISION  06/03/2012   Procedure: TOTAL KNEE REVISION;  Surgeon: Gearlean Alf, MD;  Location: [facility];  Service: Orthopedics;  Laterality: Left;  Revision of a Left Uni Knee to a Total Knee Arthroplasty     OB History   No obstetric history on file.    Obstetric Comments  Multiple miscarriages.  1 adopted child.        Family History  Problem Relation Age of Onset  . Diabetes Mother   . Heart failure Mother        CHF  . Thyroid disease Mother   . Heart failure Father        CHF  . Heart attack Brother   . Breast cancer Sister   . Asthma Sister   . Heart failure Sister     Social History   Tobacco Use  . Smoking status: Never Smoker  . Smokeless tobacco: Never Used  Substance Use Topics  . Alcohol use: No    Alcohol/week: 0.0 standard drinks  . Drug use: No    Home Medications  Prior to Admission medications   Medication Sig Start Date End Date Taking? Authorizing Provider  albuterol (PROVENTIL) (2.5 MG/3ML) 0.083% nebulizer solution Take 3 mLs (2.5 mg total) by nebulization every 6 (six) hours as needed for wheezing or shortness of breath. 02/07/20   Magdalen Spatz, NP  amantadine (SYMMETREL) 100 MG capsule TAKE 1 CAPSULE BY MOUTH THREE TIMES DAILY 02/01/20   Tat, Eustace Quail, DO  BREO ELLIPTA 100-25 MCG/INH AEPB Inhale 1 puff into the lungs daily. 10/28/19   [provider]  Carbidopa-Levodopa ER (SINEMET CR) 25-100 MG tablet controlled release TAKE 1 TABLET BY MOUTH SIX TIMES DAILY 02/01/20   Tat, Eustace Quail, DO  citalopram (CELEXA) 10 MG tablet Take 1 tablet (10 mg total) by mouth daily. 08/25/18   Tat, Eustace Quail, DO  fluticasone (FLONASE) 50 MCG/ACT nasal spray Place 1 spray into both nostrils daily. 06/21/19   Spero Geralds, MD  furosemide  (LASIX) 20 MG tablet Take 20 mg by mouth daily.    [provider]  levothyroxine (SYNTHROID) 112 MCG tablet Take 112 mcg by mouth daily. 11/09/19   [provider]  LINZESS 290 MCG CAPS capsule Take 290 mcg by mouth daily. 10/20/19   [provider]  montelukast (SINGULAIR) 10 MG tablet Take 1 tablet (10 mg total) by mouth at bedtime. 06/21/19   Spero Geralds, MD  Multiple Vitamin (MULTIVITAMIN WITH MINERALS) TABS tablet Take 2 tablets by mouth daily.    [provider]  ondansetron (ZOFRAN ODT) 4 MG disintegrating tablet Take 1 tablet (4 mg total) by mouth every 8 (eight) hours as needed for nausea or vomiting. 11/14/19   Maudie Flakes, MD  OVER THE COUNTER MEDICATION CBD oil - 8.3 mg daily    [provider]  oxyCODONE (ROXICODONE) 5 MG immediate release tablet Take 1 tablet (5 mg total) by mouth every 4 (four) hours as needed for severe pain. 11/14/19   Maudie Flakes, MD  pantoprazole (PROTONIX) 40 MG tablet Take 40 mg by mouth daily. 06/18/19   [provider]  tamsulosin (FLOMAX) 0.4 MG CAPS capsule Take 1 capsule (0.4 mg total) by mouth daily. 11/14/19   Maudie Flakes, MD  VITAMIN D PO Take 2 drops by mouth daily.    [provider]  vitamin E 100 UNIT capsule Take 200 Units by mouth daily.     [provider]  VOLTAREN 1 % GEL Apply 1 application topically 4 (four) times daily as needed (pain).  01/25/14   [provider]    Allergies    Fosamax [alendronate] and Symbicort [budesonide-formoterol fumarate]  Review of Systems   Review of Systems  Respiratory: Positive for shortness of breath.   All other systems reviewed and are negative.   Physical Exam Updated Vital Signs BP (!) 165/86   Pulse (!) 106   Temp 98.9 F (37.2 C) (Axillary)   Resp 20   SpO2 100%   Physical Exam Vitals and nursing note reviewed.  Constitutional:      General: She is not in acute distress.    Appearance: Normal  appearance. She is well-developed.  HENT:     Head: Normocephalic and atraumatic.     Right Ear: Hearing normal.     Left Ear: Hearing normal.     Nose: Nose normal.  Eyes:     Conjunctiva/sclera: Conjunctivae normal.     Pupils: Pupils are equal, round, and reactive to light.  Cardiovascular:  Rate and Rhythm: Regular rhythm.     Heart sounds: S1 normal and S2 normal. No murmur heard.  No friction rub. No gallop.   Pulmonary:     Effort: Tachypnea, accessory muscle usage and prolonged expiration present.     Breath sounds: Decreased breath sounds and wheezing present.  Chest:     Chest wall: No tenderness.  Abdominal:     General: Bowel sounds are normal.     Palpations: Abdomen is soft.     Tenderness: There is no abdominal tenderness. There is no guarding or rebound. Negative signs include Murphy's sign and McBurney's sign.     Hernia: No hernia is present.  Musculoskeletal:        General: Normal range of motion.     Cervical back: Normal range of motion and neck supple.  Skin:    General: Skin is warm and dry.     Findings: No rash.  Neurological:     Mental Status: She is alert and oriented to person, place, and time.     GCS: GCS eye subscore is 4. GCS verbal subscore is 5. GCS motor subscore is 6.     Cranial Nerves: No cranial nerve deficit.     Sensory: No sensory deficit.     Coordination: Coordination normal.  Psychiatric:        Speech: Speech normal.        Behavior: Behavior normal.        Thought Content: Thought content normal.     ED Results / Procedures / Treatments   Labs (all labs ordered are listed, but only abnormal results are displayed) Labs Reviewed  BASIC METABOLIC PANEL - Abnormal; Notable for the following components:      Result Value   Glucose, Bld 151 (*)    Creatinine, Ser 1.05 (*)    GFR calc non Af Amer 54 (*)    All other components within normal limits  SARS CORONAVIRUS 2 BY RT PCR (HOSPITAL ORDER, [facility])  CULTURE, BLOOD (ROUTINE X 2)  CULTURE, BLOOD (ROUTINE X 2)  CBC WITH DIFFERENTIAL/PLATELET  BRAIN NATRIURETIC PEPTIDE  LACTIC ACID, PLASMA  TROPONIN I (HIGH SENSITIVITY)    EKG EKG Interpretation  Date/Time:  Thursday February 17 2020 00:16:44 EDT Ventricular Rate:  112 PR Interval:    QRS Duration: 139 QT Interval:  373 QTC Calculation: 510 R Axis:   -84 Text Interpretation: Sinus tachycardia Left bundle branch block No significant change since last tracing Confirmed by Orpah Greek [phone]) on 02/17/2020 1:26:01 AM   Radiology DG Chest Port 1 View  Result Date: 02/17/2020 CLINICAL DATA:  Shortness of breath. EXAM: PORTABLE CHEST 1 VIEW COMPARISON:  June 01, 2012. FINDINGS: There is a right lower lobe airspace opacity concerning for pneumonia. There is a linear opacity at the left lung base suggestive of atelectasis. Underlying emphysematous changes are suspected. The heart size is normal. There is prominence of the right hilum which may be in part due to patient rotation. No pneumothorax. There are trace bilateral pleural effusions. There is no acute osseous abnormality. IMPRESSION: 1. Findings concerning for right lower lobe pneumonia. 2. Trace bilateral pleural effusions. 3. Probable atelectasis at the left lung base. 4. Prominence of the right hilum may be in part due to patient rotation. A short interval repeat two-view chest x-ray is recommended. Electronically Signed   By: Constance Holster M.D.   On: 02/17/2020 00:55    Procedures Procedures (including critical  care time)  Medications Ordered in ED Medications  amantadine (SYMMETREL) capsule 100 mg (has no administration in time range)  Carbidopa-Levodopa ER (SINEMET CR) 25-100 MG tablet controlled release 1 tablet (has no administration in time range)  cefTRIAXone (ROCEPHIN) 1 g in sodium chloride 0.9 % 100 mL IVPB (has no administration in time range)  azithromycin (ZITHROMAX) 500 mg in sodium  chloride 0.9 % 250 mL IVPB (has no administration in time range)    ED Course  I have reviewed the triage vital signs and the nursing notes.  Pertinent labs & imaging results that were available during my care of the patient were reviewed by me and considered in my medical decision making (see chart for details).    MDM Rules/Calculators/A&P                          Patient presents to the emergency department for evaluation of respiratory distress.  Patient has a history of asthma, followed by [facility] pulmonology.  Patient reports onset of increased shortness of breath this evening refractory to nebulizer treatments.  Patient given maximal treatment by EMS during transport with only some improvement.  She arrived on CPAP and was continued on BiPAP.  X-ray concerning for right lower lobe pneumonia.  Add Rocephin and Zithromax, will admit for further management.  CRITICAL CARE Performed by: Orpah Greek   Total critical care time: 35 minutes  Critical care time was exclusive of separately billable procedures and treating other patients.  Critical care was necessary to treat or prevent imminent or life-threatening deterioration.  Critical care was time spent personally by me on the following activities: development of treatment plan with patient and/or surrogate as well as nursing, discussions with consultants, evaluation of patient's response to treatment, examination of patient, obtaining history from patient or surrogate, ordering and performing treatments and interventions, ordering and review of laboratory studies, ordering and review of radiographic studies, pulse oximetry and re-evaluation of patient's condition.  Final Clinical Impression(s) / ED Diagnoses Final diagnoses:  Acute on chronic respiratory failure with hypoxia ([place])  Community acquired pneumonia of right lower lobe of lung    Rx / DC Orders ED Discharge Orders    None       Redonna Wilbert, Gwenyth Allegra,  MD 02/17/20 0131

## 2020-02-17 NOTE — Progress Notes (Signed)
Per HPI: Rachel [name] is a 69 y.o. female with medical history significant of asthma, Parkinson's dz.  Pt presents to the ED with sudden onset respiratory distress.  Pt with onset of SOB, wheezing 1h PTA.  EMS called to home.  Pt given albuterol 20 mg, Atrovent 1 mg, Solu-Medrol 125 mg, magnesium 2 g during transport. Patient arrives at the ER on CPAP with some improvement in her breathing.   ED Course: Pt very wheezy on arrival.  Put on BIPAP.  Further neb treatments.  CXR reveals RLL PNA.  WBC nl, no fever.  ABx started, pt improved significantly on bipap (or the steroids have just had time to kick in).  -Patient seen and evaluated at bedside.  She has been admitted after midnight with community-acquired pneumonia of the right lower lobe with associated acute hypoxemic respiratory failure.  She was initially in some respiratory distress requiring BiPAP and now has been transitioned to 3.5 L nasal cannula oxygen.  She is typically on room air at home.  She has been started on Rocephin and azithromycin along with prednisone daily.  Breathing treatments have been ordered as needed every 6 hours for shortness of breath or wheezing.  She has been noted to have some mild troponin elevations with some initial chest pain, but she declines any further chest pain.  Plan to check 2D echocardiogram to ensure no acute abnormalities as I suspect much of this is demand ischemia.  Continue telemetry monitoring.  Plans to recheck labs in a.m.  Total care time: 30 minutes.

## 2020-02-17 NOTE — H&P (Addendum)
History and Physical    [name] XBD:[id number] DOB: 1951-01-23 DOA: 02/17/2020  PCP: Marton Redwood, MD  Patient coming from: Home  I have personally briefly reviewed patient's old medical records in [place]  Chief Complaint: Respiratory distress  HPI: Rachel [name] is a 69 y.o. female with medical history significant of asthma, Parkinson's dz.  Pt presents to the ED with sudden onset respiratory distress.  Pt with onset of SOB, wheezing 1h PTA.  EMS called to home.  Pt given albuterol 20 mg, Atrovent 1 mg, Solu-Medrol 125 mg, magnesium 2 g during transport.  Patient arrives at the ER on CPAP with some improvement in her breathing.   ED Course: Pt very wheezy on arrival.  Put on BIPAP.  Further neb treatments.  CXR reveals RLL PNA.  WBC nl, no fever.  ABx started, pt improved significantly on bipap (or the steroids have just had time to kick in).   Review of Systems: As per HPI, otherwise all review of systems negative.  Past Medical History:  Diagnosis Date  . Allergic rhinitis   . Anemia   . Arthritis   . Asthma   . Blood clot in vein  age 46   left leg  . Depression   . GERD (gastroesophageal reflux disease)   . Hyperlipidemia   . Hypertension    pt denies, in physician record  . Hypothyroidism   . Kidney stones   . Multiple pregnancy loss, not currently pregnant    multiple miscarriages-pt.unsure of #  . 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 Nephrolithiasis    hx of kidney stones, last 6 months ago  . Osteopenia   . Osteoporosis   . Parkinsonism ([place])   . Tendinitis    left foot  . Vitamin D deficiency     Past Surgical History:  Procedure Laterality Date  . BREAST EXCISIONAL BIOPSY Right   . BREAST SURGERY     rt. lumpectomy, benign  . colonscopy  2011  . cystoscopy  2012  . JOINT REPLACEMENT  05-2007   right knee replacment/revision  . KIDNEY STONE SURGERY    . REPLACEMENT TOTAL KNEE Left 06/05/12  . TOTAL KNEE REVISION  06/03/2012     Procedure: TOTAL KNEE REVISION;  Surgeon: Gearlean Alf, MD;  Location: [facility];  Service: Orthopedics;  Laterality: Left;  Revision of a Left Uni Knee to a Total Knee Arthroplasty     reports that she has never smoked. She has never used smokeless tobacco. She reports that she does not drink alcohol and does not use drugs.  Allergies  Allergen Reactions  . Fosamax [Alendronate] Other (See Comments)    headache  . Symbicort [Budesonide-Formoterol Fumarate] Other (See Comments)    Shakes and muscle weakness    Family History  Problem Relation Age of Onset  . Diabetes Mother   . Heart failure Mother        CHF  . Thyroid disease Mother   . Heart failure Father        CHF  . Heart attack Brother   . Breast cancer Sister   . Asthma Sister   . Heart failure Sister      Prior to Admission medications   Medication Sig Start Date End Date Taking? Authorizing Provider  albuterol (PROVENTIL) (2.5 MG/3ML) 0.083% nebulizer solution Take 3 mLs (2.5 mg total) by nebulization every 6 (six) hours as needed for wheezing or shortness of breath. 02/07/20  Yes Magdalen Spatz, NP  amantadine (SYMMETREL) 100 MG capsule TAKE 1 CAPSULE BY MOUTH THREE TIMES DAILY Patient taking differently: Take 100 mg by mouth in the morning, at noon, and at bedtime.  02/01/20  Yes Tat, Eustace Quail, DO  BREO ELLIPTA 100-25 MCG/INH AEPB Inhale 1 puff into the lungs daily. 10/28/19  Yes [provider]  Carbidopa-Levodopa ER (SINEMET CR) 25-100 MG tablet controlled release TAKE 1 TABLET BY MOUTH SIX TIMES DAILY Patient taking differently: Take 1 tablet by mouth 6 (six) times daily.  02/01/20  Yes Tat, Eustace Quail, DO  citalopram (CELEXA) 20 MG tablet Take 20 mg by mouth daily. 12/13/19  Yes [provider]  fluticasone (FLONASE) 50 MCG/ACT nasal spray Place 1 spray into both nostrils daily. 06/21/19  Yes Spero Geralds, MD  furosemide (LASIX) 20 MG tablet Take 20 mg by mouth daily.   Yes [provider]  levothyroxine (SYNTHROID) 112 MCG tablet Take 112 mcg by mouth daily. 11/09/19  Yes [provider]  LINZESS 290 MCG CAPS capsule Take 290 mcg by mouth daily. 10/20/19  Yes [provider]  montelukast (SINGULAIR) 10 MG tablet Take 1 tablet (10 mg total) by mouth at bedtime. 06/21/19  Yes Spero Geralds, MD  Multiple Vitamin (MULTIVITAMIN WITH MINERALS) TABS tablet Take 2 tablets by mouth daily.   Yes [provider]  ondansetron (ZOFRAN ODT) 4 MG disintegrating tablet Take 1 tablet (4 mg total) by mouth every 8 (eight) hours as needed for nausea or vomiting. 11/14/19  Yes Maudie Flakes, MD  pantoprazole (PROTONIX) 40 MG tablet Take 40 mg by mouth at bedtime.  06/18/19  Yes [provider]  tamsulosin (FLOMAX) 0.4 MG CAPS capsule Take 1 capsule (0.4 mg total) by mouth daily. 11/14/19  Yes Maudie Flakes, MD  VITAMIN D PO Take 2 drops by mouth daily.   Yes [provider]  vitamin E 100 UNIT capsule Take 200 Units by mouth daily.    Yes [provider]  VOLTAREN 1 % GEL Apply 1 application topically 4 (four) times daily as needed (pain).  01/25/14  Yes [provider]    Physical Exam: Vitals:   02/17/20 0200 02/17/20 0230 02/17/20 0300 02/17/20 0330  BP: (!) 154/85 (!) 164/97 (!) 157/99 (!) 147/76  Pulse: 100 (!) 104 (!) 104 92  Resp: (!) 21 18 16 18   Temp:      TempSrc:      SpO2: 100% 100% 100% 100%    Constitutional: NAD, calm, comfortable Eyes: PERRL, lids and conjunctivae normal ENMT: Mucous membranes are moist. Posterior pharynx clear of any exudate or lesions.Normal dentition.  Neck: normal, supple, no masses, no thyromegaly Respiratory: Diffuse wheezing, no accessory muscle use on BIPAP Cardiovascular: Regular rate and rhythm, no murmurs / rubs / gallops. No extremity edema. 2+ pedal pulses. No carotid bruits.  Abdomen: no tenderness, no masses palpated. No hepatosplenomegaly. Bowel sounds positive.    Musculoskeletal: no clubbing / cyanosis. No joint deformity upper and lower extremities. Good ROM, no contractures. Normal muscle tone.  Skin: no rashes, lesions, ulcers. No induration Neurologic: CN 2-12 grossly intact. Sensation intact, DTR normal. Strength 5/5 in all 4.  Psychiatric: Normal judgment and insight. Alert and oriented x 3. Normal mood.    Labs on Admission: I have personally reviewed following labs and imaging studies  CBC: Recent Labs  Lab 02/17/20 0021 02/17/20 0256  WBC 7.6  --   NEUTROABS 3.8  --   HGB 12.2 12.6  HCT  39.9 37.0  MCV 93.2  --   PLT 311  --    Basic Metabolic Panel: Recent Labs  Lab 02/17/20 0021 02/17/20 0256  NA 141 143  K 3.5 3.3*  CL 103  --   CO2 27  --   GLUCOSE 151*  --   BUN 13  --   CREATININE 1.05*  --   CALCIUM 9.4  --    GFR: CrCl cannot be calculated (Unknown ideal weight.). Liver Function Tests: No results for input(s): AST, ALT, ALKPHOS, BILITOT, PROT, ALBUMIN in the last 168 hours. No results for input(s): LIPASE, AMYLASE in the last 168 hours. No results for input(s): AMMONIA in the last 168 hours. Coagulation Profile: No results for input(s): INR, PROTIME in the last 168 hours. Cardiac Enzymes: No results for input(s): CKTOTAL, CKMB, CKMBINDEX, TROPONINI in the last 168 hours. BNP (last 3 results) No results for input(s): PROBNP in the last 8760 hours. HbA1C: No results for input(s): HGBA1C in the last 72 hours. CBG: No results for input(s): GLUCAP in the last 168 hours. Lipid Profile: No results for input(s): CHOL, HDL, LDLCALC, TRIG, CHOLHDL, LDLDIRECT in the last 72 hours. Thyroid Function Tests: No results for input(s): TSH, T4TOTAL, FREET4, T3FREE, THYROIDAB in the last 72 hours. Anemia Panel: No results for input(s): VITAMINB12, FOLATE, FERRITIN, TIBC, IRON, RETICCTPCT in the last 72 hours. Urine analysis:    Component Value Date/Time   COLORURINE YELLOW 11/14/2019 1456   APPEARANCEUR CLEAR  11/14/2019 1456   LABSPEC 1.011 11/14/2019 1456   PHURINE 6.0 11/14/2019 1456   GLUCOSEU NEGATIVE 11/14/2019 1456   HGBUR MODERATE (A) 11/14/2019 1456   BILIRUBINUR NEGATIVE 11/14/2019 1456   KETONESUR NEGATIVE 11/14/2019 1456   PROTEINUR NEGATIVE 11/14/2019 1456   UROBILINOGEN 0.2 06/01/2012 1340   NITRITE NEGATIVE 11/14/2019 1456   LEUKOCYTESUR TRACE (A) 11/14/2019 1456    Radiological Exams on Admission: DG Chest Port 1 View  Result Date: 02/17/2020 CLINICAL DATA:  Shortness of breath. EXAM: PORTABLE CHEST 1 VIEW COMPARISON:  June 01, 2012. FINDINGS: There is a right lower lobe airspace opacity concerning for pneumonia. There is a linear opacity at the left lung base suggestive of atelectasis. Underlying emphysematous changes are suspected. The heart size is normal. There is prominence of the right hilum which may be in part due to patient rotation. No pneumothorax. There are trace bilateral pleural effusions. There is no acute osseous abnormality. IMPRESSION: 1. Findings concerning for right lower lobe pneumonia. 2. Trace bilateral pleural effusions. 3. Probable atelectasis at the left lung base. 4. Prominence of the right hilum may be in part due to patient rotation. A short interval repeat two-view chest x-ray is recommended. Electronically Signed   By: Constance Holster M.D.   On: 02/17/2020 00:55    EKG: Independently reviewed.  Assessment/Plan Principal Problem:   Community acquired pneumonia of right lower lobe of lung Active Problems:   Multiple system atrophy, 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HCC)   Mild intermittent asthma with (acute) exacerbation   Acute respiratory failure with hypoxia (HCC)    1. CAP of RLL with acute hypoxic resp failure - 1. PNA pathway 2. Rocephin / azithro 3. Cultures pending 4. Trying to wean BIPAP off now 5. Tele monitor 2. Asthma exacerbation - 1. Set off by PNA 2. Prednisone daily 3. Adult wheeze protocol 4. Cont home  nebs 3. Parkinson's 1. Cont sinemet 4. Elevated troponin - 1. Suspect demand ischemia 2. No CP 3. Breathing much better since arrival (now trailing off  of BIPAP). 4. Will trend trops to see where this goes.  DVT prophylaxis: Lovenox Code Status: Full Family Communication: Family at bedside Disposition Plan: Home after admit Consults called: None Admission status: Admit to inpatient  Severity of Illness: The appropriate patient status for this patient is INPATIENT. Inpatient status is judged to be reasonable and necessary in order to provide the required intensity of service to ensure the patient's safety. The patient's presenting symptoms, physical exam findings, and initial radiographic and laboratory data in the context of their chronic comorbidities is felt to place them at high risk for further clinical deterioration. Furthermore, it is not anticipated that the patient will be medically stable for discharge from the hospital within 2 midnights of admission. The following factors support the patient status of inpatient.   IP status due to PNA with resp failure requiring BIPAP.   * I certify that at the point of admission it is my clinical judgment that the patient will require inpatient hospital care spanning beyond 2 midnights from the point of admission due to high intensity of service, high risk for further deterioration and high frequency of surveillance required.*    Blair Lundeen M. DO [facility]  How to contact the [facility] Attending or Consulting provider [place] or covering provider during after hours [place], for this patient?  1. Check the care team in [facility] and look for a) attending/consulting [facility] provider listed and b) the [facility] team listed 2. Log into [email or web address]  Amion Physician Scheduling and messaging for groups and whole hospitals  On call and physician scheduling software for group practices, residents, hospitalists and other medical providers for call, clinic,  rotation and shift schedules. On[employer] is a hospital-wide system for scheduling doctors and paging doctors on call. EasyPlot is for scientific plotting and data analysis.  [email or web address]  and use [place] [facility]'s universal password to access. If you do not have the password, please contact the hospital operator.  3. Locate the [facility] provider you are looking for under [facility] and page to a number that you can be directly reached. 4. If you still have difficulty reaching the provider, please page the [facility] (Director on Call) for the Hospitalists listed on amion for assistance.  02/17/2020, 3:59 AM

## 2020-02-17 NOTE — ED Notes (Signed)
Pt spouse at bedside

## 2020-02-17 NOTE — Consult Note (Signed)
CARDIOLOGY CONSULT NOTE  Patient ID: Cosette Prindle MRN: [id number] DOB/AGE: 1951/04/30 69 y.o.  Admit date: 02/17/2020 Referring Physician: Rodena Goldmann, DO Primary Physician:  Marton Redwood, MD Reason for Consultation: New onset of cardiomyopathy and congestive heart failure  HPI:  Jillienne Egner is a 69 y.o. female who presents with a chief complaint of " shortness of breath."  Her past medical history and cardiovascular risk factors include: Multiple system atrophy, asthma, history of left lower extremity DVT, hypertension, hyperlipidemia, hypothyroidism, left bundle branch block, advanced age, postmenopausal female.  At the time of evaluation patient is accompanied by her husband at bedside.  Cardiology has been consulted for evaluation and management newly discovered cardiomyopathy and congestive heart failure.  Patient states that she was at home yesterday when she had sudden onset of shortness of breath.  She states that she has been feeling well for last couple days and has been quite immobile.  Yesterday they have sat down to have dinner and she was started having symptoms of shortness of breath they thought it was secondary to a asthma exacerbation but no symptom relief with breathing treatments.  EMS was called and patient was transferred to ER for further evaluation and management.  Per EMR patient was placed on a BiPAP and now has been transitioned to nasal cannula oxygen.  Of note, patient did not require any oxygen supplement at home.  Patient has no prior diagnosis of congestive heart failure established coronary disease.  Patient states that she does have lower extremity swelling if he stands for prolonged period of time.  However no orthopnea or paroxysmal nocturnal dyspnea.  Over the last year patient states that when she wakes up in the middle the night she usually ends up sleeping in a recliner becomes more comfortable.  Patient denies any active chest pain.  Patient states  that she was having chest discomfort prior to admission only when she was coughing.  She is unable to comment on effort related chest pain.   Patient also has history of multiple miscarriages.  Patient and her husband try to conceive a child for 15 years but were unable to do so and also has a history of left lower extremity DVT.  Patient states that she was placed on blood thinner for 3 to 6 months and no recurrence of episodes.  Since admission patient has been treated for right lower lobe pneumonia consistent with community-acquired pneumonia.  Echocardiogram notes moderately reduced left ventricular systolic function and grade 2 diastolic impairment.  EKG on admission shows sinus tachycardia with underlying left bundle branch block which appears to be old compared to prior EKG.  Patient does have a rise and fall in troponin with a peak high sensitive troponin at 84.  And BNP of 484.  No family history of premature coronary artery disease or sudden cardiac death.  Denies prior history of coronary artery disease, myocardial infarction, congestive heart failure, pulmonary embolism, stroke, transient ischemic attack.  ALLERGIES: Allergies  Allergen Reactions  . Fosamax [Alendronate] Other (See Comments)    headache  . Symbicort [Budesonide-Formoterol Fumarate] Other (See Comments)    Shakes and muscle weakness    PAST MEDICAL HISTORY: Past Medical History:  Diagnosis Date  . Allergic rhinitis   . Anemia   . Arthritis   . Asthma   . Blood clot in vein  age 14   left leg  . Depression   . GERD (gastroesophageal reflux disease)   . Hyperlipidemia   . Hypertension  pt denies, in physician record  . Hypothyroidism   . Kidney stones   . Multiple pregnancy loss, not currently pregnant    multiple miscarriages-pt.unsure of #  . Multiple system atrophy ([place]) 2015  . Nephrolithiasis    hx of kidney stones, last 6 months ago  . Osteopenia   . Osteoporosis   . Parkinsonism ([place])   .  Tendinitis    left foot  . Vitamin D deficiency     PAST SURGICAL HISTORY: Past Surgical History:  Procedure Laterality Date  . BREAST EXCISIONAL BIOPSY Right   . BREAST SURGERY     rt. lumpectomy, benign  . colonscopy  2011  . cystoscopy  2012  . JOINT REPLACEMENT  05-2007   right knee replacment/revision  . KIDNEY STONE SURGERY    . REPLACEMENT TOTAL KNEE Left 06/05/12  . TOTAL KNEE REVISION  06/03/2012   Procedure: TOTAL KNEE REVISION;  Surgeon: Gearlean Alf, MD;  Location: [facility];  Service: Orthopedics;  Laterality: Left;  Revision of a Left Uni Knee to a Total Knee Arthroplasty    FAMILY HISTORY: The patient family history includes Asthma in her sister; Breast cancer in her sister; Diabetes in her mother; Heart attack in her brother; Heart failure in her father, mother, and sister; Thyroid disease in her mother.   SOCIAL HISTORY:  The patient  reports that she has never smoked. She has never used smokeless tobacco. She reports that she does not drink alcohol and does not use drugs.  MEDICATIONS:  Current Outpatient Medications  Medication Instructions  . albuterol (PROVENTIL) 2.5 mg, Nebulization, Every 6 hours PRN  . amantadine (SYMMETREL) 100 MG capsule TAKE 1 CAPSULE BY MOUTH THREE TIMES DAILY  . BREO ELLIPTA 100-25 MCG/INH AEPB 1 puff, Inhalation, Daily  . Carbidopa-Levodopa ER (SINEMET CR) 25-100 MG tablet controlled release TAKE 1 TABLET BY MOUTH SIX TIMES DAILY  . citalopram (CELEXA) 20 mg, Oral, Daily  . fluticasone (FLONASE) 50 MCG/ACT nasal spray 1 spray, Each Nare, Daily  . furosemide (LASIX) 20 mg, Oral, Daily  . levothyroxine (SYNTHROID) 112 mcg, Oral, Daily  . Linzess 290 mcg, Oral, Daily  . montelukast (SINGULAIR) 10 mg, Oral, Daily at bedtime  . Multiple Vitamin (MULTIVITAMIN WITH MINERALS) TABS tablet 2 tablets, Oral, Daily  . ondansetron (ZOFRAN ODT) 4 mg, Oral, Every 8 hours PRN  . pantoprazole (PROTONIX) 40 mg, Oral, Daily at bedtime  . tamsulosin  (FLOMAX) 0.4 mg, Oral, Daily  . VITAMIN D PO 2 drops, Oral, Daily  . vitamin E 200 Units, Oral, Daily  . VOLTAREN 1 % GEL 1 application, Topical, 4 times daily PRN    Review of Systems  Constitutional: Negative for chills and fever.  HENT: Negative for hoarse voice and nosebleeds.   Eyes: Negative for discharge, double vision and pain.  Cardiovascular: Positive for leg swelling. Negative for chest pain, claudication, dyspnea on exertion, near-syncope, orthopnea, palpitations, paroxysmal nocturnal dyspnea and syncope.  Respiratory: Positive for cough and shortness of breath. Negative for hemoptysis.   Musculoskeletal: Negative for muscle cramps and myalgias.  Gastrointestinal: Negative for abdominal pain, constipation, diarrhea, hematemesis, hematochezia, melena, nausea and vomiting.  Neurological: Negative for dizziness and light-headedness.  All other systems reviewed and are negative.  PHYSICAL EXAM: Vitals with BMI 02/17/2020 02/17/2020 02/17/2020  Height - - -  Weight - - -  BMI - - -  Systolic 809 983 382  Diastolic 59 69 62  Pulse 77 83 80    Intake/Output Summary (Last  24 hours) at 02/17/2020 2221 Last data filed at 02/17/2020 0346 Gross per 24 hour  Intake 350 ml  Output --  Net 350 ml    Net IO Since Admission: 350 mL [02/17/20 2221] CONSTITUTIONAL: Frail-appearing elderly female, hemodynamically stable, No acute distress.  SKIN: Skin is warm and dry. No rash noted. No cyanosis. No pallor. No jaundice HEAD: Normocephalic and atraumatic.  EYES: No scleral icterus MOUTH/THROAT: Moist oral membranes.  NECK: JVD present. No thyromegaly noted. No carotid bruits  LYMPHATIC: No visible cervical adenopathy.  CHEST: Poor respiratory effort. No intercostal retractions.  Coughing with deep inspiration. LUNGS: Decreased breath sounds bilaterally.  No stridor. No wheezes. No rales.  CARDIOVASCULAR: Regular, positive S1-S2, no gallops rubs or murmurs appreciated. ABDOMINAL: Soft,  nontender, nondistended, no apparent ascites.  EXTREMITIES: No peripheral edema.  Warm to touch bilaterally.  2+ bilateral posterior tibial pulses. HEMATOLOGIC: No significant bruising NEUROLOGIC: Oriented to person, place, and time. Nonfocal. Normal muscle tone.  PSYCHIATRIC: Normal mood and affect. Normal behavior. Cooperative  RADIOLOGY: DG Chest Port 1 View  Result Date: 02/17/2020 CLINICAL DATA:  Shortness of breath. EXAM: PORTABLE CHEST 1 VIEW COMPARISON:  June 01, 2012. FINDINGS: There is a right lower lobe airspace opacity concerning for pneumonia. There is a linear opacity at the left lung base suggestive of atelectasis. Underlying emphysematous changes are suspected. The heart size is normal. There is prominence of the right hilum which may be in part due to patient rotation. No pneumothorax. There are trace bilateral pleural effusions. There is no acute osseous abnormality. IMPRESSION: 1. Findings concerning for right lower lobe pneumonia. 2. Trace bilateral pleural effusions. 3. Probable atelectasis at the left lung base. 4. Prominence of the right hilum may be in part due to patient rotation. A short interval repeat two-view chest x-ray is recommended. Electronically Signed   By: Constance Holster M.D.   On: 02/17/2020 00:55   ECHOCARDIOGRAM COMPLETE  Result Date: 02/17/2020    ECHOCARDIOGRAM REPORT   Patient Name:   [facility] Date of Exam: 02/17/2020 Medical Rec #:  [id number]     Height:       66.0 in Accession #:    [id number]    Weight:       178.8 lb Date of Birth:  07/20/1951      BSA:          1.907 m Patient Age:    40 years      BP:           128/93 mmHg Patient Gender: F             HR:           87 bpm. Exam Location:  Inpatient Procedure: 2D Echo, Cardiac Doppler and Color Doppler REPORT CONTAINS CRITICAL RESULT Indications:    R07.9* Chest pain, unspecified. Elevated troponin  History:        Patient has no prior history of Echocardiogram examinations.                  Signs/Symptoms:Chest Pain, Dyspnea and Shortness of Breath.                 Respiratory failure. Elevated troponin.  Sonographer:    Roseanna Rainbow RDCS Referring Phys: [id number] [facility]  1. Left ventricular ejection fraction, by estimation, is 30 to 35%. The left ventricle has moderately decreased function. The left ventricle demonstrates global hypokinesis. Abnormal septal motion consistent with left bundle branch block.The left ventricular  internal cavity size was mildly dilated. There is mild left ventricular hypertrophy. Left ventricular diastolic parameters are indeterminate.  2. Right ventricular systolic function is normal. The right ventricular size is normal. Tricuspid regurgitation signal is inadequate for assessing PA pressure.  3. The mitral valve is normal in structure. Mild mitral valve regurgitation.  4. The aortic valve has an indeterminant number of cusps. Aortic valve regurgitation is not visualized. Mild aortic valve sclerosis is present, with no evidence of aortic valve stenosis.  5. The inferior vena cava is dilated in size with <50% respiratory variability, suggesting right atrial pressure of 15 mmHg. FINDINGS  Left Ventricle: Left ventricular ejection fraction, by estimation, is 30 to 35%. The left ventricle has moderately decreased function. The left ventricle demonstrates global hypokinesis. The left ventricular internal cavity size was mildly dilated. There is mild left ventricular hypertrophy. There is severe asymmetric left ventricular Left ventricular diastolic parameters are indeterminate. Right Ventricle: The right ventricular size is normal. Right vetricular wall thickness was not assessed. Right ventricular systolic function is normal. Tricuspid regurgitation signal is inadequate for assessing PA pressure. Left Atrium: Left atrial size was normal in size. Right Atrium: Right atrial size was normal in size. Pericardium: There is no evidence of pericardial effusion. Mitral  Valve: The mitral valve is normal in structure. Mild mitral valve regurgitation. MV peak gradient, 8.8 mmHg. The mean mitral valve gradient is 2.0 mmHg. Tricuspid Valve: The tricuspid valve is normal in structure. Tricuspid valve regurgitation is trivial. Aortic Valve: The aortic valve has an indeterminant number of cusps. Aortic valve regurgitation is not visualized. Mild aortic valve sclerosis is present, with no evidence of aortic valve stenosis. Pulmonic Valve: The pulmonic valve was grossly normal. Pulmonic valve regurgitation is not visualized. Aorta: The aortic root and ascending aorta are structurally normal, with no evidence of dilitation. Venous: The inferior vena cava is dilated in size with less than 50% respiratory variability, suggesting right atrial pressure of 15 mmHg. IAS/Shunts: The interatrial septum was not well visualized.  LEFT VENTRICLE PLAX 2D LVIDd:         4.60 cm      Diastology LVIDs:         4.10 cm      LV e' lateral:   5.66 cm/s LV PW:         1.30 cm      LV E/e' lateral: 21.7 LV IVS:        1.20 cm      LV e' medial:    5.11 cm/s LVOT diam:     1.80 cm      LV E/e' medial:  24.1 LV SV:         66 LV SV Index:   35 LVOT Area:     2.54 cm  LV Volumes (MOD) LV vol d, MOD A2C: 126.0 ml LV vol d, MOD A4C: 113.0 ml LV vol s, MOD A2C: 86.6 ml LV vol s, MOD A4C: 86.6 ml LV SV MOD A2C:     39.4 ml LV SV MOD A4C:     113.0 ml LV SV MOD BP:      34.3 ml RIGHT VENTRICLE             IVC RV S prime:     12.10 cm/s  IVC diam: 2.80 cm TAPSE (M-mode): 2.5 cm LEFT ATRIUM             Index       RIGHT ATRIUM  Index LA diam:        3.60 cm 1.89 cm/m  RA Area:     12.90 cm LA Vol (A2C):   46.0 ml 24.12 ml/m RA Volume:   29.90 ml  15.68 ml/m LA Vol (A4C):   50.8 ml 26.64 ml/m LA Biplane Vol: 50.4 ml 26.43 ml/m  AORTIC VALVE             PULMONIC VALVE LVOT Vmax:   135.00 cm/s PR End Diast Vel: 1.75 msec LVOT Vmean:  88.400 cm/s LVOT VTI:    0.260 m  AORTA Ao Root diam: 2.90 cm Ao Asc diam:   3.00 cm MITRAL VALVE MV Area (PHT): 6.54 cm      SHUNTS MV Peak grad:  8.8 mmHg      Systemic VTI:  0.26 m MV Mean grad:  2.0 mmHg      Systemic Diam: 1.80 cm MV Vmax:       1.48 m/s MV Vmean:      62.9 cm/s MV Decel Time: 116 msec MR Peak grad:    91.0 mmHg MR Mean grad:    61.0 mmHg MR Vmax:         477.00 cm/s MR Vmean:        366.0 cm/s MR PISA:         2.26 cm MR PISA Eff ROA: 15 mm MR PISA Radius:  0.60 cm MV E velocity: 123.00 cm/s Oswaldo Milian MD Electronically signed by Oswaldo Milian MD Signature Date/Time: 02/17/2020/3:35:22 PM    Final     LABORATORY DATA: Lab Results  Component Value Date   WBC 7.6 02/17/2020   HGB 12.6 02/17/2020   HCT 37.0 02/17/2020   MCV 93.2 02/17/2020   PLT 311 02/17/2020    Recent Labs  Lab 02/17/20 0021 02/17/20 0021 02/17/20 0256  NA 141   < > 143  K 3.5   < > 3.3*  CL 103  --   --   CO2 27  --   --   BUN 13  --   --   CREATININE 1.05*  --   --   CALCIUM 9.4  --   --   GLUCOSE 151*  --   --    < > = values in this interval not displayed.    Lipid Panel  No results found for: CHOL, TRIG, HDL, CHOLHDL, VLDL, LDLCALC  BNP (last 3 results) Recent Labs    02/17/20 0021  BNP 484.2*    HEMOGLOBIN A1C No results found for: HGBA1C, MPG  Cardiac Panel (last 3 results) Recent Labs    02/17/20 0439 02/17/20 0753 02/17/20 0952  TROPONINIHS 78* 84* 72*   TSH No results for input(s): TSH in the last 8760 hours.   Scheduled Meds: . amantadine  100 mg Oral TID  . Carbidopa-Levodopa ER  1 tablet Oral 6 X Daily  . citalopram  20 mg Oral Daily  . doxycycline  100 mg Oral Q12H  . enoxaparin (LOVENOX) injection  40 mg Subcutaneous Q24H  . fluticasone  1 spray Each Nare Daily  . fluticasone furoate-vilanterol  1 puff Inhalation Daily  . furosemide  20 mg Oral Daily  . levothyroxine  112 mcg Oral Daily  . linaclotide  290 mcg Oral Daily  . metoprolol tartrate  12.5 mg Oral BID  . mometasone-formoterol  2 puff Inhalation  BID  . mometasone-formoterol  2 puff Inhalation BID  . montelukast  10 mg Oral QHS  . multivitamin  with minerals  2 tablet Oral Daily  . pantoprazole  40 mg Oral Daily  . predniSONE  40 mg Oral Q breakfast  . tamsulosin  0.4 mg Oral Daily   Continuous Infusions: . [START ON 02/18/2020] cefTRIAXone (ROCEPHIN)  IV     PRN Meds:.acetaminophen, albuterol  CARDIAC DATABASE: EKG: 02/17/2020 @ 00:16: Sinus tachycardia, 112 bpm, left bundle branch block, ST-T changes most likely secondary to LBBB, poor R wave progression, no significant change compared to prior ECG.  Echocardiogram: 02/17/2020: LVEF 30-35%, moderately decreased LVEF, global hypokinesis, abnormal septal motion consistent with LBBB, LV size mildly dilated, mild LVH, grade 2 diastolic impairment, mild MR, aortic valve sclerosis without stenosis.  Stress Testing:  2013, per patient but results available to review.   Heart Catheterization: None  IMPRESSION & RECOMMENDATIONS: Laylamarie Meuser is a 69 y.o. female whose past medical history and cardiovascular risk factors include: Multiple system atrophy, asthma, history of left lower extremity DVT, hypertension, hyperlipidemia, hypothyroidism, left bundle branch block, advanced age, postmenopausal female.  Acute hypoxic respiratory failure: Multifactorial  Patient presented to the hospital with acute hypoxia and respiratory distress requiring BiPAP intervention and now currently requiring nasal cannula oxygen.  Currently the known contributing factors include community-acquired pneumonia, newly discovered cardiomyopathy, and acute systolic/diastolic heart failure.  Given the acuity of the symptoms, being immobile for couple days prior to presentation, requiring BiPAP intervention on presentation, still requiring oxygen supplementation, and tachycardia with prior history of left lower extremity DVT and multiple spontaneous miscarriages cannot rule out underlying hypercoagulable state (i.e.  pulmonary embolism).  We will check D-dimer, if elevated will order CT PE protocol to rule out pulmonary embolism.  Newly diagnosed combined systolic and diastolic heart failure, stage C, NYHA class II/III:  Clinically patient is not in overt heart failure.  However, patient does have elevated BNP and JVP on physical examination would benefit from diuresis.  We will initiate gentle IV diuresis for now as she may be exposed to contrast in the next 24 to 48 hours depending on the D-dimer results.  Patient's heart failure medications will be uptitrated based on kidney function with intentions of initiating Entresto and spironolactone,  prior to discharge.  Strict I's and O's and daily weights.  Echocardiogram results independently reviewed.  Newly discovered cardiomyopathy:  Etiology unknown at this time.  We will focus on uptitrating guideline, and directed medical therapy.  Patient will require an ischemic evaluation whether it be done before discharge or as outpatient is dependent on how she clinically progresses during the hospitalization.  Elevated troponin most likely secondary to type II MI from supply demand ischemia in the setting of underlying cardiomyopathy.    Peak troponin 84, trending downward.    Patient is asymptomatic in regards to angina pectoris.    We discussed undergoing an ischemic evaluation either prior to discharge or as outpatient.  Further recommendations to follow  Sinus tachycardia: Improving, check TSH.  Continue telemetry  Shortness of breath, on admission: Improving.  See above  Other: Benign essential hypertension: Management as per your care Hyperlipidemia: Recommend checking fasting lipid profile.  Currently not on statin therapy as outpatient. Left bundle branch block: Continue to monitor. History of left lower extremity DVT. Hypothyroidism: Check TSH.  Continue management as per your care Community acquired pneumonia, right lower lobe:  Management as per your care  Patient's questions and concerns were addressed to her satisfaction. She voices understanding of the instructions provided during this encounter.   This note was created using  a voice recognition software as a result there may be grammatical errors inadvertently enclosed that do not reflect the nature of this encounter. Every attempt is made to correct such errors.  Rex Kras, DO, [facility]. [place] Office: [phone] 02/17/2020, 10:21 PM

## 2020-02-17 NOTE — Progress Notes (Signed)
  Echocardiogram 2D Echocardiogram has been performed.  Rachel [name] 02/17/2020, 11:32 AM

## 2020-02-18 DIAGNOSIS — Z6834 Body mass index (BMI) 34.0-34.9, adult: Secondary | ICD-10-CM

## 2020-02-18 DIAGNOSIS — E6609 Other obesity due to excess calories: Secondary | ICD-10-CM

## 2020-02-18 LAB — RETICULOCYTES
Immature Retic Fract: 9.8 % (ref 2.3–15.9)
RBC.: 4.01 MIL/uL (ref 3.87–5.11)
Retic Count, Absolute: 61 10*3/uL (ref 19.0–186.0)
Retic Ct Pct: 1.5 % (ref 0.4–3.1)

## 2020-02-18 LAB — BASIC METABOLIC PANEL
Anion gap: 8 (ref 5–15)
BUN: 16 mg/dL (ref 8–23)
CO2: 29 mmol/L (ref 22–32)
Calcium: 9.1 mg/dL (ref 8.9–10.3)
Chloride: 102 mmol/L (ref 98–111)
Creatinine, Ser: 0.94 mg/dL (ref 0.44–1.00)
GFR calc Af Amer: >60 mL/min (ref >60)
GFR calc non Af Amer: >60 mL/min (ref >60)
Glucose, Bld: 105 mg/dL — ABNORMAL HIGH (ref 70–99)
Potassium: 3.5 mmol/L (ref 3.5–5.1)
Sodium: 139 mmol/L (ref 135–145)

## 2020-02-18 LAB — LIPID PANEL
Cholesterol: 236 mg/dL — ABNORMAL HIGH (ref 0–200)
HDL: 65 mg/dL (ref >40)
LDL Cholesterol: 162 mg/dL — ABNORMAL HIGH (ref 0–99)
Total CHOL/HDL Ratio: 3.6 RATIO
Triglycerides: 47 mg/dL (ref <150)
VLDL: 9 mg/dL (ref 0–40)

## 2020-02-18 LAB — TSH: TSH: 1.862 u[IU]/mL (ref 0.350–4.500)

## 2020-02-18 LAB — CBC
HCT: 33.6 % — ABNORMAL LOW (ref 36.0–46.0)
Hemoglobin: 10.5 g/dL — ABNORMAL LOW (ref 12.0–15.0)
MCH: 28.6 pg (ref 26.0–34.0)
MCHC: 31.3 g/dL (ref 30.0–36.0)
MCV: 91.6 fL (ref 80.0–100.0)
Platelets: 245 10*3/uL (ref 150–400)
RBC: 3.67 MIL/uL — ABNORMAL LOW (ref 3.87–5.11)
RDW: 13.7 % (ref 11.5–15.5)
WBC: 7.7 10*3/uL (ref 4.0–10.5)
nRBC: 0 % (ref 0.0–0.2)

## 2020-02-18 LAB — FOLATE: Folate: 19.7 ng/mL (ref >5.9)

## 2020-02-18 LAB — MAGNESIUM: Magnesium: 2 mg/dL (ref 1.7–2.4)

## 2020-02-18 LAB — IRON AND TIBC
Iron: 34 ug/dL (ref 28–170)
Saturation Ratios: 9 % — ABNORMAL LOW (ref 10.4–31.8)
TIBC: 385 ug/dL (ref 250–450)
UIBC: 351 ug/dL

## 2020-02-18 LAB — VITAMIN B12: Vitamin B-12: 194 pg/mL (ref 180–914)

## 2020-02-18 LAB — PROCALCITONIN: Procalcitonin: 0.12 ng/mL

## 2020-02-18 LAB — FERRITIN: Ferritin: 42 ng/mL (ref 11–307)

## 2020-02-18 MED ORDER — ENSURE ENLIVE PO LIQD
237.0000 mL | Freq: Two times a day (BID) | ORAL | Status: DC
  Administered 2020-02-18 (×2): 237 mL via ORAL

## 2020-02-18 MED ORDER — PRAVASTATIN SODIUM 40 MG PO TABS
40.0000 mg | ORAL_TABLET | Freq: Every day | ORAL | Status: DC
  Administered 2020-02-18 – 2020-02-19 (×2): 40 mg via ORAL
  Filled 2020-02-18 (×2): qty 1

## 2020-02-18 MED ORDER — SACUBITRIL-VALSARTAN 24-26 MG PO TABS
1.0000 | ORAL_TABLET | Freq: Two times a day (BID) | ORAL | Status: DC
  Administered 2020-02-18 – 2020-02-21 (×7): 1 via ORAL
  Filled 2020-02-18 (×7): qty 1

## 2020-02-18 NOTE — Progress Notes (Signed)
Heart Failure Stewardship Pharmacist Progress Note   PCP: Marton Redwood, MD PCP-Cardiologist: No primary care provider on file.   HPI:  69 yo F with PMH significant for HTN, HLD, hypothyroidism, LBBB, asthma, history of LE DVT. She presented to [facility] ED on 02/17/20 complaining of shortness of breath. CXR reveals RLL PNA and patient was started on azithromycin and ceftriaxone. Antibiotics were stopped today given low suspicion for infection and concern that respiratory failure was related to acute systolic and diastolic congestive heart failure. An ECHO was done on 02/17/20 and she was found to have new low LVEF of 30-35% with normal RV function. She has received IV lasix (I/O's not documented) and admission weight is 198 lbs.  Current HF Medications: Furosemide 20 mg IV daily Metoprolol tartrate 12.5 mg BID Entresto 24/26 mg BID  Prior to admission HF Medications: Furosemide 20 mg daily  Pertinent Lab Values:  Serum creatinine 0.94, BUN 16, Potassium 3.5, Sodium 139, BNP 484.2, Magnesium 2   Vital Signs:  Weight: 198 lbs (admission weight: 198 lbs)  Blood pressure: 130/60s   Heart rate: 60-70s   Medication Assistance / Insurance Benefits Check: Does the patient have prescription insurance?   yes Type of insurance plan: BCBS Medicare  Does the patient qualify for medication assistance through manufacturers or grants?   Pending household income information  Eligible grants and/or patient assistance programs: pending  Medication assistance applications in progress: none  Medication assistance applications approved: none  Approved medication assistance renewals will be completed by: pending  Outpatient Pharmacy:  Prior to admission outpatient pharmacy: [facility] Is the patient willing to use [facility] at discharge?   pending Is the patient willing to transition their outpatient pharmacy to utilize a [facility] outpatient pharmacy?   pending    Assessment: 1. Acute  systolic CHF (EF 98-33%), due to unknown etiology. NYHA class II/III symptoms. Still with JVD on physical exam. Receiving IV furosemide. No documented I/O's. - Continue furosemide IV 20 mg daily - Continue metoprolol tartrate 12.5 mg BID. Consider transitioning to metoprolol succinate prior to discharge. - Agree with starting Entresto 24/26 mg BID - Consider starting spironolactone prior to discharge - Consider starting SGLT2i prior to discharge   Plan: 1) Medication changes recommended at this time: - None - agree with starting Entresto today  2) Patient assistance application(s): - None - may be a candidate for Entresto patient assistance through [employer] pending household income information   3)  Education  - To be completed prior to discharge  Vertis Kelch, PharmD, BCPS Heart Failure Stewardship Pharmacist 02/18/2020       10:55 AM

## 2020-02-18 NOTE — Progress Notes (Signed)
Progress Note  Patient Name: Rachel [name] Date of Encounter: 02/18/2020  Attending physician: Rodena Goldmann, DO Primary care provider: Marton Redwood, MD Primary Cardiologist: NA Consultant:Dequarius Jeffries Terri Skains DO, [facility]  Subjective: Rachel [name] is a 68 y.o. female who was seen and examined at bedside at approximately 9 AM.  Patient states that her shortness of breath has improved since admission.  She denies chest pain at rest.  Objective: Vital Signs in Rachel last 24 hours: Temp:  [98 F (36.7 C)-98.1 F (36.7 C)] 98 F (36.7 C) (07/23 0826) Pulse Rate:  [77-85] 77 (07/23 0826) Resp:  [14-19] 17 (07/23 0826) BP: (126-144)/(56-83) 129/56 (07/23 0826) SpO2:  [96 %-100 %] 100 % (07/23 0826) Weight:  [90.2 kg] 90.2 kg (07/23 0500)  Intake/Output:  Intake/Output Summary (Last 24 hours) at 02/18/2020 0950 Last data filed at 02/18/2020 0827 Gross per 24 hour  Intake 120 ml  Output --  Net 120 ml    Net IO Since Admission: 470 mL [02/18/20 0950]  Weights:  Filed Weights   02/17/20 2326 02/18/20 0500  Weight: 90.2 kg 90.2 kg    Telemetry: Personally reviewed.  Physical examination: PHYSICAL EXAM: Vitals with BMI 02/18/2020 02/18/2020 02/18/2020  Height - - -  Weight - 198 lbs 14 oz -  BMI - 18.29 -  Systolic 937 - 169  Diastolic 56 - 73  Pulse 77 - 80    CONSTITUTIONAL: Frail-appearing elderly female, hemodynamically stable, No acute distress.  SKIN: Skin is warm and dry. No rash noted. No cyanosis. No pallor. No jaundice HEAD: Normocephalic and atraumatic.  EYES: No scleral icterus MOUTH/THROAT: Moist oral membranes.  NECK: JVD present. No thyromegaly noted. No carotid bruits  LYMPHATIC: No visible cervical adenopathy.  CHEST: Poor respiratory effort. No intercostal retractions.  Coughing with deep inspiration. LUNGS: Decreased breath sounds bilaterally.  No stridor. No wheezes. No rales.  CARDIOVASCULAR: Regular, positive S1-S2, no gallops rubs or murmurs  appreciated. ABDOMINAL: Soft, nontender, nondistended, no apparent ascites.  EXTREMITIES: No peripheral edema.  Warm to touch bilaterally.  2+ bilateral posterior tibial pulses. HEMATOLOGIC: No significant bruising NEUROLOGIC: Oriented to person, place, and time. Nonfocal. Normal muscle tone.  PSYCHIATRIC: Normal mood and affect. Normal behavior. Cooperative  Lab Results: Hematology Recent Labs  Lab 02/17/20 0021 02/17/20 0256 02/18/20 0602  WBC 7.6  --  7.7  RBC 4.28  --  3.67*  HGB 12.2 12.6 10.5*  HCT 39.9 37.0 33.6*  MCV 93.2  --  91.6  MCH 28.5  --  28.6  MCHC 30.6  --  31.3  RDW 13.6  --  13.7  PLT 311  --  245    Chemistry Recent Labs  Lab 02/17/20 0021 02/17/20 0256 02/18/20 0602  NA 141 143 139  K 3.5 3.3* 3.5  CL 103  --  102  CO2 27  --  29  GLUCOSE 151*  --  105*  BUN 13  --  16  CREATININE 1.05*  --  0.94  CALCIUM 9.4  --  9.1  GFRNONAA 54*  --  >60  GFRAA >60  --  >60  ANIONGAP 11  --  8     Cardiac Enzymes: Cardiac Panel (last 3 results) Recent Labs    02/17/20 0439 02/17/20 0753 02/17/20 0952  TROPONINIHS 78* 84* 72*    BNP (last 3 results) Recent Labs    02/17/20 0021  BNP 484.2*    ProBNP (last 3 results) No results for input(s): PROBNP in Rachel last 8760  hours.   DDimer  Recent Labs  Lab 02/17/20 1900  DDIMER 0.68*     Hemoglobin A1c: No results found for: HGBA1C, MPG  TSH  Recent Labs    02/18/20 0602  TSH 1.862    Lipid Panel     Component Value Date/Time   CHOL 236 (H) 02/18/2020 0602   TRIG 47 02/18/2020 0602   HDL 65 02/18/2020 0602   CHOLHDL 3.6 02/18/2020 0602   VLDL 9 02/18/2020 0602   LDLCALC 162 (H) 02/18/2020 0602   Imaging: CT ANGIO CHEST PE W OR WO CONTRAST  Result Date: 02/17/2020 CLINICAL DATA:  Short of breath, positive D-dimer, pneumonia EXAM: CT ANGIOGRAPHY CHEST WITH CONTRAST TECHNIQUE: Multidetector CT imaging of Rachel chest was performed using Rachel standard protocol during bolus  administration of intravenous contrast. Multiplanar CT image reconstructions and MIPs were obtained to evaluate Rachel vascular anatomy. CONTRAST:  37mL OMNIPAQUE IOHEXOL 350 MG/ML SOLN COMPARISON:  02/17/2020 FINDINGS: Cardiovascular: This is a technically adequate evaluation of Rachel pulmonary vasculature. No filling defects or pulmonary emboli. Rachel heart is unremarkable without pericardial effusion. Normal caliber of Rachel thoracic aorta. Mild atherosclerosis. Mediastinum/Nodes: No enlarged mediastinal, hilar, or axillary lymph nodes. Thyroid gland, trachea, and esophagus demonstrate no significant findings. Lungs/Pleura: Hypoventilatory changes are seen within Rachel bilateral lower lobes. Mild background emphysema. No acute airspace disease, effusion, or pneumothorax. Central airways are patent. Upper Abdomen: No acute abnormality. Musculoskeletal: No acute or destructive bony lesions. Reconstructed images demonstrate no additional findings. Review of Rachel MIP images confirms Rachel above findings. IMPRESSION: 1. No evidence of pulmonary embolus. 2. Hypoventilatory changes.  No acute airspace disease. 3. Aortic Atherosclerosis (ICD10-I70.0) and Emphysema (ICD10-J43.9). Electronically Signed   By: Randa Ngo M.D.   On: 02/17/2020 23:13   DG Chest Port 1 View  Result Date: 02/17/2020 CLINICAL DATA:  Shortness of breath. EXAM: PORTABLE CHEST 1 VIEW COMPARISON:  June 01, 2012. FINDINGS: There is a right lower lobe airspace opacity concerning for pneumonia. There is a linear opacity at Rachel left lung base suggestive of atelectasis. Underlying emphysematous changes are suspected. Rachel heart size is normal. There is prominence of Rachel right hilum which may be in part due to patient rotation. No pneumothorax. There are trace bilateral pleural effusions. There is no acute osseous abnormality. IMPRESSION: 1. Findings concerning for right lower lobe pneumonia. 2. Trace bilateral pleural effusions. 3. Probable atelectasis at Rachel  left lung base. 4. Prominence of Rachel right hilum may be in part due to patient rotation. A short interval repeat two-view chest x-ray is recommended. Electronically Signed   By: Constance Holster M.D.   On: 02/17/2020 00:55   ECHOCARDIOGRAM COMPLETE  Result Date: 02/17/2020    ECHOCARDIOGRAM REPORT   Patient Name:   Rachel [name] Date of Exam: 02/17/2020 Medical Rec #:  [id number]     Height:       66.0 in Accession #:    [id number]    Weight:       178.8 lb Date of Birth:  11/24/1950      BSA:          1.907 m Patient Age:    32 years      BP:           128/93 mmHg Patient Gender: F             HR:           87 bpm. Exam Location:  Inpatient Procedure: 2D Echo, Cardiac Doppler and Color  Doppler REPORT CONTAINS CRITICAL RESULT Indications:    R07.9* Chest pain, unspecified. Elevated troponin  History:        Patient has no prior history of Echocardiogram examinations.                 Signs/Symptoms:Chest Pain, Dyspnea and Shortness of Breath.                 Respiratory failure. Elevated troponin.  Sonographer:    Roseanna Rainbow RDCS Referring Phys: [id number] Royanne Foots [place]  1. Left ventricular ejection fraction, by estimation, is 30 to 35%. Rachel left ventricle has moderately decreased function. Rachel left ventricle demonstrates global hypokinesis. Abnormal septal motion consistent with left bundle branch block.Rachel left ventricular internal cavity size was mildly dilated. There is mild left ventricular hypertrophy. Left ventricular diastolic parameters are indeterminate.  2. Right ventricular systolic function is normal. Rachel right ventricular size is normal. Tricuspid regurgitation signal is inadequate for assessing PA pressure.  3. Rachel mitral valve is normal in structure. Mild mitral valve regurgitation.  4. Rachel aortic valve has an indeterminant number of cusps. Aortic valve regurgitation is not visualized. Mild aortic valve sclerosis is present, with no evidence of aortic valve stenosis.  5. Rachel inferior vena  cava is dilated in size with <50% respiratory variability, suggesting right atrial pressure of 15 mmHg. FINDINGS  Left Ventricle: Left ventricular ejection fraction, by estimation, is 30 to 35%. Rachel left ventricle has moderately decreased function. Rachel left ventricle demonstrates global hypokinesis. Rachel left ventricular internal cavity size was mildly dilated. There is mild left ventricular hypertrophy. There is severe asymmetric left ventricular Left ventricular diastolic parameters are indeterminate. Right Ventricle: Rachel right ventricular size is normal. Right vetricular wall thickness was not assessed. Right ventricular systolic function is normal. Tricuspid regurgitation signal is inadequate for assessing PA pressure. Left Atrium: Left atrial size was normal in size. Right Atrium: Right atrial size was normal in size. Pericardium: There is no evidence of pericardial effusion. Mitral Valve: Rachel mitral valve is normal in structure. Mild mitral valve regurgitation. MV peak gradient, 8.8 mmHg. Rachel mean mitral valve gradient is 2.0 mmHg. Tricuspid Valve: Rachel tricuspid valve is normal in structure. Tricuspid valve regurgitation is trivial. Aortic Valve: Rachel aortic valve has an indeterminant number of cusps. Aortic valve regurgitation is not visualized. Mild aortic valve sclerosis is present, with no evidence of aortic valve stenosis. Pulmonic Valve: Rachel pulmonic valve was grossly normal. Pulmonic valve regurgitation is not visualized. Aorta: Rachel aortic root and ascending aorta are structurally normal, with no evidence of dilitation. Venous: Rachel inferior vena cava is dilated in size with less than 50% respiratory variability, suggesting right atrial pressure of 15 mmHg. IAS/Shunts: Rachel interatrial septum was not well visualized.  LEFT VENTRICLE PLAX 2D LVIDd:         4.60 cm      Diastology LVIDs:         4.10 cm      LV e' lateral:   5.66 cm/s LV PW:         1.30 cm      LV E/e' lateral: 21.7 LV IVS:        1.20 cm       LV e' medial:    5.11 cm/s LVOT diam:     1.80 cm      LV E/e' medial:  24.1 LV SV:         66 LV SV Index:   35 LVOT Area:  2.54 cm  LV Volumes (MOD) LV vol d, MOD A2C: 126.0 ml LV vol d, MOD A4C: 113.0 ml LV vol s, MOD A2C: 86.6 ml LV vol s, MOD A4C: 86.6 ml LV SV MOD A2C:     39.4 ml LV SV MOD A4C:     113.0 ml LV SV MOD BP:      34.3 ml RIGHT VENTRICLE             IVC RV S prime:     12.10 cm/s  IVC diam: 2.80 cm TAPSE (M-mode): 2.5 cm LEFT ATRIUM             Index       RIGHT ATRIUM           Index LA diam:        3.60 cm 1.89 cm/m  RA Area:     12.90 cm LA Vol (A2C):   46.0 ml 24.12 ml/m RA Volume:   29.90 ml  15.68 ml/m LA Vol (A4C):   50.8 ml 26.64 ml/m LA Biplane Vol: 50.4 ml 26.43 ml/m  AORTIC VALVE             PULMONIC VALVE LVOT Vmax:   135.00 cm/s PR End Diast Vel: 1.75 msec LVOT Vmean:  88.400 cm/s LVOT VTI:    0.260 m  AORTA Ao Root diam: 2.90 cm Ao Asc diam:  3.00 cm MITRAL VALVE MV Area (PHT): 6.54 cm      SHUNTS MV Peak grad:  8.8 mmHg      Systemic VTI:  0.26 m MV Mean grad:  2.0 mmHg      Systemic Diam: 1.80 cm MV Vmax:       1.48 m/s MV Vmean:      62.9 cm/s MV Decel Time: 116 msec MR Peak grad:    91.0 mmHg MR Mean grad:    61.0 mmHg MR Vmax:         477.00 cm/s MR Vmean:        366.0 cm/s MR PISA:         2.26 cm MR PISA Eff ROA: 15 mm MR PISA Radius:  0.60 cm MV E velocity: 123.00 cm/s Oswaldo Milian MD Electronically signed by Oswaldo Milian MD Signature Date/Time: 02/17/2020/3:35:22 PM    Final     Cardiac database: EKG: 02/17/2020 @ 00:16: Sinus tachycardia, 112 bpm, left bundle branch block, ST-T changes most likely secondary to LBBB, poor R wave progression, no significant change compared to prior ECG.  Echocardiogram: 02/17/2020: LVEF 30-35%, moderately decreased LVEF, global hypokinesis, abnormal septal motion consistent with LBBB, LV size mildly dilated, mild LVH, grade 2 diastolic impairment, mild MR, aortic valve sclerosis without  stenosis.  Stress Testing:  2013, per patient but results available to review.   Heart Catheterization: None  Scheduled Meds: . amantadine  100 mg Oral TID  . Carbidopa-Levodopa ER  1 tablet Oral 6 X Daily  . citalopram  20 mg Oral Daily  . doxycycline  100 mg Oral Q12H  . enoxaparin (LOVENOX) injection  40 mg Subcutaneous Q24H  . feeding supplement (ENSURE ENLIVE)  237 mL Oral BID BM  . fluticasone  1 spray Each Nare Daily  . fluticasone furoate-vilanterol  1 puff Inhalation Daily  . furosemide  20 mg Intravenous Daily  . levothyroxine  112 mcg Oral Daily  . linaclotide  290 mcg Oral Daily  . metoprolol tartrate  12.5 mg Oral BID  . mometasone-formoterol  2 puff Inhalation BID  .  montelukast  10 mg Oral QHS  . multivitamin with minerals  2 tablet Oral Daily  . pantoprazole  40 mg Oral Daily  . predniSONE  40 mg Oral Q breakfast  . tamsulosin  0.4 mg Oral Daily    Continuous Infusions: . cefTRIAXone (ROCEPHIN)  IV      PRN Meds: acetaminophen, albuterol   IMPRESSION & RECOMMENDATIONS: Rachel [name] is a 69 y.o. female whose past medical history and cardiac risk factors include: New diagnosis of cardiomyopathy etiology unknown, acute systolic and diastolic heart failure/stage C/NYHA class II/III, multiple system atrophy, asthma, history of left lower extremity DVT, hypertension, hyperlipidemia, hypothyroidism, left bundle branch block, advanced age, postmenopausal female.  Acute hypoxic respiratory failure: Multifactorial  Patient presented to [name] with acute hypoxia and respiratory distress requiring BiPAP intervention and now currently requiring nasal cannula oxygen.    Secondary to community-acquired pneumonia, newly discovered cardiomyopathy, and acute systolic/diastolic heart failure.    D-dimer positive, subsequently underwent CT angio PE protocol negative for pulmonary embolism  Newly diagnosed combined systolic and diastolic heart failure, stage C,  NYHA class II/III:  Clinically patient is not in overt heart failure.  However, patient does have elevated BNP and JVP on physical examination would benefit from diuresis.  Continue Lasix 20 mg IV push daily.    Start Entresto 24/26 mg p.o. twice daily.    We will continue to uptitrate guideline directed medical therapy as hemodynamics and laboratory values allow.    Recommend strict I's and O's and daily weights.  Echocardiogram results independently reviewed.  Newly discovered cardiomyopathy: See above, etiology unknown.  Elevated troponin most likely secondary to type II MI from supply demand ischemia in Rachel setting of underlying cardiomyopathy.    Patient did have a rise and fall in troponin with peak troponin 84, trending downward.    Patient is asymptomatic in regards to angina pectoris.    We discussed undergoing left heart catheterization prior to discharge as Rachel patient has multiple cardiovascular risk factors including untreated hyperlipidemia, hypertension, left bundle branch block, advanced age, postmenopausal female, moderately reduced LVEF.  Patient would like to think about this prior to scheduling this.  Further recommendations to follow.  Sinus tachycardia: Resolved, TSH within normal limits.  Continue telemetry  Shortness of breath, on admission: Improving.  See above  Other: Benign essential hypertension: Management as per your care Hyperlipidemia: In Rachel past patient has not tolerated statin therapy well.  However, patient is willing to be on statin therapy.  We will start pravastatin 40 mg p.o. nightly. Left bundle branch block: Continue to monitor. History of left lower extremity DVT. Hypothyroidism: Continue management as per your care Community acquired pneumonia, right lower lobe: Management as per your care  Patient's questions and concerns were addressed to her satisfaction. She voices understanding of Rachel instructions provided during this  encounter.   This note was created using a voice recognition software as a result there may be grammatical errors inadvertently enclosed that do not reflect Rachel nature of this encounter. Every attempt is made to correct such errors.  Rex Kras, DO, [facility]. [place] Office: [phone] 02/18/2020, 9:50 AM

## 2020-02-18 NOTE — Progress Notes (Signed)
PROGRESS NOTE    Rachel [name]  WCB:[id number] DOB: 03/20/1951 DOA: 02/17/2020 PCP: Marton Redwood, MD   Brief Narrative:   This is a 69 year old female with history of multiple system atrophy/parkinsonism as well as asthma.  She was initially diagnosed with community-acquired pneumonia of the right lower lobe of her lung, but she was not noted to have any significant leukocytosis, fevers, or chills.  She was started on antibiotic treatment with Rocephin and azithromycin which was then changed over to doxycycline.  She was noted to have significant cardiomyopathy with LV dysfunction and EF 30-35% on 2D echocardiogram yesterday.  EKG with left bundle branch block.  She denied any chest pain and troponin trend had remained flat.  Cardiology evaluation performed and patient was started on some gentle IV diuresis with Lasix.  Assessment & Plan:   Principal Problem:   Community acquired pneumonia of right lower lobe of lung Active Problems:   Multiple system atrophy, 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HCC)   Mild intermittent asthma with (acute) exacerbation   Acute respiratory failure with hypoxia (HCC)   Acute combined systolic and diastolic heart failure (HCC)   Class 1 obesity due to excess calories with serious comorbidity and body mass index (BMI) of 34.0 to 34.9 in adult   Acute hypoxemic respiratory failure likely secondary to newly diagnosed acute on chronic systolic and diastolic congestive heart failure -Patient does not appear to have true pneumonia and procalcitonin is low -She is noting significant improvement today after some diuresis, and therefore I believe much of her symptoms are arising from her cardiomyopathy and congestive heart failure -Plan to discontinue Rocephin and doxycycline today -D-dimer was elevated and PE study performed which was negative -Continue on Lasix diuresis of 20 mg IV daily as started by cardiology -Daily weights and strict I's and O's -Wean oxygen as  tolerated  Newly discovered cardiomyopathy with acute CHF-combined as noted above -Entresto 24/26 mg p.o. twice daily started by cardiology -Continue on metoprolol 12.5 mg twice daily as prescribed 7/22 -Lasix pushes for diuresis as noted above -Monitor daily weights and I's and O's  Elevated troponin-likely demand ischemia -Patient also has LBBB and is a candidate for cardiac catheterization which has been proposed by cardiology.  Patient is going to think this over.  Essential hypertension-controlled -Continue current medications and monitor carefully with initiation of Entresto  Dyslipidemia -Started on pravastatin 40 mg nightly by cardiology  Hypothyroidism -Continue Synthroid -TSH within normal limits  Multiple system atrophy/Parkinson's -Follows with neurology Dr. Carles Collet -Continue on Sinemet  Obesity -Lifestyle changes  Anemia -Noted on lab work today -No overt bleeding -Check anemia panel and stool occult  DVT prophylaxis: Lovenox Code Status: Full Family Communication: Discussed with husband at bedside on 7/22 Disposition Plan:   Status is: Inpatient  Remains inpatient appropriate because:IV treatments appropriate due to intensity of illness or inability to take PO and Inpatient level of care appropriate due to severity of illness   Dispo: The patient is from: Home              Anticipated d/c is to: Home              Anticipated d/c date is: 1 day              Patient currently is not medically stable to d/c.  Consultants:   Cardiology  Procedures:   See below  Antimicrobials:  Anti-infectives (From admission, onward)   Start     Dose/Rate Route Frequency Ordered Stop  02/18/20 0600  cefTRIAXone (ROCEPHIN) 2 g in sodium chloride 0.9 % 100 mL IVPB  Status:  Discontinued        2 g 200 mL/hr over 30 Minutes Intravenous Every 24 hours 02/17/20 0310 02/18/20 1102   02/18/20 0600  azithromycin (ZITHROMAX) 500 mg in sodium chloride 0.9 % 250 mL IVPB   Status:  Discontinued        500 mg 250 mL/hr over 60 Minutes Intravenous Every 24 hours 02/17/20 0310 02/17/20 1348   02/17/20 2000  doxycycline (VIBRA-TABS) tablet 100 mg  Status:  Discontinued        100 mg Oral Every 12 hours 02/17/20 1348 02/18/20 1102   02/17/20 0130  cefTRIAXone (ROCEPHIN) 1 g in sodium chloride 0.9 % 100 mL IVPB        1 g 200 mL/hr over 30 Minutes Intravenous  Once 02/17/20 0125 02/17/20 0257   02/17/20 0130  azithromycin (ZITHROMAX) 500 mg in sodium chloride 0.9 % 250 mL IVPB        500 mg 250 mL/hr over 60 Minutes Intravenous  Once 02/17/20 0125 02/17/20 0346       Subjective: Patient seen and evaluated today with no new acute complaints or concerns. No acute concerns or events noted overnight.  She states that her breathing has improved this morning.  She denies any further cough.  Objective: Vitals:   02/18/20 0500 02/18/20 0807 02/18/20 0810 02/18/20 0826  BP:    (!) 129/56  Pulse:    77  Resp:    17  Temp:    98 F (36.7 C)  TempSrc:    Oral  SpO2:  99% 99% 100%  Weight: 90.2 kg     Height:        Intake/Output Summary (Last 24 hours) at 02/18/2020 1109 Last data filed at 02/18/2020 0827 Gross per 24 hour  Intake 120 ml  Output --  Net 120 ml   Filed Weights   02/17/20 2326 02/18/20 0500  Weight: 90.2 kg 90.2 kg    Examination:  General exam: Appears calm and comfortable  Respiratory system: Clear to auscultation. Respiratory effort normal.  Currently on 1.5 L nasal cannula oxygen. Cardiovascular system: S1 & S2 heard, RRR. No JVD, murmurs, rubs, gallops or clicks.  Scant pedal edema bilaterally. Gastrointestinal system: Abdomen is nondistended, soft and nontender. No organomegaly or masses felt. Normal bowel sounds heard. Central nervous system: Alert and oriented. No focal neurological deficits. Extremities: Symmetric 5 x 5 power. Skin: No rashes, lesions or ulcers Psychiatry: Judgement and insight appear normal. Mood & affect  appropriate.     Data Reviewed: I have personally reviewed following labs and imaging studies  CBC: Recent Labs  Lab 02/17/20 0021 02/17/20 0256 02/18/20 0602  WBC 7.6  --  7.7  NEUTROABS 3.8  --   --   HGB 12.2 12.6 10.5*  HCT 39.9 37.0 33.6*  MCV 93.2  --  91.6  PLT 311  --  308   Basic Metabolic Panel: Recent Labs  Lab 02/17/20 0021 02/17/20 0256 02/18/20 0602  NA 141 143 139  K 3.5 3.3* 3.5  CL 103  --  102  CO2 27  --  29  GLUCOSE 151*  --  105*  BUN 13  --  16  CREATININE 1.05*  --  0.94  CALCIUM 9.4  --  9.1  MG  --   --  2.0   GFR: Estimated Creatinine Clearance: 60.6 mL/min (by C-G formula based  on SCr of 0.94 mg/dL). Liver Function Tests: No results for input(s): AST, ALT, ALKPHOS, BILITOT, PROT, ALBUMIN in the last 168 hours. No results for input(s): LIPASE, AMYLASE in the last 168 hours. No results for input(s): AMMONIA in the last 168 hours. Coagulation Profile: No results for input(s): INR, PROTIME in the last 168 hours. Cardiac Enzymes: No results for input(s): CKTOTAL, CKMB, CKMBINDEX, TROPONINI in the last 168 hours. BNP (last 3 results) No results for input(s): PROBNP in the last 8760 hours. HbA1C: No results for input(s): HGBA1C in the last 72 hours. CBG: No results for input(s): GLUCAP in the last 168 hours. Lipid Profile: Recent Labs    02/18/20 0602  CHOL 236*  HDL 65  LDLCALC 162*  TRIG 47  CHOLHDL 3.6   Thyroid Function Tests: Recent Labs    02/18/20 0602  TSH 1.862   Anemia Panel: No results for input(s): VITAMINB12, FOLATE, FERRITIN, TIBC, IRON, RETICCTPCT in the last 72 hours. Sepsis Labs: Recent Labs  Lab 02/17/20 0135 02/18/20 0602  PROCALCITON  --  0.12  LATICACIDVEN 1.2  --     Recent Results (from the past 240 hour(s))  SARS Coronavirus 2 by RT PCR (hospital order, performed in [facility] hospital lab) Nasopharyngeal Nasopharyngeal Swab     Status: None   Collection Time: 02/17/20 12:21 AM   Specimen:  Nasopharyngeal Swab  Result Value Ref Range Status   SARS Coronavirus 2 NEGATIVE NEGATIVE Final    Comment: (NOTE) SARS-CoV-2 target nucleic acids are NOT DETECTED.  The SARS-CoV-2 RNA is generally detectable in upper and lower respiratory specimens during the acute phase of infection. The lowest concentration of SARS-CoV-2 viral copies this assay can detect is 250 copies / mL. A negative result does not preclude SARS-CoV-2 infection and should not be used as the sole basis for treatment or other patient management decisions.  A negative result may occur with improper specimen collection / handling, submission of specimen other than nasopharyngeal swab, presence of viral mutation(s) within the areas targeted by this assay, and inadequate number of viral copies (<250 copies / mL). A negative result must be combined with clinical observations, patient history, and epidemiological information.  Fact Sheet for Patients:   StrictlyIdeas.no  Fact Sheet for Healthcare Providers: BankingDealers.co.za  This test is not yet approved or  cleared by the [place] FDA and has been authorized for detection and/or diagnosis of SARS-CoV-2 by FDA under an Emergency Use Authorization (EUA).  This EUA will remain in effect (meaning this test can be used) for the duration of the COVID-19 declaration under Section 564(b)(1) of the Act, 21 U.S.C. section 360bbb-3(b)(1), unless the authorization is terminated or revoked sooner.  Performed at [facility], [place] [address], [place], [place] [address]   Culture, blood (Routine X 2) w Reflex to ID Panel     Status: None (Preliminary result)   Collection Time: 02/17/20  1:35 AM   Specimen: BLOOD  Result Value Ref Range Status   Specimen Description BLOOD RIGHT ANTECUBITAL  Final   Special Requests   Final    BOTTLES DRAWN AEROBIC AND ANAEROBIC Blood Culture adequate volume   Culture   Final    NO  GROWTH 1 DAY Performed at [facility], [place] [address], [place], [place] [address]    Report Status PENDING  Incomplete  Culture, blood (Routine X 2) w Reflex to ID Panel     Status: None (Preliminary result)   Collection Time: 02/17/20  2:22 AM   Specimen: BLOOD  Result Value Ref Range Status   Specimen Description BLOOD SITE NOT SPECIFIED  Final   Special Requests   Final    BOTTLES DRAWN AEROBIC AND ANAEROBIC Blood Culture results may not be optimal due to an excessive volume of blood received in culture bottles   Culture   Final    NO GROWTH 1 DAY Performed at [facility], [place] [address], [place], [place] [address]    Report Status PENDING  Incomplete         Radiology Studies: CT ANGIO CHEST PE W OR WO CONTRAST  Result Date: 02/17/2020 CLINICAL DATA:  Short of breath, positive D-dimer, pneumonia EXAM: CT ANGIOGRAPHY CHEST WITH CONTRAST TECHNIQUE: Multidetector CT imaging of the chest was performed using the standard protocol during bolus administration of intravenous contrast. Multiplanar CT image reconstructions and MIPs were obtained to evaluate the vascular anatomy. CONTRAST:  85mL OMNIPAQUE IOHEXOL 350 MG/ML SOLN COMPARISON:  02/17/2020 FINDINGS: Cardiovascular: This is a technically adequate evaluation of the pulmonary vasculature. No filling defects or pulmonary emboli. The heart is unremarkable without pericardial effusion. Normal caliber of the thoracic aorta. Mild atherosclerosis. Mediastinum/Nodes: No enlarged mediastinal, hilar, or axillary lymph nodes. Thyroid gland, trachea, and esophagus demonstrate no significant findings. Lungs/Pleura: Hypoventilatory changes are seen within the bilateral lower lobes. Mild background emphysema. No acute airspace disease, effusion, or pneumothorax. Central airways are patent. Upper Abdomen: No acute abnormality. Musculoskeletal: No acute or destructive bony lesions. Reconstructed images demonstrate no additional findings.  Review of the MIP images confirms the above findings. IMPRESSION: 1. No evidence of pulmonary embolus. 2. Hypoventilatory changes.  No acute airspace disease. 3. Aortic Atherosclerosis (ICD10-I70.0) and Emphysema (ICD10-J43.9). Electronically Signed   By: Randa Ngo M.D.   On: 02/17/2020 23:13   DG Chest Port 1 View  Result Date: 02/17/2020 CLINICAL DATA:  Shortness of breath. EXAM: PORTABLE CHEST 1 VIEW COMPARISON:  June 01, 2012. FINDINGS: There is a right lower lobe airspace opacity concerning for pneumonia. There is a linear opacity at the left lung base suggestive of atelectasis. Underlying emphysematous changes are suspected. The heart size is normal. There is prominence of the right hilum which may be in part due to patient rotation. No pneumothorax. There are trace bilateral pleural effusions. There is no acute osseous abnormality. IMPRESSION: 1. Findings concerning for right lower lobe pneumonia. 2. Trace bilateral pleural effusions. 3. Probable atelectasis at the left lung base. 4. Prominence of the right hilum may be in part due to patient rotation. A short interval repeat two-view chest x-ray is recommended. Electronically Signed   By: Constance Holster M.D.   On: 02/17/2020 00:55   ECHOCARDIOGRAM COMPLETE  Result Date: 02/17/2020    ECHOCARDIOGRAM REPORT   Patient Name:   [facility] Date of Exam: 02/17/2020 Medical Rec #:  [id number]     Height:       66.0 in Accession #:    [id number]    Weight:       178.8 lb Date of Birth:  04-16-1951      BSA:          1.907 m Patient Age:    24 years      BP:           128/93 mmHg Patient Gender: F             HR:           87 bpm. Exam Location:  Inpatient Procedure:  2D Echo, Cardiac Doppler and Color Doppler REPORT CONTAINS CRITICAL RESULT Indications:    R07.9* Chest pain, unspecified. Elevated troponin  History:        Patient has no prior history of Echocardiogram examinations.                 Signs/Symptoms:Chest Pain, Dyspnea and Shortness of  Breath.                 Respiratory failure. Elevated troponin.  Sonographer:    Roseanna Rainbow RDCS Referring Phys: [id number] Royanne Foots [place]  1. Left ventricular ejection fraction, by estimation, is 30 to 35%. The left ventricle has moderately decreased function. The left ventricle demonstrates global hypokinesis. Abnormal septal motion consistent with left bundle branch block.The left ventricular internal cavity size was mildly dilated. There is mild left ventricular hypertrophy. Left ventricular diastolic parameters are indeterminate.  2. Right ventricular systolic function is normal. The right ventricular size is normal. Tricuspid regurgitation signal is inadequate for assessing PA pressure.  3. The mitral valve is normal in structure. Mild mitral valve regurgitation.  4. The aortic valve has an indeterminant number of cusps. Aortic valve regurgitation is not visualized. Mild aortic valve sclerosis is present, with no evidence of aortic valve stenosis.  5. The inferior vena cava is dilated in size with <50% respiratory variability, suggesting right atrial pressure of 15 mmHg. FINDINGS  Left Ventricle: Left ventricular ejection fraction, by estimation, is 30 to 35%. The left ventricle has moderately decreased function. The left ventricle demonstrates global hypokinesis. The left ventricular internal cavity size was mildly dilated. There is mild left ventricular hypertrophy. There is severe asymmetric left ventricular Left ventricular diastolic parameters are indeterminate. Right Ventricle: The right ventricular size is normal. Right vetricular wall thickness was not assessed. Right ventricular systolic function is normal. Tricuspid regurgitation signal is inadequate for assessing PA pressure. Left Atrium: Left atrial size was normal in size. Right Atrium: Right atrial size was normal in size. Pericardium: There is no evidence of pericardial effusion. Mitral Valve: The mitral valve is normal in structure. Mild  mitral valve regurgitation. MV peak gradient, 8.8 mmHg. The mean mitral valve gradient is 2.0 mmHg. Tricuspid Valve: The tricuspid valve is normal in structure. Tricuspid valve regurgitation is trivial. Aortic Valve: The aortic valve has an indeterminant number of cusps. Aortic valve regurgitation is not visualized. Mild aortic valve sclerosis is present, with no evidence of aortic valve stenosis. Pulmonic Valve: The pulmonic valve was grossly normal. Pulmonic valve regurgitation is not visualized. Aorta: The aortic root and ascending aorta are structurally normal, with no evidence of dilitation. Venous: The inferior vena cava is dilated in size with less than 50% respiratory variability, suggesting right atrial pressure of 15 mmHg. IAS/Shunts: The interatrial septum was not well visualized.  LEFT VENTRICLE PLAX 2D LVIDd:         4.60 cm      Diastology LVIDs:         4.10 cm      LV e' lateral:   5.66 cm/s LV PW:         1.30 cm      LV E/e' lateral: 21.7 LV IVS:        1.20 cm      LV e' medial:    5.11 cm/s LVOT diam:     1.80 cm      LV E/e' medial:  24.1 LV SV:         66 LV SV Index:  35 LVOT Area:     2.54 cm  LV Volumes (MOD) LV vol d, MOD A2C: 126.0 ml LV vol d, MOD A4C: 113.0 ml LV vol s, MOD A2C: 86.6 ml LV vol s, MOD A4C: 86.6 ml LV SV MOD A2C:     39.4 ml LV SV MOD A4C:     113.0 ml LV SV MOD BP:      34.3 ml RIGHT VENTRICLE             IVC RV S prime:     12.10 cm/s  IVC diam: 2.80 cm TAPSE (M-mode): 2.5 cm LEFT ATRIUM             Index       RIGHT ATRIUM           Index LA diam:        3.60 cm 1.89 cm/m  RA Area:     12.90 cm LA Vol (A2C):   46.0 ml 24.12 ml/m RA Volume:   29.90 ml  15.68 ml/m LA Vol (A4C):   50.8 ml 26.64 ml/m LA Biplane Vol: 50.4 ml 26.43 ml/m  AORTIC VALVE             PULMONIC VALVE LVOT Vmax:   135.00 cm/s PR End Diast Vel: 1.75 msec LVOT Vmean:  88.400 cm/s LVOT VTI:    0.260 m  AORTA Ao Root diam: 2.90 cm Ao Asc diam:  3.00 cm MITRAL VALVE MV Area (PHT): 6.54 cm       SHUNTS MV Peak grad:  8.8 mmHg      Systemic VTI:  0.26 m MV Mean grad:  2.0 mmHg      Systemic Diam: 1.80 cm MV Vmax:       1.48 m/s MV Vmean:      62.9 cm/s MV Decel Time: 116 msec MR Peak grad:    91.0 mmHg MR Mean grad:    61.0 mmHg MR Vmax:         477.00 cm/s MR Vmean:        366.0 cm/s MR PISA:         2.26 cm MR PISA Eff ROA: 15 mm MR PISA Radius:  0.60 cm MV E velocity: 123.00 cm/s Oswaldo Milian MD Electronically signed by Oswaldo Milian MD Signature Date/Time: 02/17/2020/3:35:22 PM    Final         Scheduled Meds:  amantadine  100 mg Oral TID   Carbidopa-Levodopa ER  1 tablet Oral 6 X Daily   citalopram  20 mg Oral Daily   enoxaparin (LOVENOX) injection  40 mg Subcutaneous Q24H   feeding supplement (ENSURE ENLIVE)  237 mL Oral BID BM   fluticasone  1 spray Each Nare Daily   fluticasone furoate-vilanterol  1 puff Inhalation Daily   furosemide  20 mg Intravenous Daily   levothyroxine  112 mcg Oral Daily   linaclotide  290 mcg Oral Daily   metoprolol tartrate  12.5 mg Oral BID   mometasone-formoterol  2 puff Inhalation BID   montelukast  10 mg Oral QHS   multivitamin with minerals  2 tablet Oral Daily   pantoprazole  40 mg Oral Daily   pravastatin  40 mg Oral q1800   predniSONE  40 mg Oral Q breakfast   sacubitril-valsartan  1 tablet Oral BID   tamsulosin  0.4 mg Oral Daily    LOS: 1 day    Time spent: 30 minutes    Odie Rauen Darleen Crocker, DO [facility]  If 7PM-7AM, please contact night-coverage [email or web address] 02/18/2020, 11:09 AM

## 2020-02-18 NOTE — Progress Notes (Signed)
Nutrition Brief Note  Patient identified on the Malnutrition Screening Tool (MST) Report  Wt Readings from Last 15 Encounters:  02/18/20 90.2 kg  09/22/19 81.1 kg  09/09/19 78.9 kg  06/21/19 82.4 kg  03/09/19 88.7 kg  08/04/18 96.7 kg  03/10/18 98.4 kg  10/08/17 98.9 kg  12/04/16 89.8 kg  06/13/16 97.5 kg  03/21/16 98 kg  02/08/16 101.2 kg  08/14/15 98.9 kg  04/13/15 96.6 kg  02/16/15 94.8 kg   Rachel [name] is a 69 y.o. female with medical history significant of asthma, Parkinson's dz.  Pt admitted with CAP of RLL.   Reviewed I/O's: -150 ml x 24 hors and +200 ml x 24 hours  UOP: 450 ml x 24 hours  Spoke with pt at bedside, who reports that her appetite has bene fair. Noted meal completion 60-100%. Pt has been eating more fresh fruits and vegetables as well as lean meat to help reduce fat and sodium in her diet.   She shares she has lost some weight intentionally. Weight has been stable over the past year.   Nutrition-Focused physical exam completed. Findings are no fat depletion, no muscle depletion, and mild edema.   Medications reviewed and include prednisone.  Labs reviewed.   Body mass index is 34.13 kg/m. Patient meets criteria for obesity, class I based on current BMI.   Current diet order is Heart Healthy, patient is consuming approximately 60-100% of meals at this time. Labs and medications reviewed.   No nutrition interventions warranted at this time. If nutrition issues arise, please consult RD.   Loistine Chance, RD, LDN, [place] Registered Dietitian II Certified Diabetes Care and Education Specialist Please refer to [facility] for RD and/or RD on-call/weekend/after hours pager

## 2020-02-19 DIAGNOSIS — R9431 Abnormal electrocardiogram [ECG] [EKG]: Secondary | ICD-10-CM

## 2020-02-19 LAB — BASIC METABOLIC PANEL
Anion gap: 11 (ref 5–15)
BUN: 21 mg/dL (ref 8–23)
CO2: 28 mmol/L (ref 22–32)
Calcium: 8.8 mg/dL — ABNORMAL LOW (ref 8.9–10.3)
Chloride: 100 mmol/L (ref 98–111)
Creatinine, Ser: 1.1 mg/dL — ABNORMAL HIGH (ref 0.44–1.00)
GFR calc Af Amer: 59 mL/min — ABNORMAL LOW (ref >60)
GFR calc non Af Amer: 51 mL/min — ABNORMAL LOW (ref >60)
Glucose, Bld: 114 mg/dL — ABNORMAL HIGH (ref 70–99)
Potassium: 3.4 mmol/L — ABNORMAL LOW (ref 3.5–5.1)
Sodium: 139 mmol/L (ref 135–145)

## 2020-02-19 LAB — CBC
HCT: 35.4 % — ABNORMAL LOW (ref 36.0–46.0)
Hemoglobin: 11.5 g/dL — ABNORMAL LOW (ref 12.0–15.0)
MCH: 29.6 pg (ref 26.0–34.0)
MCHC: 32.5 g/dL (ref 30.0–36.0)
MCV: 91.2 fL (ref 80.0–100.0)
Platelets: 267 10*3/uL (ref 150–400)
RBC: 3.88 MIL/uL (ref 3.87–5.11)
RDW: 13.6 % (ref 11.5–15.5)
WBC: 7.5 10*3/uL (ref 4.0–10.5)
nRBC: 0 % (ref 0.0–0.2)

## 2020-02-19 LAB — MAGNESIUM: Magnesium: 1.8 mg/dL (ref 1.7–2.4)

## 2020-02-19 MED ORDER — POTASSIUM CHLORIDE CRYS ER 20 MEQ PO TBCR
40.0000 meq | EXTENDED_RELEASE_TABLET | Freq: Once | ORAL | Status: AC
  Administered 2020-02-19: 40 meq via ORAL
  Filled 2020-02-19: qty 2

## 2020-02-19 MED ORDER — METOPROLOL SUCCINATE ER 25 MG PO TB24
25.0000 mg | ORAL_TABLET | Freq: Every day | ORAL | Status: DC
  Administered 2020-02-20: 25 mg via ORAL
  Filled 2020-02-19: qty 1

## 2020-02-19 MED ORDER — MAGNESIUM OXIDE 400 (241.3 MG) MG PO TABS
400.0000 mg | ORAL_TABLET | Freq: Two times a day (BID) | ORAL | Status: DC
  Administered 2020-02-19 – 2020-02-21 (×4): 400 mg via ORAL
  Filled 2020-02-19 (×5): qty 1

## 2020-02-19 NOTE — Progress Notes (Signed)
Progress Note  Patient Name: Rachel [name] Date of Encounter: 02/19/2020  Attending physician: Rodena Goldmann, DO Primary care provider: Marton Redwood, MD Primary Cardiologist: NA Consultant:Barby Colvard Terri Skains DO, [facility]  Subjective: Rachel [name] is a 69 y.o. female who was seen and examined at bedside at approximately 2:30 PM.  Patient states that her shortness of breath has improved since admission.  No longer requiring nasal cannula oxygen.  She denies chest pain at rest.   Objective: Vital Signs in the last 24 hours: Temp:  [97.4 F (36.3 C)-98.4 F (36.9 C)] 98 F (36.7 C) (07/24 1150) Pulse Rate:  [64-71] 71 (07/24 1207) Resp:  [18] 18 (07/24 1150) BP: (133-147)/(63-85) 139/70 (07/24 1207) SpO2:  [93 %-100 %] 95 % (07/24 1207) Weight:  [79.4 kg] 79.4 kg (07/24 0059)  Intake/Output:  Intake/Output Summary (Last 24 hours) at 02/19/2020 1414 Last data filed at 02/19/2020 1300 Gross per 24 hour  Intake 520 ml  Output 1400 ml  Net -880 ml    Net IO Since Admission: -680 mL [02/19/20 1414]  Weights:  Filed Weights   02/17/20 2326 02/18/20 0500 02/19/20 0059  Weight: 90.2 kg 90.2 kg 79.4 kg    Telemetry: Normal sinus rhythm, prolonged QT interval  Physical examination: PHYSICAL EXAM: Vitals with BMI 02/19/2020 02/19/2020 02/19/2020  Height - - -  Weight - - -  BMI - - -  Systolic 409 735 329  Diastolic 70 70 68  Pulse 71 65 64    CONSTITUTIONAL: Frail-appearing elderly female, hemodynamically stable, No acute distress.  SKIN: Skin is warm and dry. No rash noted. No cyanosis. No pallor. No jaundice HEAD: Normocephalic and atraumatic.  EYES: No scleral icterus MOUTH/THROAT: Moist oral membranes.  NECK: JVD present. No thyromegaly noted. No carotid bruits  LYMPHATIC: No visible cervical adenopathy.  CHEST: Poor respiratory effort. No intercostal retractions.  Coughing with deep inspiration. LUNGS: Decreased breath sounds bilaterally.  No stridor. No wheezes. No rales.   CARDIOVASCULAR: Regular, positive S1-S2, no gallops rubs or murmurs appreciated. ABDOMINAL: Soft, nontender, nondistended, no apparent ascites.  EXTREMITIES: No peripheral edema.  Warm to touch bilaterally.  2+ bilateral posterior tibial pulses. HEMATOLOGIC: No significant bruising NEUROLOGIC: Oriented to person, place, and time. Nonfocal. Normal muscle tone.  PSYCHIATRIC: Normal mood and affect. Normal behavior. Cooperative  Lab Results: Hematology Recent Labs  Lab 02/17/20 0021 02/17/20 0021 02/17/20 0256 02/18/20 0602 02/18/20 1609 02/19/20 0418  WBC 7.6  --   --  7.7  --  7.5  RBC 4.28   < >  --  3.67* 4.01 3.88  HGB 12.2   < > 12.6 10.5*  --  11.5*  HCT 39.9   < > 37.0 33.6*  --  35.4*  MCV 93.2  --   --  91.6  --  91.2  MCH 28.5  --   --  28.6  --  29.6  MCHC 30.6  --   --  31.3  --  32.5  RDW 13.6  --   --  13.7  --  13.6  PLT 311  --   --  245  --  267   < > = values in this interval not displayed.    Chemistry Recent Labs  Lab 02/17/20 0021 02/17/20 0021 02/17/20 0256 02/18/20 0602 02/19/20 0418  NA 141   < > 143 139 139  K 3.5   < > 3.3* 3.5 3.4*  CL 103  --   --  102 100  CO2 27  --   --  29 28  GLUCOSE 151*  --   --  105* 114*  BUN 13  --   --  16 21  CREATININE 1.05*  --   --  0.94 1.10*  CALCIUM 9.4  --   --  9.1 8.8*  GFRNONAA 54*  --   --  >60 51*  GFRAA >60  --   --  >60 59*  ANIONGAP 11  --   --  8 11   < > = values in this interval not displayed.     Cardiac Enzymes: Cardiac Panel (last 3 results) Recent Labs    02/17/20 0439 02/17/20 0753 02/17/20 0952  TROPONINIHS 78* 84* 72*    BNP (last 3 results) Recent Labs    02/17/20 0021  BNP 484.2*    ProBNP (last 3 results) No results for input(s): PROBNP in the last 8760 hours.   DDimer  Recent Labs  Lab 02/17/20 1900  DDIMER 0.68*     Hemoglobin A1c: No results found for: HGBA1C, MPG  TSH  Recent Labs    02/18/20 0602  TSH 1.862    Lipid Panel     Component  Value Date/Time   CHOL 236 (H) 02/18/2020 0602   TRIG 47 02/18/2020 0602   HDL 65 02/18/2020 0602   CHOLHDL 3.6 02/18/2020 0602   VLDL 9 02/18/2020 0602   LDLCALC 162 (H) 02/18/2020 0602   Imaging: CT ANGIO CHEST PE W OR WO CONTRAST  Result Date: 02/17/2020 CLINICAL DATA:  Short of breath, positive D-dimer, pneumonia EXAM: CT ANGIOGRAPHY CHEST WITH CONTRAST TECHNIQUE: Multidetector CT imaging of the chest was performed using the standard protocol during bolus administration of intravenous contrast. Multiplanar CT image reconstructions and MIPs were obtained to evaluate the vascular anatomy. CONTRAST:  66mL OMNIPAQUE IOHEXOL 350 MG/ML SOLN COMPARISON:  02/17/2020 FINDINGS: Cardiovascular: This is a technically adequate evaluation of the pulmonary vasculature. No filling defects or pulmonary emboli. The heart is unremarkable without pericardial effusion. Normal caliber of the thoracic aorta. Mild atherosclerosis. Mediastinum/Nodes: No enlarged mediastinal, hilar, or axillary lymph nodes. Thyroid gland, trachea, and esophagus demonstrate no significant findings. Lungs/Pleura: Hypoventilatory changes are seen within the bilateral lower lobes. Mild background emphysema. No acute airspace disease, effusion, or pneumothorax. Central airways are patent. Upper Abdomen: No acute abnormality. Musculoskeletal: No acute or destructive bony lesions. Reconstructed images demonstrate no additional findings. Review of the MIP images confirms the above findings. IMPRESSION: 1. No evidence of pulmonary embolus. 2. Hypoventilatory changes.  No acute airspace disease. 3. Aortic Atherosclerosis (ICD10-I70.0) and Emphysema (ICD10-J43.9). Electronically Signed   By: Randa Ngo M.D.   On: 02/17/2020 23:13    Cardiac database: EKG: 02/17/2020 @ 00:16: Sinus tachycardia, 112 bpm, left bundle branch block, ST-T changes most likely secondary to LBBB, poor R wave progression, no significant change compared to prior  ECG.  Echocardiogram: 02/17/2020: LVEF 30-35%, moderately decreased LVEF, global hypokinesis, abnormal septal motion consistent with LBBB, LV size mildly dilated, mild LVH, grade 2 diastolic impairment, mild MR, aortic valve sclerosis without stenosis.  Stress Testing:  2013, per patient but results available to review.   Heart Catheterization: None  Scheduled Meds: . amantadine  100 mg Oral TID  . Carbidopa-Levodopa ER  1 tablet Oral 6 X Daily  . citalopram  20 mg Oral Daily  . enoxaparin (LOVENOX) injection  40 mg Subcutaneous Q24H  . fluticasone  1 spray Each Nare Daily  . fluticasone furoate-vilanterol  1 puff Inhalation Daily  . furosemide  20 mg  Intravenous Daily  . levothyroxine  112 mcg Oral Daily  . linaclotide  290 mcg Oral Daily  . metoprolol tartrate  12.5 mg Oral BID  . mometasone-formoterol  2 puff Inhalation BID  . montelukast  10 mg Oral QHS  . multivitamin with minerals  2 tablet Oral Daily  . pantoprazole  40 mg Oral Daily  . pravastatin  40 mg Oral q1800  . sacubitril-valsartan  1 tablet Oral BID  . tamsulosin  0.4 mg Oral Daily    Continuous Infusions:   PRN Meds: acetaminophen, albuterol   IMPRESSION & RECOMMENDATIONS: Rachel [name] is a 69 y.o. female whose past medical history and cardiac risk factors include: New diagnosis of cardiomyopathy etiology unknown, acute systolic and diastolic heart failure/stage C/NYHA class II/III, multiple system atrophy, asthma, history of left lower extremity DVT, hypertension, hyperlipidemia, hypothyroidism, left bundle branch block, advanced age, postmenopausal female.  Newly diagnosed combined systolic and diastolic heart failure, stage C, NYHA class II/III:  Clinically patient is not in overt heart failure.  However, patient does have elevated BNP and JVP has improved.   Discontinue Lasix 20 mg IV push daily as creatinine is trending up and patient is euvolemic on clinical examination  Continue Entresto 24/26  mg p.o. twice daily.   Transition Lopressor to Toprol-XL starting tomorrow.  We will consider addition of Aldactone tomorrow.  We will continue to uptitrate guideline directed medical therapy as hemodynamics and laboratory values allow.    Recommend strict I's and O's and daily weights.  Echocardiogram results noted above.  Newly discovered cardiomyopathy: See above, etiology unknown.  Elevated troponin most likely secondary to type II MI from supply demand ischemia in the setting of underlying cardiomyopathy.    Patient did have a rise and fall in troponin with peak troponin 84, trending downward.    Patient's LVEF is moderately reduced which is a new finding for the patient and in addition to that has multiple cardiovascular risk factors.  We discussed ischemic evaluation and patient would like to proceed with left heart catheterization prior to discharge.  Spoke to both the patient and her husband [name] over the phone regarding the left heart catheterization.  The left heart catheterization procedure was explained to the patient in detail. The indication, alternatives, risks and benefits were reviewed. Complications including but not limited to bleeding, infection, acute kidney injury, blood transfusion, heart rhythm disturbances, contrast (dye) reaction, damage to the arteries or nerves in the legs or hands, cerebrovascular accident, myocardial infarction, need for emergent bypass surgery, blood clots in the legs, possible need for emergent blood transfusion, and rarely death were reviewed and discussed with the patient. The patient and husband voices understanding and wishes to proceed.   Tentatively scheduled for Monday, February 21, 2020  Hyperlipidemia:  Started on pravastatin on 02/18/2020.  Patient tolerated the medication well without any side effects or intolerances.  Will need follow-up lipid profile as outpatient.  Prolonged QT on telemetry:  Patient is  asymptomatic.  Check EKG.  Recommend potassium of 4 and magnesium 2.  Also discussed with attending physician regards to reviewing current medications avoid QT prolonging drugs if possible.  We will start magnesium oxide 400 mg p.o. twice daily.  Continue to monitor  Acute hypoxic respiratory failure: Improving  Patient presented to the hospital with acute hypoxia and respiratory distress requiring BiPAP intervention and now currently requiring nasal cannula oxygen.    Secondary to community-acquired pneumonia, newly discovered cardiomyopathy, and acute systolic/diastolic heart failure.  D-dimer positive, subsequently underwent CT angio PE protocol negative for pulmonary embolism  Patient has been treated with antibiotics given her underlying pneumonia.  She was not requiring nasal cannula oxygen.  Continue to monitor  Shortness of breath, on admission: Improving.  See above  Other: Benign essential hypertension: Management as per your care Hyperlipidemia: In the past patient has not tolerated statin therapy well.  However, patient is willing to be on statin therapy.  Continue pravastatin 40 mg p.o. nightly. Left bundle branch block: Continue to monitor. History of left lower extremity DVT. Hypothyroidism: Continue management as per your care Community acquired pneumonia, right lower lobe: Management as per your care  Patient's questions and concerns were addressed to her satisfaction. She voices understanding of the instructions provided during this encounter.   Plan of care discussed with attending physician over the phone.  This note was created using a voice recognition software as a result there may be grammatical errors inadvertently enclosed that do not reflect the nature of this encounter. Every attempt is made to correct such errors.  Rex Kras, DO, [facility]. [place] Office: [phone] 02/19/2020, 2:14 PM

## 2020-02-19 NOTE — Progress Notes (Signed)
PROGRESS NOTE    Rachel [name]  ZOX:[id number] DOB: 12-21-1950 DOA: 02/17/2020 PCP: Marton Redwood, MD   Brief Narrative:   This is a 69 year old female with history of multiple system atrophy/parkinsonism as well as asthma.  She was initially diagnosed with community-acquired pneumonia of the right lower lobe of her lung, but she was not noted to have any significant leukocytosis, fevers, or chills.  She was started on antibiotic treatment with Rocephin and azithromycin which was then changed over to doxycycline.  She was noted to have significant cardiomyopathy with LV dysfunction and EF 30-35% on 2D echocardiogram yesterday.  EKG with left bundle branch block.  She denied any chest pain and troponin trend had remained flat.  Cardiology evaluation performed and patient was started on some gentle IV diuresis with Lasix.  Assessment & Plan:   Principal Problem:   Community acquired pneumonia of right lower lobe of lung Active Problems:   Multiple system atrophy, 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HCC)   Mild intermittent asthma with (acute) exacerbation   Acute respiratory failure with hypoxia (HCC)   Acute combined systolic and diastolic heart failure (HCC)   Class 1 obesity due to excess calories with serious comorbidity and body mass index (BMI) of 34.0 to 34.9 in adult   Acute hypoxemic respiratory failure likely secondary to newly diagnosed acute on chronic systolic and diastolic congestive heart failure-improved -Patient does not appear to have true pneumonia and procalcitonin is low -She is noting significant improvement today after some diuresis, and therefore I believe much of her symptoms are arising from her cardiomyopathy and congestive heart failure -Rocephin and doxycycline discontinued on 7/23 -D-dimer was elevated and PE study performed which was negative -Continue on Lasix diuresis of 20 mg IV daily as started by cardiology -Daily weights and strict I's and O's, weight is currently  measured at 175 pounds, 850 mL negative fluid balance in the last 24 hours -Wean oxygen as tolerated  Newly discovered cardiomyopathy with acute CHF-combined as noted above -Entresto 24/26 mg p.o. twice daily started by cardiology -Continue on metoprolol 12.5 mg twice daily as prescribed 7/22 -Lasix pushes for diuresis as noted above -Monitor daily weights and I's and O's  Elevated troponin-likely demand ischemia -Patient also has LBBB and is a candidate for cardiac catheterization which has been proposed by cardiology. -Patient agreeable to cardiac catheterization which will be performed 7/26  Essential hypertension-controlled -Continue current medications and monitor carefully with initiation of Entresto  Dyslipidemia -Started on pravastatin 40 mg nightly by cardiology  Hypothyroidism -Continue Synthroid -TSH within normal limits  Multiple system atrophy/Parkinson's -Follows with neurology Dr. Carles Collet -Continue on Sinemet  Obesity -Lifestyle changes  Anemia-stable -No overt bleeding -Anemia panel without significant findings -Stool occult pending  DVT prophylaxis: Lovenox Code Status: Full Family Communication: Discussed with husband on phone 7/23 Disposition Plan:   Status is: Inpatient  Remains inpatient appropriate because:IV treatments appropriate due to intensity of illness or inability to take PO and Inpatient level of care appropriate due to severity of illness   Dispo: The patient is from: Home  Anticipated d/c is to: Home  Anticipated d/c date is: 2 days  Patient currently is not medically stable to d/c.  Plans are for cardiac catheterization on 7/26.  Consultants:   Cardiology  Procedures:   See below  Antimicrobials:  Anti-infectives (From admission, onward)   Start     Dose/Rate Route Frequency Ordered Stop   02/18/20 0600  cefTRIAXone (ROCEPHIN) 2 g in sodium chloride 0.9 % 100 mL  IVPB  Status:   Discontinued        2 g 200 mL/hr over 30 Minutes Intravenous Every 24 hours 02/17/20 0310 02/18/20 1102   02/18/20 0600  azithromycin (ZITHROMAX) 500 mg in sodium chloride 0.9 % 250 mL IVPB  Status:  Discontinued        500 mg 250 mL/hr over 60 Minutes Intravenous Every 24 hours 02/17/20 0310 02/17/20 1348   02/17/20 2000  doxycycline (VIBRA-TABS) tablet 100 mg  Status:  Discontinued        100 mg Oral Every 12 hours 02/17/20 1348 02/18/20 1102   02/17/20 0130  cefTRIAXone (ROCEPHIN) 1 g in sodium chloride 0.9 % 100 mL IVPB        1 g 200 mL/hr over 30 Minutes Intravenous  Once 02/17/20 0125 02/17/20 0257   02/17/20 0130  azithromycin (ZITHROMAX) 500 mg in sodium chloride 0.9 % 250 mL IVPB        500 mg 250 mL/hr over 60 Minutes Intravenous  Once 02/17/20 0125 02/17/20 0346       Subjective: Patient seen and evaluated today with no new acute complaints or concerns. No acute concerns or events noted overnight.  She states that she is overall feeling much better and breathing has improved.  Objective: Vitals:   02/18/20 2035 02/19/20 0059 02/19/20 0325 02/19/20 0812  BP: (!) 137/85 (!) 145/81 (!) 147/77 (!) 140/68  Pulse: 71 69 64 64  Resp: 18 18 18 18   Temp: 98.2 F (36.8 C) (!) 97.4 F (36.3 C) 98 F (36.7 C) 97.6 F (36.4 C)  TempSrc: Oral Oral Oral Oral  SpO2: 96% 100% 93% 97%  Weight:  79.4 kg    Height:        Intake/Output Summary (Last 24 hours) at 02/19/2020 0954 Last data filed at 02/19/2020 0900 Gross per 24 hour  Intake 700 ml  Output 1550 ml  Net -850 ml   Filed Weights   02/17/20 2326 02/18/20 0500 02/19/20 0059  Weight: 90.2 kg 90.2 kg 79.4 kg    Examination:  General exam: Appears calm and comfortable  Respiratory system: Clear to auscultation. Respiratory effort normal.  Currently on room air, no wheezing. Cardiovascular system: S1 & S2 heard, RRR. No JVD, murmurs, rubs, gallops or clicks.  Pedal edema much improved. Gastrointestinal system:  Abdomen is nondistended, soft and nontender. No organomegaly or masses felt. Normal bowel sounds heard. Central nervous system: Alert and oriented. No focal neurological deficits. Extremities: Symmetric 5 x 5 power. Skin: No rashes, lesions or ulcers Psychiatry: Judgement and insight appear normal. Mood & affect appropriate.     Data Reviewed: I have personally reviewed following labs and imaging studies  CBC: Recent Labs  Lab 02/17/20 0021 02/17/20 0256 02/18/20 0602 02/19/20 0418  WBC 7.6  --  7.7 7.5  NEUTROABS 3.8  --   --   --   HGB 12.2 12.6 10.5* 11.5*  HCT 39.9 37.0 33.6* 35.4*  MCV 93.2  --  91.6 91.2  PLT 311  --  245 749   Basic Metabolic Panel: Recent Labs  Lab 02/17/20 0021 02/17/20 0256 02/18/20 0602 02/19/20 0418  NA 141 143 139 139  K 3.5 3.3* 3.5 3.4*  CL 103  --  102 100  CO2 27  --  29 28  GLUCOSE 151*  --  105* 114*  BUN 13  --  16 21  CREATININE 1.05*  --  0.94 1.10*  CALCIUM 9.4  --  9.1 8.8*  MG  --   --  2.0 1.8   GFR: Estimated Creatinine Clearance: 48.5 mL/min (A) (by C-G formula based on SCr of 1.1 mg/dL (H)). Liver Function Tests: No results for input(s): AST, ALT, ALKPHOS, BILITOT, PROT, ALBUMIN in the last 168 hours. No results for input(s): LIPASE, AMYLASE in the last 168 hours. No results for input(s): AMMONIA in the last 168 hours. Coagulation Profile: No results for input(s): INR, PROTIME in the last 168 hours. Cardiac Enzymes: No results for input(s): CKTOTAL, CKMB, CKMBINDEX, TROPONINI in the last 168 hours. BNP (last 3 results) No results for input(s): PROBNP in the last 8760 hours. HbA1C: No results for input(s): HGBA1C in the last 72 hours. CBG: No results for input(s): GLUCAP in the last 168 hours. Lipid Profile: Recent Labs    02/18/20 0602  CHOL 236*  HDL 65  LDLCALC 162*  TRIG 47  CHOLHDL 3.6   Thyroid Function Tests: Recent Labs    02/18/20 0602  TSH 1.862   Anemia Panel: Recent Labs     02/18/20 1609  VITAMINB12 194  FOLATE 19.7  FERRITIN 42  TIBC 385  IRON 34  RETICCTPCT 1.5   Sepsis Labs: Recent Labs  Lab 02/17/20 0135 02/18/20 0602  PROCALCITON  --  0.12  LATICACIDVEN 1.2  --     Recent Results (from the past 240 hour(s))  SARS Coronavirus 2 by RT PCR (hospital order, performed in [place] [facility] hospital lab) Nasopharyngeal Nasopharyngeal Swab     Status: None   Collection Time: 02/17/20 12:21 AM   Specimen: Nasopharyngeal Swab  Result Value Ref Range Status   SARS Coronavirus 2 NEGATIVE NEGATIVE Final    Comment: (NOTE) SARS-CoV-2 target nucleic acids are NOT DETECTED.  The SARS-CoV-2 RNA is generally detectable in upper and lower respiratory specimens during the acute phase of infection. The lowest concentration of SARS-CoV-2 viral copies this assay can detect is 250 copies / mL. A negative result does not preclude SARS-CoV-2 infection and should not be used as the sole basis for treatment or other patient management decisions.  A negative result may occur with improper specimen collection / handling, submission of specimen other than nasopharyngeal swab, presence of viral mutation(s) within the areas targeted by this assay, and inadequate number of viral copies (<250 copies / mL). A negative result must be combined with clinical observations, patient history, and epidemiological information.  Fact Sheet for Patients:   StrictlyIdeas.no  Fact Sheet for Healthcare Providers: BankingDealers.co.za  This test is not yet approved or  cleared by the [place] FDA and has been authorized for detection and/or diagnosis of SARS-CoV-2 by FDA under an Emergency Use Authorization (EUA).  This EUA will remain in effect (meaning this test can be used) for the duration of the COVID-19 declaration under Section 564(b)(1) of the Act, 21 U.S.C. section 360bbb-3(b)(1), unless the authorization is terminated  or revoked sooner.  Performed at [facility], [place] [address], [place], [place] [address]   Culture, blood (Routine X 2) w Reflex to ID Panel     Status: None (Preliminary result)   Collection Time: 02/17/20  1:35 AM   Specimen: BLOOD  Result Value Ref Range Status   Specimen Description BLOOD RIGHT ANTECUBITAL  Final   Special Requests   Final    BOTTLES DRAWN AEROBIC AND ANAEROBIC Blood Culture adequate volume   Culture   Final    NO GROWTH 2 DAYS Performed at [facility], [place],  [place] [address]    Report Status PENDING  Incomplete  Culture, blood (Routine X 2) w Reflex to ID Panel     Status: None (Preliminary result)   Collection Time: 02/17/20  2:22 AM   Specimen: BLOOD  Result Value Ref Range Status   Specimen Description BLOOD SITE NOT SPECIFIED  Final   Special Requests   Final    BOTTLES DRAWN AEROBIC AND ANAEROBIC Blood Culture results may not be optimal due to an excessive volume of blood received in culture bottles   Culture   Final    NO GROWTH 2 DAYS Performed at [facility], [place] [address] [place], [place] [address]    Report Status PENDING  Incomplete         Radiology Studies: CT ANGIO CHEST PE W OR WO CONTRAST  Result Date: 02/17/2020 CLINICAL DATA:  Short of breath, positive D-dimer, pneumonia EXAM: CT ANGIOGRAPHY CHEST WITH CONTRAST TECHNIQUE: Multidetector CT imaging of the chest was performed using the standard protocol during bolus administration of intravenous contrast. Multiplanar CT image reconstructions and MIPs were obtained to evaluate the vascular anatomy. CONTRAST:  37mL OMNIPAQUE IOHEXOL 350 MG/ML SOLN COMPARISON:  02/17/2020 FINDINGS: Cardiovascular: This is a technically adequate evaluation of the pulmonary vasculature. No filling defects or pulmonary emboli. The heart is unremarkable without pericardial effusion. Normal caliber of the thoracic aorta. Mild atherosclerosis. Mediastinum/Nodes: No enlarged  mediastinal, hilar, or axillary lymph nodes. Thyroid gland, trachea, and esophagus demonstrate no significant findings. Lungs/Pleura: Hypoventilatory changes are seen within the bilateral lower lobes. Mild background emphysema. No acute airspace disease, effusion, or pneumothorax. Central airways are patent. Upper Abdomen: No acute abnormality. Musculoskeletal: No acute or destructive bony lesions. Reconstructed images demonstrate no additional findings. Review of the MIP images confirms the above findings. IMPRESSION: 1. No evidence of pulmonary embolus. 2. Hypoventilatory changes.  No acute airspace disease. 3. Aortic Atherosclerosis (ICD10-I70.0) and Emphysema (ICD10-J43.9). Electronically Signed   By: Randa Ngo M.D.   On: 02/17/2020 23:13   ECHOCARDIOGRAM COMPLETE  Result Date: 02/17/2020    ECHOCARDIOGRAM REPORT   Patient Name:   [facility] Date of Exam: 02/17/2020 Medical Rec #:  [id number]     Height:       66.0 in Accession #:    [id number]    Weight:       178.8 lb Date of Birth:  1950-09-21      BSA:          1.907 m Patient Age:    4 years      BP:           128/93 mmHg Patient Gender: F             HR:           87 bpm. Exam Location:  Inpatient Procedure: 2D Echo, Cardiac Doppler and Color Doppler REPORT CONTAINS CRITICAL RESULT Indications:    R07.9* Chest pain, unspecified. Elevated troponin  History:        Patient has no prior history of Echocardiogram examinations.                 Signs/Symptoms:Chest Pain, Dyspnea and Shortness of Breath.                 Respiratory failure. Elevated troponin.  Sonographer:    Roseanna Rainbow RDCS Referring Phys: [id number] Royanne Foots [place]  1. Left ventricular ejection fraction, by estimation, is 30 to 35%. The left ventricle has moderately decreased function. The  left ventricle demonstrates global hypokinesis. Abnormal septal motion consistent with left bundle branch block.The left ventricular internal cavity size was mildly dilated. There is mild left  ventricular hypertrophy. Left ventricular diastolic parameters are indeterminate.  2. Right ventricular systolic function is normal. The right ventricular size is normal. Tricuspid regurgitation signal is inadequate for assessing PA pressure.  3. The mitral valve is normal in structure. Mild mitral valve regurgitation.  4. The aortic valve has an indeterminant number of cusps. Aortic valve regurgitation is not visualized. Mild aortic valve sclerosis is present, with no evidence of aortic valve stenosis.  5. The inferior vena cava is dilated in size with <50% respiratory variability, suggesting right atrial pressure of 15 mmHg. FINDINGS  Left Ventricle: Left ventricular ejection fraction, by estimation, is 30 to 35%. The left ventricle has moderately decreased function. The left ventricle demonstrates global hypokinesis. The left ventricular internal cavity size was mildly dilated. There is mild left ventricular hypertrophy. There is severe asymmetric left ventricular Left ventricular diastolic parameters are indeterminate. Right Ventricle: The right ventricular size is normal. Right vetricular wall thickness was not assessed. Right ventricular systolic function is normal. Tricuspid regurgitation signal is inadequate for assessing PA pressure. Left Atrium: Left atrial size was normal in size. Right Atrium: Right atrial size was normal in size. Pericardium: There is no evidence of pericardial effusion. Mitral Valve: The mitral valve is normal in structure. Mild mitral valve regurgitation. MV peak gradient, 8.8 mmHg. The mean mitral valve gradient is 2.0 mmHg. Tricuspid Valve: The tricuspid valve is normal in structure. Tricuspid valve regurgitation is trivial. Aortic Valve: The aortic valve has an indeterminant number of cusps. Aortic valve regurgitation is not visualized. Mild aortic valve sclerosis is present, with no evidence of aortic valve stenosis. Pulmonic Valve: The pulmonic valve was grossly normal. Pulmonic  valve regurgitation is not visualized. Aorta: The aortic root and ascending aorta are structurally normal, with no evidence of dilitation. Venous: The inferior vena cava is dilated in size with less than 50% respiratory variability, suggesting right atrial pressure of 15 mmHg. IAS/Shunts: The interatrial septum was not well visualized.  LEFT VENTRICLE PLAX 2D LVIDd:         4.60 cm      Diastology LVIDs:         4.10 cm      LV e' lateral:   5.66 cm/s LV PW:         1.30 cm      LV E/e' lateral: 21.7 LV IVS:        1.20 cm      LV e' medial:    5.11 cm/s LVOT diam:     1.80 cm      LV E/e' medial:  24.1 LV SV:         66 LV SV Index:   35 LVOT Area:     2.54 cm  LV Volumes (MOD) LV vol d, MOD A2C: 126.0 ml LV vol d, MOD A4C: 113.0 ml LV vol s, MOD A2C: 86.6 ml LV vol s, MOD A4C: 86.6 ml LV SV MOD A2C:     39.4 ml LV SV MOD A4C:     113.0 ml LV SV MOD BP:      34.3 ml RIGHT VENTRICLE             IVC RV S prime:     12.10 cm/s  IVC diam: 2.80 cm TAPSE (M-mode): 2.5 cm LEFT ATRIUM  Index       RIGHT ATRIUM           Index LA diam:        3.60 cm 1.89 cm/m  RA Area:     12.90 cm LA Vol (A2C):   46.0 ml 24.12 ml/m RA Volume:   29.90 ml  15.68 ml/m LA Vol (A4C):   50.8 ml 26.64 ml/m LA Biplane Vol: 50.4 ml 26.43 ml/m  AORTIC VALVE             PULMONIC VALVE LVOT Vmax:   135.00 cm/s PR End Diast Vel: 1.75 msec LVOT Vmean:  88.400 cm/s LVOT VTI:    0.260 m  AORTA Ao Root diam: 2.90 cm Ao Asc diam:  3.00 cm MITRAL VALVE MV Area (PHT): 6.54 cm      SHUNTS MV Peak grad:  8.8 mmHg      Systemic VTI:  0.26 m MV Mean grad:  2.0 mmHg      Systemic Diam: 1.80 cm MV Vmax:       1.48 m/s MV Vmean:      62.9 cm/s MV Decel Time: 116 msec MR Peak grad:    91.0 mmHg MR Mean grad:    61.0 mmHg MR Vmax:         477.00 cm/s MR Vmean:        366.0 cm/s MR PISA:         2.26 cm MR PISA Eff ROA: 15 mm MR PISA Radius:  0.60 cm MV E velocity: 123.00 cm/s Oswaldo Milian MD Electronically signed by Oswaldo Milian  MD Signature Date/Time: 02/17/2020/3:35:22 PM    Final         Scheduled Meds: . amantadine  100 mg Oral TID  . Carbidopa-Levodopa ER  1 tablet Oral 6 X Daily  . citalopram  20 mg Oral Daily  . enoxaparin (LOVENOX) injection  40 mg Subcutaneous Q24H  . fluticasone  1 spray Each Nare Daily  . fluticasone furoate-vilanterol  1 puff Inhalation Daily  . furosemide  20 mg Intravenous Daily  . levothyroxine  112 mcg Oral Daily  . linaclotide  290 mcg Oral Daily  . metoprolol tartrate  12.5 mg Oral BID  . mometasone-formoterol  2 puff Inhalation BID  . montelukast  10 mg Oral QHS  . multivitamin with minerals  2 tablet Oral Daily  . pantoprazole  40 mg Oral Daily  . pravastatin  40 mg Oral q1800  . sacubitril-valsartan  1 tablet Oral BID  . tamsulosin  0.4 mg Oral Daily    LOS: 2 days    Time spent: 30 minutes    Tyyne Cliett Darleen Crocker, DO [facility]  If 7PM-7AM, please contact night-coverage [email or web address] 02/19/2020, 9:54 AM

## 2020-02-20 LAB — BASIC METABOLIC PANEL
Anion gap: 8 (ref 5–15)
BUN: 15 mg/dL (ref 8–23)
CO2: 29 mmol/L (ref 22–32)
Calcium: 8.8 mg/dL — ABNORMAL LOW (ref 8.9–10.3)
Chloride: 101 mmol/L (ref 98–111)
Creatinine, Ser: 0.95 mg/dL (ref 0.44–1.00)
GFR calc Af Amer: >60 mL/min (ref >60)
GFR calc non Af Amer: >60 mL/min (ref >60)
Glucose, Bld: 88 mg/dL (ref 70–99)
Potassium: 3.6 mmol/L (ref 3.5–5.1)
Sodium: 138 mmol/L (ref 135–145)

## 2020-02-20 LAB — MAGNESIUM: Magnesium: 2.1 mg/dL (ref 1.7–2.4)

## 2020-02-20 LAB — BRAIN NATRIURETIC PEPTIDE: B Natriuretic Peptide: 376 pg/mL — ABNORMAL HIGH (ref 0.0–100.0)

## 2020-02-20 MED ORDER — SODIUM CHLORIDE 0.9% FLUSH
3.0000 mL | Freq: Two times a day (BID) | INTRAVENOUS | Status: DC
  Administered 2020-02-20: 3 mL via INTRAVENOUS

## 2020-02-20 MED ORDER — SPIRONOLACTONE 12.5 MG HALF TABLET
12.5000 mg | ORAL_TABLET | Freq: Every day | ORAL | Status: DC
  Administered 2020-02-20 – 2020-02-21 (×2): 12.5 mg via ORAL
  Filled 2020-02-20 (×2): qty 1

## 2020-02-20 MED ORDER — SODIUM CHLORIDE 0.9% FLUSH: 3.0000 mL | INTRAVENOUS | Status: DC | PRN

## 2020-02-20 MED ORDER — SODIUM CHLORIDE 0.9 % WEIGHT BASED INFUSION
3.0000 mL/kg/h | INTRAVENOUS | Status: DC
  Administered 2020-02-21: 3 mL/kg/h via INTRAVENOUS

## 2020-02-20 MED ORDER — SODIUM CHLORIDE 0.9 % IV SOLN: 250.0000 mL | INTRAVENOUS | Status: DC | PRN

## 2020-02-20 MED ORDER — ASPIRIN 81 MG PO CHEW
81.0000 mg | CHEWABLE_TABLET | ORAL | Status: AC
  Administered 2020-02-21: 81 mg via ORAL
  Filled 2020-02-20: qty 1

## 2020-02-20 MED ORDER — POTASSIUM CHLORIDE CRYS ER 20 MEQ PO TBCR
40.0000 meq | EXTENDED_RELEASE_TABLET | Freq: Once | ORAL | Status: AC
  Administered 2020-02-20: 40 meq via ORAL
  Filled 2020-02-20: qty 2

## 2020-02-20 MED ORDER — SODIUM CHLORIDE 0.9 % WEIGHT BASED INFUSION: 1.0000 mL/kg/h | INTRAVENOUS | Status: DC

## 2020-02-20 NOTE — Progress Notes (Addendum)
Progress Note  Patient Name: Rachel [name] Date of Encounter: 02/20/2020  Attending physician: Rodena Goldmann, DO Primary care provider: Marton Redwood, MD Primary Cardiologist: NA Consultant:Briannah Lona Terri Skains DO, [facility]  Subjective: Rachel [name] is a 69 y.o. female who was seen and examined at bedside at approximately 12:15pm  Patient states that her shortness of breath has improved since admission.  No longer requiring nasal cannula oxygen.  She denies chest pain at rest.  Patient was ambulate on the floor yesterday without effort related symptoms.  She is tolerating the initiation of Toprol-XL and Entresto well.  Patient still wants to proceed with left heart catheterization with possible intervention tomorrow.  Objective: Vital Signs in the last 24 hours: Temp:  [97.6 F (36.4 C)-98.3 F (36.8 C)] 98 F (36.7 C) (07/25 1128) Pulse Rate:  [58-69] 69 (07/25 1128) Resp:  [18] 18 (07/25 1128) BP: (99-146)/(50-81) 123/70 (07/25 1128) SpO2:  [94 %-98 %] 97 % (07/25 1128) Weight:  [80.6 kg] 80.6 kg (07/25 0436)  Intake/Output:  Intake/Output Summary (Last 24 hours) at 02/20/2020 1249 Last data filed at 02/20/2020 1100 Gross per 24 hour  Intake 480 ml  Output 1350 ml  Net -870 ml    Net IO Since Admission: -1,250 mL [02/20/20 1249]  Weights:  Filed Weights   02/18/20 0500 02/19/20 0059 02/20/20 0436  Weight: 90.2 kg 79.4 kg 80.6 kg    Telemetry: Normal sinus rhythm.  Physical examination: PHYSICAL EXAM: Vitals with BMI 02/20/2020 02/20/2020 02/20/2020  Height - - -  Weight - - -  BMI - - -  Systolic [phone]  Diastolic 70 64 60  Pulse 69 65 62    CONSTITUTIONAL: Frail-appearing elderly female, hemodynamically stable, No acute distress.  SKIN: Skin is warm and dry. No rash noted. No cyanosis. No pallor. No jaundice HEAD: Normocephalic and atraumatic.  EYES: No scleral icterus MOUTH/THROAT: Moist oral membranes.  NECK: no JVD . No thyromegaly noted. No carotid bruits   LYMPHATIC: No visible cervical adenopathy.  CHEST: No intercostal retractions.   Equal rise and fall of chest cavity LUNGS: Decreased breath sounds at the bases.  No stridor. No wheezes. No rales.  CARDIOVASCULAR: Regular, positive S1-S2, no gallops rubs or murmurs appreciated. ABDOMINAL: Soft, nontender, nondistended, no apparent ascites.  EXTREMITIES: No peripheral edema.  Warm to touch bilaterally.  2+ bilateral posterior tibial pulses. HEMATOLOGIC: No significant bruising NEUROLOGIC: Oriented to person, place, and time. Nonfocal. Normal muscle tone.  PSYCHIATRIC: Normal mood and affect. Normal behavior. Cooperative  Lab Results: Hematology Recent Labs  Lab 02/17/20 0021 02/17/20 0021 02/17/20 0256 02/18/20 0602 02/18/20 1609 02/19/20 0418  WBC 7.6  --   --  7.7  --  7.5  RBC 4.28   < >  --  3.67* 4.01 3.88  HGB 12.2   < > 12.6 10.5*  --  11.5*  HCT 39.9   < > 37.0 33.6*  --  35.4*  MCV 93.2  --   --  91.6  --  91.2  MCH 28.5  --   --  28.6  --  29.6  MCHC 30.6  --   --  31.3  --  32.5  RDW 13.6  --   --  13.7  --  13.6  PLT 311  --   --  245  --  267   < > = values in this interval not displayed.    Chemistry Recent Labs  Lab 02/18/20 0602 02/19/20 0418 02/20/20 0615  NA  139 139 138  K 3.5 3.4* 3.6  CL 102 100 101  CO2 29 28 29   GLUCOSE 105* 114* 88  BUN 16 21 15   CREATININE 0.94 1.10* 0.95  CALCIUM 9.1 8.8* 8.8*  GFRNONAA >60 51* >60  GFRAA >60 59* >60  ANIONGAP 8 11 8      Cardiac Enzymes: Cardiac Panel (last 3 results) No results for input(s): CKTOTAL, CKMB, TROPONINIHS, RELINDX in the last 72 hours.  BNP (last 3 results) Recent Labs    02/17/20 0021  BNP 484.2*    ProBNP (last 3 results) No results for input(s): PROBNP in the last 8760 hours.  DDimer  Recent Labs  Lab 02/17/20 1900  DDIMER 0.68*     Hemoglobin A1c: No results found for: HGBA1C, MPG  TSH  Recent Labs    02/18/20 0602  TSH 1.862    Lipid Panel     Component  Value Date/Time   CHOL 236 (H) 02/18/2020 0602   TRIG 47 02/18/2020 0602   HDL 65 02/18/2020 0602   CHOLHDL 3.6 02/18/2020 0602   VLDL 9 02/18/2020 0602   LDLCALC 162 (H) 02/18/2020 0602   Imaging: No results found.  Cardiac database: EKG: 02/17/2020 @ 00:16: Sinus tachycardia, 112 bpm, left bundle branch block, ST-T changes most likely secondary to LBBB, poor R wave progression, no significant change compared to prior ECG.  02/19/2020: Normal sinus rhythm, 66 bpm, LVH, probable old anteroseptal infarct, ST-T changes in the high lateral and lateral leads suggestive of possible anterolateral ischemia, QTC 494 ms, compared to prior EKG left bundle branch block is absent.  Echocardiogram: 02/17/2020: LVEF 30-35%, moderately decreased LVEF, global hypokinesis, abnormal septal motion consistent with LBBB, LV size mildly dilated, mild LVH, grade 2 diastolic impairment, mild MR, aortic valve sclerosis without stenosis.  Stress Testing:  2013, per patient but results available to review.   Heart Catheterization: None  Scheduled Meds: . amantadine  100 mg Oral TID  . Carbidopa-Levodopa ER  1 tablet Oral 6 X Daily  . enoxaparin (LOVENOX) injection  40 mg Subcutaneous Q24H  . fluticasone  1 spray Each Nare Daily  . fluticasone furoate-vilanterol  1 puff Inhalation Daily  . levothyroxine  112 mcg Oral Daily  . linaclotide  290 mcg Oral Daily  . magnesium oxide  400 mg Oral BID  . metoprolol succinate  25 mg Oral Daily  . mometasone-formoterol  2 puff Inhalation BID  . montelukast  10 mg Oral QHS  . multivitamin with minerals  2 tablet Oral Daily  . pantoprazole  40 mg Oral Daily  . pravastatin  40 mg Oral q1800  . sacubitril-valsartan  1 tablet Oral BID  . tamsulosin  0.4 mg Oral Daily    Continuous Infusions:   PRN Meds: acetaminophen, albuterol   IMPRESSION & RECOMMENDATIONS: Rachel [name] is a 69 y.o. female whose past medical history and cardiac risk factors include: New  diagnosis of cardiomyopathy etiology unknown, acute systolic and diastolic heart failure/stage C/NYHA class II, multiple system atrophy, asthma, history of left lower extremity DVT, hypertension, hyperlipidemia, hypothyroidism, left bundle branch block, advanced age, postmenopausal female.  Newly diagnosed combined systolic and diastolic heart failure, stage C, NYHA class II:  Clinically has improved since admission.  Continue Entresto 24/26 mg p.o. twice daily.   Continue Toprol-XL 25 mg p.o. every morning.  Start Aldactone 12.5 mg p.o. every morning.    We will continue to uptitrate guideline directed medical therapy as hemodynamics and laboratory values allow.  Recommend strict I's and O's and daily weights.  Echocardiogram results noted above.  Newly discovered cardiomyopathy: See above, etiology unknown.  Elevated troponin most likely secondary to type II MI from supply demand ischemia in the setting of underlying cardiomyopathy.    Patient did have a rise and fall in troponin with peak troponin 84, trending downward.    Patient's LVEF is moderately reduced which is a new finding for the patient and in addition to that has multiple cardiovascular risk factors.  We discussed ischemic evaluation and patient would like to proceed with left heart catheterization prior to discharge.  EKG from 02/19/2020 notes ST-T changes in the high lateral and lateral leads suggestive of possible underlying ischemia  Yesterday, I spoke to both the patient and her husband [name] over the phone regarding the left heart catheterization.  The left heart catheterization procedure was explained to the patient in detail. The indication, alternatives, risks and benefits were reviewed. Complications including but not limited to bleeding, infection, acute kidney injury, blood transfusion, heart rhythm disturbances, contrast (dye) reaction, damage to the arteries or nerves in the legs or hands,  cerebrovascular accident, myocardial infarction, need for emergent bypass surgery, blood clots in the legs, possible need for emergent blood transfusion, and rarely death were reviewed and discussed with the patient. The patient and husband voices understanding and wishes to proceed.   Tentatively scheduled for Monday, February 21, 2020  Hyperlipidemia:  Started on pravastatin on 02/18/2020.  Patient tolerated the medication well without any side effects or intolerances.  Will need follow-up lipid profile as outpatient.  Prolonged QT on telemetry:  Patient is asymptomatic.  EKG reviewed, findings noted above  Recommend potassium of 4 and magnesium 2.  Also discussed with attending physician regards to reviewing current medications avoid QT prolonging drugs if possible.  Continue magnesium oxide 400 mg p.o. twice daily.  Continue to monitor  Acute hypoxic respiratory failure: Resolved  Patient presented to the hospital with acute hypoxia and respiratory distress requiring BiPAP intervention and now currently requiring nasal cannula oxygen.    Secondary to community-acquired pneumonia, newly discovered cardiomyopathy, and acute systolic/diastolic heart failure.    D-dimer positive, subsequently underwent CT angio PE protocol negative for pulmonary embolism  Patient has been treated with antibiotics given her underlying pneumonia.  She is now not requiring nasal cannula oxygen.  Continue to monitor  Other: Benign essential hypertension: Management as per your care Left bundle branch block:  Not present on EKG dated 02/19/2020. Continue to monitor. History of left lower extremity DVT. Hypothyroidism: Continue management as per your care Community acquired pneumonia, right lower lobe: Management as per your care  Patient's questions and concerns were addressed to her satisfaction. She voices understanding of the instructions provided during this encounter.   This note was created  using a voice recognition software as a result there may be grammatical errors inadvertently enclosed that do not reflect the nature of this encounter. Every attempt is made to correct such errors.  Rex Kras, DO, [facility]. [place] Office: [phone] 02/20/2020, 12:49 PM

## 2020-02-20 NOTE — H&P (View-Only) (Signed)
Progress Note  Patient Name: Rachel [name] Date of Encounter: 02/20/2020  Attending physician: Rachel Goldmann, DO Primary care provider: Marton Redwood, MD Primary Cardiologist: NA Consultant:Rachel [name] Rachel Skains DO, [facility]  Subjective: Rachel [name] is a 69 y.o. female who was seen and examined at bedside at approximately 12:15pm  Patient states that her shortness of breath has improved since admission.  No longer requiring nasal cannula oxygen.  She denies chest pain at rest.  Patient was ambulate on the floor yesterday without effort related symptoms.  She is tolerating the initiation of Toprol-XL and Entresto well.  Patient still wants to proceed with left heart catheterization with possible intervention tomorrow.  Objective: Vital Signs in the last 24 hours: Temp:  [97.6 F (36.4 C)-98.3 F (36.8 C)] 98 F (36.7 C) (07/25 1128) Pulse Rate:  [58-69] 69 (07/25 1128) Resp:  [18] 18 (07/25 1128) BP: (99-146)/(50-81) 123/70 (07/25 1128) SpO2:  [94 %-98 %] 97 % (07/25 1128) Weight:  [80.6 kg] 80.6 kg (07/25 0436)  Intake/Output:  Intake/Output Summary (Last 24 hours) at 02/20/2020 1249 Last data filed at 02/20/2020 1100 Gross per 24 hour  Intake 480 ml  Output 1350 ml  Net -870 ml    Net IO Since Admission: -1,250 mL [02/20/20 1249]  Weights:  Filed Weights   02/18/20 0500 02/19/20 0059 02/20/20 0436  Weight: 90.2 kg 79.4 kg 80.6 kg    Telemetry: Normal sinus rhythm.  Physical examination: PHYSICAL EXAM: Vitals with BMI 02/20/2020 02/20/2020 02/20/2020  Height - - -  Weight - - -  BMI - - -  Systolic 017 793 903  Diastolic 70 64 60  Pulse 69 65 62    CONSTITUTIONAL: Frail-appearing elderly female, hemodynamically stable, No acute distress.  SKIN: Skin is warm and dry. No rash noted. No cyanosis. No pallor. No jaundice HEAD: Normocephalic and atraumatic.  EYES: No scleral icterus MOUTH/THROAT: Moist oral membranes.  NECK: no JVD . No thyromegaly noted. No carotid bruits   LYMPHATIC: No visible cervical adenopathy.  CHEST: No intercostal retractions.   Equal rise and fall of chest cavity LUNGS: Decreased breath sounds at the bases.  No stridor. No wheezes. No rales.  CARDIOVASCULAR: Regular, positive S1-S2, no gallops rubs or murmurs appreciated. ABDOMINAL: Soft, nontender, nondistended, no apparent ascites.  EXTREMITIES: No peripheral edema.  Warm to touch bilaterally.  2+ bilateral posterior tibial pulses. HEMATOLOGIC: No significant bruising NEUROLOGIC: Oriented to person, place, and time. Nonfocal. Normal muscle tone.  PSYCHIATRIC: Normal mood and affect. Normal behavior. Cooperative  Lab Results: Hematology Recent Labs  Lab 02/17/20 0021 02/17/20 0021 02/17/20 0256 02/18/20 0602 02/18/20 1609 02/19/20 0418  WBC 7.6  --   --  7.7  --  7.5  RBC 4.28   < >  --  3.67* 4.01 3.88  HGB 12.2   < > 12.6 10.5*  --  11.5*  HCT 39.9   < > 37.0 33.6*  --  35.4*  MCV 93.2  --   --  91.6  --  91.2  MCH 28.5  --   --  28.6  --  29.6  MCHC 30.6  --   --  31.3  --  32.5  RDW 13.6  --   --  13.7  --  13.6  PLT 311  --   --  245  --  267   < > = values in this interval not displayed.    Chemistry Recent Labs  Lab 02/18/20 0602 02/19/20 0418 02/20/20 0615  NA  139 139 138  K 3.5 3.4* 3.6  CL 102 100 101  CO2 29 28 29   GLUCOSE 105* 114* 88  BUN 16 21 15   CREATININE 0.94 1.10* 0.95  CALCIUM 9.1 8.8* 8.8*  GFRNONAA >60 51* >60  GFRAA >60 59* >60  ANIONGAP 8 11 8      Cardiac Enzymes: Cardiac Panel (last 3 results) No results for input(s): CKTOTAL, CKMB, TROPONINIHS, RELINDX in the last 72 hours.  BNP (last 3 results) Recent Labs    02/17/20 0021  BNP 484.2*    ProBNP (last 3 results) No results for input(s): PROBNP in the last 8760 hours.  DDimer  Recent Labs  Lab 02/17/20 1900  DDIMER 0.68*     Hemoglobin A1c: No results found for: HGBA1C, MPG  TSH  Recent Labs    02/18/20 0602  TSH 1.862    Lipid Panel     Component  Value Date/Time   CHOL 236 (H) 02/18/2020 0602   TRIG 47 02/18/2020 0602   HDL 65 02/18/2020 0602   CHOLHDL 3.6 02/18/2020 0602   VLDL 9 02/18/2020 0602   LDLCALC 162 (H) 02/18/2020 0602   Imaging: No results found.  Cardiac database: EKG: 02/17/2020 @ 00:16: Sinus tachycardia, 112 bpm, left bundle branch block, ST-T changes most likely secondary to LBBB, poor R wave progression, no significant change compared to prior ECG.  02/19/2020: Normal sinus rhythm, 66 bpm, LVH, probable old anteroseptal infarct, ST-T changes in the high lateral and lateral leads suggestive of possible anterolateral ischemia, QTC 494 ms, compared to prior EKG left bundle branch block is absent.  Echocardiogram: 02/17/2020: LVEF 30-35%, moderately decreased LVEF, global hypokinesis, abnormal septal motion consistent with LBBB, LV size mildly dilated, mild LVH, grade 2 diastolic impairment, mild MR, aortic valve sclerosis without stenosis.  Stress Testing:  2013, per patient but results available to review.   Heart Catheterization: None  Scheduled Meds: . amantadine  100 mg Oral TID  . Carbidopa-Levodopa ER  1 tablet Oral 6 X Daily  . enoxaparin (LOVENOX) injection  40 mg Subcutaneous Q24H  . fluticasone  1 spray Each Nare Daily  . fluticasone furoate-vilanterol  1 puff Inhalation Daily  . levothyroxine  112 mcg Oral Daily  . linaclotide  290 mcg Oral Daily  . magnesium oxide  400 mg Oral BID  . metoprolol succinate  25 mg Oral Daily  . mometasone-formoterol  2 puff Inhalation BID  . montelukast  10 mg Oral QHS  . multivitamin with minerals  2 tablet Oral Daily  . pantoprazole  40 mg Oral Daily  . pravastatin  40 mg Oral q1800  . sacubitril-valsartan  1 tablet Oral BID  . tamsulosin  0.4 mg Oral Daily    Continuous Infusions:   PRN Meds: acetaminophen, albuterol   IMPRESSION & RECOMMENDATIONS: Rachel [name] is a 69 y.o. female whose past medical history and cardiac risk factors include: New  diagnosis of cardiomyopathy etiology unknown, acute systolic and diastolic heart failure/stage C/NYHA class II, multiple system atrophy, asthma, history of left lower extremity DVT, hypertension, hyperlipidemia, hypothyroidism, left bundle branch block, advanced age, postmenopausal female.  Newly diagnosed combined systolic and diastolic heart failure, stage C, NYHA class II:  Clinically has improved since admission.  Continue Entresto 24/26 mg p.o. twice daily.   Continue Toprol-XL 25 mg p.o. every morning.  Start Aldactone 12.5 mg p.o. every morning.    We will continue to uptitrate guideline directed medical therapy as hemodynamics and laboratory values allow.  Recommend strict I's and O's and daily weights.  Echocardiogram results noted above.  Newly discovered cardiomyopathy: See above, etiology unknown.  Elevated troponin most likely secondary to type II MI from supply demand ischemia in the setting of underlying cardiomyopathy.    Patient did have a rise and fall in troponin with peak troponin 84, trending downward.    Patient's LVEF is moderately reduced which is a new finding for the patient and in addition to that has multiple cardiovascular risk factors.  We discussed ischemic evaluation and patient would like to proceed with left heart catheterization prior to discharge.  EKG from 02/19/2020 notes ST-T changes in the high lateral and lateral leads suggestive of possible underlying ischemia  Yesterday, I spoke to both the patient and her husband [name] over the phone regarding the left heart catheterization.  The left heart catheterization procedure was explained to the patient in detail. The indication, alternatives, risks and benefits were reviewed. Complications including but not limited to bleeding, infection, acute kidney injury, blood transfusion, heart rhythm disturbances, contrast (dye) reaction, damage to the arteries or nerves in the legs or hands,  cerebrovascular accident, myocardial infarction, need for emergent bypass surgery, blood clots in the legs, possible need for emergent blood transfusion, and rarely death were reviewed and discussed with the patient. The patient and husband voices understanding and wishes to proceed.   Tentatively scheduled for Monday, February 21, 2020  Hyperlipidemia:  Started on pravastatin on 02/18/2020.  Patient tolerated the medication well without any side effects or intolerances.  Will need follow-up lipid profile as outpatient.  Prolonged QT on telemetry:  Patient is asymptomatic.  EKG reviewed, findings noted above  Recommend potassium of 4 and magnesium 2.  Also discussed with attending physician regards to reviewing current medications avoid QT prolonging drugs if possible.  Continue magnesium oxide 400 mg p.o. twice daily.  Continue to monitor  Acute hypoxic respiratory failure: Resolved  Patient presented to the hospital with acute hypoxia and respiratory distress requiring BiPAP intervention and now currently requiring nasal cannula oxygen.    Secondary to community-acquired pneumonia, newly discovered cardiomyopathy, and acute systolic/diastolic heart failure.    D-dimer positive, subsequently underwent CT angio PE protocol negative for pulmonary embolism  Patient has been treated with antibiotics given her underlying pneumonia.  She is now not requiring nasal cannula oxygen.  Continue to monitor  Other: Benign essential hypertension: Management as per your care Left bundle branch block:  Not present on EKG dated 02/19/2020. Continue to monitor. History of left lower extremity DVT. Hypothyroidism: Continue management as per your care Community acquired pneumonia, right lower lobe: Management as per your care  Patient's questions and concerns were addressed to her satisfaction. She voices understanding of the instructions provided during this encounter.   This note was created  using a voice recognition software as a result there may be grammatical errors inadvertently enclosed that do not reflect the nature of this encounter. Every attempt is made to correct such errors.  Rex Kras, DO, [facility]. [place] Office: [phone] 02/20/2020, 12:49 PM

## 2020-02-20 NOTE — Progress Notes (Signed)
PROGRESS NOTE    Rachel [name]  FTD:[id number] DOB: 12-May-1951 DOA: 02/17/2020 PCP: Marton Redwood, MD   Brief Narrative:   This is a 69 year old female with history of multiple system atrophy/parkinsonism as well as asthma. She was initially diagnosed with community-acquired pneumonia of the right lower lobe of her lung, but she was not noted to have any significant leukocytosis, fevers, or chills. She was started on antibiotic treatment with Rocephin and azithromycin which was then changed over to doxycycline. She was noted to have significant cardiomyopathy with LV dysfunction and EF 30-35% on 2D echocardiogram yesterday. EKG with left bundle branch block. She denied any chest pain and troponin trend had remained flat. Cardiology evaluation performed and patient was started on some gentle IV diuresis with Lasix.  -Plans for cardiac catheterization per cardiology in a.m.  Assessment & Plan:   Principal Problem:   Community acquired pneumonia of right lower lobe of lung Active Problems:   Multiple system atrophy, 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HCC)   Mild intermittent asthma with (acute) exacerbation   Acute respiratory failure with hypoxia (HCC)   Acute combined systolic and diastolic heart failure (HCC)   Class 1 obesity due to excess calories with serious comorbidity and body mass index (BMI) of 34.0 to 34.9 in adult   Prolonged Q-T interval on ECG    Acute hypoxemic respiratory failure likely secondary to newly diagnosed acute on chronic systolic and diastolic congestive heart failure-improved -Patient does not appear to have true pneumonia and procalcitonin is low -She is noting significant improvement today after some diuresis, and therefore I believe much of her symptoms are arising from her cardiomyopathy and congestive heart failure -Rocephin and doxycycline discontinued on 7/23 -D-dimer was elevated and PE study performed which was negative -Further Lasix diuresis held and patient  started on Aldactone -Daily weights and strict I's and O's, weight is currently measured at 177 pounds, 570 mL negative fluid balance in the last 24 hours -Wean oxygen as tolerated  Newly discovered cardiomyopathy with acute CHF-combined as noted above -Entresto 24/26 mg p.o. twice daily started by cardiology -Continue on metoprolol 12.5 mg twice daily as prescribed 7/22 -Lasix pushes for diuresis as noted above -Monitor daily weights and I's and O's -Started on Aldactone per cardiology on 7/25  Elevated troponin-likely demand ischemia -Patient also has LBBB and is a candidate for cardiac catheterization which has been proposed by cardiology. -Patient agreeable to cardiac catheterization which will be performed 7/26 -N.p.o. after midnight  Essential hypertension-controlled -Continue current medications and monitor carefully with initiation of Entresto -Holding parameters for medications initiated due to some soft blood pressure readings this a.m.  Prolonged QTC -Appears improved on EKG on 7/24 with QTC 494 MS -Home Celexa discontinued for now -Supplement more potassium for goal of 4, magnesium of 2.1  Dyslipidemia -Started on pravastatin 40 mg nightly by cardiology -Tolerating well  Hypothyroidism -Continue Synthroid -TSH within normal limits  Multiple system atrophy/Parkinson's -Follows with neurology Dr. Carles Collet -Continue on Sinemet  Obesity -Lifestyle changes  Anemia-stable -No overt bleeding -Anemia panel without significant findings -Stool occult pending  DVT prophylaxis:Lovenox Code Status:Full Family Communication:Discussed with husband on phone 7/25 Disposition Plan:  Status is: Inpatient  Remains inpatient appropriate because:IV treatments appropriate due to intensity of illness or inability to take PO and Inpatient level of care appropriate due to severity of illness   Dispo: The patient is from:Home Anticipated d/c is  CW:CBJS Anticipated d/c date is: 1-2 days Patient currently is not medically stable to d/c.  Plans are for cardiac catheterization on 7/26.  Consultants:  Cardiology  Procedures:  See below  Antimicrobials:  Anti-infectives (From admission, onward)   Start     Dose/Rate Route Frequency Ordered Stop   02/18/20 0600  cefTRIAXone (ROCEPHIN) 2 g in sodium chloride 0.9 % 100 mL IVPB  Status:  Discontinued        2 g 200 mL/hr over 30 Minutes Intravenous Every 24 hours 02/17/20 0310 02/18/20 1102   02/18/20 0600  azithromycin (ZITHROMAX) 500 mg in sodium chloride 0.9 % 250 mL IVPB  Status:  Discontinued        500 mg 250 mL/hr over 60 Minutes Intravenous Every 24 hours 02/17/20 0310 02/17/20 1348   02/17/20 2000  doxycycline (VIBRA-TABS) tablet 100 mg  Status:  Discontinued        100 mg Oral Every 12 hours 02/17/20 1348 02/18/20 1102   02/17/20 0130  cefTRIAXone (ROCEPHIN) 1 g in sodium chloride 0.9 % 100 mL IVPB        1 g 200 mL/hr over 30 Minutes Intravenous  Once 02/17/20 0125 02/17/20 0257   02/17/20 0130  azithromycin (ZITHROMAX) 500 mg in sodium chloride 0.9 % 250 mL IVPB        500 mg 250 mL/hr over 60 Minutes Intravenous  Once 02/17/20 0125 02/17/20 0346       Subjective: Patient seen and evaluated today with some lightheadedness and lower blood pressures noted this morning.  No acute overnight events noted.  Objective: Vitals:   02/20/20 0840 02/20/20 1037 02/20/20 1128 02/20/20 1410  BP: (!) 123/60 (!) 114/64 123/70 (!) 104/54  Pulse: 62 65 69 64  Resp: 18  18 18   Temp:   98 F (36.7 C)   TempSrc:   Oral   SpO2: 96% 96% 97% 98%  Weight:      Height:        Intake/Output Summary (Last 24 hours) at 02/20/2020 1421 Last data filed at 02/20/2020 1100 Gross per 24 hour  Intake 480 ml  Output 1050 ml  Net -570 ml   Filed Weights   02/18/20 0500 02/19/20 0059 02/20/20 0436  Weight: 90.2 kg 79.4 kg 80.6 kg     Examination:  General exam: Appears calm and comfortable  Respiratory system: Clear to auscultation. Respiratory effort normal. Cardiovascular system: S1 & S2 heard, RRR. No JVD, murmurs, rubs, gallops or clicks. No pedal edema. Gastrointestinal system: Abdomen is nondistended, soft and nontender. No organomegaly or masses felt. Normal bowel sounds heard. Central nervous system: Alert and oriented. No focal neurological deficits. Extremities: Symmetric 5 x 5 power. Skin: No rashes, lesions or ulcers Psychiatry: Judgement and insight appear normal. Mood & affect appropriate.     Data Reviewed: I have personally reviewed following labs and imaging studies  CBC: Recent Labs  Lab 02/17/20 0021 02/17/20 0256 02/18/20 0602 02/19/20 0418  WBC 7.6  --  7.7 7.5  NEUTROABS 3.8  --   --   --   HGB 12.2 12.6 10.5* 11.5*  HCT 39.9 37.0 33.6* 35.4*  MCV 93.2  --  91.6 91.2  PLT 311  --  245 454   Basic Metabolic Panel: Recent Labs  Lab 02/17/20 0021 02/17/20 0256 02/18/20 0602 02/19/20 0418 02/20/20 0615  NA 141 143 139 139 138  K 3.5 3.3* 3.5 3.4* 3.6  CL 103  --  102 100 101  CO2 27  --  29 28 29   GLUCOSE 151*  --  105* 114* 88  BUN 13  --  16 21 15   CREATININE 1.05*  --  0.94 1.10* 0.95  CALCIUM 9.4  --  9.1 8.8* 8.8*  MG  --   --  2.0 1.8 2.1   GFR: Estimated Creatinine Clearance: 56.6 mL/min (by C-G formula based on SCr of 0.95 mg/dL). Liver Function Tests: No results for input(s): AST, ALT, ALKPHOS, BILITOT, PROT, ALBUMIN in the last 168 hours. No results for input(s): LIPASE, AMYLASE in the last 168 hours. No results for input(s): AMMONIA in the last 168 hours. Coagulation Profile: No results for input(s): INR, PROTIME in the last 168 hours. Cardiac Enzymes: No results for input(s): CKTOTAL, CKMB, CKMBINDEX, TROPONINI in the last 168 hours. BNP (last 3 results) No results for input(s): PROBNP in the last 8760 hours. HbA1C: No results for input(s): HGBA1C in  the last 72 hours. CBG: No results for input(s): GLUCAP in the last 168 hours. Lipid Profile: Recent Labs    02/18/20 0602  CHOL 236*  HDL 65  LDLCALC 162*  TRIG 47  CHOLHDL 3.6   Thyroid Function Tests: Recent Labs    02/18/20 0602  TSH 1.862   Anemia Panel: Recent Labs    02/18/20 1609  VITAMINB12 194  FOLATE 19.7  FERRITIN 42  TIBC 385  IRON 34  RETICCTPCT 1.5   Sepsis Labs: Recent Labs  Lab 02/17/20 0135 02/18/20 0602  PROCALCITON  --  0.12  LATICACIDVEN 1.2  --     Recent Results (from the past 240 hour(s))  SARS Coronavirus 2 by RT PCR (hospital order, performed in [place] hospital lab) Nasopharyngeal Nasopharyngeal Swab     Status: None   Collection Time: 02/17/20 12:21 AM   Specimen: Nasopharyngeal Swab  Result Value Ref Range Status   SARS Coronavirus 2 NEGATIVE NEGATIVE Final    Comment: (NOTE) SARS-CoV-2 target nucleic acids are NOT DETECTED.  The SARS-CoV-2 RNA is generally detectable in upper and lower respiratory specimens during the acute phase of infection. The lowest concentration of SARS-CoV-2 viral copies this assay can detect is 250 copies / mL. A negative result does not preclude SARS-CoV-2 infection and should not be used as the sole basis for treatment or other patient management decisions.  A negative result may occur with improper specimen collection / handling, submission of specimen other than nasopharyngeal swab, presence of viral mutation(s) within the areas targeted by this assay, and inadequate number of viral copies (<250 copies / mL). A negative result must be combined with clinical observations, patient history, and epidemiological information.  Fact Sheet for Patients:   StrictlyIdeas.no  Fact Sheet for Healthcare Providers: BankingDealers.co.za  This test is not yet approved or  cleared by the [place] FDA and has been authorized for detection and/or diagnosis  of SARS-CoV-2 by FDA under an Emergency Use Authorization (EUA).  This EUA will remain in effect (meaning this test can be used) for the duration of the COVID-19 declaration under Section 564(b)(1) of the Act, 21 U.S.C. section 360bbb-3(b)(1), unless the authorization is terminated or revoked sooner.  Performed at [facility], [place] [address] [place], [place] [address]   Culture, blood (Routine X 2) w Reflex to ID Panel     Status: None (Preliminary result)   Collection Time: 02/17/20  1:35 AM   Specimen: BLOOD  Result Value Ref Range Status   Specimen Description BLOOD RIGHT ANTECUBITAL  Final   Special Requests   Final    BOTTLES DRAWN AEROBIC AND ANAEROBIC Blood Culture  adequate volume   Culture   Final    NO GROWTH 3 DAYS Performed at [facility], [place] [address] [place], [place] [address]    Report Status PENDING  Incomplete  Culture, blood (Routine X 2) w Reflex to ID Panel     Status: None (Preliminary result)   Collection Time: 02/17/20  2:22 AM   Specimen: BLOOD  Result Value Ref Range Status   Specimen Description BLOOD SITE NOT SPECIFIED  Final   Special Requests   Final    BOTTLES DRAWN AEROBIC AND ANAEROBIC Blood Culture results may not be optimal due to an excessive volume of blood received in culture bottles   Culture   Final    NO GROWTH 3 DAYS Performed at [facility], [place] [address] [place], [place] [address]    Report Status PENDING  Incomplete         Radiology Studies: No results found.      Scheduled Meds: . amantadine  100 mg Oral TID  . Carbidopa-Levodopa ER  1 tablet Oral 6 X Daily  . enoxaparin (LOVENOX) injection  40 mg Subcutaneous Q24H  . fluticasone  1 spray Each Nare Daily  . fluticasone furoate-vilanterol  1 puff Inhalation Daily  . levothyroxine  112 mcg Oral Daily  . linaclotide  290 mcg Oral Daily  . magnesium oxide  400 mg Oral BID  . metoprolol succinate  25 mg Oral Daily  . mometasone-formoterol  2  puff Inhalation BID  . montelukast  10 mg Oral QHS  . multivitamin with minerals  2 tablet Oral Daily  . pantoprazole  40 mg Oral Daily  . pravastatin  40 mg Oral q1800  . sacubitril-valsartan  1 tablet Oral BID  . spironolactone  12.5 mg Oral Daily  . tamsulosin  0.4 mg Oral Daily    LOS: 3 days    Time spent: 30 minutes    Ellijah Leffel Darleen Crocker, DO [facility]  If 7PM-7AM, please contact night-coverage [email or web address] 02/20/2020, 2:21 PM

## 2020-02-21 ENCOUNTER — Telehealth: Payer: Self-pay | Admitting: Cardiology

## 2020-02-21 ENCOUNTER — Encounter (HOSPITAL_COMMUNITY)
Admission: EM | Disposition: A | Discharge status: Home / Self Care | Payer: Self-pay | Source: Home / Self Care | Attending: Internal Medicine

## 2020-02-21 ENCOUNTER — Encounter (HOSPITAL_COMMUNITY): Payer: Self-pay | Admitting: Cardiology

## 2020-02-21 HISTORY — PX: LEFT HEART CATH AND CORONARY ANGIOGRAPHY: CATH118249

## 2020-02-21 LAB — BASIC METABOLIC PANEL
Anion gap: 6 (ref 5–15)
BUN: 18 mg/dL (ref 8–23)
CO2: 31 mmol/L (ref 22–32)
Calcium: 8.7 mg/dL — ABNORMAL LOW (ref 8.9–10.3)
Chloride: 99 mmol/L (ref 98–111)
Creatinine, Ser: 1.41 mg/dL — ABNORMAL HIGH (ref 0.44–1.00)
GFR calc Af Amer: 44 mL/min — ABNORMAL LOW (ref >60)
GFR calc non Af Amer: 38 mL/min — ABNORMAL LOW (ref >60)
Glucose, Bld: 87 mg/dL (ref 70–99)
Potassium: 3.8 mmol/L (ref 3.5–5.1)
Sodium: 136 mmol/L (ref 135–145)

## 2020-02-21 LAB — MAGNESIUM: Magnesium: 2 mg/dL (ref 1.7–2.4)

## 2020-02-21 SURGERY — LEFT HEART CATH AND CORONARY ANGIOGRAPHY
Anesthesia: LOCAL

## 2020-02-21 MED ORDER — FENTANYL CITRATE (PF) 100 MCG/2ML IJ SOLN
INTRAMUSCULAR | Status: DC | PRN
  Administered 2020-02-21: 25 ug via INTRAVENOUS

## 2020-02-21 MED ORDER — SODIUM CHLORIDE 0.9% FLUSH: 3.0000 mL | Freq: Two times a day (BID) | INTRAVENOUS | Status: DC

## 2020-02-21 MED ORDER — IOHEXOL 350 MG/ML SOLN
INTRAVENOUS | Status: DC | PRN
  Administered 2020-02-21: 15 mL

## 2020-02-21 MED ORDER — MIDAZOLAM HCL 2 MG/2ML IJ SOLN
INTRAMUSCULAR | Status: DC | PRN
  Administered 2020-02-21: 1 mg via INTRAVENOUS

## 2020-02-21 MED ORDER — SODIUM CHLORIDE 0.9 % IV BOLUS: 500.0000 mL | Freq: Once | INTRAVENOUS | Status: DC

## 2020-02-21 MED ORDER — METOPROLOL SUCCINATE ER 50 MG PO TB24
50.0000 mg | ORAL_TABLET | Freq: Every day | ORAL | Status: DC
  Administered 2020-02-21: 50 mg via ORAL
  Filled 2020-02-21: qty 1

## 2020-02-21 MED ORDER — HEPARIN (PORCINE) IN NACL 1000-0.9 UT/500ML-% IV SOLN
INTRAVENOUS | Status: DC | PRN
  Administered 2020-02-21 (×2): 500 mL

## 2020-02-21 MED ORDER — SODIUM CHLORIDE 0.9 % IV SOLN: 250.0000 mL | INTRAVENOUS | Status: DC | PRN

## 2020-02-21 MED ORDER — LIVING BETTER WITH HEART FAILURE BOOK: Status: DC

## 2020-02-21 MED ORDER — SODIUM CHLORIDE 0.9% FLUSH: 3.0000 mL | INTRAVENOUS | Status: DC | PRN

## 2020-02-21 MED ORDER — HEPARIN (PORCINE) IN NACL 1000-0.9 UT/500ML-% IV SOLN
INTRAVENOUS | Status: AC
  Filled 2020-02-21: qty 1000

## 2020-02-21 MED ORDER — FENTANYL CITRATE (PF) 100 MCG/2ML IJ SOLN
INTRAMUSCULAR | Status: AC
  Filled 2020-02-21: qty 2

## 2020-02-21 MED ORDER — LIDOCAINE HCL (PF) 1 % IJ SOLN
INTRAMUSCULAR | Status: AC
  Filled 2020-02-21: qty 30

## 2020-02-21 MED ORDER — MIDAZOLAM HCL 2 MG/2ML IJ SOLN
INTRAMUSCULAR | Status: AC
  Filled 2020-02-21: qty 2

## 2020-02-21 MED ORDER — PRAVASTATIN SODIUM 40 MG PO TABS: 40.0000 mg | ORAL_TABLET | Freq: Every day | ORAL | 0 refills | Status: AC

## 2020-02-21 MED ORDER — HEPARIN SODIUM (PORCINE) 1000 UNIT/ML IJ SOLN
INTRAMUSCULAR | Status: DC | PRN
  Administered 2020-02-21: 3000 [IU] via INTRAVENOUS

## 2020-02-21 MED ORDER — SACUBITRIL-VALSARTAN 24-26 MG PO TABS: 1.0000 | ORAL_TABLET | Freq: Two times a day (BID) | ORAL | 0 refills | Status: DC

## 2020-02-21 MED ORDER — VERAPAMIL HCL 2.5 MG/ML IV SOLN
INTRAVENOUS | Status: DC | PRN
  Administered 2020-02-21: 10 mL via INTRA_ARTERIAL

## 2020-02-21 MED ORDER — LIDOCAINE HCL (PF) 1 % IJ SOLN
INTRAMUSCULAR | Status: DC | PRN
  Administered 2020-02-21: 2 mL via INTRADERMAL

## 2020-02-21 MED ORDER — ALUM & MAG HYDROXIDE-SIMETH 200-200-20 MG/5ML PO SUSP
30.0000 mL | Freq: Once | ORAL | Status: DC
  Filled 2020-02-21: qty 30

## 2020-02-21 MED ORDER — METOPROLOL SUCCINATE ER 50 MG PO TB24: 50.0000 mg | ORAL_TABLET | Freq: Every day | ORAL | 0 refills | Status: DC

## 2020-02-21 MED ORDER — HYDRALAZINE HCL 20 MG/ML IJ SOLN: 10.0000 mg | INTRAMUSCULAR | Status: AC | PRN

## 2020-02-21 MED ORDER — SPIRONOLACTONE 25 MG PO TABS: 12.5000 mg | ORAL_TABLET | Freq: Every day | ORAL | 0 refills | Status: DC

## 2020-02-21 MED FILL — PRAVASTATIN NA 40 MG TAB: 40 | 30 days supply | Qty: 30 | Fill #0

## 2020-02-21 MED FILL — METOPROLOL SUCCINATE ER 50: 50 | 30 days supply | Qty: 30 | Fill #0

## 2020-02-21 MED FILL — SPIRONOLACTONE 25 MG TABLET: 25 | 30 days supply | Qty: 15 | Fill #0

## 2020-02-21 MED FILL — ENTRESTO 24 MG-26 MG TABLET: 24-26 | 30 days supply | Qty: 60 | Fill #0

## 2020-02-21 SURGICAL SUPPLY — 9 items
CATH OPTITORQUE TIG 4.0 5F (CATHETERS) ×1 IMPLANT
DEVICE RAD COMP TR BAND LRG (VASCULAR PRODUCTS) ×1 IMPLANT
GLIDESHEATH SLEND SS 6F .021 (SHEATH) ×1 IMPLANT
GUIDEWIRE INQWIRE 1.5J.035X260 (WIRE) IMPLANT
INQWIRE 1.5J .035X260CM (WIRE) ×2
KIT HEART LEFT (KITS) ×2 IMPLANT
PACK CARDIAC CATHETERIZATION (CUSTOM PROCEDURE TRAY) ×2 IMPLANT
TRANSDUCER W/STOPCOCK (MISCELLANEOUS) ×2 IMPLANT
TUBING CIL FLEX 10 FLL-RA (TUBING) ×2 IMPLANT

## 2020-02-21 NOTE — Progress Notes (Signed)
Discharge to home. D/c instructions and HF booklet discussed with pt.  And husband at bedside. MD at bedside.

## 2020-02-21 NOTE — Care Management Important Message (Signed)
Important Message  Patient Details  Name: Rachel [name] MRN: [id number] Date of Birth: Nov 29, 1950   Medicare Important Message Given:  Yes     Shelda Altes 02/21/2020, 8:45 AM

## 2020-02-21 NOTE — Progress Notes (Signed)
0828am- Received from Cathlab, R radial with 12cc per report. Pt complaining of numbness on % fingers. Able to move all fingers , with good pulses and feeling.Level 0. Will monitor

## 2020-02-21 NOTE — TOC Benefit Eligibility Note (Signed)
Transition of Care [facility]) Benefit Eligibility Note    Patient Details  Name: Rachel [name] MRN: [id number] Date of Birth: February 25, 1951   Medication/Dose: Delene Loll  24--26 MG BID  Covered?: Yes  Tier: 3 Drug  Prescription Coverage Preferred Pharmacy: [facility] [place] with Person/Company/Phone Number:: JONATHON  @ [employer] # [phone]  Co-Pay: [phone]  Prior Approval: No  Deductible: Unmet (OUT-OF-POCKET :Linton Ham Phone Number: 02/21/2020, 10:15 AM

## 2020-02-21 NOTE — Discharge Summary (Signed)
Physician Discharge Summary  Shana Zavaleta FXT:[id number] DOB: 11-07-1950 DOA: 02/17/2020  PCP: Marton Redwood, MD  Admit date: 02/17/2020 Discharge date: 02/21/2020  Admitted From: Home Disposition: Home  Recommendations for Outpatient Follow-up:  1. Follow up with PCP in 1-2 weeks 2. Please obtain BMP/CBC in one week your next doctors visit.  3. Cardiac medications as prescribed.  Follow-up with cardiology in 1 week   Discharge Condition: Stable CODE STATUS: Full code Diet recommendation: Heart healthy  Brief/Interim Summary: 69 year old female with history of multiple system atrophy/parkinsonism as well as asthma. She was initially diagnosed with community-acquired pneumonia of the right lower lobe of her lung, but she was not noted to have any significant leukocytosis, fevers, or chills. She was started on antibiotic treatment with Rocephin and azithromycin which was then changed over to doxycycline. She was noted to have significant cardiomyopathy with LV dysfunction and EF 30-35% on 2D echocardiogram yesterday. EKG with left bundle branch block. She denied any chest pain and troponin trend had remained flat. Cardiology evaluation performed and patient was started on some gentle IV diuresis with Lasix.  Patient underwent left heart catheterization on 02/21/2020 which showed reduced EF but otherwise normal coronaries.  Cardiac medications were written recommended by cardiology team with outpatient follow-up. Patient seen and examined after the procedure she feels great, no complaints wishes to go home.  Assessment & Plan:   Principal Problem:   Community acquired pneumonia of right lower lobe of lung Active Problems:   Multiple system atrophy, 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HCC)   Mild intermittent asthma with (acute) exacerbation   Acute respiratory failure with hypoxia (HCC)   Acute combined systolic and diastolic heart failure (HCC)   Class 1 obesity due to excess calories with serious  comorbidity and body mass index (BMI) of 34.0 to 34.9 in adult   Prolonged Q-T interval on ECG    Acute hypoxemic respiratory failure likely secondary to newly diagnosed acute on chronic systolic and diastolic congestive heart failure-improved -Patient is euvolemic in nature.  Underwent left heart catheterization 7/26 morning which showed normal coronaries but reduced EF.  Cardiac medications were adjusted per cardiology team.  1 week follow-up and stable for discharge. -Echocardiogram showed EF of 30-35%  Essential hypertension-controlled -Prescribed metoprolol, Entresto and Aldactone  Prolonged QTC -Appears improved on EKG on 7/24 with QTC 494 MS -Home Celexa discontinued for now  Dyslipidemia -Pravastatin 40 mg daily  Hypothyroidism -Continue Synthroid -TSH within normal limits  Multiple system atrophy/Parkinson's -Follows with neurology Dr. Carles Collet -Continue on Sinemet  Obesity -Lifestyle changes  Anemia-stable -No overt bleeding noted.  Hemodynamically remained stable.  Body mass index is 30.52 kg/m.         Discharge Diagnoses:  Principal Problem:   Community acquired pneumonia of right lower lobe of lung Active Problems:   Multiple system atrophy, 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HCC)   Mild intermittent asthma with (acute) exacerbation   Acute respiratory failure with hypoxia (HCC)   Acute combined systolic and diastolic heart failure (HCC)   Class 1 obesity due to excess calories with serious comorbidity and body mass index (BMI) of 34.0 to 34.9 in adult   Prolonged Q-T interval on ECG      Consultations:  Cardiology  Subjective: Tolerated her left heart catheterization this morning without any issues.  She wishes to go home.  Stable for discharge  Discharge Exam: Vitals:   02/21/20 0915 02/21/20 0930  BP: (!) 113/50 (!) 105/61  Pulse: 75 75  Resp:    Temp:  SpO2:     Vitals:   02/21/20 0845 02/21/20 0900 02/21/20 0915 02/21/20 0930  BP:  (!) 140/70 (!) 118/61 (!) 113/50 (!) 105/61  Pulse: 70 72 75 75  Resp:      Temp:      TempSrc:      SpO2:      Weight:      Height:        General: Pt is alert, awake, not in acute distress Cardiovascular: RRR, S1/S2 +, no rubs, no gallops Respiratory: CTA bilaterally, no wheezing, no rhonchi Abdominal: Soft, NT, ND, bowel sounds + Extremities: no edema, no cyanosis  Discharge Instructions   Allergies as of 02/21/2020      Reactions   Fosamax [alendronate] Other (See Comments)   headache   Symbicort [budesonide-formoterol Fumarate] Other (See Comments)   Shakes and muscle weakness      Medication List    TAKE these medications   albuterol (2.5 MG/3ML) 0.083% nebulizer solution Commonly known as: PROVENTIL Take 3 mLs (2.5 mg total) by nebulization every 6 (six) hours as needed for wheezing or shortness of breath.   amantadine 100 MG capsule Commonly known as: SYMMETREL TAKE 1 CAPSULE BY MOUTH THREE TIMES DAILY What changed: when to take this   Breo Ellipta 100-25 MCG/INH Aepb Generic drug: fluticasone furoate-vilanterol Inhale 1 puff into the lungs daily.   Carbidopa-Levodopa ER 25-100 MG tablet controlled release Commonly known as: SINEMET CR TAKE 1 TABLET BY MOUTH SIX TIMES DAILY What changed: See the new instructions.   citalopram 20 MG tablet Commonly known as: CELEXA Take 20 mg by mouth daily.   fluticasone 50 MCG/ACT nasal spray Commonly known as: FLONASE Place 1 spray into both nostrils daily.   furosemide 20 MG tablet Commonly known as: LASIX Take 20 mg by mouth daily.   levothyroxine 112 MCG tablet Commonly known as: SYNTHROID Take 112 mcg by mouth daily.   Linzess 290 MCG Caps capsule Generic drug: linaclotide Take 290 mcg by mouth daily.   metoprolol succinate 50 MG 24 hr tablet Commonly known as: TOPROL-XL Take 1 tablet (50 mg total) by mouth daily. Take with or immediately following a meal. Start taking on: February 22, 2020    montelukast 10 MG tablet Commonly known as: SINGULAIR Take 1 tablet (10 mg total) by mouth at bedtime.   multivitamin with minerals Tabs tablet Take 2 tablets by mouth daily.   ondansetron 4 MG disintegrating tablet Commonly known as: Zofran ODT Take 1 tablet (4 mg total) by mouth every 8 (eight) hours as needed for nausea or vomiting.   pantoprazole 40 MG tablet Commonly known as: PROTONIX Take 40 mg by mouth at bedtime.   pravastatin 40 MG tablet Commonly known as: PRAVACHOL Take 1 tablet (40 mg total) by mouth daily at 6 PM.   sacubitril-valsartan 24-26 MG Commonly known as: ENTRESTO Take 1 tablet by mouth 2 (two) times daily.   spironolactone 25 MG tablet Commonly known as: ALDACTONE Take 0.5 tablets (12.5 mg total) by mouth daily. Start taking on: February 22, 2020   tamsulosin 0.4 MG Caps capsule Commonly known as: Flomax Take 1 capsule (0.4 mg total) by mouth daily.   VITAMIN D PO Take 2 drops by mouth daily.   vitamin E 45 MG (100 UNITS) capsule Take 200 Units by mouth daily.   Voltaren 1 % Gel Generic drug: diclofenac Sodium Apply 1 application topically 4 (four) times daily as needed (pain).       Follow-up Information  Tolia, Sunit, DO. Call in 1 day.   Specialties: Cardiology, Radiology, Vascular Surgery Why: To be seen in 1 week. Our office will also call and make appointment Contact information: [place] [address] [phone]        Marton Redwood, MD. Schedule an appointment as soon as possible for a visit in 1 week(s).   Specialty: Internal Medicine Contact information: [place] [address] [phone]              Allergies  Allergen Reactions  . Fosamax [Alendronate] Other (See Comments)    headache  . Symbicort [Budesonide-Formoterol Fumarate] Other (See Comments)    Shakes and muscle weakness    You were cared for by a hospitalist during your hospital stay. If you have any  questions about your discharge medications or the care you received while you were in the hospital after you are discharged, you can call the unit and asked to speak with the hospitalist on call if the hospitalist that took care of you is not available. Once you are discharged, your primary care physician will handle any further medical issues. Please note that no refills for any discharge medications will be authorized once you are discharged, as it is imperative that you return to your primary care physician (or establish a relationship with a primary care physician if you do not have one) for your aftercare needs so that they can reassess your need for medications and monitor your lab values.   Procedures/Studies: CT ANGIO CHEST PE W OR WO CONTRAST  Result Date: 02/17/2020 CLINICAL DATA:  Short of breath, positive D-dimer, pneumonia EXAM: CT ANGIOGRAPHY CHEST WITH CONTRAST TECHNIQUE: Multidetector CT imaging of the chest was performed using the standard protocol during bolus administration of intravenous contrast. Multiplanar CT image reconstructions and MIPs were obtained to evaluate the vascular anatomy. CONTRAST:  1mL OMNIPAQUE IOHEXOL 350 MG/ML SOLN COMPARISON:  02/17/2020 FINDINGS: Cardiovascular: This is a technically adequate evaluation of the pulmonary vasculature. No filling defects or pulmonary emboli. The heart is unremarkable without pericardial effusion. Normal caliber of the thoracic aorta. Mild atherosclerosis. Mediastinum/Nodes: No enlarged mediastinal, hilar, or axillary lymph nodes. Thyroid gland, trachea, and esophagus demonstrate no significant findings. Lungs/Pleura: Hypoventilatory changes are seen within the bilateral lower lobes. Mild background emphysema. No acute airspace disease, effusion, or pneumothorax. Central airways are patent. Upper Abdomen: No acute abnormality. Musculoskeletal: No acute or destructive bony lesions. Reconstructed images demonstrate no additional findings.  Review of the MIP images confirms the above findings. IMPRESSION: 1. No evidence of pulmonary embolus. 2. Hypoventilatory changes.  No acute airspace disease. 3. Aortic Atherosclerosis (ICD10-I70.0) and Emphysema (ICD10-J43.9). Electronically Signed   By: Randa Ngo M.D.   On: 02/17/2020 23:13   CARDIAC CATHETERIZATION  Result Date: 02/21/2020 Left Heart Catheterization 02/21/20: There is severe left ventricular systolic dysfunction. The left ventricular ejection fraction is less than 25% by visual estimate. LV end diastolic pressure is normal. Normal coronary arteries, right dominant circulation. Rec: Medical therapy for Non ischemic dilated cardiomyopathy. Patient has diagnosis of multisystem atrophy. 15 mL contrast used.   DG Chest Port 1 View  Result Date: 02/17/2020 CLINICAL DATA:  Shortness of breath. EXAM: PORTABLE CHEST 1 VIEW COMPARISON:  June 01, 2012. FINDINGS: There is a right lower lobe airspace opacity concerning for pneumonia. There is a linear opacity at the left lung base suggestive of atelectasis. Underlying emphysematous changes are suspected. The heart size is normal. There is prominence of the  right hilum which may be in part due to patient rotation. No pneumothorax. There are trace bilateral pleural effusions. There is no acute osseous abnormality. IMPRESSION: 1. Findings concerning for right lower lobe pneumonia. 2. Trace bilateral pleural effusions. 3. Probable atelectasis at the left lung base. 4. Prominence of the right hilum may be in part due to patient rotation. A short interval repeat two-view chest x-ray is recommended. Electronically Signed   By: Constance Holster M.D.   On: 02/17/2020 00:55   ECHOCARDIOGRAM COMPLETE  Result Date: 02/17/2020    ECHOCARDIOGRAM REPORT   Patient Name:   Rachel [name] Date of Exam: 02/17/2020 Medical Rec #:  [id number]     Height:       66.0 in Accession #:    [id number]    Weight:       178.8 lb Date of Birth:  01-01-1951      BSA:           1.907 m Patient Age:    28 years      BP:           128/93 mmHg Patient Gender: F             HR:           87 bpm. Exam Location:  Inpatient Procedure: 2D Echo, Cardiac Doppler and Color Doppler REPORT CONTAINS CRITICAL RESULT Indications:    R07.9* Chest pain, unspecified. Elevated troponin  History:        Patient has no prior history of Echocardiogram examinations.                 Signs/Symptoms:Chest Pain, Dyspnea and Shortness of Breath.                 Respiratory failure. Elevated troponin.  Sonographer:    Roseanna Rainbow RDCS Referring Phys: [id number] Royanne Foots [place]  1. Left ventricular ejection fraction, by estimation, is 30 to 35%. The left ventricle has moderately decreased function. The left ventricle demonstrates global hypokinesis. Abnormal septal motion consistent with left bundle branch block.The left ventricular internal cavity size was mildly dilated. There is mild left ventricular hypertrophy. Left ventricular diastolic parameters are indeterminate.  2. Right ventricular systolic function is normal. The right ventricular size is normal. Tricuspid regurgitation signal is inadequate for assessing PA pressure.  3. The mitral valve is normal in structure. Mild mitral valve regurgitation.  4. The aortic valve has an indeterminant number of cusps. Aortic valve regurgitation is not visualized. Mild aortic valve sclerosis is present, with no evidence of aortic valve stenosis.  5. The inferior vena cava is dilated in size with <50% respiratory variability, suggesting right atrial pressure of 15 mmHg. FINDINGS  Left Ventricle: Left ventricular ejection fraction, by estimation, is 30 to 35%. The left ventricle has moderately decreased function. The left ventricle demonstrates global hypokinesis. The left ventricular internal cavity size was mildly dilated. There is mild left ventricular hypertrophy. There is severe asymmetric left ventricular Left ventricular diastolic parameters are indeterminate.  Right Ventricle: The right ventricular size is normal. Right vetricular wall thickness was not assessed. Right ventricular systolic function is normal. Tricuspid regurgitation signal is inadequate for assessing PA pressure. Left Atrium: Left atrial size was normal in size. Right Atrium: Right atrial size was normal in size. Pericardium: There is no evidence of pericardial effusion. Mitral Valve: The mitral valve is normal in structure. Mild mitral valve regurgitation. MV peak gradient, 8.8 mmHg. The mean mitral valve gradient is 2.0 mmHg.  Tricuspid Valve: The tricuspid valve is normal in structure. Tricuspid valve regurgitation is trivial. Aortic Valve: The aortic valve has an indeterminant number of cusps. Aortic valve regurgitation is not visualized. Mild aortic valve sclerosis is present, with no evidence of aortic valve stenosis. Pulmonic Valve: The pulmonic valve was grossly normal. Pulmonic valve regurgitation is not visualized. Aorta: The aortic root and ascending aorta are structurally normal, with no evidence of dilitation. Venous: The inferior vena cava is dilated in size with less than 50% respiratory variability, suggesting right atrial pressure of 15 mmHg. IAS/Shunts: The interatrial septum was not well visualized.  LEFT VENTRICLE PLAX 2D LVIDd:         4.60 cm      Diastology LVIDs:         4.10 cm      LV e' lateral:   5.66 cm/s LV PW:         1.30 cm      LV E/e' lateral: 21.7 LV IVS:        1.20 cm      LV e' medial:    5.11 cm/s LVOT diam:     1.80 cm      LV E/e' medial:  24.1 LV SV:         66 LV SV Index:   35 LVOT Area:     2.54 cm  LV Volumes (MOD) LV vol d, MOD A2C: 126.0 ml LV vol d, MOD A4C: 113.0 ml LV vol s, MOD A2C: 86.6 ml LV vol s, MOD A4C: 86.6 ml LV SV MOD A2C:     39.4 ml LV SV MOD A4C:     113.0 ml LV SV MOD BP:      34.3 ml RIGHT VENTRICLE             IVC RV S prime:     12.10 cm/s  IVC diam: 2.80 cm TAPSE (M-mode): 2.5 cm LEFT ATRIUM             Index       RIGHT ATRIUM            Index LA diam:        3.60 cm 1.89 cm/m  RA Area:     12.90 cm LA Vol (A2C):   46.0 ml 24.12 ml/m RA Volume:   29.90 ml  15.68 ml/m LA Vol (A4C):   50.8 ml 26.64 ml/m LA Biplane Vol: 50.4 ml 26.43 ml/m  AORTIC VALVE             PULMONIC VALVE LVOT Vmax:   135.00 cm/s PR End Diast Vel: 1.75 msec LVOT Vmean:  88.400 cm/s LVOT VTI:    0.260 m  AORTA Ao Root diam: 2.90 cm Ao Asc diam:  3.00 cm MITRAL VALVE MV Area (PHT): 6.54 cm      SHUNTS MV Peak grad:  8.8 mmHg      Systemic VTI:  0.26 m MV Mean grad:  2.0 mmHg      Systemic Diam: 1.80 cm MV Vmax:       1.48 m/s MV Vmean:      62.9 cm/s MV Decel Time: 116 msec MR Peak grad:    91.0 mmHg MR Mean grad:    61.0 mmHg MR Vmax:         477.00 cm/s MR Vmean:        366.0 cm/s MR PISA:         2.26 cm MR PISA Eff ROA:  15 mm MR PISA Radius:  0.60 cm MV E velocity: 123.00 cm/s Oswaldo Milian MD Electronically signed by Oswaldo Milian MD Signature Date/Time: 02/17/2020/3:35:22 PM    Final       The results of significant diagnostics from this hospitalization (including imaging, microbiology, ancillary and laboratory) are listed below for reference.     Microbiology: Recent Results (from the past 240 hour(s))  SARS Coronavirus 2 by RT PCR (hospital order, performed in [facility] hospital lab) Nasopharyngeal Nasopharyngeal Swab     Status: None   Collection Time: 02/17/20 12:21 AM   Specimen: Nasopharyngeal Swab  Result Value Ref Range Status   SARS Coronavirus 2 NEGATIVE NEGATIVE Final    Comment: (NOTE) SARS-CoV-2 target nucleic acids are NOT DETECTED.  The SARS-CoV-2 RNA is generally detectable in upper and lower respiratory specimens during the acute phase of infection. The lowest concentration of SARS-CoV-2 viral copies this assay can detect is 250 copies / mL. A negative result does not preclude SARS-CoV-2 infection and should not be used as the sole basis for treatment or other patient management decisions.  A negative result  may occur with improper specimen collection / handling, submission of specimen other than nasopharyngeal swab, presence of viral mutation(s) within the areas targeted by this assay, and inadequate number of viral copies (<250 copies / mL). A negative result must be combined with clinical observations, patient history, and epidemiological information.  Fact Sheet for Patients:   StrictlyIdeas.no  Fact Sheet for Healthcare Providers: BankingDealers.co.za  This test is not yet approved or  cleared by the [place] FDA and has been authorized for detection and/or diagnosis of SARS-CoV-2 by FDA under an Emergency Use Authorization (EUA).  This EUA will remain in effect (meaning this test can be used) for the duration of the COVID-19 declaration under Section 564(b)(1) of the Act, 21 U.S.C. section 360bbb-3(b)(1), unless the authorization is terminated or revoked sooner.  Performed at [facility], [place] [address], [place], [place] [address]   Culture, blood (Routine X 2) w Reflex to ID Panel     Status: None (Preliminary result)   Collection Time: 02/17/20  1:35 AM   Specimen: BLOOD  Result Value Ref Range Status   Specimen Description BLOOD RIGHT ANTECUBITAL  Final   Special Requests   Final    BOTTLES DRAWN AEROBIC AND ANAEROBIC Blood Culture adequate volume   Culture   Final    NO GROWTH 4 DAYS Performed at [facility], [place] [address], [place], [place] [address]    Report Status PENDING  Incomplete  Culture, blood (Routine X 2) w Reflex to ID Panel     Status: None (Preliminary result)   Collection Time: 02/17/20  2:22 AM   Specimen: BLOOD  Result Value Ref Range Status   Specimen Description BLOOD SITE NOT SPECIFIED  Final   Special Requests   Final    BOTTLES DRAWN AEROBIC AND ANAEROBIC Blood Culture results may not be optimal due to an excessive volume of blood received in culture bottles   Culture   Final     NO GROWTH 4 DAYS Performed at [facility], [place] [address] [place],  [address]    Report Status PENDING  Incomplete     Labs: BNP (last 3 results) Recent Labs    02/17/20 0021 02/20/20 1307  BNP 484.2* 765.4*   Basic Metabolic Panel: Recent Labs  Lab 02/17/20 0021 02/17/20 0021 02/17/20 0256 02/18/20 0602 02/19/20 0418 02/20/20  0615 02/21/20 0440  NA 141   < > 143 139 139 138 136  K 3.5   < > 3.3* 3.5 3.4* 3.6 3.8  CL 103  --   --  102 100 101 99  CO2 27  --   --  29 28 29 31   GLUCOSE 151*  --   --  105* 114* 88 87  BUN 13  --   --  16 21 15 18   CREATININE 1.05*  --   --  0.94 1.10* 0.95 1.41*  CALCIUM 9.4  --   --  9.1 8.8* 8.8* 8.7*  MG  --   --   --  2.0 1.8 2.1 2.0   < > = values in this interval not displayed.   Liver Function Tests: No results for input(s): AST, ALT, ALKPHOS, BILITOT, PROT, ALBUMIN in the last 168 hours. No results for input(s): LIPASE, AMYLASE in the last 168 hours. No results for input(s): AMMONIA in the last 168 hours. CBC: Recent Labs  Lab 02/17/20 0021 02/17/20 0256 02/18/20 0602 02/19/20 0418  WBC 7.6  --  7.7 7.5  NEUTROABS 3.8  --   --   --   HGB 12.2 12.6 10.5* 11.5*  HCT 39.9 37.0 33.6* 35.4*  MCV 93.2  --  91.6 91.2  PLT 311  --  245 267   Cardiac Enzymes: No results for input(s): CKTOTAL, CKMB, CKMBINDEX, TROPONINI in the last 168 hours. BNP: Invalid input(s): POCBNP CBG: No results for input(s): GLUCAP in the last 168 hours. D-Dimer No results for input(s): DDIMER in the last 72 hours. Hgb A1c No results for input(s): HGBA1C in the last 72 hours. Lipid Profile No results for input(s): CHOL, HDL, LDLCALC, TRIG, CHOLHDL, LDLDIRECT in the last 72 hours. Thyroid function studies No results for input(s): TSH, T4TOTAL, T3FREE, THYROIDAB in the last 72 hours.  Invalid input(s): FREET3 Anemia work up Recent Labs    02/18/20 1609  VITAMINB12 194  FOLATE 19.7  FERRITIN 42  TIBC 385  IRON 34   RETICCTPCT 1.5   Urinalysis    Component Value Date/Time   COLORURINE YELLOW 11/14/2019 1456   APPEARANCEUR CLEAR 11/14/2019 1456   LABSPEC 1.011 11/14/2019 1456   PHURINE 6.0 11/14/2019 1456   GLUCOSEU NEGATIVE 11/14/2019 1456   HGBUR MODERATE (A) 11/14/2019 1456   BILIRUBINUR NEGATIVE 11/14/2019 1456   KETONESUR NEGATIVE 11/14/2019 1456   PROTEINUR NEGATIVE 11/14/2019 1456   UROBILINOGEN 0.2 06/01/2012 1340   NITRITE NEGATIVE 11/14/2019 1456   LEUKOCYTESUR TRACE (A) 11/14/2019 1456   Sepsis Labs Invalid input(s): PROCALCITONIN,  WBC,  LACTICIDVEN Microbiology Recent Results (from the past 240 hour(s))  SARS Coronavirus 2 by RT PCR (hospital order, performed in [place] hospital lab) Nasopharyngeal Nasopharyngeal Swab     Status: None   Collection Time: 02/17/20 12:21 AM   Specimen: Nasopharyngeal Swab  Result Value Ref Range Status   SARS Coronavirus 2 NEGATIVE NEGATIVE Final    Comment: (NOTE) SARS-CoV-2 target nucleic acids are NOT DETECTED.  The SARS-CoV-2 RNA is generally detectable in upper and lower respiratory specimens during the acute phase of infection. The lowest concentration of SARS-CoV-2 viral copies this assay can detect is 250 copies / mL. A negative result does not preclude SARS-CoV-2 infection and should not be used as the sole basis for treatment or other patient management decisions.  A negative result may occur with improper specimen collection / handling, submission of specimen other than nasopharyngeal swab, presence of viral mutation(s)  within the areas targeted by this assay, and inadequate number of viral copies (<250 copies / mL). A negative result must be combined with clinical observations, patient history, and epidemiological information.  Fact Sheet for Patients:   StrictlyIdeas.no  Fact Sheet for Healthcare Providers: BankingDealers.co.za  This test is not yet approved or  cleared by  the [place] FDA and has been authorized for detection and/or diagnosis of SARS-CoV-2 by FDA under an Emergency Use Authorization (EUA).  This EUA will remain in effect (meaning this test can be used) for the duration of the COVID-19 declaration under Section 564(b)(1) of the Act, 21 U.S.C. section 360bbb-3(b)(1), unless the authorization is terminated or revoked sooner.  Performed at [facility], [place] [address] [place], [place] [address]   Culture, blood (Routine X 2) w Reflex to ID Panel     Status: None (Preliminary result)   Collection Time: 02/17/20  1:35 AM   Specimen: BLOOD  Result Value Ref Range Status   Specimen Description BLOOD RIGHT ANTECUBITAL  Final   Special Requests   Final    BOTTLES DRAWN AEROBIC AND ANAEROBIC Blood Culture adequate volume   Culture   Final    NO GROWTH 4 DAYS Performed at [facility], [place] [address], [place], [place] [address]    Report Status PENDING  Incomplete  Culture, blood (Routine X 2) w Reflex to ID Panel     Status: None (Preliminary result)   Collection Time: 02/17/20  2:22 AM   Specimen: BLOOD  Result Value Ref Range Status   Specimen Description BLOOD SITE NOT SPECIFIED  Final   Special Requests   Final    BOTTLES DRAWN AEROBIC AND ANAEROBIC Blood Culture results may not be optimal due to an excessive volume of blood received in culture bottles   Culture   Final    NO GROWTH 4 DAYS Performed at [facility], [place] [address], [place], [place] [address]    Report Status PENDING  Incomplete     Time coordinating discharge:  I have spent 35 minutes face to face with the patient and on the ward discussing the patients care, assessment, plan and disposition with other care givers. >50% of the time was devoted counseling the patient about the risks and benefits of treatment/Discharge disposition and coordinating care.   SIGNED:   Damita Lack, MD  [facility] 02/21/2020, 10:29 AM   If  7PM-7AM, please contact night-coverage

## 2020-02-21 NOTE — Progress Notes (Signed)
Discussed with patient's husband.  Patient is stable for discharge from cardiac standpoint.  Only 10 to 15 mL of contrast was utilized for angiography.  Continue low-dose Entresto for now in view of renal insufficiency, I have increased the dose of metoprolol succinate to 50 mg daily.  We will arrange for a transition of care follow-up for acute decompensated heart failure.  We will see her back within a week.   Adrian Prows, MD, [facility] 02/21/2020, 8:20 AM Office: [phone]

## 2020-02-21 NOTE — Interval H&P Note (Signed)
History and Physical Interval Note:  02/21/2020 6:41 AM  Rachel [name]  has presented today for surgery, with the diagnosis of NSTEMI.  The various methods of treatment have been discussed with the patient and family. After consideration of risks, benefits and other options for treatment, the patient has consented to  Procedure(s): LEFT HEART CATH AND CORONARY ANGIOGRAPHY (N/A) and possible intervention as a surgical intervention.  The patient's history has been reviewed, patient examined, no change in status, stable for surgery.  I have reviewed the patient's chart and labs.  Questions were answered to the patient's satisfaction.   Cath Lab Visit (complete for each Cath Lab visit)  Clinical Evaluation Leading to the Procedure:   ACS: Yes.    Non-ACS:    Anginal Classification: CCS IV  Anti-ischemic medical therapy: Minimal Therapy (1 class of medications)  Non-Invasive Test Results: No non-invasive testing performed  Prior CABG: No previous CABG  Adrian Prows

## 2020-02-21 NOTE — Progress Notes (Signed)
Heart Failure Stewardship Pharmacist Progress Note   PCP: Marton Redwood, MD PCP-Cardiologist: No primary care provider on file.   HPI:  69 yo F with PMH significant for HTN, HLD, hypothyroidism, LBBB, asthma, history of LE DVT. She presented to [facility] ED on 02/17/20 complaining of shortness of breath. CXR reveals RLL PNA and patient was started on azithromycin and ceftriaxone. Antibiotics were stopped today given low suspicion for infection and concern that respiratory failure was related to acute systolic and diastolic congestive heart failure. An ECHO was done on 02/17/20 and she was found to have new low LVEF of 30-35% with normal RV function. She has received IV lasix and is net negative 2L since admission.  Current HF Medications: Metoprolol succinate 50 mg daily Entresto 24/26 mg BID Spironolactone 12.5 mg daily  Prior to admission HF Medications: Furosemide 20 mg daily  Pertinent Lab Values:  Serum creatinine 1.41, BUN 18, Potassium 3.8, Sodium 136, BNP 484.2, Magnesium 2   Vital Signs:  Weight: 177 lbs (admission weight: 198 lbs)  Blood pressure: 100-130/70s   Heart rate: 60-70s   Medication Assistance / Insurance Benefits Check: Does the patient have prescription insurance?   yes Type of insurance plan: BCBS Medicare  Does the patient qualify for medication assistance through manufacturers or grants?  Yes  Eligible grants and/or patient assistance programs: Entresto  Medication assistance applications in progress: Entresto  Medication assistance applications approved: none  Approved medication assistance renewals will be completed by: Dr. Brennan Bailey office  Outpatient Pharmacy:  Prior to admission outpatient pharmacy: [facility] Is the patient willing to use [facility] at discharge?   Yes - updated pharmacy Is the patient willing to transition their outpatient pharmacy to utilize a [facility] outpatient pharmacy?   No    Assessment: 1. Acute systolic CHF (EF  17-49%), due to NICM. NYHA class II/III symptoms. LHC with normal coronary arteries.  - Continue metoprolol succinate 50 mg daily - Continue Entresto 24/26 mg BID - per case manager, copay is $144.86 but deductible is unmet. Will provide patient with [employer] patient assistance application. Instructed to bring to first follow up visit with Dr. Terri Skains - Continue spironolactone 12.5 mg daily - could increase to 25 mg daily once SCr improves - Consider starting SGLT2i at follow up visit - SCr bump today, would caution initiation prior to discharge   Plan: 1) Medication changes recommended at this time: - None  2) Patient assistance application(s): - Entresto patient assistance through [employer] - patient portion completed, instructed to bring to first visit with Dr. Terri Skains to complete and fax to [employer]  3)  Education  -Patient has been educated on current HF medications (Entresto, metoprolol, spironolactone) and potential additions to HF medication regimen (Farxiga) -Patient verbalizes understanding that over the next few months, these medication doses may change and more medications may be added to optimize HF regimen -Patient has been educated on basic disease state pathophysiology and goals of therapy -Time spent (30 min)   Vertis Kelch, PharmD, BCPS Heart Failure Stewardship Pharmacist 02/21/2020       10:24 AM

## 2020-02-22 LAB — CULTURE, BLOOD (ROUTINE X 2)
Culture: NO GROWTH
Culture: NO GROWTH
Special Requests: ADEQUATE

## 2020-02-22 NOTE — Telephone Encounter (Signed)
8/13 at 2:45pm with st   Thank you  -Leda Quail

## 2020-02-24 ENCOUNTER — Other Ambulatory Visit (HOSPITAL_COMMUNITY): Payer: Self-pay | Admitting: Internal Medicine

## 2020-02-24 ENCOUNTER — Ambulatory Visit (HOSPITAL_COMMUNITY)
Admission: RE | Admit: 2020-02-24 | Discharge: 2020-02-24 | Disposition: A | Discharge status: Home / Self Care | Payer: Medicare Other | Source: Ambulatory Visit | Attending: Internal Medicine | Admitting: Internal Medicine

## 2020-02-24 ENCOUNTER — Other Ambulatory Visit: Payer: Self-pay

## 2020-02-24 DIAGNOSIS — M7989 Other specified soft tissue disorders: Secondary | ICD-10-CM

## 2020-02-24 DIAGNOSIS — J189 Pneumonia, unspecified organism: Secondary | ICD-10-CM | POA: Diagnosis not present

## 2020-02-24 DIAGNOSIS — I5043 Acute on chronic combined systolic (congestive) and diastolic (congestive) heart failure: Secondary | ICD-10-CM | POA: Diagnosis not present

## 2020-02-24 DIAGNOSIS — M79602 Pain in left arm: Secondary | ICD-10-CM

## 2020-02-24 DIAGNOSIS — I129 Hypertensive chronic kidney disease with stage 1 through stage 4 chronic kidney disease, or unspecified chronic kidney disease: Secondary | ICD-10-CM | POA: Diagnosis not present

## 2020-02-24 DIAGNOSIS — N1832 Chronic kidney disease, stage 3b: Secondary | ICD-10-CM | POA: Diagnosis not present

## 2020-02-24 NOTE — Progress Notes (Signed)
VASCULAR LAB PRELIMINARY  PRELIMINARY  PRELIMINARY  PRELIMINARY  Left upper extremity venous duplex completed.    Preliminary report:  See CV proc for preliminary results.  Called report to the office  Sayward Horvath, RVT 02/24/2020, 5:17 PM

## 2020-03-06 ENCOUNTER — Telehealth: Payer: Self-pay

## 2020-03-06 ENCOUNTER — Other Ambulatory Visit: Payer: Self-pay | Admitting: Cardiology

## 2020-03-06 DIAGNOSIS — I5041 Acute combined systolic (congestive) and diastolic (congestive) heart failure: Secondary | ICD-10-CM

## 2020-03-06 NOTE — Telephone Encounter (Signed)
Please have her get labs done either Tuesday/Wednesday so we can review them at the upcoming office visit.  Labs sent to [facility].

## 2020-03-06 NOTE — Telephone Encounter (Signed)
Pt husband called to inform us that pt has an appt this Friday for a hospital f/u. N=but is concerned about pt bp and would like to know what should she do. Due that her appt is friday

## 2020-03-09 ENCOUNTER — Ambulatory Visit: Payer: Self-pay | Admitting: Cardiology

## 2020-03-09 ENCOUNTER — Telehealth (HOSPITAL_COMMUNITY): Payer: Self-pay

## 2020-03-09 NOTE — Telephone Encounter (Signed)
Heart Failure Stewardship Pharmacist  Transitions of Care Follow-up Call  PCP: Marton Redwood, MD PCP-Cardiologist: Dr. Terri Skains    HPI and Hospital Course:  69 yo F with PMH significant for HTN, HLD, hypothyroidism, LBBB, asthma, history of LE DVT. She presented to [facility] ED on 02/17/20 complaining of shortness of breath. CXR revealed RLL PNA and she was started on azithromycin and ceftriaxone. Antibiotics were stopped given low suspicion for infection and concern that respiratory failure was related to acute systolic and diastolic congestive heart failure. An ECHO was done on 02/17/20 and she was found to have new low LVEF of 30-35% with normal RV function. She was diuresed, started on HF medications, and was discharged on 02/21/20.  Since her hospital discharge, she has had issues with low blood pressure. She saw Dr. Terri Skains in office last week and she was taken off her furosemide and spironolactone temporarily due to elevated SCr and low BP. Overall, she says she is still feeling sluggish and has trouble getting things done around the house. She is asking questions about help with PT. She reports feeling better since stopping furosemide and spironolactone. Her breathing is good and denies shortness of breath. Denies chest pain or palpitations.   Pertinent Lab Values: . Serum creatinine 1.43, BUN 25, Potassium 4, Sodium 140, BNP 376, Magnesium 1.8  HF Medications: Furosemide 20 mg daily - holding Metoprolol succinate 50 mg daily Entresto 24/26 mg BID Spironolactone 12.5 mg daily - holding   Did the pt receive and understand the discharge instructions provided? yes  Have you obtained your medications from the pharmacy after your hospital discharge? yes  Are you experiencing any side effects to your medications: no  Medication Assistance / Insurance Benefits Check: Does the patient have prescription insurance?  Yes Type of insurance plan: BCBS Medicare  Does the patient qualify for medication  assistance through manufacturers or grants?   Yes . Eligible grants and/or patient assistance programs: Entresto . Medication assistance applications in progress: Entresto  . Medication assistance applications approved: none Approved medication assistance renewals will be completed by: Dr. Brennan Bailey office  Assessment: 1. Chronic systolic CHF (EF 95-28%), due to NICM. NYHA class II/III symptoms. - Continue to hold furosemide - Continue metoprolol succinate 50 mg daily - Continue Entresto 24/26 mg BID - pending patient assistance approval from [employer] - Agree with holding spironolactone until next BMET. Would resume 12.5 mg daily pending SCr and BP improvement.   Plan: 1) Medication changes recommended at this time: - Agree with repeating BMET and restarting spironolactone 12.5 mg daily if able  2) Patient assistance application(s): - Entresto patient assistance through [employer]  - Application was dropped off at Dr. Brennan Bailey office  3) Education - Patient has been educated on current HF medications (Entresto, metoprolol, spironolactone, furosemide) and potential additions to HF medication regimen (Farxiga) - Patient verbalizes understanding that over the next few months, these medication doses may change and more medications may be added to optimize HF regimen - Patient has been educated on basic disease state pathophysiology and goals of therapy - Time spent (30 min)   Vertis Kelch, PharmD, BCPS HF Stewardship Pharmacist Phone [phone] 03/14/2020       10:02 AM

## 2020-03-10 ENCOUNTER — Encounter: Payer: Self-pay | Admitting: Cardiology

## 2020-03-10 ENCOUNTER — Other Ambulatory Visit: Payer: Self-pay

## 2020-03-10 ENCOUNTER — Ambulatory Visit: Payer: Medicare Other | Admitting: Cardiology

## 2020-03-10 VITALS — BP 116/64 | HR 58 | Resp 15 | Ht 64.0 in | Wt 174.0 lb

## 2020-03-10 DIAGNOSIS — I5042 Chronic combined systolic (congestive) and diastolic (congestive) heart failure: Secondary | ICD-10-CM

## 2020-03-10 DIAGNOSIS — I5041 Acute combined systolic (congestive) and diastolic (congestive) heart failure: Secondary | ICD-10-CM | POA: Diagnosis not present

## 2020-03-10 DIAGNOSIS — I447 Left bundle-branch block, unspecified: Secondary | ICD-10-CM

## 2020-03-10 DIAGNOSIS — E782 Mixed hyperlipidemia: Secondary | ICD-10-CM

## 2020-03-10 DIAGNOSIS — I130 Hypertensive heart and chronic kidney disease with heart failure and stage 1 through stage 4 chronic kidney disease, or unspecified chronic kidney disease: Secondary | ICD-10-CM | POA: Diagnosis not present

## 2020-03-10 DIAGNOSIS — I428 Other cardiomyopathies: Secondary | ICD-10-CM | POA: Diagnosis not present

## 2020-03-10 DIAGNOSIS — Z86718 Personal history of other venous thrombosis and embolism: Secondary | ICD-10-CM

## 2020-03-10 DIAGNOSIS — N183 Chronic kidney disease, stage 3 unspecified: Secondary | ICD-10-CM

## 2020-03-10 NOTE — Progress Notes (Signed)
Rachel [name] Date of Birth: 11/14/1950 MRN: [id number] Primary Care Provider:Shaw, Gwyndolyn Saxon, MD Primary Cardiologist: Rex Kras, DO, [facility] (established care 03/10/2020)  Date: 03/10/20  Chief Complaint  Patient presents with  . Hospitalization Follow-up    2 week  . Congestive Heart Failure  . Cardiomyopathy    HPI  Rachel [name] is a 69 y.o.  female who presents to the office with a chief complaint of " hospital follow-up, management of congestive heart failure." Patient's past medical history and cardiovascular risk factors include: Chronic systolic and diastolic heart failure/stage C/NYHA class II, nonischemic cardiomyopathy, multiple system atrophy, asthma, history of left lower extremity DVT, hypertension, hyperlipidemia, hypothyroidism, left bundle branch block, advanced age, postmenopausal female.  Patient is accompanied by her husband at today's office visit.  Patient presented to the hospital back in July 2021 for symptoms of shortness of breath.  She was diagnosed with newly discovered cardiomyopathy and congestive heart failure.  Cardiology was consulted for further management.  Patient was diagnosed with acute systolic and diastolic heart failure she was started on guideline directed medical therapy.  Due to moderately reduced LVEF, left bundle branch block, and chest pain she was recommended to undergo left heart catheterization.  Patient was noted to have nonischemic cardiomyopathy.  Patient medications were uptitrated prior to discharge asked to follow-up.  Since discharge patient states that on March 07, 2020 she was having generalized shaking and chest discomfort and called EMS.  She brings in EMS run strip for me to review which shows normal sinus rhythm with ST depressions in the high lateral and lateral leads.  She never went to the hospital after the episode.  She also had called in the interim that her blood pressure was running low.  She was asked to hold  spironolactone which did help her symptoms.  She is supposed to have blood work done prior to today's office visit to reevaluate kidney function but she forgot to get this done.   ALLERGIES: Allergies  Allergen Reactions  . Fosamax [Alendronate] Other (See Comments)    headache  . Symbicort [Budesonide-Formoterol Fumarate] Other (See Comments)    Shakes and muscle weakness     MEDICATION LIST PRIOR TO VISIT: Current Outpatient Medications on File Prior to Visit  Medication Sig Dispense Refill  . albuterol (PROVENTIL) (2.5 MG/3ML) 0.083% nebulizer solution Take 3 mLs (2.5 mg total) by nebulization every 6 (six) hours as needed for wheezing or shortness of breath. 120 mL 11  . amantadine (SYMMETREL) 100 MG capsule TAKE 1 CAPSULE BY MOUTH THREE TIMES DAILY (Patient taking differently: Take 100 mg by mouth in the morning, at noon, and at bedtime. ) 180 capsule 0  . BREO ELLIPTA 100-25 MCG/INH AEPB Inhale 1 puff into the lungs daily.    . Carbidopa-Levodopa ER (SINEMET CR) 25-100 MG tablet controlled release TAKE 1 TABLET BY MOUTH SIX TIMES DAILY (Patient taking differently: Take 1 tablet by mouth 6 (six) times daily. ) 540 tablet 0  . citalopram (CELEXA) 20 MG tablet Take 20 mg by mouth daily.    . fluticasone (FLONASE) 50 MCG/ACT nasal spray Place 1 spray into both nostrils daily. 16 g 11  . furosemide (LASIX) 20 MG tablet Take 20 mg by mouth daily.    Marland Kitchen levothyroxine (SYNTHROID) 112 MCG tablet Take 112 mcg by mouth daily.    Marland Kitchen LINZESS 290 MCG CAPS capsule Take 290 mcg by mouth daily.    . metoprolol succinate (TOPROL-XL) 50 MG 24 hr tablet Take 1  tablet (50 mg total) by mouth daily. Take with or immediately following a meal. 30 tablet 0  . montelukast (SINGULAIR) 10 MG tablet Take 1 tablet (10 mg total) by mouth at bedtime. 30 tablet 11  . Multiple Vitamin (MULTIVITAMIN WITH MINERALS) TABS tablet Take 2 tablets by mouth daily.    . ondansetron (ZOFRAN ODT) 4 MG disintegrating tablet Take 1  tablet (4 mg total) by mouth every 8 (eight) hours as needed for nausea or vomiting. 20 tablet 0  . pantoprazole (PROTONIX) 40 MG tablet Take 40 mg by mouth at bedtime.     . pravastatin (PRAVACHOL) 40 MG tablet Take 1 tablet (40 mg total) by mouth daily at 6 PM. 30 tablet 0  . sacubitril-valsartan (ENTRESTO) 24-26 MG Take 1 tablet by mouth 2 (two) times daily. 60 tablet 0  . spironolactone (ALDACTONE) 25 MG tablet Take 0.5 tablets (12.5 mg total) by mouth daily. 15 tablet 0  . tamsulosin (FLOMAX) 0.4 MG CAPS capsule Take 1 capsule (0.4 mg total) by mouth daily. 7 capsule 0  . VITAMIN D PO Take 2 drops by mouth daily.    . vitamin E 100 UNIT capsule Take 200 Units by mouth daily.     . VOLTAREN 1 % GEL Apply 1 application topically 4 (four) times daily as needed (pain).      Current Facility-Administered Medications on File Prior to Visit  Medication Dose Route Frequency Provider Last Rate Last Admin  . mometasone-formoterol (DULERA) 100-5 MCG/ACT inhaler 2 puff  2 puff Inhalation BID Spero Geralds, MD        PAST MEDICAL HISTORY: Past Medical History:  Diagnosis Date  . Allergic rhinitis   . Anemia   . Arthritis   . Asthma   . Blood clot in vein  age 18   left leg  . Depression   . GERD (gastroesophageal reflux disease)   . Hyperlipidemia   . Hypertension    pt denies, in physician record  . Hypothyroidism   . Kidney stones   . Multiple pregnancy loss, not currently pregnant    multiple miscarriages-pt.unsure of #  . Multiple system atrophy ([place]) 2015  . Nephrolithiasis    hx of kidney stones, last 6 months ago  . Osteopenia   . Osteoporosis   . Parkinsonism ([place])   . Tendinitis    left foot  . Vitamin D deficiency     PAST SURGICAL HISTORY: Past Surgical History:  Procedure Laterality Date  . BREAST EXCISIONAL BIOPSY Right   . BREAST SURGERY     rt. lumpectomy, benign  . colonscopy  2011  . cystoscopy  2012  . JOINT REPLACEMENT  05-2007   right knee  replacment/revision  . KIDNEY STONE SURGERY    . LEFT HEART CATH AND CORONARY ANGIOGRAPHY N/A 02/21/2020   Procedure: LEFT HEART CATH AND CORONARY ANGIOGRAPHY;  Surgeon: Adrian Prows, MD;  Location: [facility];  Service: Cardiovascular;  Laterality: N/A;  . REPLACEMENT TOTAL KNEE Left 06/05/12  . TOTAL KNEE REVISION  06/03/2012   Procedure: TOTAL KNEE REVISION;  Surgeon: Gearlean Alf, MD;  Location: [facility];  Service: Orthopedics;  Laterality: Left;  Revision of a Left Uni Knee to a Total Knee Arthroplasty    FAMILY HISTORY: The patient's family history includes Asthma in her sister; Breast cancer in her sister; Diabetes in her mother; Heart attack in her brother; Heart failure in her father, mother, and sister; Thyroid disease in her mother.   SOCIAL  HISTORY:  The patient  reports that she has never smoked. She has never used smokeless tobacco. She reports that she does not drink alcohol and does not use drugs.  Review of Systems  Constitutional: Negative for chills and fever.  HENT: Negative for hoarse voice and nosebleeds.   Eyes: Negative for discharge, double vision and pain.  Cardiovascular: Negative for chest pain, claudication, dyspnea on exertion, leg swelling, near-syncope, orthopnea, palpitations, paroxysmal nocturnal dyspnea and syncope.  Respiratory: Positive for shortness of breath. Negative for hemoptysis.   Musculoskeletal: Negative for muscle cramps and myalgias.  Gastrointestinal: Negative for abdominal pain, constipation, diarrhea, hematemesis, hematochezia, melena, nausea and vomiting.  Neurological: Negative for dizziness and light-headedness.    PHYSICAL EXAM: Vitals with BMI 03/10/2020 02/21/2020 02/21/2020  Height 5\' 4"  - -  Weight 174 lbs - -  BMI 37.16 - -  Systolic 967 893 810  Diastolic 64 66 61  Pulse 58 71 75    CONSTITUTIONAL: Age-appropriate female, hemodynamically stable, well-nourished. No acute distress.  SKIN: Skin is warm and dry. No rash  noted. No cyanosis. No pallor. No jaundice HEAD: Normocephalic and atraumatic.  EYES: No scleral icterus MOUTH/THROAT: Moist oral membranes.  NECK: No JVD present. No thyromegaly noted. No carotid bruits  LYMPHATIC: No visible cervical adenopathy.  CHEST Normal respiratory effort. No intercostal retractions  LUNGS: Clear to auscultation bilaterally no stridor. No wheezes. No rales.  CARDIOVASCULAR: Regular, positive S1-S2, no murmurs rubs or gallops appreciated. ABDOMINAL: Nonobese, soft, nontender, nondistended, positive bowel sounds in all 4 quadrants no apparent ascites.  EXTREMITIES: No peripheral edema  HEMATOLOGIC: No significant bruising NEUROLOGIC: Oriented to person, place, and time. Nonfocal. Normal muscle tone.  PSYCHIATRIC: Normal mood and affect. Normal behavior. Cooperative  CARDIAC DATABASE: EKG: 02/19/2020: Normal sinus rhythm, 66 bpm, LVH, probable old anteroseptal infarct, ST-T changes in the high lateral and lateral leads suggestive of possible anterolateral ischemia, QTC 494 ms, compared to prior EKG left bundle branch block is absent.  Echocardiogram: 02/17/2020: LVEF 30-35%, moderately decreased LVEF, global hypokinesis, abnormal septal motion consistent with LBBB, LV size mildly dilated, mild LVH, grade 2 diastolic impairment, mild MR, aortic valve sclerosis without stenosis.  Stress Testing:  NA  Heart Catheterization: Left Heart Catheterization 02/21/20:  There is severe left ventricular systolic dysfunction. The left ventricular ejection fraction is less than 25% by visual estimate. LV end diastolic pressure is normal. Normal coronary arteries, right dominant circulation.  LABORATORY DATA: CBC Latest Ref Rng & Units 02/19/2020 02/18/2020 02/17/2020  WBC 4.0 - 10.5 K/uL 7.5 7.7 -  Hemoglobin 12.0 - 15.0 g/dL 11.5(L) 10.5(L) 12.6  Hematocrit 36 - 46 % 35.4(L) 33.6(L) 37.0  Platelets 150 - 400 K/uL 267 245 -    CMP Latest Ref Rng & Units 02/21/2020 02/20/2020  02/19/2020  Glucose 70 - 99 mg/dL 87 88 114(H)  BUN 8 - 23 mg/dL 18 15 21   Creatinine 0.44 - 1.00 mg/dL 1.41(H) 0.95 1.10(H)  Sodium 135 - 145 mmol/L 136 138 139  Potassium 3.5 - 5.1 mmol/L 3.8 3.6 3.4(L)  Chloride 98 - 111 mmol/L 99 101 100  CO2 22 - 32 mmol/L 31 29 28   Calcium 8.9 - 10.3 mg/dL 8.7(L) 8.8(L) 8.8(L)  Total Protein 6.5 - 8.1 g/dL - - -  Total Bilirubin 0.3 - 1.2 mg/dL - - -  Alkaline Phos 38 - 126 U/L - - -  AST 15 - 41 U/L - - -  ALT 0 - 44 U/L - - -   Lipid  Panel     Component Value Date/Time   CHOL 236 (H) 02/18/2020 0602   TRIG 47 02/18/2020 0602   HDL 65 02/18/2020 0602   CHOLHDL 3.6 02/18/2020 0602   VLDL 9 02/18/2020 0602   LDLCALC 162 (H) 02/18/2020 0602    No results found for: HGBA1C No components found for: NTPROBNP Lab Results  Component Value Date   TSH 1.862 02/18/2020    Cardiac Panel (last 3 results) No results for input(s): CKTOTAL, CKMB, TROPONINIHS, RELINDX in the last 72 hours.  IMPRESSION:    ICD-10-CM   1. Chronic combined systolic and diastolic heart failure (HCC)  I50.42 EKG 12-Lead  2. Nonischemic cardiomyopathy (HCC)  I42.8   3. Mixed hyperlipidemia  E78.2   4. Hypertensive heart and kidney disease with HF and with CKD stage III (HCC)  I13.0    N18.30   5. LBBB (left bundle branch block)  I44.7   6. Hx of deep venous thrombosis  Z86.718      RECOMMENDATIONS: Rachel [name] is a 69 y.o. female whose past medical history and cardiovascular risk factors include: Chronic systolic and diastolic heart failure/stage C/NYHA class II, nonischemic cardiomyopathy, multiple system atrophy, asthma, history of left lower extremity DVT, hypertension, hyperlipidemia, hypothyroidism, left bundle branch block, advanced age, postmenopausal female.  Chronic systolic heart failure heart failure, stage C, NYHA class II:  Medications reconciled.  Discussed uptitrating Entresto.  However, patient is reluctant as she has been having episodes of  hypotension and she has underlying multiple system atrophy.  Plan was to continue the current medical therapy and if the creatinine function is back to baseline would recommend discontinuing Lasix and transitioning back to spironolactone 12.5 mg p.o. daily.  We will await the repeat blood work.  Also discussed undergoing echocardiogram and approximately 90 days since discharge to reevaluate LV function.  They verbalized understanding.  Nonischemic cardiomyopathy: See above  Hyperlipidemia:  Started on pravastatin on 02/18/2020.  Patient tolerated the medication well without any side effects or intolerances.   Left bundle branch block: Continue to monitor.  FINAL MEDICATION LIST END OF ENCOUNTER: No orders of the defined types were placed in this encounter.   There are no discontinued medications.   Current Outpatient Medications:  .  albuterol (PROVENTIL) (2.5 MG/3ML) 0.083% nebulizer solution, Take 3 mLs (2.5 mg total) by nebulization every 6 (six) hours as needed for wheezing or shortness of breath., Disp: 120 mL, Rfl: 11 .  amantadine (SYMMETREL) 100 MG capsule, TAKE 1 CAPSULE BY MOUTH THREE TIMES DAILY (Patient taking differently: Take 100 mg by mouth in the morning, at noon, and at bedtime. ), Disp: 180 capsule, Rfl: 0 .  BREO ELLIPTA 100-25 MCG/INH AEPB, Inhale 1 puff into the lungs daily., Disp: , Rfl:  .  Carbidopa-Levodopa ER (SINEMET CR) 25-100 MG tablet controlled release, TAKE 1 TABLET BY MOUTH SIX TIMES DAILY (Patient taking differently: Take 1 tablet by mouth 6 (six) times daily. ), Disp: 540 tablet, Rfl: 0 .  citalopram (CELEXA) 20 MG tablet, Take 20 mg by mouth daily., Disp: , Rfl:  .  fluticasone (FLONASE) 50 MCG/ACT nasal spray, Place 1 spray into both nostrils daily., Disp: 16 g, Rfl: 11 .  furosemide (LASIX) 20 MG tablet, Take 20 mg by mouth daily., Disp: , Rfl:  .  levothyroxine (SYNTHROID) 112 MCG tablet, Take 112 mcg by mouth daily., Disp: , Rfl:  .  LINZESS 290  MCG CAPS capsule, Take 290 mcg by mouth daily., Disp: , Rfl:  .  metoprolol succinate (TOPROL-XL) 50 MG 24 hr tablet, Take 1 tablet (50 mg total) by mouth daily. Take with or immediately following a meal., Disp: 30 tablet, Rfl: 0 .  montelukast (SINGULAIR) 10 MG tablet, Take 1 tablet (10 mg total) by mouth at bedtime., Disp: 30 tablet, Rfl: 11 .  Multiple Vitamin (MULTIVITAMIN WITH MINERALS) TABS tablet, Take 2 tablets by mouth daily., Disp: , Rfl:  .  ondansetron (ZOFRAN ODT) 4 MG disintegrating tablet, Take 1 tablet (4 mg total) by mouth every 8 (eight) hours as needed for nausea or vomiting., Disp: 20 tablet, Rfl: 0 .  pantoprazole (PROTONIX) 40 MG tablet, Take 40 mg by mouth at bedtime. , Disp: , Rfl:  .  pravastatin (PRAVACHOL) 40 MG tablet, Take 1 tablet (40 mg total) by mouth daily at 6 PM., Disp: 30 tablet, Rfl: 0 .  sacubitril-valsartan (ENTRESTO) 24-26 MG, Take 1 tablet by mouth 2 (two) times daily., Disp: 60 tablet, Rfl: 0 .  spironolactone (ALDACTONE) 25 MG tablet, Take 0.5 tablets (12.5 mg total) by mouth daily., Disp: 15 tablet, Rfl: 0 .  tamsulosin (FLOMAX) 0.4 MG CAPS capsule, Take 1 capsule (0.4 mg total) by mouth daily., Disp: 7 capsule, Rfl: 0 .  VITAMIN D PO, Take 2 drops by mouth daily., Disp: , Rfl:  .  vitamin E 100 UNIT capsule, Take 200 Units by mouth daily. , Disp: , Rfl:  .  VOLTAREN 1 % GEL, Apply 1 application topically 4 (four) times daily as needed (pain). , Disp: , Rfl:   Current Facility-Administered Medications:  .  mometasone-formoterol (DULERA) 100-5 MCG/ACT inhaler 2 puff, 2 puff, Inhalation, BID, Spero Geralds, MD  Orders Placed This Encounter  Procedures  . EKG 12-Lead   --Continue cardiac medications as reconciled in final medication list. --Return in about 4 weeks (around 04/07/2020) for heart failure management.. Or sooner if needed. --Continue follow-up with your primary care physician regarding the management of your other chronic comorbid  conditions.  Patient's questions and concerns were addressed to her satisfaction. She voices understanding of the instructions provided during this encounter.   This note was created using a voice recognition software as a result there may be grammatical errors inadvertently enclosed that do not reflect the nature of this encounter. Every attempt is made to correct such errors.  Rex Kras, [place], [facility]  Pager: [phone] Office: [phone]

## 2020-03-11 LAB — BASIC METABOLIC PANEL
BUN/Creatinine Ratio: 17 (ref 12–28)
BUN: 25 mg/dL (ref 8–27)
CO2: 27 mmol/L (ref 20–29)
Calcium: 9.5 mg/dL (ref 8.7–10.3)
Chloride: 99 mmol/L (ref 96–106)
Creatinine, Ser: 1.43 mg/dL — ABNORMAL HIGH (ref 0.57–1.00)
GFR calc Af Amer: 43 mL/min/{1.73_m2} — ABNORMAL LOW (ref >59)
GFR calc non Af Amer: 37 mL/min/{1.73_m2} — ABNORMAL LOW (ref >59)
Glucose: 112 mg/dL — ABNORMAL HIGH (ref 65–99)
Potassium: 4 mmol/L (ref 3.5–5.2)
Sodium: 140 mmol/L (ref 134–144)

## 2020-03-11 LAB — MAGNESIUM: Magnesium: 1.8 mg/dL (ref 1.6–2.3)

## 2020-03-11 LAB — PRO B NATRIURETIC PEPTIDE: NT-Pro BNP: 2304 pg/mL — ABNORMAL HIGH (ref 0–301)

## 2020-03-12 ENCOUNTER — Other Ambulatory Visit: Payer: Self-pay | Admitting: Cardiology

## 2020-03-12 DIAGNOSIS — I5042 Chronic combined systolic (congestive) and diastolic (congestive) heart failure: Secondary | ICD-10-CM

## 2020-03-12 NOTE — Progress Notes (Signed)
Repeat blood work shows that her serum creatinine still remains elevated but overall stable compared to prior value.  Recommending holding Lasix. Hold spironolactone.  Hopefully by decreasing diuretic therapy her MAP will be higher for renal perfusion and improve kidney function.   Rex Kras, [place], [facility]  Pager: [phone] Office: [phone]

## 2020-03-13 ENCOUNTER — Telehealth: Payer: Self-pay

## 2020-03-13 NOTE — Telephone Encounter (Signed)
-----   Message from [place], [place] sent at 03/12/2020 11:55 AM EDT ----- Have the patient increase her fluid intake by 1 or 2 glasses of water per day. Have her hold Lasix and spironolactone for now.Repeat blood work in 1 week to see if serum creatinine improves.

## 2020-03-13 NOTE — Telephone Encounter (Signed)
Called pt to inform her to increase her water to 1-2 glasses a day and to stop lasix and spironolactone and to repeat blood work in one week pt understood

## 2020-03-14 NOTE — Progress Notes (Signed)
Informed patient to increase water intake, stop lasix and spironolactone, and get repeat blood work in 1 week. She expressed understanding and agreement.

## 2020-03-15 ENCOUNTER — Telehealth (HOSPITAL_COMMUNITY): Payer: Self-pay

## 2020-03-15 ENCOUNTER — Other Ambulatory Visit (HOSPITAL_COMMUNITY): Payer: Self-pay

## 2020-03-15 NOTE — Telephone Encounter (Signed)
Called and spoke with pt stating to her at last OV, it was stated for her to continue on Breo. Pt verbalized understanding and stated she will do as stated at last OV. Pt stated that the [facility] has been working for her. Nothing further needed.

## 2020-03-15 NOTE — Telephone Encounter (Addendum)
Received a telephone call from the patient requesting a refill for her [facility] prescription. She has the inhaler that was provided to her during hospital admission but this was not included on discharge summary.  Will contact MD to clarify if she should continue this. If so, will send refill to [facility].   Vertis Kelch, PharmD, BCPS Heart Failure Stewardship Pharmacist Phone [phone] 03/15/2020       10:09 AM

## 2020-03-15 NOTE — Telephone Encounter (Addendum)
Pt called requesting refill for [facility] inhaler. If appropriate, please send new prescription to [facility]  Vertis Kelch, PharmD, BCPS Heart Failure Stewardship Pharmacist Phone [phone]

## 2020-03-22 ENCOUNTER — Telehealth (HOSPITAL_COMMUNITY): Payer: Self-pay

## 2020-03-22 DIAGNOSIS — I5042 Chronic combined systolic (congestive) and diastolic (congestive) heart failure: Secondary | ICD-10-CM | POA: Diagnosis not present

## 2020-03-22 NOTE — Telephone Encounter (Signed)
Patient called requesting refills for medications. She stated she only needed refills for pravastatin and Dulera.  See previous telephone encounter for [facility]   Pharmacy has prescription for pravastatin.  Patient verbalized understanding.

## 2020-03-23 LAB — BASIC METABOLIC PANEL WITH GFR
BUN/Creatinine Ratio: 9 — ABNORMAL LOW (ref 12–28)
BUN: 10 mg/dL (ref 8–27)
CO2: 27 mmol/L (ref 20–29)
Calcium: 9.3 mg/dL (ref 8.7–10.3)
Chloride: 102 mmol/L (ref 96–106)
Creatinine, Ser: 1.12 mg/dL — ABNORMAL HIGH (ref 0.57–1.00)
GFR calc Af Amer: 58 mL/min/1.73 — ABNORMAL LOW (ref >59)
GFR calc non Af Amer: 50 mL/min/1.73 — ABNORMAL LOW (ref >59)
Glucose: 84 mg/dL (ref 65–99)
Potassium: 3.8 mmol/L (ref 3.5–5.2)
Sodium: 142 mmol/L (ref 134–144)

## 2020-03-23 LAB — PRO B NATRIURETIC PEPTIDE: NT-Pro BNP: 4562 pg/mL — ABNORMAL HIGH (ref 0–301)

## 2020-03-23 LAB — MAGNESIUM: Magnesium: 2 mg/dL (ref 1.6–2.3)

## 2020-03-24 NOTE — Progress Notes (Signed)
Left vm to cb.

## 2020-03-27 NOTE — Progress Notes (Signed)
Virtual Visit Via Video   The purpose of this virtual visit is to provide medical care while limiting exposure to the novel coronavirus.    Consent was obtained for video visit:  Yes.   Answered questions that patient had about telehealth interaction:  Yes.   I discussed the limitations, risks, security and privacy concerns of performing an evaluation and management service by telemedicine. I also discussed with the patient that there may be a patient responsible charge related to this service. The patient expressed understanding and agreed to proceed.  Pt location: Home Physician Location: office Name of referring provider:  Marton Redwood, MD I connected with [name] at patients initiation/request on 03/29/2020 at 11:15 AM EDT by video enabled telemedicine application and verified that I am speaking with the correct person using two identifiers. Pt MRN:  [id number] Pt DOB:  1951/01/10 Video Participants:  [name];    Assessment/Plan:   1.  MSA  -Continue carbidopa/levodopa 25/100 CR, 1 tablet 6 times per day  -Continue amantadine, 100 mg 3 times per day  2.  Memory change  -Had previously canceled her neurocognitive testing scheduled for April, 2021.  3.  Diplopia  -Saw Dr. 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  Dysphagia  -Patient with mbe in 10/2019 with recommendations for reg diet with thin liquid Subjective   Patient seen today in follow-up for MSA on levodopa.  Pt denies falls.  Pt denies lightheadedness, near syncope.  No hallucinations.  Mood has been good.  Outside records that were made available to me were reviewed.  Patient was complaining about memory change last visit.  She was scheduled for neurocognitive testing in April but canceled it.   Patient was in the hospital at the end of July for CAP.  Pt states that it turns out it wasn't CAP but rather CHF.  She states that she is doing better.   She moved in with son and his wife and "I use the stairs."  She does feel that she is safe.  No falls.  She is reading for activity.  Going to church for activity.    Current movement d/o meds:  Amantadine, 100 mg 3 times per day (increased) Carbidopa/levodopa 25/100 CR, 1 tablet 6 times per day   Current Outpatient Medications on File Prior to Visit  Medication Sig Dispense Refill  . albuterol (PROVENTIL) (2.5 MG/3ML) 0.083% nebulizer solution Take 3 mLs (2.5 mg total) by nebulization every 6 (six) hours as needed for wheezing or shortness of breath. 120 mL 11  . amantadine (SYMMETREL) 100 MG capsule TAKE 1 CAPSULE BY MOUTH THREE TIMES DAILY (Patient taking differently: Take 100 mg by mouth in the morning, at noon, and at bedtime. ) 180 capsule 0  . BREO ELLIPTA 100-25 MCG/INH AEPB Inhale 1 puff into the lungs daily.    . Carbidopa-Levodopa ER (SINEMET CR) 25-100 MG tablet controlled release TAKE 1 TABLET BY MOUTH SIX TIMES DAILY (Patient taking differently: Take 1 tablet by mouth 6 (six) times daily. ) 540 tablet 0  . citalopram (CELEXA) 20 MG tablet Take 20 mg by mouth daily.    . fluticasone (FLONASE) 50 MCG/ACT nasal spray Place 1 spray into both nostrils daily. 16 g 11  . furosemide (LASIX) 20 MG tablet Take 20 mg by mouth daily.    Marland Kitchen levothyroxine (SYNTHROID) 112 MCG tablet Take 112 mcg by mouth daily.    Marland Kitchen LINZESS 290 MCG  CAPS capsule Take 290 mcg by mouth daily.    . metoprolol succinate (TOPROL-XL) 50 MG 24 hr tablet Take 1 tablet (50 mg total) by mouth daily. Take with or immediately following a meal. 30 tablet 0  . montelukast (SINGULAIR) 10 MG tablet Take 1 tablet (10 mg total) by mouth at bedtime. 30 tablet 11  . Multiple Vitamin (MULTIVITAMIN WITH MINERALS) TABS tablet Take 2 tablets by mouth daily.    . ondansetron (ZOFRAN ODT) 4 MG disintegrating tablet Take 1 tablet (4 mg total) by mouth every 8 (eight) hours as needed for nausea or vomiting. 20 tablet 0  . pantoprazole  (PROTONIX) 40 MG tablet Take 40 mg by mouth at bedtime.     . pravastatin (PRAVACHOL) 40 MG tablet Take 1 tablet (40 mg total) by mouth daily at 6 PM. 30 tablet 0  . sacubitril-valsartan (ENTRESTO) 24-26 MG Take 1 tablet by mouth 2 (two) times daily. 60 tablet 0  . spironolactone (ALDACTONE) 25 MG tablet Take 0.5 tablets (12.5 mg total) by mouth daily. 15 tablet 0  . tamsulosin (FLOMAX) 0.4 MG CAPS capsule Take 1 capsule (0.4 mg total) by mouth daily. 7 capsule 0  . VITAMIN D PO Take 2 drops by mouth daily.    . vitamin E 100 UNIT capsule Take 200 Units by mouth daily.     . VOLTAREN 1 % GEL Apply 1 application topically 4 (four) times daily as needed (pain).      Current Facility-Administered Medications on File Prior to Visit  Medication Dose Route Frequency Provider Last Rate Last Admin  . mometasone-formoterol (DULERA) 100-5 MCG/ACT inhaler 2 puff  2 puff Inhalation BID Spero Geralds, MD         Objective   There were no vitals filed for this visit. GEN:  The patient appears stated age and is in NAD.  Neurological examination:  Orientation: The patient is alert and oriented x3. Cranial nerves: There is good facial symmetry. There is no facial hypomimia.  The speech is fluent and clear. Soft palate rises symmetrically and there is no tongue deviation. Hearing is intact to conversational tone. Motor: Strength is at least antigravity x 4.   Shoulder shrug is equal and symmetric.  There is no pronator drift.  Movement examination: Tone: unable Abnormal movements: none seen  Coordination:  There is no decremation with RAM's, but camera quality poor and it froze when looking at L hand Gait and Station: unable on video     Follow up Instructions      -I discussed the assessment and treatment plan with the patient. The patient was provided an opportunity to ask questions and all were answered. The patient agreed with the plan and demonstrated an understanding of the instructions.    The patient was advised to call back or seek an in-person evaluation if the symptoms worsen or if the condition fails to improve as anticipated.    Total time spent on today's visit was 53minutes, including both face-to-face time and nonface-to-face time.  Time included that spent on review of records (prior notes available to me/labs/imaging if pertinent), discussing treatment and goals, answering patient's questions and coordinating care.   Alonza Bogus, DO

## 2020-03-29 ENCOUNTER — Encounter: Payer: Self-pay | Admitting: Neurology

## 2020-03-29 ENCOUNTER — Telehealth: Payer: Self-pay

## 2020-03-29 ENCOUNTER — Other Ambulatory Visit: Payer: Self-pay

## 2020-03-29 ENCOUNTER — Telehealth (INDEPENDENT_AMBULATORY_CARE_PROVIDER_SITE_OTHER): Payer: Medicare Other | Admitting: Neurology

## 2020-03-29 VITALS — Ht 65.0 in | Wt 173.0 lb

## 2020-03-29 DIAGNOSIS — G232 Striatonigral degeneration: Secondary | ICD-10-CM | POA: Diagnosis not present

## 2020-03-29 NOTE — Telephone Encounter (Signed)
Patient said that her PCP stopped the Spironolactone due to low BP issues. She has been monitoring her bp at home and it has been good 106/60's

## 2020-03-29 NOTE — Telephone Encounter (Signed)
Okay 

## 2020-03-31 DIAGNOSIS — M9901 Segmental and somatic dysfunction of cervical region: Secondary | ICD-10-CM | POA: Diagnosis not present

## 2020-03-31 DIAGNOSIS — M5030 Other cervical disc degeneration, unspecified cervical region: Secondary | ICD-10-CM | POA: Diagnosis not present

## 2020-03-31 DIAGNOSIS — M5136 Other intervertebral disc degeneration, lumbar region: Secondary | ICD-10-CM | POA: Diagnosis not present

## 2020-03-31 DIAGNOSIS — M9903 Segmental and somatic dysfunction of lumbar region: Secondary | ICD-10-CM | POA: Diagnosis not present

## 2020-04-07 NOTE — Telephone Encounter (Signed)
My discussion with patient on 03/31/2020  "BP have improved with SBP 120-167mmHg (she stopped lasix and aldactone).  Patient states that she is more short of breath and swollen. I have asked her to restart Aldactone at 12.5 mg p.o. daily. Hold Lasix for now. Patient is asked to call the office if she starts seeing increasing her weight by more than 2 pounds in 24 hours or more than 3 pounds over a week."

## 2020-04-07 NOTE — Telephone Encounter (Signed)
Patient called requesting refill for spironolactone. She states Dr. Terri Skains has asked her to resume this. If appropriate, please send new prescription to pharmacy.

## 2020-04-10 ENCOUNTER — Encounter: Payer: Self-pay | Admitting: Cardiology

## 2020-04-10 ENCOUNTER — Other Ambulatory Visit: Payer: Self-pay

## 2020-04-10 ENCOUNTER — Ambulatory Visit: Payer: Medicare Other | Admitting: Cardiology

## 2020-04-10 VITALS — BP 131/53 | HR 60 | Ht 65.0 in | Wt 172.0 lb

## 2020-04-10 DIAGNOSIS — I5042 Chronic combined systolic (congestive) and diastolic (congestive) heart failure: Secondary | ICD-10-CM

## 2020-04-10 DIAGNOSIS — I130 Hypertensive heart and chronic kidney disease with heart failure and stage 1 through stage 4 chronic kidney disease, or unspecified chronic kidney disease: Secondary | ICD-10-CM

## 2020-04-10 DIAGNOSIS — E782 Mixed hyperlipidemia: Secondary | ICD-10-CM | POA: Diagnosis not present

## 2020-04-10 DIAGNOSIS — I428 Other cardiomyopathies: Secondary | ICD-10-CM | POA: Diagnosis not present

## 2020-04-10 DIAGNOSIS — N183 Chronic kidney disease, stage 3 unspecified: Secondary | ICD-10-CM

## 2020-04-10 DIAGNOSIS — I447 Left bundle-branch block, unspecified: Secondary | ICD-10-CM

## 2020-04-10 DIAGNOSIS — Z86718 Personal history of other venous thrombosis and embolism: Secondary | ICD-10-CM

## 2020-04-10 MED ORDER — SPIRONOLACTONE 25 MG PO TABS: 12.5000 mg | ORAL_TABLET | Freq: Every morning | ORAL | 0 refills | Status: DC

## 2020-04-10 MED ORDER — SACUBITRIL-VALSARTAN 24-26 MG PO TABS: 2.0000 | ORAL_TABLET | Freq: Two times a day (BID) | ORAL | 0 refills | Status: DC

## 2020-04-10 NOTE — Progress Notes (Signed)
Rachel [name] Date of Birth: 01-02-1951 MRN: [id number] Primary Care Provider:Shaw, Gwyndolyn Saxon, MD Primary Cardiologist: Rex Kras, DO, [facility] (established care 03/10/2020)  Date: 04/10/20 Last Office Visit: 03/10/2020  Chief Complaint  Patient presents with  . Congestive Heart Failure    HPI  Rachel [name] is a 69 y.o.  female who presents to the office with a chief complaint of " heart failure management." Patient's past medical history and cardiovascular risk factors include: Chronic systolic and diastolic heart failure/stage C/NYHA class II, nonischemic cardiomyopathy, multiple system atrophy, asthma, history of left lower extremity DVT, hypertension, hyperlipidemia, hypothyroidism, left bundle branch block, advanced age, postmenopausal female.  Patient is accompanied by her husband at today's office visit.  Patient presented to the hospital back in July 2021 for symptoms of shortness of breath.  She was diagnosed with newly discovered cardiomyopathy and  systolic and diastolic heart failure she was started on guideline directed medical therapy.  Due to moderately reduced LVEF, left bundle branch block, and chest pain she also underwent left heart catheterization and was noted to have normal epicardial coronary arteries.  Since then patient has been followed for up titration of guideline directed medical therapy.  In the past patient was a started on Entresto and spironolactone.  However, given her underlying multiple system atrophy she has symptoms of lightheaded and dizziness and therefore difficult to uptitrate guideline directed medical therapy.  For period of time patient has stopped taking his diuretic therapy because of low blood pressures and feeling lightheaded and dizzy.  However, her NT proBNP started to uptrend and she was noticing more swelling in her legs and becoming symptomatic.  Therefore the shared decision was to proceed with reinitiation of spironolactone 12.5 mg p.o. twice  daily.  Which she has tolerated well.  Today patient states that she feels better in regards to less short of breath with physical exertion, she has lost 2 pounds since last office visit, no recent hospitalizations or urgent care visits for cardiovascular symptoms.  ALLERGIES: Allergies  Allergen Reactions  . Fosamax [Alendronate] Other (See Comments)    headache  . Symbicort [Budesonide-Formoterol Fumarate] Other (See Comments)    Shakes and muscle weakness   MEDICATION LIST PRIOR TO VISIT: Current Outpatient Medications on File Prior to Visit  Medication Sig Dispense Refill  . albuterol (PROVENTIL) (2.5 MG/3ML) 0.083% nebulizer solution Take 3 mLs (2.5 mg total) by nebulization every 6 (six) hours as needed for wheezing or shortness of breath. 120 mL 11  . amantadine (SYMMETREL) 100 MG capsule TAKE 1 CAPSULE BY MOUTH THREE TIMES DAILY 180 capsule 0  . Carbidopa-Levodopa ER (SINEMET CR) 25-100 MG tablet controlled release TAKE 1 TABLET BY MOUTH SIX TIMES DAILY 540 tablet 0  . citalopram (CELEXA) 20 MG tablet Take 20 mg by mouth daily.    . DULERA 100-5 MCG/ACT AERO Inhale 2 puffs into the lungs in the morning and at bedtime.    . fluticasone (FLONASE) 50 MCG/ACT nasal spray Place 1 spray into both nostrils daily. 16 g 11  . levothyroxine (SYNTHROID) 112 MCG tablet Take 112 mcg by mouth daily.    Marland Kitchen LINZESS 290 MCG CAPS capsule Take 290 mcg by mouth daily.    . metoprolol succinate (TOPROL-XL) 50 MG 24 hr tablet Take 1 tablet (50 mg total) by mouth daily. Take with or immediately following a meal. 30 tablet 0  . montelukast (SINGULAIR) 10 MG tablet Take 1 tablet (10 mg total) by mouth at bedtime. 30 tablet 11  .  Multiple Vitamin (MULTIVITAMIN WITH MINERALS) TABS tablet Take 2 tablets by mouth daily.     . NON FORMULARY Take by mouth. HEMP OIL CBD    . ondansetron (ZOFRAN ODT) 4 MG disintegrating tablet Take 1 tablet (4 mg total) by mouth every 8 (eight) hours as needed for nausea or  vomiting. 20 tablet 0  . pantoprazole (PROTONIX) 40 MG tablet Take 40 mg by mouth at bedtime.     . pravastatin (PRAVACHOL) 40 MG tablet Take 1 tablet (40 mg total) by mouth daily at 6 PM. 30 tablet 0  . VITAMIN D PO Take 2 drops by mouth daily.    . vitamin E 100 UNIT capsule Take 200 Units by mouth daily.     . VOLTAREN 1 % GEL Apply 1 application topically 4 (four) times daily as needed (pain).      Current Facility-Administered Medications on File Prior to Visit  Medication Dose Route Frequency Provider Last Rate Last Admin  . mometasone-formoterol (DULERA) 100-5 MCG/ACT inhaler 2 puff  2 puff Inhalation BID Spero Geralds, MD        PAST MEDICAL HISTORY: Past Medical History:  Diagnosis Date  . Allergic rhinitis   . Anemia   . Arthritis   . Asthma   . Blood clot in vein  age 24   left leg  . Depression   . GERD (gastroesophageal reflux disease)   . Hyperlipidemia   . Hypertension    pt denies, in physician record  . Hypothyroidism   . Kidney stones   . Multiple pregnancy loss, not currently pregnant    multiple miscarriages-pt.unsure of #  . Multiple system atrophy ([place]) 2015  . Nephrolithiasis    hx of kidney stones, last 6 months ago  . Osteopenia   . Osteoporosis   . Parkinsonism ([place])   . Tendinitis    left foot  . Vitamin D deficiency     PAST SURGICAL HISTORY: Past Surgical History:  Procedure Laterality Date  . BREAST EXCISIONAL BIOPSY Right   . BREAST SURGERY     rt. lumpectomy, benign  . colonscopy  2011  . cystoscopy  2012  . JOINT REPLACEMENT  05-2007   right knee replacment/revision  . KIDNEY STONE SURGERY    . LEFT HEART CATH AND CORONARY ANGIOGRAPHY N/A 02/21/2020   Procedure: LEFT HEART CATH AND CORONARY ANGIOGRAPHY;  Surgeon: Adrian Prows, MD;  Location: [facility];  Service: Cardiovascular;  Laterality: N/A;  . REPLACEMENT TOTAL KNEE Left 06/05/12  . TOTAL KNEE REVISION  06/03/2012   Procedure: TOTAL KNEE REVISION;  Surgeon: Gearlean Alf, MD;  Location: [facility];  Service: Orthopedics;  Laterality: Left;  Revision of a Left Uni Knee to a Total Knee Arthroplasty    FAMILY HISTORY: The patient's family history includes Asthma in her sister; Breast cancer in her sister; Diabetes in her mother; Heart attack in her brother; Heart failure in her father, mother, and sister; Thyroid disease in her mother.   SOCIAL HISTORY:  The patient  reports that she has never smoked. She has never used smokeless tobacco. She reports that she does not drink alcohol and does not use drugs.  Review of Systems  Constitutional: Negative for chills and fever.  HENT: Negative for hoarse voice and nosebleeds.   Eyes: Negative for discharge, double vision and pain.  Cardiovascular: Positive for leg swelling. Negative for chest pain, claudication, dyspnea on exertion, near-syncope, orthopnea, palpitations, paroxysmal nocturnal dyspnea and syncope.  Respiratory:  Positive for shortness of breath (improving). Negative for hemoptysis.   Musculoskeletal: Negative for muscle cramps and myalgias.  Gastrointestinal: Negative for abdominal pain, constipation, diarrhea, hematemesis, hematochezia, melena, nausea and vomiting.    PHYSICAL EXAM: Vitals with BMI 04/10/2020 03/29/2020 03/10/2020  Height 5\' 5"  5\' 5"  5\' 4"   Weight 172 lbs 173 lbs 174 lbs  BMI 28.62 48.54 62.70  Systolic 350 - 093  Diastolic 53 - 64  Pulse 60 - 58    CONSTITUTIONAL: Age-appropriate female, hemodynamically stable, well-nourished. No acute distress.  SKIN: Skin is warm and dry. No rash noted. No cyanosis. No pallor. No jaundice HEAD: Normocephalic and atraumatic.  EYES: No scleral icterus MOUTH/THROAT: Moist oral membranes.  NECK: No JVD present. No thyromegaly noted. No carotid bruits  LYMPHATIC: No visible cervical adenopathy.  CHEST Normal respiratory effort. No intercostal retractions  LUNGS: Clear to auscultation bilaterally no stridor. No wheezes. No rales.   CARDIOVASCULAR: Regular, positive S1-S2, no murmurs rubs or gallops appreciated. ABDOMINAL: Nonobese, soft, nontender, nondistended, positive bowel sounds in all 4 quadrants no apparent ascites.  EXTREMITIES: Bilateral +1 pitting edema, tender to touch bilaterally. HEMATOLOGIC: No significant bruising NEUROLOGIC: Oriented to person, place, and time. Nonfocal. Normal muscle tone.  PSYCHIATRIC: Normal mood and affect. Normal behavior. Cooperative  CARDIAC DATABASE: EKG: 02/19/2020: Normal sinus rhythm, 66 bpm, LVH, probable old anteroseptal infarct, ST-T changes in the high lateral and lateral leads suggestive of possible anterolateral ischemia, QTC 494 ms, compared to prior EKG left bundle branch block is absent.  Echocardiogram: 02/17/2020: LVEF 30-35%, moderately decreased LVEF, global hypokinesis, abnormal septal motion consistent with LBBB, LV size mildly dilated, mild LVH, grade 2 diastolic impairment, mild MR, aortic valve sclerosis without stenosis.  Stress Testing:  NA  Heart Catheterization: Left Heart Catheterization 02/21/20:  There is severe left ventricular systolic dysfunction. The left ventricular ejection fraction is less than 25% by visual estimate. LV end diastolic pressure is normal. Normal coronary arteries, right dominant circulation.  LABORATORY DATA: CBC Latest Ref Rng & Units 02/19/2020 02/18/2020 02/17/2020  WBC 4.0 - 10.5 K/uL 7.5 7.7 -  Hemoglobin 12.0 - 15.0 g/dL 11.5(L) 10.5(L) 12.6  Hematocrit 36 - 46 % 35.4(L) 33.6(L) 37.0  Platelets 150 - 400 K/uL 267 245 -    CMP Latest Ref Rng & Units 03/22/2020 03/10/2020 02/21/2020  Glucose 65 - 99 mg/dL 84 112(H) 87  BUN 8 - 27 mg/dL 10 25 18   Creatinine 0.57 - 1.00 mg/dL 1.12(H) 1.43(H) 1.41(H)  Sodium 134 - 144 mmol/L 142 140 136  Potassium 3.5 - 5.2 mmol/L 3.8 4.0 3.8  Chloride 96 - 106 mmol/L 102 99 99  CO2 20 - 29 mmol/L 27 27 31   Calcium 8.7 - 10.3 mg/dL 9.3 9.5 8.7(L)  Total Protein 6.5 - 8.1 g/dL - - -   Total Bilirubin 0.3 - 1.2 mg/dL - - -  Alkaline Phos 38 - 126 U/L - - -  AST 15 - 41 U/L - - -  ALT 0 - 44 U/L - - -   Lipid Panel     Component Value Date/Time   CHOL 236 (H) 02/18/2020 0602   TRIG 47 02/18/2020 0602   HDL 65 02/18/2020 0602   CHOLHDL 3.6 02/18/2020 0602   VLDL 9 02/18/2020 0602   LDLCALC 162 (H) 02/18/2020 0602    No results found for: HGBA1C No components found for: NTPROBNP Lab Results  Component Value Date   TSH 1.862 02/18/2020    Cardiac Panel (last 3 results) No  results for input(s): CKTOTAL, CKMB, TROPONINIHS, RELINDX in the last 72 hours.  IMPRESSION:    ICD-10-CM   1. Chronic combined systolic and diastolic heart failure (HCC)  I50.42 spironolactone (ALDACTONE) 25 MG tablet    sacubitril-valsartan (ENTRESTO) 24-26 MG    Magnesium    Pro b natriuretic peptide (BNP)    Basic metabolic panel    Basic metabolic panel    Pro b natriuretic peptide (BNP)    Magnesium    PCV ECHOCARDIOGRAM COMPLETE  2. Nonischemic cardiomyopathy (HCC)  I42.8 sacubitril-valsartan (ENTRESTO) 24-26 MG    Magnesium    Pro b natriuretic peptide (BNP)    Basic metabolic panel    Basic metabolic panel    Pro b natriuretic peptide (BNP)    Magnesium    PCV ECHOCARDIOGRAM COMPLETE  3. Mixed hyperlipidemia  E78.2   4. Hypertensive heart and kidney disease with HF and with CKD stage III (HCC)  I13.0    N18.30   5. LBBB (left bundle branch block)  I44.7   6. Hx of deep venous thrombosis  Z86.718      RECOMMENDATIONS: Rachel [name] is a 69 y.o. female whose past medical history and cardiovascular risk factors include: Chronic systolic and diastolic heart failure/stage C/NYHA class II, nonischemic cardiomyopathy, multiple system atrophy, asthma, history of left lower extremity DVT, hypertension, hyperlipidemia, hypothyroidism, left bundle branch block, advanced age, postmenopausal female.  Chronic systolic heart failure heart failure, stage C, NYHA class  II:  Medications reconciled.  Refill spironolactone at 12.5 mg p.o. every morning.  We will increase Entresto.  Patient currently has good amount of 24/26 mg tablets.  She is asked to take 2 tablets of 24/26 mg p.o. twice daily for 1 week and to check her blood work thereafter to evaluate kidney function and electrolytes.  If kidney function electrolytes are stable and patient is able to tolerate the medication we will transition her to Entresto 49/51 mg p.o. twice daily.  Sample bottle of Entresto 24/26 mg has also been provided so the patient did not run out of medications.  We will also order an echocardiogram for October 2021 to reevaluate LV function.  I will see her back in November 2021.  Nonischemic cardiomyopathy: See above  Hyperlipidemia:  Started on pravastatin on 02/18/2020.  Patient tolerated the medication well without any side effects or intolerances.   Left bundle branch block: Continue to monitor.  FINAL MEDICATION LIST END OF ENCOUNTER: Meds ordered this encounter  Medications  . spironolactone (ALDACTONE) 25 MG tablet    Sig: Take 0.5 tablets (12.5 mg total) by mouth in the morning.    Dispense:  45 tablet    Refill:  0  . sacubitril-valsartan (ENTRESTO) 24-26 MG    Sig: Take 2 tablets by mouth 2 (two) times daily.    Dispense:  60 tablet    Refill:  0    Medications Discontinued During This Encounter  Medication Reason  . metoprolol succinate (TOPROL-XL) 50 MG 24 hr tablet Duplicate  . furosemide (LASIX) 20 MG tablet Patient Preference  . sacubitril-valsartan (ENTRESTO) 24-26 MG   . spironolactone (ALDACTONE) 25 MG tablet Reorder     Current Outpatient Medications:  .  albuterol (PROVENTIL) (2.5 MG/3ML) 0.083% nebulizer solution, Take 3 mLs (2.5 mg total) by nebulization every 6 (six) hours as needed for wheezing or shortness of breath., Disp: 120 mL, Rfl: 11 .  amantadine (SYMMETREL) 100 MG capsule, TAKE 1 CAPSULE BY MOUTH THREE TIMES DAILY, Disp: 180  capsule,  Rfl: 0 .  Carbidopa-Levodopa ER (SINEMET CR) 25-100 MG tablet controlled release, TAKE 1 TABLET BY MOUTH SIX TIMES DAILY, Disp: 540 tablet, Rfl: 0 .  citalopram (CELEXA) 20 MG tablet, Take 20 mg by mouth daily., Disp: , Rfl:  .  DULERA 100-5 MCG/ACT AERO, Inhale 2 puffs into the lungs in the morning and at bedtime., Disp: , Rfl:  .  fluticasone (FLONASE) 50 MCG/ACT nasal spray, Place 1 spray into both nostrils daily., Disp: 16 g, Rfl: 11 .  levothyroxine (SYNTHROID) 112 MCG tablet, Take 112 mcg by mouth daily., Disp: , Rfl:  .  LINZESS 290 MCG CAPS capsule, Take 290 mcg by mouth daily., Disp: , Rfl:  .  metoprolol succinate (TOPROL-XL) 50 MG 24 hr tablet, Take 1 tablet (50 mg total) by mouth daily. Take with or immediately following a meal., Disp: 30 tablet, Rfl: 0 .  montelukast (SINGULAIR) 10 MG tablet, Take 1 tablet (10 mg total) by mouth at bedtime., Disp: 30 tablet, Rfl: 11 .  Multiple Vitamin (MULTIVITAMIN WITH MINERALS) TABS tablet, Take 2 tablets by mouth daily. , Disp: , Rfl:  .  NON FORMULARY, Take by mouth. HEMP OIL CBD, Disp: , Rfl:  .  ondansetron (ZOFRAN ODT) 4 MG disintegrating tablet, Take 1 tablet (4 mg total) by mouth every 8 (eight) hours as needed for nausea or vomiting., Disp: 20 tablet, Rfl: 0 .  pantoprazole (PROTONIX) 40 MG tablet, Take 40 mg by mouth at bedtime. , Disp: , Rfl:  .  pravastatin (PRAVACHOL) 40 MG tablet, Take 1 tablet (40 mg total) by mouth daily at 6 PM., Disp: 30 tablet, Rfl: 0 .  sacubitril-valsartan (ENTRESTO) 24-26 MG, Take 2 tablets by mouth 2 (two) times daily., Disp: 60 tablet, Rfl: 0 .  spironolactone (ALDACTONE) 25 MG tablet, Take 0.5 tablets (12.5 mg total) by mouth in the morning., Disp: 45 tablet, Rfl: 0 .  VITAMIN D PO, Take 2 drops by mouth daily., Disp: , Rfl:  .  vitamin E 100 UNIT capsule, Take 200 Units by mouth daily. , Disp: , Rfl:  .  VOLTAREN 1 % GEL, Apply 1 application topically 4 (four) times daily as needed (pain). , Disp: ,  Rfl:   Current Facility-Administered Medications:  .  mometasone-formoterol (DULERA) 100-5 MCG/ACT inhaler 2 puff, 2 puff, Inhalation, BID, Spero Geralds, MD  Orders Placed This Encounter  Procedures  . Magnesium  . Pro b natriuretic peptide (BNP)  . Basic metabolic panel  . PCV ECHOCARDIOGRAM COMPLETE   --Continue cardiac medications as reconciled in final medication list. --Return in about 8 weeks (around 06/05/2020) for heart failure management., Review test results. Or sooner if needed. --Continue follow-up with your primary care physician regarding the management of your other chronic comorbid conditions.  Patient's questions and concerns were addressed to her satisfaction. She voices understanding of the instructions provided during this encounter.   This note was created using a voice recognition software as a result there may be grammatical errors inadvertently enclosed that do not reflect the nature of this encounter. Every attempt is made to correct such errors.  Rex Kras, [place], [facility]  Pager: [phone] Office: [phone]

## 2020-04-11 ENCOUNTER — Encounter: Payer: Self-pay | Admitting: Allergy and Immunology

## 2020-04-11 ENCOUNTER — Ambulatory Visit: Payer: Medicare Other | Admitting: Allergy and Immunology

## 2020-04-11 VITALS — BP 98/62 | HR 66 | Temp 98.1°F | Resp 16 | Ht 65.0 in | Wt 176.0 lb

## 2020-04-11 DIAGNOSIS — J3089 Other allergic rhinitis: Secondary | ICD-10-CM

## 2020-04-11 DIAGNOSIS — J4540 Moderate persistent asthma, uncomplicated: Secondary | ICD-10-CM

## 2020-04-11 DIAGNOSIS — K219 Gastro-esophageal reflux disease without esophagitis: Secondary | ICD-10-CM | POA: Diagnosis not present

## 2020-04-11 DIAGNOSIS — J4530 Mild persistent asthma, uncomplicated: Secondary | ICD-10-CM | POA: Diagnosis not present

## 2020-04-11 MED ORDER — FAMOTIDINE 40 MG PO TABS: 40.0000 mg | ORAL_TABLET | Freq: Every day | ORAL | 5 refills | Status: DC

## 2020-04-11 MED ORDER — PANTOPRAZOLE SODIUM 40 MG PO TBEC
40.0000 mg | DELAYED_RELEASE_TABLET | Freq: Two times a day (BID) | ORAL | 5 refills | Status: DC
Start: 2020-04-11 — End: 2020-06-20

## 2020-04-11 NOTE — Progress Notes (Signed)
[address] [place] - [place] - [place]   Dear [name],  Thank you for referring [name] to the [place] [facility] [place] on 04/11/2020.   Below is a summation of this patient's evaluation and recommendations.  Thank you for your referral. I will keep you informed about this patient's response to treatment.   If you have any questions please do not hesitate to contact me.   Sincerely,  Jiles Prows, MDOTC antihistamine -  Allergy / Immunology [place] [facility]   ______________________________________________________________________    NEW PATIENT NOTE  Referring Provider: Magdalen Spatz, NP Primary Provider: Marton Redwood, MD Date of office visit: 04/11/2020    Subjective:   Chief Complaint:  Rachel [name] (DOB: Mar 22, 1951) is a 69 y.o. female who presents to the clinic on 04/11/2020 with a chief complaint of Allergic Rhinitis  .     HPI: Rachel [name] presents to this clinic in evaluation of cough.  She has a long history of cough that has become much more significant over the course of the past 4 months since she moved into a new household in which there is located a cat and dog.  She has been treated for cough variant asthma with various controller agents in the past and these appear to help her cough to some degree but once again has she has lost control of this cough in the past 4 months even in the face of utilizing her medications for asthma.  She rarely uses any short acting bronchodilators and she does not have a tremendous amount of shortness of breath or wheezing.  She also has lots of throat clearing and postnasal drip and intermittent raspy voice which has been a longstanding issue and has been somewhat worse over the course of the past 4 months.  She does have reflux disease that she believes is under very good control on her current therapy for reflux.  She does not  consume caffeine but she does consume chocolate daily.  She also has sneezing and nasal congestion and blows her nose all the time even in the face of utilizing Flonase and Singulair.  The symptoms occur on a perennial basis without any obvious trigger.  She has had 20 years of anosmia.  Recently she was admitted to the hospital for congestive heart failure which presented mostly with shortness of breath.  She has been treated with diuresis and started on Entresto.  She is not sure that her cough is any worse since she started her Entresto.  She has received 2 Covid vaccinations.  Past Medical History:  Diagnosis Date  . Allergic rhinitis   . Anemia   . Arthritis   . Asthma   . Blood clot in vein  age 20   left leg  . Depression   . GERD (gastroesophageal reflux disease)   . Hyperlipidemia   . Hypertension    pt denies, in physician record  . Hypothyroidism   . Kidney stones   . Multiple pregnancy loss, not currently pregnant    multiple miscarriages-pt.unsure of #  . 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 Nephrolithiasis    hx of kidney stones, last 6 months ago  . Osteopenia   . Osteoporosis   . Parkinsonism ([place])   . Tendinitis    left foot  . Vitamin D deficiency     Past Surgical History:  Procedure Laterality Date  . BREAST EXCISIONAL  BIOPSY Right   . BREAST SURGERY     rt. lumpectomy, benign  . colonscopy  2011  . cystoscopy  2012  . JOINT REPLACEMENT  05-2007   right knee replacment/revision  . KIDNEY STONE SURGERY    . LEFT HEART CATH AND CORONARY ANGIOGRAPHY N/A 02/21/2020   Procedure: LEFT HEART CATH AND CORONARY ANGIOGRAPHY;  Surgeon: Adrian Prows, MD;  Location: [place] [facility];  Service: Cardiovascular;  Laterality: N/A;  . REPLACEMENT TOTAL KNEE Left 06/05/12  . TOTAL KNEE REVISION  06/03/2012   Procedure: TOTAL KNEE REVISION;  Surgeon: Gearlean Alf, MD;  Location: [facility];  Service: Orthopedics;  Laterality: Left;  Revision of a Left Uni Knee to a  Total Knee Arthroplasty    Allergies as of 04/11/2020      Reactions   Fosamax [alendronate] Other (See Comments)   headache   Symbicort [budesonide-formoterol Fumarate] Other (See Comments)   Shakes and muscle weakness      Medication List      albuterol (2.5 MG/3ML) 0.083% nebulizer solution Commonly known as: PROVENTIL Take 3 mLs (2.5 mg total) by nebulization every 6 (six) hours as needed for wheezing or shortness of breath.   amantadine 100 MG capsule Commonly known as: SYMMETREL TAKE 1 CAPSULE BY MOUTH THREE TIMES DAILY   Carbidopa-Levodopa ER 25-100 MG tablet controlled release Commonly known as: SINEMET CR TAKE 1 TABLET BY MOUTH SIX TIMES DAILY   citalopram 20 MG tablet Commonly known as: CELEXA Take 20 mg by mouth daily.   Dulera 100-5 MCG/ACT Aero Generic drug: mometasone-formoterol Inhale 2 puffs into the lungs in the morning and at bedtime.   fluticasone 50 MCG/ACT nasal spray Commonly known as: FLONASE Place 1 spray into both nostrils daily.   levothyroxine 112 MCG tablet Commonly known as: SYNTHROID Take 112 mcg by mouth daily.   Linzess 290 MCG Caps capsule Generic drug: linaclotide Take 290 mcg by mouth daily.   metoprolol succinate 50 MG 24 hr tablet Commonly known as: TOPROL-XL Take 1 tablet (50 mg total) by mouth daily. Take with or immediately following a meal.   montelukast 10 MG tablet Commonly known as: SINGULAIR Take 1 tablet (10 mg total) by mouth at bedtime.   multivitamin with minerals Tabs tablet Take 2 tablets by mouth daily.   NON FORMULARY Take by mouth. HEMP OIL CBD   ondansetron 4 MG disintegrating tablet Commonly known as: Zofran ODT Take 1 tablet (4 mg total) by mouth every 8 (eight) hours as needed for nausea or vomiting.   pantoprazole 40 MG tablet Commonly known as: PROTONIX Take 40 mg by mouth at bedtime.   pravastatin 40 MG tablet Commonly known as: PRAVACHOL Take 1 tablet (40 mg total) by mouth daily at 6  PM.   sacubitril-valsartan 24-26 MG Commonly known as: ENTRESTO Take 2 tablets by mouth 2 (two) times daily.   spironolactone 25 MG tablet Commonly known as: ALDACTONE Take 0.5 tablets (12.5 mg total) by mouth in the morning.   VITAMIN D PO Take 2 drops by mouth daily.   vitamin E 45 MG (100 UNITS) capsule Take 200 Units by mouth daily.   Voltaren 1 % Gel Generic drug: diclofenac Sodium Apply 1 application topically 4 (four) times daily as needed (pain).       Review of systems negative except as noted in HPI / PMHx or noted below:  Review of Systems  Constitutional: Negative.   HENT: Negative.   Eyes: Negative.   Respiratory: Negative.  Cardiovascular: Negative.   Gastrointestinal: Negative.   Genitourinary: Negative.   Musculoskeletal: Negative.   Skin: Negative.   Neurological: Negative.   Endo/Heme/Allergies: Negative.   Psychiatric/Behavioral: Negative.     Family History  Problem Relation Age of Onset  . Diabetes Mother   . Heart failure Mother        CHF  . Thyroid disease Mother   . Heart failure Father        CHF  . Heart attack Brother   . Breast cancer Sister   . Asthma Sister   . Heart failure Sister     Social History   Socioeconomic History  . Marital status: Married    Spouse name: Not on file  . Number of children: 1  . Years of education: Not on file  . Highest education level: Some college, no degree  Occupational History  . Occupation: retired/disabled    Employer: [employer]    Comment: receptionist  Tobacco Use  . Smoking status: Never Smoker  . Smokeless tobacco: Never Used  Vaping Use  . Vaping Use: Never used  Substance and Sexual Activity  . Alcohol use: No    Alcohol/week: 0.0 standard drinks  . Drug use: No  . Sexual activity: Never    Partners: Male    Birth control/protection: Post-menopausal  Other Topics Concern  . Not on file  Social History Narrative   Pt lives with spouse [name] at home, 1 adopted  son    Environmental and Social history  Lives in a house with a dry environment, cat and dog look inside the household, carpet in the bedroom, no plastic on the bed, no plastic on the pillow, no smoking ongoing with inside the household.  Objective:   Vitals:   04/11/20 1413  BP: 98/62  Pulse: 66  Resp: 16  Temp: 98.1 F (36.7 C)  SpO2: 97%   Height: 5\' 5"  (165.1 cm) Weight: 176 lb (79.8 kg)  Physical Exam Constitutional:      Appearance: She is not diaphoretic.  HENT:     Head: Normocephalic. No right periorbital erythema or left periorbital erythema.     Right Ear: Tympanic membrane, ear canal and external ear normal.     Left Ear: Tympanic membrane, ear canal and external ear normal.     Nose: Nose normal. No mucosal edema or rhinorrhea.     Mouth/Throat:     Pharynx: No oropharyngeal exudate.  Eyes:     General: Lids are normal.     Conjunctiva/sclera: Conjunctivae normal.     Pupils: Pupils are equal, round, and reactive to light.  Neck:     Thyroid: No thyromegaly.     Trachea: Trachea normal. No tracheal deviation.  Cardiovascular:     Rate and Rhythm: Normal rate and regular rhythm.     Heart sounds: Normal heart sounds, S1 normal and S2 normal. No murmur heard.   Pulmonary:     Effort: Pulmonary effort is normal. No respiratory distress.     Breath sounds: No stridor. No wheezing or rales.  Chest:     Chest wall: No tenderness.  Abdominal:     General: There is no distension.     Palpations: Abdomen is soft. There is no mass.     Tenderness: There is no abdominal tenderness. There is no guarding or rebound.  Musculoskeletal:        General: No tenderness.     Right lower leg: Edema present.  Left lower leg: Edema present.  Lymphadenopathy:     Head:     Right side of head: No tonsillar adenopathy.     Left side of head: No tonsillar adenopathy.     Cervical: No cervical adenopathy.  Skin:    Coloration: Skin is not pale.     Findings: No  erythema or rash.     Nails: There is no clubbing.  Neurological:     Mental Status: She is alert.     Comments: Resting and intention tremor     Diagnostics: Allergy skin tests were performed.  She did not demonstrate any hypersensitivity against a screening panel of aeroallergens or foods.  Spirometry was performed and demonstrated an FEV1 of 1.42 @ 60 % of predicted. FEV1/FVC = 0.70  Results of pulmonary function testing obtained 20 August 2019 identified RV 141% predicted, TLC 109% of predicted but, RV/TLC 126% predicted, DL/VA 132% of predicted.  Results of a chest CT angio obtained 17 February 2020 identified the following:  Cardiovascular: This is a technically adequate evaluation of the pulmonary vasculature. No filling defects or pulmonary emboli.  The heart is unremarkable without pericardial effusion. Normal caliber of the thoracic aorta. Mild atherosclerosis.  Mediastinum/Nodes: No enlarged mediastinal, hilar, or axillary lymph nodes. Thyroid gland, trachea, and esophagus demonstrate no significant findings.  Lungs/Pleura: Hypoventilatory changes are seen within the bilateral lower lobes. Mild background emphysema. No acute airspace disease, effusion, or pneumothorax. Central airways are patent.  Results of an ECHO obtained 17 February 2020 identified the following:  1. Left ventricular ejection fraction, by estimation, is 30 to 35%. The  left ventricle has moderately decreased function. The left ventricle  demonstrates global hypokinesis. Abnormal septal motion consistent with  left bundle branch block.The left  ventricular internal cavity size was mildly dilated. There is mild left  ventricular hypertrophy. Left ventricular diastolic parameters are  indeterminate.  2. Right ventricular systolic function is normal. The right ventricular  size is normal. Tricuspid regurgitation signal is inadequate for assessing  PA pressure.  3. The mitral valve is normal in  structure. Mild mitral valve  regurgitation.  4. The aortic valve has an indeterminant number of cusps. Aortic valve  regurgitation is not visualized. Mild aortic valve sclerosis is present,  with no evidence of aortic valve stenosis.  5. The inferior vena cava is dilated in size with <50% respiratory  variability, suggesting right atrial pressure of 15 mmHg.   Assessment and Plan:    1. Not well controlled moderate persistent asthma   2. Other allergic rhinitis   3. LPRD (laryngopharyngeal reflux disease)     1.  Allergen avoidance measures  2.  Treat and prevent inflammation:   A.  Continue Dulera   B.  Continue Flonase  C.  Continue montelukast/Singulair  3.  Treat and prevent reflux:   A.  Taper off all forms of caffeine including chocolate  B.  Increase pantoprazole to 40 mg twice a day  C.  Start famotidine 40 mg in evening  4.  If needed:   A. Albuterol HFA - 2 inhalations every 4-6 hours  B. OTC antihistamine - Claritin / Zyrtec 10 mg - 1 tablet 1 time per day  5. Entresto? (ACE inhibitor)  6. Blood - Area 2 aeroallergen profile, CBC w/diff  7. Obtain fall flu vaccine and Covid vaccine  8. Return to clinic in 4 weeks or earlier if problem  Alannah has an irritated airway and this may be secondary to a  combination of various insults including an atopic inflammatory condition of her airway and reflux induced respiratory disease.  In addition, she was recently started on a ACE inhibitor but certainly her cough predated the use of this medication.  We will evaluate her further for atopic disease and eosinophilia with the blood test noted above and I will aggressively treat her reflux while she remains on anti-inflammatory agents for her airway.  I will see her back in his clinic in 4 weeks to consider further evaluation and treatment based upon her response to this approach.  Jiles Prows, MD Allergy / Immunology [facility]

## 2020-04-11 NOTE — Patient Instructions (Addendum)
  1.  Allergen avoidance measures  2.  Treat and prevent inflammation:   A.  Continue Dulera   B.  Continue Flonase  C.  Continue montelukast/Singulair  3.  Treat and prevent reflux:   A.  Taper off all forms of caffeine including chocolate  B.  Increase pantoprazole to 40 mg twice a day  C.  Start famotidine 40 mg in evening  4.  If needed:   A. Albuterol HFA - 2 inhalations every 4-6 hours  B. OTC antihistamine - Claritin / Zyrtec 10 mg - 1 tablet 1 time per day  5. Entresto? (ACE inhibitor)  6. Blood - Area 2 aeroallergen profile, CBC w/diff  7. Obtain fall flu vaccine and Covid vaccine  8. Return to clinic in 4 weeks or earlier if problem

## 2020-04-12 ENCOUNTER — Encounter: Payer: Self-pay | Admitting: Allergy and Immunology

## 2020-04-14 LAB — CBC WITH DIFFERENTIAL
Basophils Absolute: 0 10*3/uL (ref 0.0–0.2)
Basos: 1 %
EOS (ABSOLUTE): 0.2 10*3/uL (ref 0.0–0.4)
Eos: 3 %
Hematocrit: 37.3 % (ref 34.0–46.6)
Hemoglobin: 12.4 g/dL (ref 11.1–15.9)
Immature Grans (Abs): 0 10*3/uL (ref 0.0–0.1)
Immature Granulocytes: 0 %
Lymphocytes Absolute: 1.2 10*3/uL (ref 0.7–3.1)
Lymphs: 24 %
MCH: 30.1 pg (ref 26.6–33.0)
MCHC: 33.2 g/dL (ref 31.5–35.7)
MCV: 91 fL (ref 79–97)
Monocytes Absolute: 0.5 10*3/uL (ref 0.1–0.9)
Monocytes: 10 %
Neutrophils Absolute: 3.2 10*3/uL (ref 1.4–7.0)
Neutrophils: 62 %
RBC: 4.12 x10E6/uL (ref 3.77–5.28)
RDW: 13.6 % (ref 11.7–15.4)
WBC: 5.1 10*3/uL (ref 3.4–10.8)

## 2020-04-14 LAB — ALLERGENS W/TOTAL IGE AREA 2

## 2020-04-17 DIAGNOSIS — I428 Other cardiomyopathies: Secondary | ICD-10-CM | POA: Diagnosis not present

## 2020-04-17 DIAGNOSIS — I5042 Chronic combined systolic (congestive) and diastolic (congestive) heart failure: Secondary | ICD-10-CM | POA: Diagnosis not present

## 2020-04-18 ENCOUNTER — Other Ambulatory Visit: Payer: Self-pay | Admitting: Cardiology

## 2020-04-18 DIAGNOSIS — I5042 Chronic combined systolic (congestive) and diastolic (congestive) heart failure: Secondary | ICD-10-CM

## 2020-04-18 LAB — BASIC METABOLIC PANEL
BUN/Creatinine Ratio: 17 (ref 12–28)
BUN: 23 mg/dL (ref 8–27)
CO2: 27 mmol/L (ref 20–29)
Calcium: 9.8 mg/dL (ref 8.7–10.3)
Chloride: 99 mmol/L (ref 96–106)
Creatinine, Ser: 1.39 mg/dL — ABNORMAL HIGH (ref 0.57–1.00)
GFR calc Af Amer: 44 mL/min/{1.73_m2} — ABNORMAL LOW (ref >59)
GFR calc non Af Amer: 38 mL/min/{1.73_m2} — ABNORMAL LOW (ref >59)
Glucose: 92 mg/dL (ref 65–99)
Potassium: 4.3 mmol/L (ref 3.5–5.2)
Sodium: 139 mmol/L (ref 134–144)

## 2020-04-18 LAB — PRO B NATRIURETIC PEPTIDE: NT-Pro BNP: 2912 pg/mL — ABNORMAL HIGH (ref 0–301)

## 2020-04-18 LAB — MAGNESIUM: Magnesium: 1.9 mg/dL (ref 1.6–2.3)

## 2020-04-18 MED ORDER — SPIRONOLACTONE 25 MG PO TABS: 25.0000 mg | ORAL_TABLET | Freq: Every morning | ORAL | 0 refills | Status: DC

## 2020-04-18 MED ORDER — ENTRESTO 24-26 MG PO TABS: 1.0000 | ORAL_TABLET | Freq: Two times a day (BID) | ORAL | 0 refills | Status: DC

## 2020-04-18 NOTE — Progress Notes (Signed)
Last office visit increased Entresto from 24/26mg  po bid to [facility] 24/26mg  two tabs bid.  Repeat labwork notes uptrended Cr.  Patient asked to decrease her Entresto to 24/26mg  po bid and increase Aldactone to 25mg  po qday.  Repeat labs in one week. Increase fluid intake by 2 glasses per day as well.    ICD-10-CM   1. Chronic combined systolic and diastolic heart failure (HCC)  G89.16 Basic metabolic panel    Magnesium    spironolactone (ALDACTONE) 25 MG tablet    sacubitril-valsartan (ENTRESTO) 24-26 MG   ST

## 2020-04-19 ENCOUNTER — Other Ambulatory Visit: Payer: Self-pay

## 2020-04-19 DIAGNOSIS — I5042 Chronic combined systolic (congestive) and diastolic (congestive) heart failure: Secondary | ICD-10-CM

## 2020-04-19 MED ORDER — SPIRONOLACTONE 25 MG PO TABS: 25.0000 mg | ORAL_TABLET | Freq: Every morning | ORAL | 0 refills | Status: DC

## 2020-04-19 MED ORDER — ENTRESTO 24-26 MG PO TABS: 1.0000 | ORAL_TABLET | Freq: Two times a day (BID) | ORAL | 0 refills | Status: AC

## 2020-04-21 ENCOUNTER — Other Ambulatory Visit: Payer: Self-pay | Admitting: Cardiology

## 2020-04-21 ENCOUNTER — Other Ambulatory Visit: Payer: Self-pay | Admitting: Neurology

## 2020-04-21 DIAGNOSIS — I5042 Chronic combined systolic (congestive) and diastolic (congestive) heart failure: Secondary | ICD-10-CM

## 2020-04-27 DIAGNOSIS — I5042 Chronic combined systolic (congestive) and diastolic (congestive) heart failure: Secondary | ICD-10-CM | POA: Diagnosis not present

## 2020-04-28 LAB — MAGNESIUM: Magnesium: 2 mg/dL (ref 1.6–2.3)

## 2020-04-28 LAB — BASIC METABOLIC PANEL
BUN/Creatinine Ratio: 12 (ref 12–28)
BUN: 20 mg/dL (ref 8–27)
CO2: 25 mmol/L (ref 20–29)
Calcium: 9.8 mg/dL (ref 8.7–10.3)
Chloride: 99 mmol/L (ref 96–106)
Creatinine, Ser: 1.65 mg/dL — ABNORMAL HIGH (ref 0.57–1.00)
GFR calc Af Amer: 36 mL/min/{1.73_m2} — ABNORMAL LOW (ref >59)
GFR calc non Af Amer: 31 mL/min/{1.73_m2} — ABNORMAL LOW (ref >59)
Glucose: 93 mg/dL (ref 65–99)
Potassium: 4.3 mmol/L (ref 3.5–5.2)
Sodium: 137 mmol/L (ref 134–144)

## 2020-05-01 ENCOUNTER — Other Ambulatory Visit: Payer: Self-pay | Admitting: Cardiology

## 2020-05-01 DIAGNOSIS — I5042 Chronic combined systolic (congestive) and diastolic (congestive) heart failure: Secondary | ICD-10-CM

## 2020-05-03 ENCOUNTER — Other Ambulatory Visit: Payer: Self-pay | Admitting: Cardiology

## 2020-05-03 NOTE — Progress Notes (Signed)
Going to contact pt, just would like verification on "labs 8/19" Is this an error regarding when you would like pt to have labs drawn?

## 2020-05-03 NOTE — Progress Notes (Signed)
On 05/16/2020 or after.

## 2020-05-04 NOTE — Progress Notes (Signed)
Unable to reach patient. Left vm for patient to cb. Will try to contact again at later date.

## 2020-05-05 NOTE — Progress Notes (Signed)
2nd attempt : Called patient, NA, LMAM

## 2020-05-06 DIAGNOSIS — M25552 Pain in left hip: Secondary | ICD-10-CM | POA: Diagnosis not present

## 2020-05-06 DIAGNOSIS — R2689 Other abnormalities of gait and mobility: Secondary | ICD-10-CM | POA: Diagnosis not present

## 2020-05-06 DIAGNOSIS — M545 Low back pain, unspecified: Secondary | ICD-10-CM | POA: Diagnosis not present

## 2020-05-06 DIAGNOSIS — M533 Sacrococcygeal disorders, not elsewhere classified: Secondary | ICD-10-CM | POA: Diagnosis not present

## 2020-05-06 DIAGNOSIS — R102 Pelvic and perineal pain: Secondary | ICD-10-CM | POA: Diagnosis not present

## 2020-05-06 DIAGNOSIS — S3993XA Unspecified injury of pelvis, initial encounter: Secondary | ICD-10-CM | POA: Diagnosis not present

## 2020-05-06 DIAGNOSIS — S3992XA Unspecified injury of lower back, initial encounter: Secondary | ICD-10-CM | POA: Diagnosis not present

## 2020-05-07 DIAGNOSIS — M25552 Pain in left hip: Secondary | ICD-10-CM | POA: Diagnosis not present

## 2020-05-08 NOTE — Progress Notes (Signed)
3rd attempt : Called patient, NA, LMAM

## 2020-05-23 ENCOUNTER — Encounter: Payer: Self-pay | Admitting: Allergy and Immunology

## 2020-05-23 ENCOUNTER — Ambulatory Visit (INDEPENDENT_AMBULATORY_CARE_PROVIDER_SITE_OTHER): Payer: Medicare Other | Admitting: Allergy and Immunology

## 2020-05-23 ENCOUNTER — Other Ambulatory Visit: Payer: Self-pay

## 2020-05-23 VITALS — BP 118/78 | HR 56 | Temp 98.0°F | Resp 16

## 2020-05-23 DIAGNOSIS — K219 Gastro-esophageal reflux disease without esophagitis: Secondary | ICD-10-CM

## 2020-05-23 DIAGNOSIS — J3089 Other allergic rhinitis: Secondary | ICD-10-CM

## 2020-05-23 DIAGNOSIS — J4540 Moderate persistent asthma, uncomplicated: Secondary | ICD-10-CM | POA: Diagnosis not present

## 2020-05-23 DIAGNOSIS — Z79899 Other long term (current) drug therapy: Secondary | ICD-10-CM

## 2020-05-23 MED ORDER — IPRATROPIUM BROMIDE 0.06 % NA SOLN: NASAL | 5 refills | Status: DC

## 2020-05-23 NOTE — Progress Notes (Signed)
[place]    Follow-up Note  Referring Provider: Marton Redwood, MD Primary Provider: Marton Redwood, MD Date of Office Visit: 05/23/2020  Subjective:   [name] (DOB: October 25, 1950) is a 69 y.o. female who returns to the [facility] on 05/23/2020 in re-evaluation of the following:  HPI: Adreana returns to this clinic in evaluation of cough addressed during her initial evaluation of 11 April 2020.  Sherry's history of cough, believed secondary to a combination of respiratory tract inflammation and LPR and possible neprilysin inhibitor induced cough, appears to still be an active issue.  She might have had a week or 2 without much cough after seeing me in this clinic and initiating therapy but basically she continues to cough.  She blames the administration of her Covid booster vaccine on 27 April 2020 for precipitating her cough.  However, it should be noted that she has been coughing now for close to 6 months.  As well, she has a chronic runny nose and she is blowing her nose all the time.  She does not have any anosmia or ugly nasal discharge.  She still has lots of throat clearing, postnasal drip, and has some intermittent raspy voice.  She has no classic reflux symptoms.  Allergies as of 05/23/2020      Reactions   Fosamax [alendronate] Other (See Comments)   headache   Symbicort [budesonide-formoterol Fumarate] Other (See Comments)   Shakes and muscle weakness      Medication List      albuterol (2.5 MG/3ML) 0.083% nebulizer solution Commonly known as: PROVENTIL Take 3 mLs (2.5 mg total) by nebulization every 6 (six) hours as needed for wheezing or shortness of breath.   amantadine 100 MG capsule Commonly known as: SYMMETREL TAKE 1 CAPSULE BY MOUTH THREE TIMES DAILY   Carbidopa-Levodopa ER 25-100 MG tablet controlled release Commonly known as: SINEMET CR TAKE 1 TABLET BY MOUTH SIX TIMES DAILY    citalopram 20 MG tablet Commonly known as: CELEXA Take 20 mg by mouth daily.   Dulera 100-5 MCG/ACT Aero Generic drug: mometasone-formoterol Inhale 2 puffs into the lungs in the morning and at bedtime.   Entresto 24-26 MG Generic drug: sacubitril-valsartan Take 1 tablet by mouth 2 (two) times daily.   famotidine 40 MG tablet Commonly known as: PEPCID Take 1 tablet (40 mg total) by mouth daily.   fluticasone 50 MCG/ACT nasal spray Commonly known as: FLONASE Place 1 spray into both nostrils daily.   ipratropium 0.06 % nasal spray Commonly known as: ATROVENT Use 2 sprays in each nostril every 6 hours if needed to dry up nose. Started by: Jiles Prows, MD   levothyroxine 112 MCG tablet Commonly known as: SYNTHROID Take 112 mcg by mouth daily.   Linzess 290 MCG Caps capsule Generic drug: linaclotide Take 290 mcg by mouth daily.   metoprolol succinate 50 MG 24 hr tablet Commonly known as: TOPROL-XL Take 1 tablet (50 mg total) by mouth daily. Take with or immediately following a meal.   montelukast 10 MG tablet Commonly known as: SINGULAIR Take 1 tablet (10 mg total) by mouth at bedtime.   multivitamin with minerals Tabs tablet Take 2 tablets by mouth daily.   NON FORMULARY Take by mouth. HEMP OIL CBD   ondansetron 4 MG disintegrating tablet Commonly known as: Zofran ODT Take 1 tablet (4 mg total) by mouth every 8 (eight) hours as needed for nausea or vomiting.  pantoprazole 40 MG tablet Commonly known as: PROTONIX Take 1 tablet (40 mg total) by mouth 2 (two) times daily.   pravastatin 40 MG tablet Commonly known as: PRAVACHOL Take 1 tablet (40 mg total) by mouth daily at 6 PM.   spironolactone 25 MG tablet Commonly known as: ALDACTONE Take 1 tablet (25 mg total) by mouth in the morning.   VITAMIN D PO Take 2 drops by mouth daily.   vitamin E 45 MG (100 UNITS) capsule Take 200 Units by mouth daily.   Voltaren 1 % Gel Generic drug: diclofenac  Sodium Apply 1 application topically 4 (four) times daily as needed (pain).       Past Medical History:  Diagnosis Date  . Allergic rhinitis   . Anemia   . Arthritis   . Asthma   . Blood clot in vein  age 24   left leg  . Depression   . GERD (gastroesophageal reflux disease)   . Hyperlipidemia   . Hypertension    pt denies, in physician record  . Hypothyroidism   . Kidney stones   . Multiple pregnancy loss, not currently pregnant    multiple miscarriages-pt.unsure of #  . 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 Nephrolithiasis    hx of kidney stones, last 6 months ago  . Osteopenia   . Osteoporosis   . Parkinsonism ([place])   . Tendinitis    left foot  . Vitamin D deficiency     Past Surgical History:  Procedure Laterality Date  . BREAST EXCISIONAL BIOPSY Right   . BREAST SURGERY     rt. lumpectomy, benign  . colonscopy  2011  . cystoscopy  2012  . JOINT REPLACEMENT  05-2007   right knee replacment/revision  . KIDNEY STONE SURGERY    . LEFT HEART CATH AND CORONARY ANGIOGRAPHY N/A 02/21/2020   Procedure: LEFT HEART CATH AND CORONARY ANGIOGRAPHY;  Surgeon: Adrian Prows, MD;  Location: [place] [facility];  Service: Cardiovascular;  Laterality: N/A;  . REPLACEMENT TOTAL KNEE Left 06/05/12  . TOTAL KNEE REVISION  06/03/2012   Procedure: TOTAL KNEE REVISION;  Surgeon: Gearlean Alf, MD;  [facility];  Service: Orthopedics;  Laterality: Left;  Revision of a Left Uni Knee to a Total Knee Arthroplasty    Review of systems negative except as noted in HPI / PMHx or noted below:  Review of Systems  Constitutional: Negative.   HENT: Negative.   Eyes: Negative.   Respiratory: Negative.   Cardiovascular: Negative.   Gastrointestinal: Negative.   Genitourinary: Negative.   Musculoskeletal: Negative.   Skin: Negative.   Neurological: Negative.   Endo/Heme/Allergies: Negative.   Psychiatric/Behavioral: Negative.      Objective:   Vitals:   05/23/20 1547  BP: 118/78   Pulse: (!) 56  Resp: 16  Temp: 98 F (36.7 C)  SpO2: 97%          Physical Exam Constitutional:      Appearance: She is not diaphoretic.  HENT:     Head: Normocephalic.     Right Ear: Tympanic membrane, ear canal and external ear normal.     Left Ear: Tympanic membrane, ear canal and external ear normal.     Nose: Nose normal. No mucosal edema or rhinorrhea.     Mouth/Throat:     Pharynx: Uvula midline. No oropharyngeal exudate.  Eyes:     Conjunctiva/sclera: Conjunctivae normal.  Neck:     Thyroid: No thyromegaly.     Trachea:  Trachea normal. No tracheal tenderness or tracheal deviation.  Cardiovascular:     Rate and Rhythm: Normal rate and regular rhythm.     Heart sounds: Normal heart sounds, S1 normal and S2 normal. No murmur heard.   Pulmonary:     Effort: No respiratory distress.     Breath sounds: Normal breath sounds. No stridor. No wheezing or rales.  Lymphadenopathy:     Head:     Right side of head: No tonsillar adenopathy.     Left side of head: No tonsillar adenopathy.     Cervical: No cervical adenopathy.  Skin:    Findings: No erythema or rash.     Nails: There is no clubbing.  Neurological:     Mental Status: She is alert.     Diagnostics:    Spirometry was performed and demonstrated an FEV1 of 1.48 at 63 % of predicted.  Assessment and Plan:   1. Not well controlled moderate persistent asthma   2. Other allergic rhinitis   3. LPRD (laryngopharyngeal reflux disease)     1.  Treat and prevent inflammation:   A.  Continue Dulera   B.  Continue Flonase  C.  Continue montelukast/Singulair  3.  Treat and prevent reflux:   A.  Taper off all forms of caffeine including chocolate  B.  pantoprazole to 40 mg twice a day  C.  famotidine 40 mg in evening  4.  If needed:   A. Albuterol HFA - 2 inhalations every 4-6 hours  B. OTC antihistamine - Claritin / Zyrtec 10 mg - 1 tablet 1 time per day  C. Ipratropium 0.06% - 2 sprays each nostril  every 6 hours to dry nose  5. 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 Evaluation of throat with ENT for LPR  7. Return to clinic in 8 weeks or earlier if problem  Adaleigh continues to have pretty bothersome respiratory tract symptoms in the form of cough while aggressively being treated with anti-inflammatory agents for her airway and aggressive therapy directed against reflux.  I think we need to consider the possibility that the neprilysin inhibitor component of Delene Loll is contributing to her cough and she needs to have a discussion with her cardiologist about that issue.  Entresto increase his levels of bradykinin which can lead to cough and in clinical studies 9% of patients who use Entresto cough.  I think it would be worthwhile to have evaluation of her throat with ENT to see if there is some type of mid airway issue contributing to her cough.  I will see her back in his clinic in 8 weeks or earlier if there is a problem.  Allena Katz, MD Allergy / Immunology [place]

## 2020-05-23 NOTE — Patient Instructions (Addendum)
  1.  Treat and prevent inflammation:   A.  Continue Dulera   B.  Continue Flonase  C.  Continue montelukast/Singulair  3.  Treat and prevent reflux:   A.  Taper off all forms of caffeine including chocolate  B.  pantoprazole to 40 mg twice a day  C.  famotidine 40 mg in evening  4.  If needed:   A. Albuterol HFA - 2 inhalations every 4-6 hours  B. OTC antihistamine - Claritin / Zyrtec 10 mg - 1 tablet 1 time per day  C. Ipratropium 0.06% - 2 sprays each nostril every 6 hours to dry nose  5. 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 Evaluation of throat with ENT for LPR  7. Return to clinic in 8 weeks or earlier if problem

## 2020-05-23 NOTE — Progress Notes (Signed)
Rachel [name] and her husband would like to hold off on the ENT referral for now. They will call us if they want [place] to schedule an appointment for her.

## 2020-05-24 ENCOUNTER — Encounter: Payer: Self-pay | Admitting: Allergy and Immunology

## 2020-05-24 ENCOUNTER — Telehealth: Payer: Self-pay | Admitting: Neurology

## 2020-05-24 DIAGNOSIS — I5042 Chronic combined systolic (congestive) and diastolic (congestive) heart failure: Secondary | ICD-10-CM | POA: Diagnosis not present

## 2020-05-24 NOTE — Telephone Encounter (Signed)
Patient and husband called to request urgent appointment with Dr Tat. Patient states she got her covid shot and since she has had a lot of health issues occurring. She wants to know what is MSA related or not. She is losing her balance, falling, was recently in hospital from a fall, she has a lot of shaking, she is getting "eye migraines" every day. She has an eye appointment scheduled soon. She states she was recently diagnosed with congestive heart failure and was put on medication. She's seen an allergist who told her that he didn't want her to take the heart medication. They are seeking Dr Doristine Devoid opinion and guidance. She is also requesting a prescription for a wheelchair. Patient requests to call husbands number instead of hers - [phone]. Scheduled patient for next availability - 08/17/20 and placed on waitlist. Please call.

## 2020-05-24 NOTE — Progress Notes (Signed)
Assessment/Plan:   1.  MSA  -Patient understands differences between [place] and Parkinson's disease.  Understands that there is an increased risk of falls, aspiration as subsequent morbidity and mortality from this disease compared to Parkinson's disease.  -Take carbidopa/levodopa 25/100 CR, 2 tablets in the AM, an increase, and then 5 other dosages in the day  -Continue amantadine, 100 mg 3 times per day  -do home PT  - will send to the home  -they would like WC for the home.    Pt would benefit for in home WC as has difficulty getting to bathroom on time with walking alone.  Husband has the power to push it (she likely cannot propel on own).  She does have mental capability to use it.  Multiple falls already with traditional walker and rollator.    2.  Memory change  -Previously scheduled for neurocognitive testing in April, 2021 but canceled.  Can let us know if she changes her mind. 3.  Diplopia  -Saw Dr. Hassell Done years ago at [facility].  Diagnosis was congenital large intermittent exotropia/exophoria  -newer floaters and scintillating scotomas.  Pt to make f/u with Dr. Ellie Lunch. 4.  Dysphagia  -Last MBE in April, 2021.  Recommendations were regular diet, thin liquid 5.  Anemia  -Patient was anemic in the emergency room after her fall.  She does have a history of baseline anemia, but told her that she needed to follow-up with her primary care physician, just to make sure that this is not getting worse and is not the reason for her increasing weakness. 6.  Rhinorrhea  -could be vasomotor rhinorrhea.  She sees allergy/immunology as well.  Discussed atropine gtts but she just got atrovent and told her to try that first given the limitations with the atropine gtts (frequency of admin, trouble getting them in nose).  She agreed 7.  Nausea  -was gone and just started back.  Told her to take wait and see approach before restarted zofran   Subjective:   Rachel [name] was seen today in follow up  for MSA.  My previous records were reviewed prior to todays visit as well as outside records available to me.  Patient worked in today at her request.  Patient with husband who supplements the history.  Patient apparently got her Covid booster at the end of September and feels that she has not been doing as well since.  She felt that this caused her to go downhill overall. She is having more balance issues since the booster.  Was seen in the emergency room in [place] on October 10 after a fall backwards.  She was at a wedding.  She did not hit the head.  No CT brain was done.  She did have some x-rays of the pelvis that were negative.  I reviewed the records that were sent to me from the emergency room.  She apparently had a few falls before they went to [place] but didn't get hurt.  Since that time, she has not had falls but balance hasn't been good.  She is using her transport chair and her husband is being cautious and is helping her.   I do have records from cardiology from September 13.  She was told to increase her Entresto, although I do see that allergy/immunology questioned whether or not this was contributing to her cough.  Not using albuterol.  Having nose running.  Having floaters and sees scintillating scotomas in the periphery.    Current  prescribed movement disorder medications: Amantadine, 100 mg 3 times per day (increased) Carbidopa/levodopa 25/100 CR, 1 tablet 6 times per day (10am - wake up time will start these and generally takes 4-6 per day)   ALLERGIES:   Allergies  Allergen Reactions  . Fosamax [Alendronate] Other (See Comments)    headache  . Symbicort [Budesonide-Formoterol Fumarate] Other (See Comments)    Shakes and muscle weakness    CURRENT MEDICATIONS:  Outpatient Encounter Medications as of 05/25/2020  Medication Sig  . albuterol (PROVENTIL) (2.5 MG/3ML) 0.083% nebulizer solution Take 3 mLs (2.5 mg total) by nebulization every 6 (six) hours as needed for wheezing  or shortness of breath.  Marland Kitchen amantadine (SYMMETREL) 100 MG capsule TAKE 1 CAPSULE BY MOUTH THREE TIMES DAILY  . Carbidopa-Levodopa ER (SINEMET CR) 25-100 MG tablet controlled release TAKE 1 TABLET BY MOUTH SIX TIMES DAILY  . citalopram (CELEXA) 20 MG tablet Take 20 mg by mouth daily.  . DULERA 100-5 MCG/ACT AERO Inhale 2 puffs into the lungs in the morning and at bedtime.  . famotidine (PEPCID) 40 MG tablet Take 1 tablet (40 mg total) by mouth daily.  Marland Kitchen ipratropium (ATROVENT) 0.06 % nasal spray Use 2 sprays in each nostril every 6 hours if needed to dry up nose.  . levothyroxine (SYNTHROID) 112 MCG tablet Take 112 mcg by mouth daily.  Marland Kitchen LINZESS 290 MCG CAPS capsule Take 290 mcg by mouth daily.  . metoprolol succinate (TOPROL-XL) 50 MG 24 hr tablet Take 1 tablet (50 mg total) by mouth daily. Take with or immediately following a meal.  . montelukast (SINGULAIR) 10 MG tablet Take 1 tablet (10 mg total) by mouth at bedtime.  . Multiple Vitamin (MULTIVITAMIN WITH MINERALS) TABS tablet Take 2 tablets by mouth daily.   . NON FORMULARY Take by mouth. HEMP OIL CBD  . ondansetron (ZOFRAN ODT) 4 MG disintegrating tablet Take 1 tablet (4 mg total) by mouth every 8 (eight) hours as needed for nausea or vomiting.  . pantoprazole (PROTONIX) 40 MG tablet Take 1 tablet (40 mg total) by mouth 2 (two) times daily.  . pravastatin (PRAVACHOL) 40 MG tablet Take 1 tablet (40 mg total) by mouth daily at 6 PM.  . sacubitril-valsartan (ENTRESTO) 24-26 MG Take 1 tablet by mouth 2 (two) times daily.  . VOLTAREN 1 % GEL Apply 1 application topically 4 (four) times daily as needed (pain).   . vitamin E 100 UNIT capsule Take 200 Units by mouth daily.  (Patient not taking: Reported on 05/25/2020)  . [DISCONTINUED] fluticasone (FLONASE) 50 MCG/ACT nasal spray Place 1 spray into both nostrils daily. (Patient not taking: Reported on 05/25/2020)  . [DISCONTINUED] spironolactone (ALDACTONE) 25 MG tablet Take 1 tablet (25 mg total) by  mouth in the morning. (Patient not taking: Reported on 05/25/2020)  . [DISCONTINUED] VITAMIN D PO Take 2 drops by mouth daily. (Patient not taking: Reported on 05/25/2020)   Facility-Administered Encounter Medications as of 05/25/2020  Medication  . mometasone-formoterol (DULERA) 100-5 MCG/ACT inhaler 2 puff    Objective:   PHYSICAL EXAMINATION:    VITALS:   Vitals:   05/25/20 1007  BP: (!) 143/70  Pulse: 62  SpO2: 98%  Weight: 167 lb (75.8 kg)  Height: 5\' 6"  (1.676 m)    GEN:  The patient appears stated age and is in NAD. HEENT:  Normocephalic, atraumatic.  The mucous membranes are moist. The superficial temporal arteries are without ropiness or tenderness. CV:  RRR Lungs:  CTAB Neck/HEME:  There  are no carotid bruits bilaterally.  Neurological examination:  Orientation: The patient is alert and oriented x3. Cranial nerves: There is good facial symmetry with min facial hypomimia. The speech is fluent and clear. Soft palate rises symmetrically and there is no tongue deviation. Hearing is intact to conversational tone. Sensation: Sensation is intact to light touch throughout Motor: Strength is at least antigravity x4.   Movement examination: Tone: There is nl tone in the UE/LE Abnormal movements: there is L>RUE rest tremor and mild LLE rest tremor Coordination:  There is mild decremation with RAM's Gait and Station: pt pushes off and requires mild assist OOC.  She is ataxic and somewhat unstable.     I have reviewed and interpreted the following labs independently    Chemistry      Component Value Date/Time   NA 139 05/24/2020 1631   K 4.1 05/24/2020 1631   CL 101 05/24/2020 1631   CO2 26 05/24/2020 1631   BUN 22 05/24/2020 1631   CREATININE 1.37 (H) 05/24/2020 1631      Component Value Date/Time   CALCIUM 9.1 05/24/2020 1631   ALKPHOS 82 11/14/2019 1456   AST 21 11/14/2019 1456   ALT 14 11/14/2019 1456   BILITOT 0.9 11/14/2019 1456       Lab Results   Component Value Date   WBC 5.1 04/11/2020   HGB 12.4 04/11/2020   HCT 37.3 04/11/2020   MCV 91 04/11/2020   PLT 267 02/19/2020    Lab Results  Component Value Date   TSH 1.862 02/18/2020   Emergency room labs from [place] from October 10 also had labs.  Sodium was 137, potassium 3.5, chloride 99, CO2 31, BUN 9, creatinine 0.8, glucose 98, white blood cell 6.2, hemoglobin 11.2, hematocrit 32.2 and platelets 256  Total time spent on today's visit was 45 minutes, including both face-to-face time and nonface-to-face time.  Time included that spent on review of records (prior notes available to me/labs/imaging if pertinent), discussing treatment and goals, answering patient's questions and coordinating care.  Cc:  Marton Redwood, MD

## 2020-05-24 NOTE — Telephone Encounter (Signed)
You can work her in tomorrow.  Tell her may not be extended appt so to get here a little early.  I don't see any hospitalizations though for fall - where can we find those records?

## 2020-05-25 ENCOUNTER — Other Ambulatory Visit: Payer: Self-pay

## 2020-05-25 ENCOUNTER — Ambulatory Visit: Payer: Medicare Other | Admitting: Neurology

## 2020-05-25 ENCOUNTER — Encounter: Payer: Self-pay | Admitting: Neurology

## 2020-05-25 VITALS — BP 143/70 | HR 62 | Ht 66.0 in | Wt 167.0 lb

## 2020-05-25 DIAGNOSIS — G232 Striatonigral degeneration: Secondary | ICD-10-CM

## 2020-05-25 DIAGNOSIS — R110 Nausea: Secondary | ICD-10-CM | POA: Diagnosis not present

## 2020-05-25 DIAGNOSIS — J3489 Other specified disorders of nose and nasal sinuses: Secondary | ICD-10-CM

## 2020-05-25 LAB — BASIC METABOLIC PANEL
BUN/Creatinine Ratio: 16 (ref 12–28)
BUN: 22 mg/dL (ref 8–27)
CO2: 26 mmol/L (ref 20–29)
Calcium: 9.1 mg/dL (ref 8.7–10.3)
Chloride: 101 mmol/L (ref 96–106)
Creatinine, Ser: 1.37 mg/dL — ABNORMAL HIGH (ref 0.57–1.00)
GFR calc Af Amer: 45 mL/min/{1.73_m2} — ABNORMAL LOW (ref >59)
GFR calc non Af Amer: 39 mL/min/{1.73_m2} — ABNORMAL LOW (ref >59)
Glucose: 84 mg/dL (ref 65–99)
Potassium: 4.1 mmol/L (ref 3.5–5.2)
Sodium: 139 mmol/L (ref 134–144)

## 2020-05-25 LAB — PRO B NATRIURETIC PEPTIDE: NT-Pro BNP: 3084 pg/mL — ABNORMAL HIGH (ref 0–301)

## 2020-05-25 LAB — MAGNESIUM: Magnesium: 1.9 mg/dL (ref 1.6–2.3)

## 2020-05-25 MED ORDER — CARBIDOPA-LEVODOPA ER 25-100 MG PO TBCR
EXTENDED_RELEASE_TABLET | ORAL | 1 refills | Status: DC
Start: 2020-05-25 — End: 2020-06-20

## 2020-05-25 NOTE — Patient Instructions (Addendum)
1.  Take carbidopa/levodopa 25/100 CR, 2 tablets in the AM, an increase, and then 5 other dosages in the day 2.  We will send PT to home 3.  We will send a wheelchair evaluation  The physicians and staff at [facility] are committed to providing excellent care. You may receive a survey requesting feedback about your experience at our office. We strive to receive "very good" responses to the survey questions. If you feel that your experience would prevent you from giving the office a "very good " response, please contact our office to try to remedy the situation. We may be reached at [phone]. Thank you for taking the time out of your busy day to complete the survey.

## 2020-05-25 NOTE — Addendum Note (Signed)
Addended by: Ulice Brilliant T on: 05/25/2020 04:58 PM   Modules accepted: Orders

## 2020-05-26 ENCOUNTER — Other Ambulatory Visit: Payer: Medicare Other

## 2020-05-26 ENCOUNTER — Ambulatory Visit: Payer: Medicare Other

## 2020-05-26 DIAGNOSIS — I428 Other cardiomyopathies: Secondary | ICD-10-CM

## 2020-05-26 DIAGNOSIS — I5042 Chronic combined systolic (congestive) and diastolic (congestive) heart failure: Secondary | ICD-10-CM | POA: Diagnosis not present

## 2020-05-27 DIAGNOSIS — H5010 Unspecified exotropia: Secondary | ICD-10-CM | POA: Diagnosis not present

## 2020-05-27 DIAGNOSIS — Z9181 History of falling: Secondary | ICD-10-CM | POA: Diagnosis not present

## 2020-05-27 DIAGNOSIS — I130 Hypertensive heart and chronic kidney disease with heart failure and stage 1 through stage 4 chronic kidney disease, or unspecified chronic kidney disease: Secondary | ICD-10-CM | POA: Diagnosis not present

## 2020-05-27 DIAGNOSIS — Z86718 Personal history of other venous thrombosis and embolism: Secondary | ICD-10-CM | POA: Diagnosis not present

## 2020-05-27 DIAGNOSIS — I252 Old myocardial infarction: Secondary | ICD-10-CM | POA: Diagnosis not present

## 2020-05-27 DIAGNOSIS — E782 Mixed hyperlipidemia: Secondary | ICD-10-CM | POA: Diagnosis not present

## 2020-05-27 DIAGNOSIS — Z79899 Other long term (current) drug therapy: Secondary | ICD-10-CM | POA: Diagnosis not present

## 2020-05-27 DIAGNOSIS — K219 Gastro-esophageal reflux disease without esophagitis: Secondary | ICD-10-CM | POA: Diagnosis not present

## 2020-05-27 DIAGNOSIS — R32 Unspecified urinary incontinence: Secondary | ICD-10-CM | POA: Diagnosis not present

## 2020-05-27 DIAGNOSIS — J4520 Mild intermittent asthma, uncomplicated: Secondary | ICD-10-CM | POA: Diagnosis not present

## 2020-05-27 DIAGNOSIS — G903 Multi-system degeneration of the autonomic nervous system: Secondary | ICD-10-CM | POA: Diagnosis not present

## 2020-05-27 DIAGNOSIS — I5041 Acute combined systolic (congestive) and diastolic (congestive) heart failure: Secondary | ICD-10-CM | POA: Diagnosis not present

## 2020-05-27 DIAGNOSIS — N189 Chronic kidney disease, unspecified: Secondary | ICD-10-CM | POA: Diagnosis not present

## 2020-05-27 DIAGNOSIS — J3489 Other specified disorders of nose and nasal sinuses: Secondary | ICD-10-CM | POA: Diagnosis not present

## 2020-05-30 DIAGNOSIS — H532 Diplopia: Secondary | ICD-10-CM | POA: Diagnosis not present

## 2020-05-30 DIAGNOSIS — H5310 Unspecified subjective visual disturbances: Secondary | ICD-10-CM | POA: Diagnosis not present

## 2020-05-30 DIAGNOSIS — H2513 Age-related nuclear cataract, bilateral: Secondary | ICD-10-CM | POA: Diagnosis not present

## 2020-06-05 ENCOUNTER — Ambulatory Visit: Payer: Medicare Other | Admitting: Cardiology

## 2020-06-05 ENCOUNTER — Other Ambulatory Visit: Payer: Self-pay

## 2020-06-05 ENCOUNTER — Encounter: Payer: Self-pay | Admitting: Cardiology

## 2020-06-05 VITALS — BP 110/78 | HR 62 | Ht 66.0 in | Wt 164.0 lb

## 2020-06-05 DIAGNOSIS — I428 Other cardiomyopathies: Secondary | ICD-10-CM | POA: Diagnosis not present

## 2020-06-05 DIAGNOSIS — I5032 Chronic diastolic (congestive) heart failure: Secondary | ICD-10-CM | POA: Diagnosis not present

## 2020-06-05 DIAGNOSIS — I130 Hypertensive heart and chronic kidney disease with heart failure and stage 1 through stage 4 chronic kidney disease, or unspecified chronic kidney disease: Secondary | ICD-10-CM

## 2020-06-05 DIAGNOSIS — N183 Chronic kidney disease, stage 3 unspecified: Secondary | ICD-10-CM

## 2020-06-05 DIAGNOSIS — E782 Mixed hyperlipidemia: Secondary | ICD-10-CM | POA: Diagnosis not present

## 2020-06-05 DIAGNOSIS — I447 Left bundle-branch block, unspecified: Secondary | ICD-10-CM

## 2020-06-05 DIAGNOSIS — Z86718 Personal history of other venous thrombosis and embolism: Secondary | ICD-10-CM

## 2020-06-05 NOTE — Progress Notes (Addendum)
Rachel [name] Date of Birth: 01-Sep-1950 MRN: [id number] Primary Care Provider:Shaw, Gwyndolyn Saxon, MD Primary Cardiologist: Rex Kras, DO, [facility] (established care 03/10/2020)  Date: 06/05/20 Last Office Visit: 04/10/2020  Chief Complaint  Patient presents with   Chronic combined systolic and diastolic heart failure   Follow-up    HPI  Rachel [name] is a 69 y.o.  female who presents to the office with a chief complaint of " heart failure management." Patient's past medical history and cardiovascular risk factors include: Chronic systolic and diastolic heart failure/stage C/NYHA class II, nonischemic cardiomyopathy, multiple system atrophy, asthma, history of left lower extremity DVT, hypertension, hyperlipidemia, hypothyroidism, left bundle branch block, advanced age, postmenopausal female.  Patient is accompanied by her husband at today's office visit.  Patient presented to the hospital back in July 2021 for symptoms of shortness of breath.  She was diagnosed with newly discovered cardiomyopathy and  systolic and diastolic heart failure she was started on guideline directed medical therapy.  Due to moderately reduced LVEF, left bundle branch block, and chest pain she also underwent left heart catheterization and was noted to have normal epicardial coronary arteries.  Since then patient has been followed for up titration of guideline directed medical therapy.  She was started on Entresto and has tolerated 24/26 mg p.o. twice daily well without any symptoms of hypotension or acute kidney injury.  We tried reinitiation of spironolactone but was unable to tolerate it due to worsening kidney function.  Other guideline directed medical therapy were initiated in a stepwise fashion.  Patient underwent an echocardiogram since last office visit which notes recovery of her LVEF function.  Patient is congratulated on her efforts and success thus far.  Since last office visit she has lost an additional 8  pounds.  She is overall euvolemic.  No hospitalizations or urgent care visits for cardiovascular symptoms or congestive heart failure.   ALLERGIES: Allergies  Allergen Reactions   Fosamax [Alendronate] Other (See Comments)    headache   Symbicort [Budesonide-Formoterol Fumarate] Other (See Comments)    Shakes and muscle weakness   MEDICATION LIST PRIOR TO VISIT: Current Outpatient Medications on File Prior to Visit  Medication Sig Dispense Refill   albuterol (PROVENTIL) (2.5 MG/3ML) 0.083% nebulizer solution Take 3 mLs (2.5 mg total) by nebulization every 6 (six) hours as needed for wheezing or shortness of breath. 120 mL 11   amantadine (SYMMETREL) 100 MG capsule TAKE 1 CAPSULE BY MOUTH THREE TIMES DAILY 180 capsule 0   Carbidopa-Levodopa ER (SINEMET CR) 25-100 MG tablet controlled release 2 tablets in the AM, an increase, and then 5 other dosages in the day 630 tablet 1   citalopram (CELEXA) 20 MG tablet Take 20 mg by mouth daily.     DULERA 100-5 MCG/ACT AERO Inhale 2 puffs into the lungs in the morning and at bedtime.     famotidine (PEPCID) 40 MG tablet Take 1 tablet (40 mg total) by mouth daily. 30 tablet 5   levothyroxine (SYNTHROID) 112 MCG tablet Take 112 mcg by mouth daily.     LINZESS 290 MCG CAPS capsule Take 290 mcg by mouth daily.     metoprolol succinate (TOPROL-XL) 50 MG 24 hr tablet Take 1 tablet (50 mg total) by mouth daily. Take with or immediately following a meal. 30 tablet 0   montelukast (SINGULAIR) 10 MG tablet Take 1 tablet (10 mg total) by mouth at bedtime. 30 tablet 11   Multiple Vitamin (MULTIVITAMIN WITH MINERALS) TABS tablet Take 2 tablets by  mouth daily.      NON FORMULARY Take by mouth. HEMP OIL CBD     pantoprazole (PROTONIX) 40 MG tablet Take 1 tablet (40 mg total) by mouth 2 (two) times daily. 60 tablet 5   pravastatin (PRAVACHOL) 40 MG tablet Take 1 tablet (40 mg total) by mouth daily at 6 PM. 30 tablet 0   sacubitril-valsartan  (ENTRESTO) 24-26 MG Take 1 tablet by mouth 2 (two) times daily. 180 tablet 0   vitamin E 100 UNIT capsule Take 200 Units by mouth daily.      VOLTAREN 1 % GEL Apply 1 application topically 4 (four) times daily as needed (pain).      ipratropium (ATROVENT) 0.06 % nasal spray Use 2 sprays in each nostril every 6 hours if needed to dry up nose. 15 mL 5   Current Facility-Administered Medications on File Prior to Visit  Medication Dose Route Frequency Provider Last Rate Last Admin   mometasone-formoterol (DULERA) 100-5 MCG/ACT inhaler 2 puff  2 puff Inhalation BID Spero Geralds, MD        PAST MEDICAL HISTORY: Past Medical History:  Diagnosis Date   Allergic rhinitis    Anemia    Arthritis    Asthma    Blood clot in vein  age 75   left leg   Depression    GERD (gastroesophageal reflux disease)    Hyperlipidemia    Hypertension    pt denies, in physician record   Hypothyroidism    Kidney stones    Multiple pregnancy loss, not currently pregnant    multiple miscarriages-pt.unsure of #   Multiple system atrophy ([place]) 2015   Nephrolithiasis    hx of kidney stones, last 6 months ago   Osteopenia    Osteoporosis    Parkinsonism ([place])    Tendinitis    left foot   Vitamin D deficiency     PAST SURGICAL HISTORY: Past Surgical History:  Procedure Laterality Date   BREAST EXCISIONAL BIOPSY Right    BREAST SURGERY     rt. lumpectomy, benign   colonscopy  2011   cystoscopy  2012   JOINT REPLACEMENT  05-2007   right knee replacment/revision   KIDNEY STONE SURGERY     LEFT HEART CATH AND CORONARY ANGIOGRAPHY N/A 02/21/2020   Procedure: LEFT HEART CATH AND CORONARY ANGIOGRAPHY;  Surgeon: Adrian Prows, MD;  Location: [place] [facility];  Service: Cardiovascular;  Laterality: N/A;   REPLACEMENT TOTAL KNEE Left 06/05/12   TOTAL KNEE REVISION  06/03/2012   Procedure: TOTAL KNEE REVISION;  Surgeon: Gearlean Alf, MD;  Location: [facility];  Service:  Orthopedics;  Laterality: Left;  Revision of a Left Uni Knee to a Total Knee Arthroplasty    FAMILY HISTORY: The patient's family history includes Asthma in her sister; Breast cancer in her sister; Diabetes in her mother; Heart attack in her brother; Heart failure in her father, mother, and sister; Thyroid disease in her mother.   SOCIAL HISTORY:  The patient  reports that she has never smoked. She has never used smokeless tobacco. She reports that she does not drink alcohol and does not use drugs.  Review of Systems  Constitutional: Negative for chills and fever.  HENT: Negative for hoarse voice and nosebleeds.   Eyes: Negative for discharge, double vision and pain.  Cardiovascular: Negative for chest pain, claudication, dyspnea on exertion, leg swelling, near-syncope, orthopnea, palpitations, paroxysmal nocturnal dyspnea and syncope.  Respiratory: Positive for shortness of breath (at baseline).  Negative for hemoptysis.   Musculoskeletal: Negative for muscle cramps and myalgias.  Gastrointestinal: Negative for abdominal pain, constipation, diarrhea, hematemesis, hematochezia, melena, nausea and vomiting.    PHYSICAL EXAM: Vitals with BMI 06/05/2020 05/25/2020 05/23/2020  Height 5\' 6"  5\' 6"  -  Weight 164 lbs 167 lbs -  BMI 32.95 18.84 -  Systolic 166 063 016  Diastolic 78 70 78  Pulse 62 62 56    CONSTITUTIONAL: Age-appropriate female, hemodynamically stable, well-nourished. No acute distress.  SKIN: Skin is warm and dry. No rash noted. No cyanosis. No pallor. No jaundice HEAD: Normocephalic and atraumatic.  EYES: No scleral icterus MOUTH/THROAT: Moist oral membranes.  NECK: No JVD present. No thyromegaly noted. No carotid bruits  LYMPHATIC: No visible cervical adenopathy.  CHEST Normal respiratory effort. No intercostal retractions  LUNGS: Clear to auscultation bilaterally no stridor. No wheezes. No rales.  CARDIOVASCULAR: Regular, positive S1-S2, no murmurs rubs or gallops  appreciated. ABDOMINAL: Nonobese, soft, nontender, nondistended, positive bowel sounds in all 4 quadrants no apparent ascites.  EXTREMITIES: No pitting edema. HEMATOLOGIC: No significant bruising NEUROLOGIC: Oriented to person, place, and time. Nonfocal. Normal muscle tone.  PSYCHIATRIC: Normal mood and affect. Normal behavior. Cooperative  CARDIAC DATABASE: EKG: 02/19/2020: Normal sinus rhythm, 66 bpm, LVH, probable old anteroseptal infarct, ST-T changes in the high lateral and lateral leads suggestive of possible anterolateral ischemia, QTC 494 ms, compared to prior EKG left bundle branch block is absent.  Echocardiogram: 05/26/2020:  Normal LV systolic function with visual EF 50-55%. Left ventricle cavity is normal in size. Normal global wall motion. Doppler evidence of grade I (impaired) diastolic dysfunction, normal LAP.  Left atrial cavity is mildly dilated.  Mild (Grade I) mitral regurgitation.  Mild to moderate tricuspid regurgitation. No evidence of pulmonary  hypertension. RVSP measures 33 mmHg.  Mild pulmonic regurgitation.  IVC is dilated with a respiratory response of >50%.  Compared to prior study dated 02/17/2020 LVEF has improved from 30-35% to 50-55%, Now grade I diastolic dysfunction, otherwise no other significant change.  Stress Testing:  NA  Heart Catheterization: Left Heart Catheterization 02/21/20:  There is severe left ventricular systolic dysfunction. The left ventricular ejection fraction is less than 25% by visual estimate. LV end diastolic pressure is normal. Normal coronary arteries, right dominant circulation.  LABORATORY DATA: CBC Latest Ref Rng & Units 04/11/2020 02/19/2020 02/18/2020  WBC 3.4 - 10.8 x10E3/uL 5.1 7.5 7.7  Hemoglobin 11.1 - 15.9 g/dL 12.4 11.5(L) 10.5(L)  Hematocrit 34.0 - 46.6 % 37.3 35.4(L) 33.6(L)  Platelets 150 - 400 K/uL - 267 245    CMP Latest Ref Rng & Units 05/24/2020 04/27/2020 04/17/2020  Glucose 65 - 99 mg/dL 84 93 92  BUN 8  - 27 mg/dL 22 20 23   Creatinine 0.57 - 1.00 mg/dL 1.37(H) 1.65(H) 1.39(H)  Sodium 134 - 144 mmol/L 139 137 139  Potassium 3.5 - 5.2 mmol/L 4.1 4.3 4.3  Chloride 96 - 106 mmol/L 101 99 99  CO2 20 - 29 mmol/L 26 25 27   Calcium 8.7 - 10.3 mg/dL 9.1 9.8 9.8  Total Protein 6.5 - 8.1 g/dL - - -  Total Bilirubin 0.3 - 1.2 mg/dL - - -  Alkaline Phos 38 - 126 U/L - - -  AST 15 - 41 U/L - - -  ALT 0 - 44 U/L - - -   Lipid Panel     Component Value Date/Time   CHOL 236 (H) 02/18/2020 0602   TRIG 47 02/18/2020 0602   HDL 65  02/18/2020 0602   CHOLHDL 3.6 02/18/2020 0602   VLDL 9 02/18/2020 0602   LDLCALC 162 (H) 02/18/2020 0602    No results found for: HGBA1C No components found for: NTPROBNP Lab Results  Component Value Date   TSH 1.862 02/18/2020    Cardiac Panel (last 3 results) No results for input(s): CKTOTAL, CKMB, TROPONINIHS, RELINDX in the last 72 hours.  IMPRESSION:    ICD-10-CM   1. Chronic heart failure with preserved ejection fraction (HFpEF) (HCC)  I50.32   2. Recovered nonischemic cardiomyopathy ([place])  I42.8   3. Mixed hyperlipidemia  E78.2   4. Hypertensive heart and kidney disease with HF and with CKD stage III (HCC)  I13.0    N18.30   5. LBBB (left bundle branch block)  I44.7   6. Hx of deep venous thrombosis  Z86.718      RECOMMENDATIONS: Rachel [name] is a 69 y.o. female whose past medical history and cardiovascular risk factors include: Chronic systolic and diastolic heart failure/stage C/NYHA class II, nonischemic cardiomyopathy, multiple system atrophy, asthma, history of left lower extremity DVT, hypertension, hyperlipidemia, hypothyroidism, left bundle branch block, advanced age, postmenopausal female.  Chronic heart failure preserved EF, stage C, NYHA class II:  Last heart failure hospitalization: July 2021.  Patient remains euvolemic, has lost an additional 8 pounds since last office visit, and repeat echocardiogram shows improvement in LVEF but with  grade 1 diastolic impairment.  Medications reconciled.  Continue Entresto 24/26 mg p.o. twice daily.  Patient did not tolerate up titration of Entresto due to hypotension and acute kidney injury.  We also tried the addition of spironolactone which also led to elevated serum creatinine consistent with acute kidney injury.  We will continue current medical therapy for now.  Recovered nonischemic cardiomyopathy: See above  Hyperlipidemia:  Started on pravastatin on 02/18/2020.  Patient tolerated the medication well without any side effects or intolerances.   Recommend repeat lipid profile with her upcoming yearly physical.  Left bundle branch block: Continue to monitor.  FINAL MEDICATION LIST END OF ENCOUNTER: No orders of the defined types were placed in this encounter.   Medications Discontinued During This Encounter  Medication Reason   ondansetron (ZOFRAN ODT) 4 MG disintegrating tablet Completed Course     Current Outpatient Medications:    albuterol (PROVENTIL) (2.5 MG/3ML) 0.083% nebulizer solution, Take 3 mLs (2.5 mg total) by nebulization every 6 (six) hours as needed for wheezing or shortness of breath., Disp: 120 mL, Rfl: 11   amantadine (SYMMETREL) 100 MG capsule, TAKE 1 CAPSULE BY MOUTH THREE TIMES DAILY, Disp: 180 capsule, Rfl: 0   Carbidopa-Levodopa ER (SINEMET CR) 25-100 MG tablet controlled release, 2 tablets in the AM, an increase, and then 5 other dosages in the day, Disp: 630 tablet, Rfl: 1   citalopram (CELEXA) 20 MG tablet, Take 20 mg by mouth daily., Disp: , Rfl:    DULERA 100-5 MCG/ACT AERO, Inhale 2 puffs into the lungs in the morning and at bedtime., Disp: , Rfl:    famotidine (PEPCID) 40 MG tablet, Take 1 tablet (40 mg total) by mouth daily., Disp: 30 tablet, Rfl: 5   levothyroxine (SYNTHROID) 112 MCG tablet, Take 112 mcg by mouth daily., Disp: , Rfl:    LINZESS 290 MCG CAPS capsule, Take 290 mcg by mouth daily., Disp: , Rfl:    metoprolol  succinate (TOPROL-XL) 50 MG 24 hr tablet, Take 1 tablet (50 mg total) by mouth daily. Take with or immediately following a meal., Disp: 30  tablet, Rfl: 0   montelukast (SINGULAIR) 10 MG tablet, Take 1 tablet (10 mg total) by mouth at bedtime., Disp: 30 tablet, Rfl: 11   Multiple Vitamin (MULTIVITAMIN WITH MINERALS) TABS tablet, Take 2 tablets by mouth daily. , Disp: , Rfl:    NON FORMULARY, Take by mouth. HEMP OIL CBD, Disp: , Rfl:    pantoprazole (PROTONIX) 40 MG tablet, Take 1 tablet (40 mg total) by mouth 2 (two) times daily., Disp: 60 tablet, Rfl: 5   pravastatin (PRAVACHOL) 40 MG tablet, Take 1 tablet (40 mg total) by mouth daily at 6 PM., Disp: 30 tablet, Rfl: 0   sacubitril-valsartan (ENTRESTO) 24-26 MG, Take 1 tablet by mouth 2 (two) times daily., Disp: 180 tablet, Rfl: 0   vitamin E 100 UNIT capsule, Take 200 Units by mouth daily. , Disp: , Rfl:    VOLTAREN 1 % GEL, Apply 1 application topically 4 (four) times daily as needed (pain). , Disp: , Rfl:    ipratropium (ATROVENT) 0.06 % nasal spray, Use 2 sprays in each nostril every 6 hours if needed to dry up nose., Disp: 15 mL, Rfl: 5  Current Facility-Administered Medications:    mometasone-formoterol (DULERA) 100-5 MCG/ACT inhaler 2 puff, 2 puff, Inhalation, BID, Spero Geralds, MD  No orders of the defined types were placed in this encounter.  --Continue cardiac medications as reconciled in final medication list. --Return in about 6 months (around 12/03/2020) for heart failure management.. Or sooner if needed. --Continue follow-up with your primary care physician regarding the management of your other chronic comorbid conditions.  Patient's questions and concerns were addressed to her satisfaction. She voices understanding of the instructions provided during this encounter.   This note was created using a voice recognition software as a result there may be grammatical errors inadvertently enclosed that do not reflect the  nature of this encounter. Every attempt is made to correct such errors.  Rex Kras, [place], [facility]  Pager: [phone] Office: [phone]

## 2020-06-06 ENCOUNTER — Telehealth: Payer: Self-pay

## 2020-06-06 NOTE — Telephone Encounter (Signed)
Received call from [facility] with [facility] requested PT orders on this patient. She states she will do 1 week 1, 2 week 3 and 1 week 4 for health physcal therapy.

## 2020-06-06 NOTE — Telephone Encounter (Signed)
Verbal given 

## 2020-06-09 DIAGNOSIS — E785 Hyperlipidemia, unspecified: Secondary | ICD-10-CM | POA: Diagnosis not present

## 2020-06-09 DIAGNOSIS — E039 Hypothyroidism, unspecified: Secondary | ICD-10-CM | POA: Diagnosis not present

## 2020-06-09 DIAGNOSIS — E559 Vitamin D deficiency, unspecified: Secondary | ICD-10-CM | POA: Diagnosis not present

## 2020-06-13 ENCOUNTER — Ambulatory Visit: Payer: Medicare Other | Admitting: Internal Medicine

## 2020-06-13 NOTE — Progress Notes (Deleted)
Rachel [name]    [id number]    1951/03/17  Primary Care Physician:Shaw, Gwyndolyn Saxon, MD Date of Appointment: 06/13/2020 Established Patient Visit  Chief complaint:   No chief complaint on file.   HPI: Rachel [name] is a 69 y.o. woman who presents for follow up for: Chronic cough and dyspnea. Cough is with eating and she was started on PPI. Has wheezing and dyspnea, uses albuterol prn. Diagnosed with asthma in her 45s. No childhood respiratory disease.   Interval Updates:  GERD improved on PPI. Hasn't needed albuterol inhaler since starting breo.   Current Regimen: albuterol prn - taking 4-5 times/week. Breo. Montelukast.  Asthma Triggers: anxiety, exertion, seasonal allergies.  Exacerbations in the last year: none History of hospitalization or intubation: never Hives: none Allergy Testing: a long time ago, doesn't remember.  GERD: yes, on PPI Allergic Rhinitis: yes on montelukast.  Asthma Control Test:  Asthma Control Test ACT Total Score  12/20/2019 22  09/22/2019 18   I have reviewed the patient's family social and past medical history and updated as appropriate.   Past Medical History:  Diagnosis Date  . Allergic rhinitis   . Anemia   . Arthritis   . Asthma   . Blood clot in vein  age 69   left leg  . Depression   . GERD (gastroesophageal reflux disease)   . Hyperlipidemia   . Hypertension    pt denies, in physician record  . Hypothyroidism   . Kidney stones   . Multiple pregnancy loss, not currently pregnant    multiple miscarriages-pt.unsure of #  . Multiple system atrophy ([place]) 2015  . Nephrolithiasis    hx of kidney stones, last 6 months ago  . Osteopenia   . Osteoporosis   . Parkinsonism ([place])   . Tendinitis    left foot  . Vitamin D deficiency     Past Surgical History:  Procedure Laterality Date  . BREAST EXCISIONAL BIOPSY Right   . BREAST SURGERY     rt. lumpectomy, benign  . colonscopy  2011  . cystoscopy  2012  . JOINT  REPLACEMENT  05-2007   right knee replacment/revision  . KIDNEY STONE SURGERY    . LEFT HEART CATH AND CORONARY ANGIOGRAPHY N/A 02/21/2020   Procedure: LEFT HEART CATH AND CORONARY ANGIOGRAPHY;  Surgeon: Adrian Prows, MD;  Location: [place] [facility];  Service: Cardiovascular;  Laterality: N/A;  . REPLACEMENT TOTAL KNEE Left 06/05/12  . TOTAL KNEE REVISION  06/03/2012   Procedure: TOTAL KNEE REVISION;  Surgeon: Gearlean Alf, MD;  Location: [facility];  Service: Orthopedics;  Laterality: Left;  Revision of a Left Uni Knee to a Total Knee Arthroplasty    Family History  Problem Relation Age of Onset  . Diabetes Mother   . Heart failure Mother        CHF  . Thyroid disease Mother   . Heart failure Father        CHF  . Heart attack Brother   . Breast cancer Sister   . Asthma Sister   . Heart failure Sister     Social History   Occupational History  . Occupation: retired/disabled    Employer: [employer]    Comment: receptionist  Tobacco Use  . Smoking status: Never Smoker  . Smokeless tobacco: Never Used  Vaping Use  . Vaping Use: Never used  Substance and Sexual Activity  . Alcohol use: No    Alcohol/week: 0.0  standard drinks  . Drug use: No  . Sexual activity: Never    Partners: Male    Birth control/protection: Post-menopausal   Physical Exam: No audible wheezing. Able to complete full sentences   Data Reviewed: Imaging: I have personally reviewed the lung windows from her CT A/P in September 2020. Limited lung base evaluation shows mild peribronchial thickening. No pleural effusion or pneumonia.    PFTs:  PFT Results Latest Ref Rng & Units 08/20/2019  FVC-Pre L 2.35  FVC-Predicted Pre % 71  FVC-Post L 2.32  FVC-Predicted Post % 70  Pre FEV1/FVC % % 67  Post FEV1/FCV % % 68  FEV1-Pre L 1.56  FEV1-Predicted Pre % 62  FEV1-Post L 1.57  DLCO uncorrected ml/min/mmHg 21.15  DLCO UNC% % 101  DLVA Predicted % 132  TLC L 5.87  TLC % Predicted % 109  RV %  Predicted % 141   I have personally reviewed the patient's PFTs and they are notable for moderate airflow limitation without a bronchodilator response.  FeNO 10 ppb - not elevated.   Labs: Lab Results  Component Value Date   WBC 5.1 04/11/2020   HGB 12.4 04/11/2020   HCT 37.3 04/11/2020   MCV 91 04/11/2020   PLT 267 02/19/2020    Immunization status: Immunization History  Administered Date(s) Administered  . PFIZER SARS-COV-2 Vaccination 09/19/2019, 10/13/2019  . Tdap 04/24/2013  . Zoster 10/27/2012    Assessment:  Ms. Schlosser is a 69 year old woman with a history of multiple system atrophy with a resting tremor who presents with:  1. Moderate intermittent asthma - improved control.  2.  Gastroesophageal reflux disease - controlled on PPI 3.  Allergic Rhinitis - well controlled on singulair and flonase  Plan/Recommendations: Continue Breo for asthma and prn albuterol.  Continue PPI for GERD Continue flonase and singulair for rhinitis. Well controlled.   I spent 20 minutes on 06/13/2020 in care of this patient including face to face time and non-face to face time spent charting, review of outside records, and coordination of care.  Return to Care: No follow-ups on file.  Lenice Llamas, MD Pulmonary and [place]

## 2020-06-16 DIAGNOSIS — I700 Atherosclerosis of aorta: Secondary | ICD-10-CM | POA: Diagnosis not present

## 2020-06-16 DIAGNOSIS — E785 Hyperlipidemia, unspecified: Secondary | ICD-10-CM | POA: Diagnosis not present

## 2020-06-16 DIAGNOSIS — I10 Essential (primary) hypertension: Secondary | ICD-10-CM | POA: Diagnosis not present

## 2020-06-16 DIAGNOSIS — Z0000 Encounter for general adult medical examination without abnormal findings: Secondary | ICD-10-CM | POA: Diagnosis not present

## 2020-06-16 DIAGNOSIS — R82998 Other abnormal findings in urine: Secondary | ICD-10-CM | POA: Diagnosis not present

## 2020-06-19 ENCOUNTER — Telehealth: Payer: Self-pay | Admitting: Neurology

## 2020-06-20 ENCOUNTER — Other Ambulatory Visit: Payer: Self-pay | Admitting: Neurology

## 2020-06-20 ENCOUNTER — Other Ambulatory Visit: Payer: Self-pay | Admitting: Allergy and Immunology

## 2020-06-20 ENCOUNTER — Other Ambulatory Visit: Payer: Self-pay | Admitting: Internal Medicine

## 2020-06-20 MED ORDER — AMANTADINE HCL 100 MG PO CAPS
100.0000 mg | ORAL_CAPSULE | Freq: Three times a day (TID) | ORAL | 1 refills | Status: DC
Start: 2020-06-20 — End: 2020-08-17

## 2020-06-20 MED ORDER — CARBIDOPA-LEVODOPA ER 25-100 MG PO TBCR: EXTENDED_RELEASE_TABLET | ORAL | 1 refills | Status: DC

## 2020-06-20 NOTE — Telephone Encounter (Signed)
Rx(s) sent to pharmacy electronically.  

## 2020-06-21 ENCOUNTER — Other Ambulatory Visit: Payer: Self-pay | Admitting: *Deleted

## 2020-06-21 MED ORDER — PANTOPRAZOLE SODIUM 40 MG PO TBEC
40.0000 mg | DELAYED_RELEASE_TABLET | Freq: Two times a day (BID) | ORAL | 1 refills | Status: DC
Start: 2020-06-21 — End: 2023-06-19

## 2020-06-21 MED ORDER — IPRATROPIUM BROMIDE 0.06 % NA SOLN: NASAL | 1 refills | Status: AC

## 2020-06-21 MED ORDER — FAMOTIDINE 40 MG PO TABS
40.0000 mg | ORAL_TABLET | Freq: Every day | ORAL | 1 refills | Status: DC
Start: 2020-06-21 — End: 2021-03-19

## 2020-06-26 DIAGNOSIS — Z79899 Other long term (current) drug therapy: Secondary | ICD-10-CM | POA: Diagnosis not present

## 2020-06-26 DIAGNOSIS — I130 Hypertensive heart and chronic kidney disease with heart failure and stage 1 through stage 4 chronic kidney disease, or unspecified chronic kidney disease: Secondary | ICD-10-CM | POA: Diagnosis not present

## 2020-06-26 DIAGNOSIS — Z9181 History of falling: Secondary | ICD-10-CM | POA: Diagnosis not present

## 2020-06-26 DIAGNOSIS — Z86718 Personal history of other venous thrombosis and embolism: Secondary | ICD-10-CM | POA: Diagnosis not present

## 2020-06-26 DIAGNOSIS — R32 Unspecified urinary incontinence: Secondary | ICD-10-CM | POA: Diagnosis not present

## 2020-06-26 DIAGNOSIS — H5010 Unspecified exotropia: Secondary | ICD-10-CM | POA: Diagnosis not present

## 2020-06-26 DIAGNOSIS — I252 Old myocardial infarction: Secondary | ICD-10-CM | POA: Diagnosis not present

## 2020-06-26 DIAGNOSIS — G903 Multi-system degeneration of the autonomic nervous system: Secondary | ICD-10-CM | POA: Diagnosis not present

## 2020-06-26 DIAGNOSIS — N189 Chronic kidney disease, unspecified: Secondary | ICD-10-CM | POA: Diagnosis not present

## 2020-06-26 DIAGNOSIS — J4520 Mild intermittent asthma, uncomplicated: Secondary | ICD-10-CM | POA: Diagnosis not present

## 2020-06-26 DIAGNOSIS — J3489 Other specified disorders of nose and nasal sinuses: Secondary | ICD-10-CM | POA: Diagnosis not present

## 2020-06-26 DIAGNOSIS — I5041 Acute combined systolic (congestive) and diastolic (congestive) heart failure: Secondary | ICD-10-CM | POA: Diagnosis not present

## 2020-06-26 DIAGNOSIS — E782 Mixed hyperlipidemia: Secondary | ICD-10-CM | POA: Diagnosis not present

## 2020-06-26 DIAGNOSIS — K219 Gastro-esophageal reflux disease without esophagitis: Secondary | ICD-10-CM | POA: Diagnosis not present

## 2020-07-06 ENCOUNTER — Telehealth: Payer: Self-pay | Admitting: Neurology

## 2020-07-06 NOTE — Telephone Encounter (Signed)
Spoke with pt husband informed him that Dr Tat recommend she f/u with PCP to make sure nothing else going on (UTI, infection at the tooth site, etc) he verbalized understanding , stated he would call PCP

## 2020-07-06 NOTE — Telephone Encounter (Signed)
Patient's husband called in very worried about the patient. He said she had a tooth removed and he feels that due to the medication she took before (Oxycodone) the procedure for nerves, it has messed with her. She has gone down hill since then. She is very unsteady on her feet and has been hallucinating. She is not taking the Oxycodone now, she only took it before. Her husband is afraid to leave her for more than 5 minutes.

## 2020-07-06 NOTE — Telephone Encounter (Signed)
I'm not sure what is going on but would recommend she f/u with PCP to make sure nothing else going on (UTI, infection at the tooth site, etc)

## 2020-07-14 DIAGNOSIS — K1379 Other lesions of oral mucosa: Secondary | ICD-10-CM | POA: Diagnosis not present

## 2020-07-14 DIAGNOSIS — K121 Other forms of stomatitis: Secondary | ICD-10-CM | POA: Diagnosis not present

## 2020-07-14 DIAGNOSIS — I10 Essential (primary) hypertension: Secondary | ICD-10-CM | POA: Diagnosis not present

## 2020-07-17 ENCOUNTER — Telehealth: Payer: Self-pay

## 2020-07-17 NOTE — Telephone Encounter (Signed)
[facility] called to follow up on a request for records. I checked the [place], but I didn't see anything uploaded. I requested another request be sent to the [facility] office.  Received fax & I have sent the last two office visits for this year.  Scanned request on the [facility].

## 2020-07-26 DIAGNOSIS — I252 Old myocardial infarction: Secondary | ICD-10-CM | POA: Diagnosis not present

## 2020-07-26 DIAGNOSIS — J3489 Other specified disorders of nose and nasal sinuses: Secondary | ICD-10-CM | POA: Diagnosis not present

## 2020-07-26 DIAGNOSIS — K219 Gastro-esophageal reflux disease without esophagitis: Secondary | ICD-10-CM | POA: Diagnosis not present

## 2020-07-26 DIAGNOSIS — N189 Chronic kidney disease, unspecified: Secondary | ICD-10-CM | POA: Diagnosis not present

## 2020-07-26 DIAGNOSIS — G903 Multi-system degeneration of the autonomic nervous system: Secondary | ICD-10-CM | POA: Diagnosis not present

## 2020-07-26 DIAGNOSIS — Z9181 History of falling: Secondary | ICD-10-CM | POA: Diagnosis not present

## 2020-07-26 DIAGNOSIS — Z79899 Other long term (current) drug therapy: Secondary | ICD-10-CM | POA: Diagnosis not present

## 2020-07-26 DIAGNOSIS — Z86718 Personal history of other venous thrombosis and embolism: Secondary | ICD-10-CM | POA: Diagnosis not present

## 2020-07-26 DIAGNOSIS — E782 Mixed hyperlipidemia: Secondary | ICD-10-CM | POA: Diagnosis not present

## 2020-07-26 DIAGNOSIS — I130 Hypertensive heart and chronic kidney disease with heart failure and stage 1 through stage 4 chronic kidney disease, or unspecified chronic kidney disease: Secondary | ICD-10-CM | POA: Diagnosis not present

## 2020-07-26 DIAGNOSIS — J4520 Mild intermittent asthma, uncomplicated: Secondary | ICD-10-CM | POA: Diagnosis not present

## 2020-07-26 DIAGNOSIS — H5010 Unspecified exotropia: Secondary | ICD-10-CM | POA: Diagnosis not present

## 2020-07-26 DIAGNOSIS — R32 Unspecified urinary incontinence: Secondary | ICD-10-CM | POA: Diagnosis not present

## 2020-07-26 DIAGNOSIS — I5041 Acute combined systolic (congestive) and diastolic (congestive) heart failure: Secondary | ICD-10-CM | POA: Diagnosis not present

## 2020-08-01 ENCOUNTER — Telehealth: Payer: Self-pay

## 2020-08-01 NOTE — Telephone Encounter (Signed)
Received voicemail from [place] at [facility] requesting verbal orders for this patient. She states the patient had a fall recently. She states the patients son and spouse helped her up. She states the patient is also continually having hallucinations at night. She states she is calling to request a continuation and recertification for 60 days. She would like for the patient to have PT once a week for 8 weeks. She would like to add OT eval, ST eval and eval for home health aid who can help with aiding and dressing the patient dressed because the husband is getting overwhelmed.   Is this ok?

## 2020-08-01 NOTE — Telephone Encounter (Signed)
ok 

## 2020-08-01 NOTE — Telephone Encounter (Signed)
Left detailed message with verbal orders for Rachel [name] at [facility]. Advised a call back with any questions or concerns.

## 2020-08-10 ENCOUNTER — Telehealth: Payer: Self-pay

## 2020-08-10 NOTE — Telephone Encounter (Addendum)
Received call from [facility] with [place] requesting verbal orders for speech therapy 1 time a week for 8 weeks for dysphagia and voiced disorder. Call back if approved.

## 2020-08-11 NOTE — Telephone Encounter (Signed)
Ok per Dr Carles Collet.   Message left on [employer] with verbal.

## 2020-08-16 NOTE — Progress Notes (Signed)
Assessment/Plan:   1.  MSA  -Discussed importance of staying seated at all times, unless somebody is directly with her.  She has had multiple falls.  Discussed consequences of falls.  -Continue carbidopa/levodopa 25/100 CR, 2 tablets in the AM, followed by 5 other single dosages throughout the day.  -d/c amantadine, 100 mg 3 times per day due to hallucinations/confusion (weaning).  Did tell her dyskinesia may return.  -Although I did her wheelchair evaluation last time, I just got the orders received from the home company on it yesterday and those were signed. 2.  Memory change  -Previously scheduled for neurocognitive testing in April, 2021 but canceled.  Can let us know if she changes her mind. 3.  Diplopia  -Saw Dr. Hassell Done years ago at [facility].  Diagnosis was congenital large intermittent exotropia/exophoria  -newer floaters and scintillating scotomas.  Pt to make f/u with Dr. Ellie Lunch. 4.  Dysphagia  -Last MBE in April, 2021.  Recommendations were regular diet, thin liquid 5.  Weight loss  -some due to nausea from med but not convinced that all of this is from meds.  F/u pcp 6.  Hallucinations  -Stopping amantadine.  -If does not help, we can consider the addition of something like quetiapine. 7.  Would like to see the patient back in the next few weeks.  Offered her a February appointment, but the only time that we had was early morning and they declined.  We will keep her on a cancellation list, but they ended up scheduling for 5 months from now.  Subjective:   Rachel [name] was seen today in follow up for MSA.  My previous records were reviewed prior to todays visit as well as outside records available to me.  Patient worked in today at her request.  Patient with husband who supplements the history.  Patient's husband called in early December stating that the patient had some dental work done and she took oxycodone before the procedure and he felt that she was going downhill  afterwards as a result of it.  I told them to make sure that they were following up with her primary care physician to make sure that she did not have a urinary tract infection, infection at the tube site/abscess, etc.  I never heard back after that, with the exception of from physical therapy to continue her home physical therapy.  They state today that the hallucinations are bad - "the people are in the  Home and outside of the home."  States that had yearly labs with [facility] less than a month ago and all was nl.  Has lost appetite and lost lots of weight.  Thinks that some nausea with meds have contributed (not sure if contributed).  PT/OT/ST coming into the home.  Caregivers coming in for bathing 2 days per week.    Current prescribed movement disorder medications: Amantadine, 100 mg 3 times per day (increased) Carbidopa/levodopa 25/100 CR, 2 tablets in the morning followed by 5 additional dosages throughout the day (slight increase since last visit)   ALLERGIES:   Allergies  Allergen Reactions   Fosamax [Alendronate] Other (See Comments)    headache   Symbicort [Budesonide-Formoterol Fumarate] Other (See Comments)    Shakes and muscle weakness    CURRENT MEDICATIONS:  Outpatient Encounter Medications as of 08/17/2020  Medication Sig   albuterol (PROVENTIL) (2.5 MG/3ML) 0.083% nebulizer solution Take 3 mLs (2.5 mg total) by nebulization every 6 (six) hours as needed for wheezing or shortness  of breath.   amantadine (SYMMETREL) 100 MG capsule Take 1 capsule (100 mg total) by mouth 3 (three) times daily.   Carbidopa-Levodopa ER (SINEMET CR) 25-100 MG tablet controlled release 2 tablets in the AM, an increase, and then 5 other dosages in the day   citalopram (CELEXA) 20 MG tablet Take 20 mg by mouth daily.   DULERA 100-5 MCG/ACT AERO Inhale 2 puffs into the lungs in the morning and at bedtime.   famotidine (PEPCID) 40 MG tablet Take 1 tablet (40 mg total) by mouth daily.   ipratropium  (ATROVENT) 0.06 % nasal spray SPRAY 2 SPRAYS IN EACH NOSTRIL EVERY 6 HOURS AS NEEDED FOR RUNNY NOSE   levothyroxine (SYNTHROID) 112 MCG tablet Take 112 mcg by mouth daily.   LINZESS 290 MCG CAPS capsule Take 290 mcg by mouth daily.   metoprolol succinate (TOPROL-XL) 50 MG 24 hr tablet Take 1 tablet (50 mg total) by mouth daily. Take with or immediately following a meal.   montelukast (SINGULAIR) 10 MG tablet TAKE 1 TABLET BY MOUTH ONCE DAILY   Multiple Vitamin (MULTIVITAMIN WITH MINERALS) TABS tablet Take 2 tablets by mouth daily.    pantoprazole (PROTONIX) 40 MG tablet Take 1 tablet (40 mg total) by mouth 2 (two) times daily.   pravastatin (PRAVACHOL) 40 MG tablet Take 1 tablet (40 mg total) by mouth daily at 6 PM.   vitamin E 100 UNIT capsule Take 200 Units by mouth daily.    VOLTAREN 1 % GEL Apply 1 application topically 4 (four) times daily as needed (pain).    NON FORMULARY Take by mouth. HEMP OIL CBD (Patient not taking: Reported on 08/17/2020)   Facility-Administered Encounter Medications as of 08/17/2020  Medication   mometasone-formoterol (DULERA) 100-5 MCG/ACT inhaler 2 puff    Objective:   PHYSICAL EXAMINATION:    VITALS:   Vitals:   08/17/20 1414  BP: 102/60  Pulse: (!) 58  SpO2: 98%  Weight: 149 lb 3.2 oz (67.7 kg)  Height: '5\' 6"'$  (1.676 m)   Wt Readings from Last 3 Encounters:  08/17/20 149 lb 3.2 oz (67.7 kg)  06/05/20 164 lb (74.4 kg)  05/25/20 167 lb (75.8 kg)     GEN:  The patient appears stated age and is in NAD. HEENT:  Normocephalic, atraumatic.  The mucous membranes are moist. The superficial temporal arteries are without ropiness or tenderness. CV: Bradycardic.  Regular. Lungs:  CTAB Neck/HEME:  There are no carotid bruits bilaterally.  Neurological examination:  Orientation: The patient is alert and oriented x3.  She had no problem relating her history. Cranial nerves: There is good facial symmetry with min facial hypomimia. The speech is  fluent and clear. Soft palate rises symmetrically and there is no tongue deviation. Hearing is intact to conversational tone. Sensation: Sensation is intact to light touch throughout Motor: Strength is at least antigravity x4.   Movement examination: Tone: There is nl tone in the UE/LE Abnormal movements: there is rare left leg rest tremor today.  None in the arms today. Coordination:  There is mild decremation with RAM's Gait and Station: pt pushes off and requires mild assist OOC.  She ambulates fairly well with her walker.  I have reviewed and interpreted the following labs independently    Chemistry      Component Value Date/Time   NA 139 05/24/2020 1631   K 4.1 05/24/2020 1631   CL 101 05/24/2020 1631   CO2 26 05/24/2020 1631   BUN 22 05/24/2020 1631  CREATININE 1.37 (H) 05/24/2020 1631      Component Value Date/Time   CALCIUM 9.1 05/24/2020 1631   ALKPHOS 82 11/14/2019 1456   AST 21 11/14/2019 1456   ALT 14 11/14/2019 1456   BILITOT 0.9 11/14/2019 1456       Lab Results  Component Value Date   WBC 5.1 04/11/2020   HGB 12.4 04/11/2020   HCT 37.3 04/11/2020   MCV 91 04/11/2020   PLT 267 02/19/2020    Lab Results  Component Value Date   TSH 1.862 02/18/2020   Emergency room labs from [place] from October 10 also had labs.  Sodium was 137, potassium 3.5, chloride 99, CO2 31, BUN 9, creatinine 0.8, glucose 98, white blood cell 6.2, hemoglobin 11.2, hematocrit 32.2 and platelets 256  Total time spent on today's visit was 30 minutes, including both face-to-face time and nonface-to-face time.  Time included that spent on review of records (prior notes available to me/labs/imaging if pertinent), discussing treatment and goals, answering patient's questions and coordinating care.  Cc:  Marton Redwood, MD

## 2020-08-17 ENCOUNTER — Ambulatory Visit: Payer: Medicare Other | Admitting: Neurology

## 2020-08-17 ENCOUNTER — Other Ambulatory Visit: Payer: Self-pay

## 2020-08-17 ENCOUNTER — Encounter: Payer: Self-pay | Admitting: Neurology

## 2020-08-17 VITALS — BP 102/60 | HR 58 | Ht 66.0 in | Wt 149.2 lb

## 2020-08-17 DIAGNOSIS — R634 Abnormal weight loss: Secondary | ICD-10-CM

## 2020-08-17 DIAGNOSIS — G232 Striatonigral degeneration: Secondary | ICD-10-CM

## 2020-08-17 NOTE — Patient Instructions (Signed)
1.  Decrease amantadine to 1 tablet twice per day for a week and then once per day for a week and then STOP amantadine  The physicians and staff at [facility] are committed to providing excellent care. You may receive a survey requesting feedback about your experience at our office. We strive to receive "very good" responses to the survey questions. If you feel that your experience would prevent you from giving the office a "very good " response, please contact our office to try to remedy the situation. We may be reached at [phone]. Thank you for taking the time out of your busy day to complete the survey.

## 2020-08-18 ENCOUNTER — Other Ambulatory Visit: Payer: Self-pay

## 2020-08-18 MED ORDER — SACUBITRIL-VALSARTAN 24-26 MG PO TABS: 1.0000 | ORAL_TABLET | Freq: Two times a day (BID) | ORAL | 0 refills | Status: DC

## 2020-08-25 DIAGNOSIS — H5010 Unspecified exotropia: Secondary | ICD-10-CM | POA: Diagnosis not present

## 2020-08-25 DIAGNOSIS — Z86718 Personal history of other venous thrombosis and embolism: Secondary | ICD-10-CM | POA: Diagnosis not present

## 2020-08-25 DIAGNOSIS — I252 Old myocardial infarction: Secondary | ICD-10-CM | POA: Diagnosis not present

## 2020-08-25 DIAGNOSIS — K219 Gastro-esophageal reflux disease without esophagitis: Secondary | ICD-10-CM | POA: Diagnosis not present

## 2020-08-25 DIAGNOSIS — Z9181 History of falling: Secondary | ICD-10-CM | POA: Diagnosis not present

## 2020-08-25 DIAGNOSIS — N189 Chronic kidney disease, unspecified: Secondary | ICD-10-CM | POA: Diagnosis not present

## 2020-08-25 DIAGNOSIS — I130 Hypertensive heart and chronic kidney disease with heart failure and stage 1 through stage 4 chronic kidney disease, or unspecified chronic kidney disease: Secondary | ICD-10-CM | POA: Diagnosis not present

## 2020-08-25 DIAGNOSIS — R32 Unspecified urinary incontinence: Secondary | ICD-10-CM | POA: Diagnosis not present

## 2020-08-25 DIAGNOSIS — I5041 Acute combined systolic (congestive) and diastolic (congestive) heart failure: Secondary | ICD-10-CM | POA: Diagnosis not present

## 2020-08-25 DIAGNOSIS — E782 Mixed hyperlipidemia: Secondary | ICD-10-CM | POA: Diagnosis not present

## 2020-08-25 DIAGNOSIS — J4520 Mild intermittent asthma, uncomplicated: Secondary | ICD-10-CM | POA: Diagnosis not present

## 2020-08-25 DIAGNOSIS — G903 Multi-system degeneration of the autonomic nervous system: Secondary | ICD-10-CM | POA: Diagnosis not present

## 2020-08-25 DIAGNOSIS — J3489 Other specified disorders of nose and nasal sinuses: Secondary | ICD-10-CM | POA: Diagnosis not present

## 2020-08-25 DIAGNOSIS — Z79899 Other long term (current) drug therapy: Secondary | ICD-10-CM | POA: Diagnosis not present

## 2020-09-12 ENCOUNTER — Telehealth: Payer: Self-pay

## 2020-09-12 NOTE — Telephone Encounter (Signed)
ok 

## 2020-09-12 NOTE — Telephone Encounter (Signed)
Received cal from [place] at physical therapist at [facility] who states she is calling to get verbal orders for patient.   She is requesting a continuation of physical therapy for 1 week 8 and home health aid 2 week 6.   Verbal given.

## 2020-09-21 DIAGNOSIS — R251 Tremor, unspecified: Secondary | ICD-10-CM | POA: Diagnosis not present

## 2020-09-21 DIAGNOSIS — H2511 Age-related nuclear cataract, right eye: Secondary | ICD-10-CM | POA: Diagnosis not present

## 2020-09-24 DIAGNOSIS — H5010 Unspecified exotropia: Secondary | ICD-10-CM | POA: Diagnosis not present

## 2020-09-24 DIAGNOSIS — N189 Chronic kidney disease, unspecified: Secondary | ICD-10-CM | POA: Diagnosis not present

## 2020-09-24 DIAGNOSIS — Z86718 Personal history of other venous thrombosis and embolism: Secondary | ICD-10-CM | POA: Diagnosis not present

## 2020-09-24 DIAGNOSIS — I5041 Acute combined systolic (congestive) and diastolic (congestive) heart failure: Secondary | ICD-10-CM | POA: Diagnosis not present

## 2020-09-24 DIAGNOSIS — I252 Old myocardial infarction: Secondary | ICD-10-CM | POA: Diagnosis not present

## 2020-09-24 DIAGNOSIS — Z9181 History of falling: Secondary | ICD-10-CM | POA: Diagnosis not present

## 2020-09-24 DIAGNOSIS — J4520 Mild intermittent asthma, uncomplicated: Secondary | ICD-10-CM | POA: Diagnosis not present

## 2020-09-24 DIAGNOSIS — I130 Hypertensive heart and chronic kidney disease with heart failure and stage 1 through stage 4 chronic kidney disease, or unspecified chronic kidney disease: Secondary | ICD-10-CM | POA: Diagnosis not present

## 2020-09-24 DIAGNOSIS — G903 Multi-system degeneration of the autonomic nervous system: Secondary | ICD-10-CM | POA: Diagnosis not present

## 2020-09-24 DIAGNOSIS — E782 Mixed hyperlipidemia: Secondary | ICD-10-CM | POA: Diagnosis not present

## 2020-09-24 DIAGNOSIS — R32 Unspecified urinary incontinence: Secondary | ICD-10-CM | POA: Diagnosis not present

## 2020-09-24 DIAGNOSIS — J3489 Other specified disorders of nose and nasal sinuses: Secondary | ICD-10-CM | POA: Diagnosis not present

## 2020-09-24 DIAGNOSIS — K219 Gastro-esophageal reflux disease without esophagitis: Secondary | ICD-10-CM | POA: Diagnosis not present

## 2020-09-25 DIAGNOSIS — E785 Hyperlipidemia, unspecified: Secondary | ICD-10-CM | POA: Diagnosis not present

## 2020-09-25 DIAGNOSIS — I10 Essential (primary) hypertension: Secondary | ICD-10-CM | POA: Diagnosis not present

## 2020-09-25 DIAGNOSIS — F325 Major depressive disorder, single episode, in full remission: Secondary | ICD-10-CM | POA: Diagnosis not present

## 2020-09-25 DIAGNOSIS — E039 Hypothyroidism, unspecified: Secondary | ICD-10-CM | POA: Diagnosis not present

## 2020-10-05 ENCOUNTER — Telehealth: Payer: Self-pay

## 2020-10-05 NOTE — Telephone Encounter (Signed)
Received a call from [place] stating the patients physical therapist had a family emergency and had to leave town. She wanted to know if she could get verbal orders to extend therapy for an additional three weeks because this was supposed to be the patients last week to therapy.  Verbal orders given.

## 2020-10-12 DIAGNOSIS — H2512 Age-related nuclear cataract, left eye: Secondary | ICD-10-CM | POA: Diagnosis not present

## 2020-10-12 DIAGNOSIS — H25812 Combined forms of age-related cataract, left eye: Secondary | ICD-10-CM | POA: Diagnosis not present

## 2020-10-16 NOTE — Progress Notes (Signed)
Assessment/Plan:   1.  MSA  -Discussed importance of staying seated at all times, unless somebody is directly with her.  She has had multiple falls.  Discussed consequences of falls.  -Continue carbidopa/levodopa 25/100 CR, 2 tablets in the AM, followed by 5 other single dosages throughout the day.  -much better with hallucinations after amantadine discontinued.  -Patient with motorized wheelchair at home. 2.  Memory change  -Previously scheduled for neurocognitive testing in April, 2021 but canceled.  Can let us know if she changes her mind. 3.  Diplopia  -Saw Dr. Hassell Done years ago at [facility].  Diagnosis was congenital large intermittent exotropia/exophoria  -newer floaters and scintillating scotomas.  Pt to make f/u with Dr. Ellie Lunch. 4.  Dysphagia  -Last MBE in April, 2021.  Recommendations were regular diet, thin liquid 5.  Weight loss  -some due to nausea from med but not convinced that all of this is from meds.  F/u pcp 6.  Low BP  -pt on metoprolol and entresto.  Reports that she is taking blood pressure at home and it is not as low as what we got here.  Will email Dr. Trudee Kuster just to make aware of disease/effect of MSA on blood pressure.  Worry that she is going to have syncopal episodes   Subjective:   Rachel [name] was seen today in follow up for MSA.  My previous records were reviewed prior to todays visit as well as outside records available to me.  Patient seen about 8 weeks ago, at which point we stopped her amantadine because of hallucinations.  She reports today that this is so much better/hallucinations are markedly improved.  In regards to her motorized wheelchair, she reports that she has the wheelchair now, but does not use it all the time.  Pt with cataract sx since last visit - had anesthesia for this due to tremor  Current prescribed movement disorder medications:  Carbidopa/levodopa 25/100 CR, 2 tablets in the morning followed by 5 additional dosages throughout the  day   Previous medications: Carbidopa/levodopa 25/100 IR; amantadine (stopped because of memory change/hallucinations) ALLERGIES:   Allergies  Allergen Reactions  . Amantadine Hcl     Other reaction(s): Hallucinations  . Azithromycin     Other reaction(s): thrush  . Crestor [Rosuvastatin]     Other reaction(s): confusion  . Ezetimibe     Other reaction(s): disoriented/confusion  . Ezetimibe-Simvastatin     Other reaction(s): myalgias  . Fosamax [Alendronate] Other (See Comments)    headache  . Miacalcin [Calcitonin]     Other reaction(s): headache  . Symbicort [Budesonide-Formoterol Fumarate] Other (See Comments)    Shakes and muscle weakness    CURRENT MEDICATIONS:  Outpatient Encounter Medications as of 10/17/2020  Medication Sig  . albuterol (PROVENTIL) (2.5 MG/3ML) 0.083% nebulizer solution Take 3 mLs (2.5 mg total) by nebulization every 6 (six) hours as needed for wheezing or shortness of breath.  . Carbidopa-Levodopa ER (SINEMET CR) 25-100 MG tablet controlled release 2 tablets in the AM, an increase, and then 5 other dosages in the day  . citalopram (CELEXA) 20 MG tablet Take 20 mg by mouth daily.  . DULERA 100-5 MCG/ACT AERO Inhale 2 puffs into the lungs in the morning and at bedtime.  . famotidine (PEPCID) 40 MG tablet Take 1 tablet (40 mg total) by mouth daily.  Marland Kitchen ipratropium (ATROVENT) 0.06 % nasal spray SPRAY 2 SPRAYS IN EACH NOSTRIL EVERY 6 HOURS AS NEEDED FOR RUNNY NOSE  . levothyroxine (SYNTHROID)  112 MCG tablet Take 112 mcg by mouth daily.  Marland Kitchen LINZESS 290 MCG CAPS capsule Take 290 mcg by mouth daily.  . metoprolol succinate (TOPROL-XL) 50 MG 24 hr tablet Take 1 tablet (50 mg total) by mouth daily. Take with or immediately following a meal.  . montelukast (SINGULAIR) 10 MG tablet TAKE 1 TABLET BY MOUTH ONCE DAILY  . Multiple Vitamin (MULTIVITAMIN WITH MINERALS) TABS tablet Take 2 tablets by mouth daily.   . pantoprazole (PROTONIX) 40 MG tablet Take 1 tablet (40 mg  total) by mouth 2 (two) times daily.  . pravastatin (PRAVACHOL) 40 MG tablet Take 1 tablet (40 mg total) by mouth daily at 6 PM.  . sacubitril-valsartan (ENTRESTO) 24-26 MG Take 1 tablet by mouth 2 (two) times daily.  . vitamin E 100 UNIT capsule Take 200 Units by mouth daily.   . VOLTAREN 1 % GEL Apply 1 application topically 4 (four) times daily as needed (pain).   . NON FORMULARY Take by mouth. HEMP OIL CBD (Patient not taking: Reported on 10/17/2020)   Facility-Administered Encounter Medications as of 10/17/2020  Medication  . mometasone-formoterol (DULERA) 100-5 MCG/ACT inhaler 2 puff    Objective:   PHYSICAL EXAMINATION:    VITALS:   Vitals:   10/17/20 1507  BP: (!) 92/54  Pulse: 63  SpO2: 98%  Weight: 157 lb (71.2 kg)  Height: '5\' 6"'$  (1.676 m)   Wt Readings from Last 3 Encounters:  10/17/20 157 lb (71.2 kg)  08/17/20 149 lb 3.2 oz (67.7 kg)  06/05/20 164 lb (74.4 kg)     GEN:  The patient appears stated age and is in NAD. HEENT:  Normocephalic, atraumatic.  The mucous membranes are moist.   Neurological examination:  Orientation: The patient is alert and oriented x3.  She had no problem relating her history. Cranial nerves: There is good facial symmetry with min facial hypomimia. The speech is fluent and clear. Soft palate rises symmetrically and there is no tongue deviation. Hearing is intact to conversational tone. Sensation: Sensation is intact to light touch throughout Motor: Strength is at least antigravity x4.   Movement examination: Tone: There is nl tone in the UE/LE Abnormal movements: there is tremor is LLE>RLE rest tremor Coordination:  There is mild decremation with RAM's Gait and Station: not tested today  I have reviewed and interpreted the following labs independently    Chemistry      Component Value Date/Time   NA 139 05/24/2020 1631   K 4.1 05/24/2020 1631   CL 101 05/24/2020 1631   CO2 26 05/24/2020 1631   BUN 22 05/24/2020 1631    CREATININE 1.37 (H) 05/24/2020 1631      Component Value Date/Time   CALCIUM 9.1 05/24/2020 1631   ALKPHOS 82 11/14/2019 1456   AST 21 11/14/2019 1456   ALT 14 11/14/2019 1456   BILITOT 0.9 11/14/2019 1456       Lab Results  Component Value Date   WBC 5.1 04/11/2020   HGB 12.4 04/11/2020   HCT 37.3 04/11/2020   MCV 91 04/11/2020   PLT 267 02/19/2020    Lab Results  Component Value Date   TSH 1.862 02/18/2020    Total time spent on today's visit was 30 minutes, including both face-to-face time and nonface-to-face time.  Time included that spent on review of records (prior notes available to me/labs/imaging if pertinent), discussing treatment and goals, answering patient's questions and coordinating care.  Cc:  Ginger Organ., MD

## 2020-10-17 ENCOUNTER — Other Ambulatory Visit: Payer: Self-pay

## 2020-10-17 ENCOUNTER — Ambulatory Visit: Payer: Medicare Other | Admitting: Neurology

## 2020-10-17 ENCOUNTER — Encounter: Payer: Self-pay | Admitting: Neurology

## 2020-10-17 VITALS — BP 92/54 | HR 63 | Ht 66.0 in | Wt 157.0 lb

## 2020-10-17 DIAGNOSIS — G232 Striatonigral degeneration: Secondary | ICD-10-CM | POA: Diagnosis not present

## 2020-10-17 NOTE — Patient Instructions (Signed)
The physicians and staff at [facility] are committed to providing excellent care. You may receive a survey requesting feedback about your experience at our office. We strive to receive "very good" responses to the survey questions. If you feel that your experience would prevent you from giving the office a "very good " response, please contact our office to try to remedy the situation. We may be reached at [phone]. Thank you for taking the time out of your busy day to complete the survey.  

## 2020-10-18 ENCOUNTER — Other Ambulatory Visit: Payer: Self-pay | Admitting: Cardiology

## 2020-10-18 DIAGNOSIS — I5032 Chronic diastolic (congestive) heart failure: Secondary | ICD-10-CM

## 2020-10-18 MED ORDER — METOPROLOL SUCCINATE ER 25 MG PO TB24: 12.5000 mg | ORAL_TABLET | Freq: Every day | ORAL | 0 refills | Status: DC

## 2020-10-18 NOTE — Progress Notes (Signed)
Patient had an appointment with her neurologist in the recent past and her office blood pressures were soft.  Patient states that her home systolic blood pressures range between 0000000 mmHg and diastolic blood pressures are usually around 70-80 mmHg.  However given her neurological condition and she is at risk of neurogenic orthostatic hypotension.  Therefore we discussed downtrending the beta-blocker therapy.  She is currently on Toprol-XL 50 mg p.o. daily.  We will transition her down to Toprol-XL 12.5 mg p.o. daily.  New prescription has been sent to the patient's pharmacy with holding parameters.  Patient is more than welcome to call the office if she has any other questions or concerns with the new dose.  Rex Kras, [place], [facility]  Pager: [phone] Office: [phone]

## 2020-10-19 ENCOUNTER — Other Ambulatory Visit: Payer: Self-pay

## 2020-10-19 DIAGNOSIS — I5032 Chronic diastolic (congestive) heart failure: Secondary | ICD-10-CM

## 2020-10-19 MED ORDER — METOPROLOL SUCCINATE ER 25 MG PO TB24: 12.5000 mg | ORAL_TABLET | Freq: Every day | ORAL | 2 refills | Status: DC

## 2020-10-24 ENCOUNTER — Telehealth: Payer: Self-pay

## 2020-10-24 DIAGNOSIS — J3489 Other specified disorders of nose and nasal sinuses: Secondary | ICD-10-CM | POA: Diagnosis not present

## 2020-10-24 DIAGNOSIS — G903 Multi-system degeneration of the autonomic nervous system: Secondary | ICD-10-CM | POA: Diagnosis not present

## 2020-10-24 DIAGNOSIS — N189 Chronic kidney disease, unspecified: Secondary | ICD-10-CM | POA: Diagnosis not present

## 2020-10-24 DIAGNOSIS — E782 Mixed hyperlipidemia: Secondary | ICD-10-CM | POA: Diagnosis not present

## 2020-10-24 DIAGNOSIS — Z86718 Personal history of other venous thrombosis and embolism: Secondary | ICD-10-CM | POA: Diagnosis not present

## 2020-10-24 DIAGNOSIS — H5010 Unspecified exotropia: Secondary | ICD-10-CM | POA: Diagnosis not present

## 2020-10-24 DIAGNOSIS — I252 Old myocardial infarction: Secondary | ICD-10-CM | POA: Diagnosis not present

## 2020-10-24 DIAGNOSIS — Z9181 History of falling: Secondary | ICD-10-CM | POA: Diagnosis not present

## 2020-10-24 DIAGNOSIS — J4520 Mild intermittent asthma, uncomplicated: Secondary | ICD-10-CM | POA: Diagnosis not present

## 2020-10-24 DIAGNOSIS — R32 Unspecified urinary incontinence: Secondary | ICD-10-CM | POA: Diagnosis not present

## 2020-10-24 DIAGNOSIS — I130 Hypertensive heart and chronic kidney disease with heart failure and stage 1 through stage 4 chronic kidney disease, or unspecified chronic kidney disease: Secondary | ICD-10-CM | POA: Diagnosis not present

## 2020-10-24 DIAGNOSIS — I5041 Acute combined systolic (congestive) and diastolic (congestive) heart failure: Secondary | ICD-10-CM | POA: Diagnosis not present

## 2020-10-24 DIAGNOSIS — K219 Gastro-esophageal reflux disease without esophagitis: Secondary | ICD-10-CM | POA: Diagnosis not present

## 2020-10-24 NOTE — Telephone Encounter (Signed)
Received call from [facility] called to get verbal orders. She states the patient is doing well with therapy and she wants the patient to continue therapy. She wants patient to have therapy   2 times a week for 4 more weeks

## 2020-10-24 NOTE — Telephone Encounter (Signed)
ok 

## 2020-10-25 NOTE — Telephone Encounter (Signed)
Voicemail left for [employer], physical therapist. Advised a call back with any questions or concerns.

## 2020-10-26 DIAGNOSIS — E7800 Pure hypercholesterolemia, unspecified: Secondary | ICD-10-CM | POA: Diagnosis not present

## 2020-10-26 DIAGNOSIS — E039 Hypothyroidism, unspecified: Secondary | ICD-10-CM | POA: Diagnosis not present

## 2020-10-26 DIAGNOSIS — M810 Age-related osteoporosis without current pathological fracture: Secondary | ICD-10-CM | POA: Diagnosis not present

## 2020-10-26 DIAGNOSIS — I10 Essential (primary) hypertension: Secondary | ICD-10-CM | POA: Diagnosis not present

## 2020-11-20 ENCOUNTER — Telehealth: Payer: Self-pay

## 2020-11-20 NOTE — Telephone Encounter (Signed)
Received call [facility] stating the patients last two days of therapy will be canceled because the home health aid is on FMLA and will  Be for a while. She states the patients last two appointments will be canceled and she just wanted to make Dr Tat aware.

## 2020-11-21 NOTE — Telephone Encounter (Signed)
Sounds like we need to refer elsewhere.  Do they not have anyone to cover FMLA person?  [facility] has been a challenge for [place].

## 2020-11-21 NOTE — Telephone Encounter (Signed)
Tried to contact patient at 130 11/21/2020. No answer, will try and contact [facility].

## 2020-11-23 NOTE — Telephone Encounter (Signed)
Find out from pt if still needed

## 2020-11-23 NOTE — Telephone Encounter (Signed)
No physical therapy needed at this time per Patient. Fyi only.

## 2020-11-23 NOTE — Telephone Encounter (Signed)
I contacted [facility] effective order discontinued on 11/22/2020. If physical therapy is to be continued can fax new order to physical therapy at [phone].thanks

## 2020-11-25 DIAGNOSIS — I10 Essential (primary) hypertension: Secondary | ICD-10-CM | POA: Diagnosis not present

## 2020-11-25 DIAGNOSIS — E7800 Pure hypercholesterolemia, unspecified: Secondary | ICD-10-CM | POA: Diagnosis not present

## 2020-11-25 DIAGNOSIS — E039 Hypothyroidism, unspecified: Secondary | ICD-10-CM | POA: Diagnosis not present

## 2020-11-25 DIAGNOSIS — M180 Bilateral primary osteoarthritis of first carpometacarpal joints: Secondary | ICD-10-CM | POA: Diagnosis not present

## 2020-11-27 DIAGNOSIS — I21A1 Myocardial infarction type 2: Secondary | ICD-10-CM | POA: Diagnosis not present

## 2020-11-27 DIAGNOSIS — G903 Multi-system degeneration of the autonomic nervous system: Secondary | ICD-10-CM | POA: Diagnosis not present

## 2020-11-27 DIAGNOSIS — G20 Parkinson's disease: Secondary | ICD-10-CM | POA: Diagnosis not present

## 2020-11-27 DIAGNOSIS — I5041 Acute combined systolic (congestive) and diastolic (congestive) heart failure: Secondary | ICD-10-CM | POA: Diagnosis not present

## 2020-11-28 DIAGNOSIS — J40 Bronchitis, not specified as acute or chronic: Secondary | ICD-10-CM | POA: Diagnosis not present

## 2020-11-28 DIAGNOSIS — R0981 Nasal congestion: Secondary | ICD-10-CM | POA: Diagnosis not present

## 2020-11-28 DIAGNOSIS — G250 Essential tremor: Secondary | ICD-10-CM | POA: Diagnosis not present

## 2020-11-28 DIAGNOSIS — G20 Parkinson's disease: Secondary | ICD-10-CM | POA: Diagnosis not present

## 2020-12-04 ENCOUNTER — Encounter: Payer: Self-pay | Admitting: Cardiology

## 2020-12-04 ENCOUNTER — Ambulatory Visit: Payer: Medicare Other | Admitting: Cardiology

## 2020-12-04 ENCOUNTER — Other Ambulatory Visit: Payer: Self-pay

## 2020-12-04 VITALS — BP 104/62 | HR 62 | Temp 98.3°F | Resp 16 | Ht 66.0 in | Wt 166.8 lb

## 2020-12-04 DIAGNOSIS — I130 Hypertensive heart and chronic kidney disease with heart failure and stage 1 through stage 4 chronic kidney disease, or unspecified chronic kidney disease: Secondary | ICD-10-CM

## 2020-12-04 DIAGNOSIS — Z86718 Personal history of other venous thrombosis and embolism: Secondary | ICD-10-CM

## 2020-12-04 DIAGNOSIS — I447 Left bundle-branch block, unspecified: Secondary | ICD-10-CM

## 2020-12-04 DIAGNOSIS — N183 Chronic kidney disease, stage 3 unspecified: Secondary | ICD-10-CM

## 2020-12-04 DIAGNOSIS — I5032 Chronic diastolic (congestive) heart failure: Secondary | ICD-10-CM | POA: Diagnosis not present

## 2020-12-04 DIAGNOSIS — E782 Mixed hyperlipidemia: Secondary | ICD-10-CM | POA: Diagnosis not present

## 2020-12-04 DIAGNOSIS — I428 Other cardiomyopathies: Secondary | ICD-10-CM

## 2020-12-04 NOTE — Progress Notes (Signed)
Rachel [name] Date of Birth: 04/14/1951 MRN: [id number] Primary Care Provider:Shaw, Emily Filbert., MD Primary Cardiologist: Rex Kras, DO, [facility] (established care 03/10/2020)  Date: 12/04/20 Last Office Visit: 06/05/2020  Chief Complaint  Patient presents with  . Chronic heart failure with preserved ejection fraction  . Follow-up    HPI  Rachel [name] is a 70 y.o.  female who presents to the office with a chief complaint of " 6 month follow up heart failure management." Patient's past medical history and cardiovascular risk factors include: Chronic systolic and diastolic heart failure/stage C/NYHA class II, nonischemic cardiomyopathy, multiple system atrophy, asthma, history of left lower extremity DVT, hypertension, hyperlipidemia, hypothyroidism, left bundle branch block, advanced age, postmenopausal female.  Patient is accompanied by her husband at today's office visit.  Patient presented to the hospital back in July 2021 for symptoms of shortness of breath.  She was diagnosed with newly discovered cardiomyopathy and  systolic and diastolic heart failure she was started on guideline directed medical therapy.  Due to moderately reduced LVEF, left bundle branch block, and chest pain she also underwent left heart catheterization and was noted to have normal epicardial coronary arteries.  She was started on GDMT including Entresto and thereafter underwent an echocardiogram which notes preserved LVEF.  She now presents for 58-monthfollow-up.  Since last office visit patient denies any hospitalizations or urgent care visits for cardiovascular symptoms.  She denies any chest pain or anginal equivalent.  She is overall euvolemic and not in congestive heart failure.  Medications are not accurately reconciled as she did not bring in her medication bottles or list with her.  She informs me that since last visit she has been started on both Lasix and potassium supplements.  She will call the office  back with the dosage and frequencies to update her MAR.  She has gained 2 pounds over the last 6 months but this is most likely secondary to caloric intake as opposed to congestive heart failure.   ALLERGIES: Allergies  Allergen Reactions  . Amantadine Hcl     Other reaction(s): Hallucinations  . Azithromycin     Other reaction(s): thrush  . Crestor [Rosuvastatin]     Other reaction(s): confusion  . Ezetimibe     Other reaction(s): disoriented/confusion  . Ezetimibe-Simvastatin     Other reaction(s): myalgias  . Fosamax [Alendronate] Other (See Comments)    headache  . Miacalcin [Calcitonin]     Other reaction(s): headache  . Symbicort [Budesonide-Formoterol Fumarate] Other (See Comments)    Shakes and muscle weakness   MEDICATION LIST PRIOR TO VISIT: Current Outpatient Medications on File Prior to Visit  Medication Sig Dispense Refill  . albuterol (PROVENTIL) (2.5 MG/3ML) 0.083% nebulizer solution Take 3 mLs (2.5 mg total) by nebulization every 6 (six) hours as needed for wheezing or shortness of breath. 120 mL 11  . Carbidopa-Levodopa ER (SINEMET CR) 25-100 MG tablet controlled release 2 tablets in the AM, an increase, and then 5 other dosages in the day 630 tablet 1  . citalopram (CELEXA) 20 MG tablet Take 20 mg by mouth daily.    . DULERA 100-5 MCG/ACT AERO Inhale 2 puffs into the lungs in the morning and at bedtime.    . famotidine (PEPCID) 40 MG tablet Take 1 tablet (40 mg total) by mouth daily. 90 tablet 1  . ipratropium (ATROVENT) 0.06 % nasal spray SPRAY 2 SPRAYS IN EACH NOSTRIL EVERY 6 HOURS AS NEEDED FOR RUNNY NOSE 45 mL 1  . levothyroxine (  SYNTHROID) 112 MCG tablet Take 112 mcg by mouth daily.    Marland Kitchen LINZESS 290 MCG CAPS capsule Take 290 mcg by mouth daily.    . metoprolol succinate (TOPROL XL) 25 MG 24 hr tablet Take 0.5 tablets (12.5 mg total) by mouth daily. Hold if top blood pressure number less than 100 mmHg or pulse less than 60 bpm. 15 tablet 2  . montelukast  (SINGULAIR) 10 MG tablet TAKE 1 TABLET BY MOUTH ONCE DAILY 30 tablet 10  . Multiple Vitamin (MULTIVITAMIN WITH MINERALS) TABS tablet Take 2 tablets by mouth daily.     . pantoprazole (PROTONIX) 40 MG tablet Take 1 tablet (40 mg total) by mouth 2 (two) times daily. 180 tablet 1  . pravastatin (PRAVACHOL) 40 MG tablet Take 1 tablet (40 mg total) by mouth daily at 6 PM. 30 tablet 0  . sacubitril-valsartan (ENTRESTO) 24-26 MG Take 1 tablet by mouth 2 (two) times daily. 180 tablet 0  . vitamin E 100 UNIT capsule Take 200 Units by mouth daily.     . VOLTAREN 1 % GEL Apply 1 application topically 4 (four) times daily as needed (pain).      Current Facility-Administered Medications on File Prior to Visit  Medication Dose Route Frequency Provider Last Rate Last Admin  . mometasone-formoterol (DULERA) 100-5 MCG/ACT inhaler 2 puff  2 puff Inhalation BID Spero Geralds, MD        PAST MEDICAL HISTORY: Past Medical History:  Diagnosis Date  . Allergic rhinitis   . Anemia   . Arthritis   . Asthma   . Blood clot in vein  age 34   left leg  . Depression   . GERD (gastroesophageal reflux disease)   . Hyperlipidemia   . Hypertension    pt denies, in physician record  . Hypothyroidism   . Kidney stones   . Multiple pregnancy loss, not currently pregnant    multiple miscarriages-pt.unsure of #  . Multiple system atrophy ([place]) 2015  . Nephrolithiasis    hx of kidney stones, last 6 months ago  . Osteopenia   . Osteoporosis   . Parkinsonism ([place])   . Tendinitis    left foot  . Vitamin D deficiency     PAST SURGICAL HISTORY: Past Surgical History:  Procedure Laterality Date  . BREAST EXCISIONAL BIOPSY Right   . BREAST SURGERY     rt. lumpectomy, benign  . colonscopy  2011  . cystoscopy  2012  . JOINT REPLACEMENT  05-2007   right knee replacment/revision  . KIDNEY STONE SURGERY    . LEFT HEART CATH AND CORONARY ANGIOGRAPHY N/A 02/21/2020   Procedure: LEFT HEART CATH AND CORONARY  ANGIOGRAPHY;  Surgeon: Adrian Prows, MD;  Location: [facility];  Service: Cardiovascular;  Laterality: N/A;  . REPLACEMENT TOTAL KNEE Left 06/05/12  . TOTAL KNEE REVISION  06/03/2012   Procedure: TOTAL KNEE REVISION;  Surgeon: Gearlean Alf, MD;  Location: [facility];  Service: Orthopedics;  Laterality: Left;  Revision of a Left Uni Knee to a Total Knee Arthroplasty    FAMILY HISTORY: The patient's family history includes Asthma in her sister; Breast cancer in her sister; Diabetes in her mother; Heart attack in her brother; Heart failure in her father, mother, and sister; Thyroid disease in her mother.   SOCIAL HISTORY:  The patient  reports that she has never smoked. She has never used smokeless tobacco. She reports that she does not drink alcohol and does not use drugs.  Review of Systems  Constitutional: Negative for chills and fever.  HENT: Negative for hoarse voice and nosebleeds.   Eyes: Negative for discharge, double vision and pain.  Cardiovascular: Negative for chest pain, claudication, dyspnea on exertion, leg swelling, near-syncope, orthopnea, palpitations, paroxysmal nocturnal dyspnea and syncope.  Respiratory: Positive for shortness of breath (at baseline). Negative for hemoptysis.   Musculoskeletal: Negative for muscle cramps and myalgias.  Gastrointestinal: Negative for abdominal pain, constipation, diarrhea, hematemesis, hematochezia, melena, nausea and vomiting.    PHYSICAL EXAM: Vitals with BMI 12/04/2020 10/17/2020 08/17/2020  Height '5\' 6"'$  '5\' 6"'$  '5\' 6"'$   Weight 166 lbs 13 oz 157 lbs 149 lbs 3 oz  BMI 26.94 [id number] 0000000  Systolic [id number] 92 [id number]  Diastolic 62 54 60  Pulse 62 63 58    CONSTITUTIONAL: Age-appropriate female, hemodynamically stable, well-nourished. No acute distress.  SKIN: Skin is warm and dry. No rash noted. No cyanosis. No pallor. No jaundice HEAD: Normocephalic and atraumatic.  EYES: No scleral icterus MOUTH/THROAT: Moist oral membranes.  NECK: No JVD  present. No thyromegaly noted. No carotid bruits  LYMPHATIC: No visible cervical adenopathy.  CHEST Normal respiratory effort. No intercostal retractions  LUNGS: Clear to auscultation bilaterally no stridor. No wheezes. No rales.  CARDIOVASCULAR: Regular, positive S1-S2, no murmurs rubs or gallops appreciated. ABDOMINAL: Nonobese, soft, nontender, nondistended, positive bowel sounds in all 4 quadrants no apparent ascites.  EXTREMITIES: No pitting edema. HEMATOLOGIC: No significant bruising NEUROLOGIC: Oriented to person, place, and time. Nonfocal. Normal muscle tone.  PSYCHIATRIC: Normal mood and affect. Normal behavior. Cooperative  CARDIAC DATABASE: EKG: 11/29/2020: Sinus bradycardia, 56 bpm, normal axis, old anteroseptal infarct, nonspecific ST-T changes.    Echocardiogram: 05/26/2020:  Normal LV systolic function with visual EF 50-55%. Left ventricle cavity is normal in size. Normal global wall motion. Doppler evidence of grade I (impaired) diastolic dysfunction, normal LAP.  Left atrial cavity is mildly dilated.  Mild (Grade I) mitral regurgitation.  Mild to moderate tricuspid regurgitation. No evidence of pulmonary  hypertension. RVSP measures 33 mmHg.  Mild pulmonic regurgitation.  IVC is dilated with a respiratory response of >50%.  Compared to prior study dated 02/17/2020 LVEF has improved from 30-35% to 50-55%, Now grade I diastolic dysfunction, otherwise no other significant change.  Stress Testing:  NA  Heart Catheterization: Left Heart Catheterization 02/21/20:  There is severe left ventricular systolic dysfunction. The left ventricular ejection fraction is less than 25% by visual estimate. LV end diastolic pressure is normal. Normal coronary arteries, right dominant circulation.  LABORATORY DATA: CBC Latest Ref Rng & Units 04/11/2020 02/19/2020 02/18/2020  WBC 3.4 - 10.8 x10E3/uL 5.1 7.5 7.7  Hemoglobin 11.1 - 15.9 g/dL 12.4 11.5(L) 10.5(L)  Hematocrit 34.0 - 46.6 % 37.3  35.4(L) 33.6(L)  Platelets 150 - 400 K/uL - 267 245    CMP Latest Ref Rng & Units 05/24/2020 04/27/2020 04/17/2020  Glucose 65 - 99 mg/dL 84 93 92  BUN 8 - 27 mg/dL '22 20 23  '$ Creatinine 0.57 - 1.00 mg/dL 1.37(H) 1.65(H) 1.39(H)  Sodium 134 - 144 mmol/L 139 137 139  Potassium 3.5 - 5.2 mmol/L 4.1 4.3 4.3  Chloride 96 - 106 mmol/L 101 99 99  CO2 20 - 29 mmol/L '26 25 27  '$ Calcium 8.7 - 10.3 mg/dL 9.1 9.8 9.8  Total Protein 6.5 - 8.1 g/dL - - -  Total Bilirubin 0.3 - 1.2 mg/dL - - -  Alkaline Phos 38 - 126 U/L - - -  AST 15 - 41 U/L - - -  ALT 0 - 44 U/L - - -   Lipid Panel     Component Value Date/Time   CHOL 236 (H) 02/18/2020 0602   TRIG 47 02/18/2020 0602   HDL 65 02/18/2020 0602   CHOLHDL 3.6 02/18/2020 0602   VLDL 9 02/18/2020 0602   LDLCALC 162 (H) 02/18/2020 0602    No results found for: HGBA1C No components found for: NTPROBNP Lab Results  Component Value Date   TSH 1.862 02/18/2020    Cardiac Panel (last 3 results) No results for input(s): CKTOTAL, CKMB, TROPONINIHS, RELINDX in the last 72 hours.  IMPRESSION:    ICD-10-CM   1. Chronic heart failure with preserved ejection fraction (HFpEF) (HCC)  I50.32 EKG 12-Lead  2. Recovered nonischemic cardiomyopathy ([place])  I42.8   3. Mixed hyperlipidemia  E78.2   4. Hypertensive heart and kidney disease with HF and with CKD stage III (HCC)  I13.0    N18.30   5. LBBB (left bundle branch block)  I44.7   6. Hx of deep venous thrombosis  Z86.718      RECOMMENDATIONS: Beanca Bequette is a 70 y.o. female whose past medical history and cardiovascular risk factors include: Chronic systolic and diastolic heart failure/stage C/NYHA class II, nonischemic cardiomyopathy, multiple system atrophy, asthma, history of left lower extremity DVT, hypertension, hyperlipidemia, hypothyroidism, left bundle branch block, advanced age, postmenopausal female.  Chronic heart failure preserved EF, stage C, NYHA class II:  Last heart failure  hospitalization: July 2021.  Patient remains euvolemic.  Currently is taking the Lasix and potassium supplements that were prescribed by her primary care provider.  She does not recall the dosage or the frequency at this time.  Recommended that next time we could go up on the Entresto to 49/51 mg p.o. twice daily and discontinue Lasix.  They do not want to make any changes during today's office visit as the medications are either prepackaged or she gets a 90-day supply for her medications.  She also had blood work done at Dr. Raul Del office which she will provide [place] a copy for our reference.  Recovered nonischemic cardiomyopathy: See above  Hyperlipidemia:  Started on pravastatin on 02/18/2020.  Patient tolerated the medication well without any side effects or intolerances.   Most recent lipid profile performed by her PCP.  We will request a copy for our records.  Left bundle branch block: Continue to monitor.  FINAL MEDICATION LIST END OF ENCOUNTER: No orders of the defined types were placed in this encounter.   Medications Discontinued During This Encounter  Medication Reason  . NON FORMULARY Error     Current Outpatient Medications:  .  albuterol (PROVENTIL) (2.5 MG/3ML) 0.083% nebulizer solution, Take 3 mLs (2.5 mg total) by nebulization every 6 (six) hours as needed for wheezing or shortness of breath., Disp: 120 mL, Rfl: 11 .  Carbidopa-Levodopa ER (SINEMET CR) 25-100 MG tablet controlled release, 2 tablets in the AM, an increase, and then 5 other dosages in the day, Disp: 630 tablet, Rfl: 1 .  citalopram (CELEXA) 20 MG tablet, Take 20 mg by mouth daily., Disp: , Rfl:  .  DULERA 100-5 MCG/ACT AERO, Inhale 2 puffs into the lungs in the morning and at bedtime., Disp: , Rfl:  .  famotidine (PEPCID) 40 MG tablet, Take 1 tablet (40 mg total) by mouth daily., Disp: 90 tablet, Rfl: 1 .  ipratropium (ATROVENT) 0.06 % nasal spray, SPRAY 2 SPRAYS IN EACH NOSTRIL EVERY 6 HOURS AS NEEDED  FOR RUNNY NOSE,  Disp: 45 mL, Rfl: 1 .  levothyroxine (SYNTHROID) 112 MCG tablet, Take 112 mcg by mouth daily., Disp: , Rfl:  .  LINZESS 290 MCG CAPS capsule, Take 290 mcg by mouth daily., Disp: , Rfl:  .  metoprolol succinate (TOPROL XL) 25 MG 24 hr tablet, Take 0.5 tablets (12.5 mg total) by mouth daily. Hold if top blood pressure number less than 100 mmHg or pulse less than 60 bpm., Disp: 15 tablet, Rfl: 2 .  montelukast (SINGULAIR) 10 MG tablet, TAKE 1 TABLET BY MOUTH ONCE DAILY, Disp: 30 tablet, Rfl: 10 .  Multiple Vitamin (MULTIVITAMIN WITH MINERALS) TABS tablet, Take 2 tablets by mouth daily. , Disp: , Rfl:  .  pantoprazole (PROTONIX) 40 MG tablet, Take 1 tablet (40 mg total) by mouth 2 (two) times daily., Disp: 180 tablet, Rfl: 1 .  pravastatin (PRAVACHOL) 40 MG tablet, Take 1 tablet (40 mg total) by mouth daily at 6 PM., Disp: 30 tablet, Rfl: 0 .  sacubitril-valsartan (ENTRESTO) 24-26 MG, Take 1 tablet by mouth 2 (two) times daily., Disp: 180 tablet, Rfl: 0 .  vitamin E 100 UNIT capsule, Take 200 Units by mouth daily. , Disp: , Rfl:  .  VOLTAREN 1 % GEL, Apply 1 application topically 4 (four) times daily as needed (pain). , Disp: , Rfl:   Current Facility-Administered Medications:  .  mometasone-formoterol (DULERA) 100-5 MCG/ACT inhaler 2 puff, 2 puff, Inhalation, BID, Spero Geralds, MD  Orders Placed This Encounter  Procedures  . EKG 12-Lead   --Continue cardiac medications as reconciled in final medication list. --Return in about 6 months (around 06/06/2021) for Follow up, heart failure management.. Or sooner if needed. --Continue follow-up with your primary care physician regarding the management of your other chronic comorbid conditions.  Patient's questions and concerns were addressed to her satisfaction. She voices understanding of the instructions provided during this encounter.   This note was created using a voice recognition software as a result there may be grammatical  errors inadvertently enclosed that do not reflect the nature of this encounter. Every attempt is made to correct such errors.  Rex Kras, [place], [facility]  Pager: [phone] Office: [phone]

## 2020-12-06 ENCOUNTER — Other Ambulatory Visit: Payer: Self-pay | Admitting: Neurology

## 2020-12-08 DIAGNOSIS — H524 Presbyopia: Secondary | ICD-10-CM | POA: Diagnosis not present

## 2020-12-13 DIAGNOSIS — R1031 Right lower quadrant pain: Secondary | ICD-10-CM | POA: Diagnosis not present

## 2020-12-20 ENCOUNTER — Other Ambulatory Visit: Payer: Self-pay | Admitting: Neurology

## 2020-12-26 ENCOUNTER — Other Ambulatory Visit: Payer: Self-pay | Admitting: Neurology

## 2020-12-26 DIAGNOSIS — E039 Hypothyroidism, unspecified: Secondary | ICD-10-CM | POA: Diagnosis not present

## 2020-12-26 DIAGNOSIS — I10 Essential (primary) hypertension: Secondary | ICD-10-CM | POA: Diagnosis not present

## 2020-12-26 DIAGNOSIS — G20 Parkinson's disease: Secondary | ICD-10-CM | POA: Diagnosis not present

## 2020-12-26 DIAGNOSIS — E785 Hyperlipidemia, unspecified: Secondary | ICD-10-CM | POA: Diagnosis not present

## 2020-12-26 NOTE — Telephone Encounter (Signed)
Patient's pharmacy called to check on the status of the refill.

## 2020-12-27 ENCOUNTER — Other Ambulatory Visit: Payer: Self-pay | Admitting: Neurology

## 2020-12-27 ENCOUNTER — Telehealth: Payer: Self-pay | Admitting: Neurology

## 2020-12-28 ENCOUNTER — Other Ambulatory Visit: Payer: Self-pay | Admitting: Neurology

## 2020-12-28 ENCOUNTER — Other Ambulatory Visit: Payer: Self-pay

## 2020-12-28 ENCOUNTER — Telehealth: Payer: Self-pay | Admitting: Neurology

## 2020-12-28 NOTE — Telephone Encounter (Signed)
Pharmacy called again for refill status Carbidopa-levodopa ER

## 2020-12-28 NOTE — Telephone Encounter (Signed)
Patient is to check at her pharmacy, looks like it was received at pharmacy

## 2020-12-29 DIAGNOSIS — M7542 Impingement syndrome of left shoulder: Secondary | ICD-10-CM | POA: Diagnosis not present

## 2021-01-01 ENCOUNTER — Other Ambulatory Visit: Payer: Self-pay | Admitting: Allergy and Immunology

## 2021-01-03 ENCOUNTER — Other Ambulatory Visit: Payer: Self-pay | Admitting: Allergy and Immunology

## 2021-01-08 ENCOUNTER — Emergency Department (HOSPITAL_BASED_OUTPATIENT_CLINIC_OR_DEPARTMENT_OTHER)
Admission: EM | Admit: 2021-01-08 | Discharge: 2021-01-08 | Disposition: A | Discharge status: Home / Self Care | Payer: Medicare Other | Attending: Emergency Medicine | Admitting: Emergency Medicine

## 2021-01-08 ENCOUNTER — Encounter (HOSPITAL_BASED_OUTPATIENT_CLINIC_OR_DEPARTMENT_OTHER): Payer: Self-pay | Admitting: *Deleted

## 2021-01-08 ENCOUNTER — Other Ambulatory Visit: Payer: Self-pay

## 2021-01-08 ENCOUNTER — Emergency Department (HOSPITAL_BASED_OUTPATIENT_CLINIC_OR_DEPARTMENT_OTHER): Payer: Medicare Other | Admitting: Radiology

## 2021-01-08 DIAGNOSIS — R079 Chest pain, unspecified: Secondary | ICD-10-CM | POA: Diagnosis not present

## 2021-01-08 DIAGNOSIS — R059 Cough, unspecified: Secondary | ICD-10-CM | POA: Diagnosis not present

## 2021-01-08 DIAGNOSIS — E039 Hypothyroidism, unspecified: Secondary | ICD-10-CM | POA: Diagnosis not present

## 2021-01-08 DIAGNOSIS — R0789 Other chest pain: Secondary | ICD-10-CM | POA: Insufficient documentation

## 2021-01-08 DIAGNOSIS — N184 Chronic kidney disease, stage 4 (severe): Secondary | ICD-10-CM | POA: Diagnosis not present

## 2021-01-08 DIAGNOSIS — I5041 Acute combined systolic (congestive) and diastolic (congestive) heart failure: Secondary | ICD-10-CM | POA: Diagnosis not present

## 2021-01-08 DIAGNOSIS — Z96652 Presence of left artificial knee joint: Secondary | ICD-10-CM | POA: Diagnosis not present

## 2021-01-08 DIAGNOSIS — Z79899 Other long term (current) drug therapy: Secondary | ICD-10-CM | POA: Diagnosis not present

## 2021-01-08 DIAGNOSIS — J4531 Mild persistent asthma with (acute) exacerbation: Secondary | ICD-10-CM | POA: Insufficient documentation

## 2021-01-08 DIAGNOSIS — R0602 Shortness of breath: Secondary | ICD-10-CM | POA: Insufficient documentation

## 2021-01-08 DIAGNOSIS — Z7951 Long term (current) use of inhaled steroids: Secondary | ICD-10-CM | POA: Diagnosis not present

## 2021-01-08 DIAGNOSIS — I130 Hypertensive heart and chronic kidney disease with heart failure and stage 1 through stage 4 chronic kidney disease, or unspecified chronic kidney disease: Secondary | ICD-10-CM | POA: Diagnosis not present

## 2021-01-08 LAB — BASIC METABOLIC PANEL
Anion gap: 9 (ref 5–15)
BUN: 45 mg/dL — ABNORMAL HIGH (ref 8–23)
CO2: 25 mmol/L (ref 22–32)
Calcium: 8.7 mg/dL — ABNORMAL LOW (ref 8.9–10.3)
Chloride: 104 mmol/L (ref 98–111)
Creatinine, Ser: 1.69 mg/dL — ABNORMAL HIGH (ref 0.44–1.00)
GFR, Estimated: 32 mL/min — ABNORMAL LOW (ref >60)
Glucose, Bld: 94 mg/dL (ref 70–99)
Potassium: 4.4 mmol/L (ref 3.5–5.1)
Sodium: 138 mmol/L (ref 135–145)

## 2021-01-08 LAB — TROPONIN I (HIGH SENSITIVITY)
Troponin I (High Sensitivity): 3 ng/L (ref <18)
Troponin I (High Sensitivity): 3 ng/L (ref <18)

## 2021-01-08 LAB — CBC
HCT: 37.1 % (ref 36.0–46.0)
Hemoglobin: 12 g/dL (ref 12.0–15.0)
MCH: 29.8 pg (ref 26.0–34.0)
MCHC: 32.3 g/dL (ref 30.0–36.0)
MCV: 92.1 fL (ref 80.0–100.0)
Platelets: 355 10*3/uL (ref 150–400)
RBC: 4.03 MIL/uL (ref 3.87–5.11)
RDW: 12.8 % (ref 11.5–15.5)
WBC: 7.4 10*3/uL (ref 4.0–10.5)
nRBC: 0 % (ref 0.0–0.2)

## 2021-01-08 LAB — HEPATIC FUNCTION PANEL
ALT: 5 U/L (ref 0–44)
AST: 16 U/L (ref 15–41)
Albumin: 4.1 g/dL (ref 3.5–5.0)
Alkaline Phosphatase: 83 U/L (ref 38–126)
Bilirubin, Direct: 0.1 mg/dL (ref 0.0–0.2)
Indirect Bilirubin: 0.3 mg/dL (ref 0.3–0.9)
Total Bilirubin: 0.4 mg/dL (ref 0.3–1.2)
Total Protein: 6.7 g/dL (ref 6.5–8.1)

## 2021-01-08 LAB — LIPASE, BLOOD: Lipase: 25 U/L (ref 11–51)

## 2021-01-08 MED ORDER — FAMOTIDINE IN NACL 20-0.9 MG/50ML-% IV SOLN
20.0000 mg | Freq: Once | INTRAVENOUS | Status: AC
  Administered 2021-01-08: 20 mg via INTRAVENOUS
  Filled 2021-01-08: qty 50

## 2021-01-08 MED ORDER — HYDROCODONE-ACETAMINOPHEN 5-325 MG PO TABS
1.0000 | ORAL_TABLET | Freq: Once | ORAL | Status: AC
  Administered 2021-01-08: 1 via ORAL
  Filled 2021-01-08: qty 1

## 2021-01-08 MED ORDER — HYDROCODONE-ACETAMINOPHEN 5-325 MG PO TABS: 1.0000 | ORAL_TABLET | Freq: Four times a day (QID) | ORAL | 0 refills | Status: DC | PRN

## 2021-01-08 NOTE — ED Triage Notes (Signed)
Chest pain developed this morning. Pain in mid chest to under rt arm with some pain in her back at intervals

## 2021-01-08 NOTE — ED Provider Notes (Signed)
[facility] EMERGENCY DEPT Provider Note   CSN: [id number] Arrival date & time: 01/08/21  1645     History Chief Complaint  Patient presents with   Chest Pain    Rachel [name] is a 70 y.o. female.  HPI Patient reports she had 2 episodes of the chemicals that seem to trigger the symptoms.  On Friday, she and her husband were looking at a new apartment complex and they had just recently done a aerosolized flea treatment.  She reports it caused her to have a lot of coughing and shortness of breath.  She left immediately and did some treatments at home.  She was doing better but then on Sunday when they were out to eat, the staff sprayed multiple tables with aerosolized cleaning solution which again triggered a significant response.patient reports that she has been doing a lot of her albuterol nebulizer solution which really has helped with shortness of breath.  Patient has been doing some lifting.  She and her husband are moving.  She reports she is done very little though because she has not felt well.  She reports however she today is getting chest pains.  They have been migratory around the center of her chest.  It is sharp and uncomfortable.  Sometimes worse with movements.  No fever or cough.  No lower extremity swelling or calf pain.    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   Nephrolithiasis    hx of kidney stones, last 6 months ago   Osteopenia    Osteoporosis    Parkinsonism ([place])    Tendinitis    left foot   Vitamin D deficiency     Patient Active Problem List   Diagnosis  Date Noted   Prolonged Q-T interval on ECG    Class 1 obesity due to excess calories with serious comorbidity and body mass index (BMI) of 34.0 to 34.9 in adult    Community acquired pneumonia of right lower lobe of lung 02/17/2020   Mild intermittent asthma with (acute) exacerbation 02/17/2020   Acute respiratory failure with hypoxia ([place]) 02/17/2020   Acute combined systolic and diastolic heart failure (HCC)    Cardiomyopathy ([place])    Type 2 myocardial infarction (HCC)    Elevated troponin    Sinus tachycardia    LBBB (left bundle branch block)    Shortness of breath    Hypertensive heart and kidney disease with HF and with CKD stage I-IV ([place])    Mixed hyperlipidemia    Hx of deep venous thrombosis    Multiple system atrophy, 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 lumpectomy, benign   colonscopy  2011   cystoscopy  2012   JOINT REPLACEMENT  05-2007   right knee replacment/revision   KIDNEY STONE SURGERY     LEFT HEART CATH AND CORONARY ANGIOGRAPHY N/A 02/21/2020   Procedure: LEFT HEART CATH AND CORONARY ANGIOGRAPHY;  Surgeon: Adrian Prows, MD;  Location: [facility];  Service: Cardiovascular;  Laterality: N/A;   REPLACEMENT TOTAL KNEE Left 06/05/12   TOTAL KNEE REVISION  06/03/2012   Procedure: TOTAL KNEE REVISION;  Surgeon: Gearlean Alf, MD;  Location: [facility];  Service: Orthopedics;  Laterality: Left;  Revision of a Left Uni Knee to a Total Knee Arthroplasty     OB History   No obstetric history on file.     Obstetric Comments  Multiple miscarriages.  1 adopted child.         Family History  Problem Relation Age of Onset   Diabetes Mother    Heart failure Mother        CHF   Thyroid disease Mother    Heart failure Father        CHF   Heart attack Brother    Breast cancer Sister    Asthma Sister    Heart failure Sister     Social History   Tobacco Use   Smoking status: Never   Smokeless tobacco: Never  Vaping Use   Vaping Use: Never used  Substance Use Topics   Alcohol use: No    Alcohol/week: 0.0 standard drinks   Drug use: No    Home Medications Prior to Admission medications   Medication Sig Start Date End Date Taking? Authorizing Provider  albuterol (PROVENTIL) (2.5 MG/3ML) 0.083% nebulizer solution Take 3 mLs (2.5 mg total) by nebulization every 6 (six) hours as needed for wheezing or shortness of breath. 02/07/20  Yes Magdalen Spatz, NP  Carbidopa-Levodopa ER (SINEMET CR) 25-100 MG tablet controlled release TAKE 2 TABLETS BEFORE BREAKFAST;TAKE 1 TABLET BY MOUTH EVERY MORNING WITH BREAKFAST;TAKE 1 TAB BY MOUTH DAILY AT NOON;TAKE 1 TABLET BY MOUTH EVERY EVENING AT [facility] 2 TABLETS BY MOUTH DAILY AT BEDTIME 12/20/20  Yes Tat, Eustace Quail, DO  citalopram (CELEXA) 20 MG tablet Take 20 mg by mouth daily. 12/13/19  Yes [provider]  DULERA 100-5 MCG/ACT AERO Inhale 2 puffs into the lungs in the morning and at bedtime. 03/16/20  Yes [provider]  famotidine (PEPCID) 40 MG tablet Take 1 tablet (40 mg total) by mouth daily. 06/21/20  Yes Kozlow, Donnamarie Poag, MD  HYDROcodone-acetaminophen (NORCO/VICODIN) 5-325 MG tablet Take 1-2 tablets by mouth every 6 (six) hours as needed for moderate pain or severe pain. 01/08/21  Yes Syed Zukas, Jeannie Done, MD  ipratropium (ATROVENT) 0.06 % nasal spray SPRAY 2 SPRAYS IN EACH NOSTRIL EVERY 6 HOURS AS NEEDED FOR RUNNY NOSE 06/21/20  Yes Kozlow, Donnamarie Poag, MD  levothyroxine (SYNTHROID) 112 MCG tablet Take 112 mcg by mouth daily. 11/09/19   Yes [provider]  LINZESS 290 MCG CAPS capsule Take 290 mcg by mouth daily. 10/20/19  Yes [provider]  metoprolol succinate (TOPROL XL) 25 MG 24 hr tablet Take 0.5 tablets (12.5 mg total) by mouth daily. Hold if top blood pressure number less than 100 mmHg or pulse less than 60 bpm. 10/19/20 01/17/21 Yes Tolia, Sunit, DO  montelukast (SINGULAIR) 10 MG tablet TAKE 1  TABLET BY MOUTH ONCE DAILY 06/23/20  Yes Spero Geralds, MD  pantoprazole (PROTONIX) 40 MG tablet Take 1 tablet (40 mg total) by mouth 2 (two) times daily. 06/21/20  Yes Kozlow, Donnamarie Poag, MD  pravastatin (PRAVACHOL) 40 MG tablet Take 1 tablet (40 mg total) by mouth daily at 6 PM. 02/21/20  Yes Amin, Ankit Chirag, MD  sacubitril-valsartan (ENTRESTO) 24-26 MG Take 1 tablet by mouth 2 (two) times daily. 08/18/20  Yes Tolia, Sunit, DO  vitamin E 100 UNIT capsule Take 200 Units by mouth daily.    Yes [provider]  VOLTAREN 1 % GEL Apply 1 application topically 4 (four) times daily as needed (pain).  01/25/14  Yes [provider]  Multiple Vitamin (MULTIVITAMIN WITH MINERALS) TABS tablet Take 2 tablets by mouth daily.     [provider]    Allergies    Amantadine hcl, Azithromycin, Crestor [rosuvastatin], Ezetimibe, Ezetimibe-simvastatin, Fosamax [alendronate], Miacalcin [calcitonin], and Symbicort [budesonide-formoterol fumarate]  Review of Systems   Review of Systems 10 systems reviewed and negative except as per HPI Physical Exam Updated Vital Signs BP 121/88   Pulse (!) 57   Temp 98.4 F (36.9 C)   Resp 16   Ht '5\' 6"'$  (1.676 m)   Wt 74.4 kg   SpO2 99%   BMI 26.47 kg/m   Physical Exam Constitutional:      Comments: Alert nontoxic.  No respiratory distress at rest.  HENT:     Head: Normocephalic and atraumatic.     Mouth/Throat:     Pharynx: Oropharynx is clear.  Eyes:     Extraocular Movements: Extraocular movements intact.  Cardiovascular:     Rate and Rhythm: Normal  rate and regular rhythm.     Comments: Mildly reproducible chest wall pain. Pulmonary:     Effort: Pulmonary effort is normal.     Breath sounds: Normal breath sounds.  Abdominal:     General: There is no distension.     Palpations: Abdomen is soft.     Tenderness: There is no abdominal tenderness. There is no guarding.  Musculoskeletal:        General: No swelling or tenderness.     Right lower leg: No edema.     Left lower leg: No edema.  Skin:    General: Skin is warm and dry.  Neurological:     Comments: Patient is alert.  She does have a tremor.  She reports this is baseline due to neurologic dysfunction.  No focal motor deficits.    ED Results / Procedures / Treatments   Labs (all labs ordered are listed, but only abnormal results are displayed) Labs Reviewed  BASIC METABOLIC PANEL - Abnormal; Notable for the following components:      Result Value   BUN 45 (*)    Creatinine, Ser 1.69 (*)    Calcium 8.7 (*)    GFR, Estimated 32 (*)    All other components within normal limits  CBC  LIPASE, BLOOD  HEPATIC FUNCTION PANEL  TROPONIN I (HIGH SENSITIVITY)  TROPONIN I (HIGH SENSITIVITY)    EKG EKG Interpretation  Date/Time:  Monday January 08 2021 16:54:22 EDT Ventricular Rate:  67 PR Interval:  146 QRS Duration: 72 QT Interval:  410 QTC Calculation: 433 R Axis:   64 Text Interpretation: Normal sinus rhythm with sinus arrhythmia Anterior infarct , age undetermined Abnormal ECG no acute ischemic appearance, resolution of prior ischemic appearing T wave inversion Confirmed by Charlesetta Shanks [phone]) on  01/08/2021 8:01:58 PM  Radiology DG Chest 2 View  Result Date: 01/08/2021 CLINICAL DATA:  Chest pain EXAM: CHEST - 2 VIEW COMPARISON:  02/17/2020 FINDINGS: The heart size and mediastinal contours are within normal limits. Atherosclerotic calcification of the aortic knob. No focal airspace consolidation, pleural effusion, or pneumothorax. The visualized skeletal structures  are unremarkable. IMPRESSION: No active cardiopulmonary disease. Electronically Signed   By: Davina Poke D.O.   On: 01/08/2021 17:44    Procedures Procedures   Medications Ordered in ED Medications  famotidine (PEPCID) IVPB 20 mg premix (0 mg Intravenous Stopped 01/08/21 2149)  HYDROcodone-acetaminophen (NORCO/VICODIN) 5-325 MG per tablet 1 tablet (1 tablet Oral Given 01/08/21 2114)    ED Course  I have reviewed the triage vital signs and the nursing notes.  Pertinent labs & imaging results that were available during my care of the patient were reviewed by me and considered in my medical decision making (see chart for details).    MDM Rules/Calculators/A&P                          Patient presents as outlined.  She had a couple of exposures to aerosolized chemicals that seem to have triggered asthmatic response.  She has treated that at home with nebulizer treatments.  She reports that did help the shortness of breath.  She is not sure if that is what triggered the chest pain that she experienced today.  This chest pain is migratory around the center of her chest.  No other associated symptoms.  At this time troponins are negative and EKG does not show ischemic changes.  Description atypical for PE and patient does not have hypoxia or tachycardia.  May be musculoskeletal chest wall pain with recent moving and additional coughing after exposure to chemical triggers.  Currently with stable vital signs and no respiratory distress plan is for discharge.  Patient will be given some Vicodin to take for chest wall discomfort.  Careful return precautions reviewed. Final Clinical Impression(s) / ED Diagnoses Final diagnoses:  Chest pain, unspecified type    Rx / DC Orders ED Discharge Orders          Ordered    HYDROcodone-acetaminophen (NORCO/VICODIN) 5-325 MG tablet  Every 6 hours PRN        01/08/21 2145             Charlesetta Shanks, MD 01/08/21 2152

## 2021-01-08 NOTE — Discharge Instructions (Addendum)
1.  At this time your chest x-ray does not show evidence of pneumonia or other immediate abnormality in your EKG and labs did not show evidence of a heart attack.  At this time, the exact cause of your pain is unknown.  It does not appear to be due to a emergent medical condition.  Take Vicodin 1 to 2 tablets every 6 hours as prescribed to help with pain.  You are already taking 2 medications for reflux, continue these. 2.  See your doctor for recheck within the next 2 to 3 days.  Return to the emergency department immediately if your pain is worsening or getting new additional symptoms or other concerning changes.

## 2021-01-09 ENCOUNTER — Telehealth: Payer: Self-pay | Admitting: Neurology

## 2021-01-09 NOTE — Telephone Encounter (Signed)
Rachel [name] called in and wants a call back from doctor regarding her hospital visit 01/08/21. She was having extreme pain, and would like the doctor to read over results.

## 2021-01-09 NOTE — Telephone Encounter (Signed)
Pt called an advised.

## 2021-01-09 NOTE — Telephone Encounter (Signed)
Reviewed records.  Looks more like a medical issue than neuro issue but i'm sorry she went through that.  Make sure she f/u with PCP

## 2021-01-10 ENCOUNTER — Other Ambulatory Visit: Payer: Self-pay | Admitting: Allergy and Immunology

## 2021-01-16 ENCOUNTER — Other Ambulatory Visit: Payer: Self-pay | Admitting: Allergy and Immunology

## 2021-01-19 ENCOUNTER — Ambulatory Visit: Payer: Medicare Other | Admitting: Neurology

## 2021-01-22 ENCOUNTER — Other Ambulatory Visit: Payer: Self-pay | Admitting: Allergy and Immunology

## 2021-01-23 DIAGNOSIS — M25512 Pain in left shoulder: Secondary | ICD-10-CM | POA: Diagnosis not present

## 2021-02-09 ENCOUNTER — Other Ambulatory Visit: Payer: Self-pay | Admitting: Allergy and Immunology

## 2021-03-01 DIAGNOSIS — M25562 Pain in left knee: Secondary | ICD-10-CM | POA: Diagnosis not present

## 2021-03-01 DIAGNOSIS — Z96652 Presence of left artificial knee joint: Secondary | ICD-10-CM | POA: Diagnosis not present

## 2021-03-19 ENCOUNTER — Encounter: Payer: Self-pay | Admitting: Cardiology

## 2021-03-19 ENCOUNTER — Ambulatory Visit: Payer: Medicare Other | Admitting: Cardiology

## 2021-03-19 ENCOUNTER — Other Ambulatory Visit: Payer: Self-pay

## 2021-03-19 VITALS — BP 129/66 | HR 68 | Resp 16 | Ht 66.0 in | Wt 164.0 lb

## 2021-03-19 DIAGNOSIS — E782 Mixed hyperlipidemia: Secondary | ICD-10-CM

## 2021-03-19 DIAGNOSIS — I447 Left bundle-branch block, unspecified: Secondary | ICD-10-CM | POA: Diagnosis not present

## 2021-03-19 DIAGNOSIS — R072 Precordial pain: Secondary | ICD-10-CM | POA: Diagnosis not present

## 2021-03-19 DIAGNOSIS — N183 Chronic kidney disease, stage 3 unspecified: Secondary | ICD-10-CM

## 2021-03-19 DIAGNOSIS — I130 Hypertensive heart and chronic kidney disease with heart failure and stage 1 through stage 4 chronic kidney disease, or unspecified chronic kidney disease: Secondary | ICD-10-CM

## 2021-03-19 DIAGNOSIS — I428 Other cardiomyopathies: Secondary | ICD-10-CM

## 2021-03-19 DIAGNOSIS — I5032 Chronic diastolic (congestive) heart failure: Secondary | ICD-10-CM

## 2021-03-19 DIAGNOSIS — Z86718 Personal history of other venous thrombosis and embolism: Secondary | ICD-10-CM

## 2021-03-19 NOTE — Progress Notes (Addendum)
Rachel [name] Date of Birth: 27-Jan-1951 MRN: [id number] Primary Care Provider:Shaw, Emily Filbert., MD Primary Cardiologist: Rex Kras, DO, [facility] (established care 03/10/2020)  Date: 03/19/21 Last Office Visit: 12/04/2020   Chief Complaint  Patient presents with   Chest Pain   Follow-up    HPI  Rachel [name] is a 70 y.o.  female who presents to the office with a chief complaint of " Chest pain." Patient's past medical history and cardiovascular risk factors include: Chronic systolic and diastolic heart failure/stage C/NYHA class II, nonischemic cardiomyopathy, multiple system atrophy, asthma, history of left lower extremity DVT, hypertension, hyperlipidemia, hypothyroidism, left bundle branch block, advanced age, postmenopausal female.  She is accompanied by her husband Rachel [name] at today's office visit.  Initially seen in the hospital back in July 2021 for symptoms of shortness of breath and was diagnosed with cardiomyopathy and combined systolic and diastolic heart failure.  Her medical therapy has been uptitrated in a sequential pattern to the maximally tolerated doses and repeat echocardiogram notes normalization of LVEF/recovered cardiomyopathy as of October 2021.  She calls in to the office to be evaluated sooner than her scheduled appointment for evaluation of chest pain.  Patient states that she has been under a lot of stress both emotionally and financially due to family issues and has been experiencing chest pain for the last 3 weeks.  The symptoms are intermittent more noticeable while she is resting at night, difficult for her to go to sleep, located over the left breast, intensity 5 out of 10, sensation sharp to dull, lasting for a few hours, self-limited.  The symptoms are now brought on by effort related activities nor does it resolve with rest.  They are usually self-limited.  Since they recently moved from a house to the apartment and there are a lot of fleas they thought that  her chest pain was secondary to fleas however, I assured her that this is very less likely.  ALLERGIES: Allergies  Allergen Reactions   Amantadine Hcl     Other reaction(s): Hallucinations   Azithromycin     Other reaction(s): thrush   Crestor [Rosuvastatin]     Other reaction(s): confusion   Ezetimibe     Other reaction(s): disoriented/confusion   Ezetimibe-Simvastatin     Other reaction(s): myalgias   Fosamax [Alendronate] Other (See Comments)    headache   Miacalcin [Calcitonin]     Other reaction(s): headache   Symbicort [Budesonide-Formoterol Fumarate] Other (See Comments)    Shakes and muscle weakness   MEDICATION LIST PRIOR TO VISIT: Current Outpatient Medications on File Prior to Visit  Medication Sig Dispense Refill   albuterol (PROVENTIL) (2.5 MG/3ML) 0.083% nebulizer solution Take 3 mLs (2.5 mg total) by nebulization every 6 (six) hours as needed for wheezing or shortness of breath. 120 mL 11   Carbidopa-Levodopa ER (SINEMET CR) 25-100 MG tablet controlled release TAKE 2 TABLETS BEFORE BREAKFAST;TAKE 1 TABLET BY MOUTH EVERY MORNING WITH BREAKFAST;TAKE 1 TAB BY MOUTH DAILY AT NOON;TAKE 1 TABLET BY MOUTH EVERY EVENING AT DINNER;TAKE 2 TABLETS BY MOUTH DAILY AT BEDTIME 210 tablet 5   citalopram (CELEXA) 20 MG tablet Take 20 mg by mouth daily.     DULERA 100-5 MCG/ACT AERO Inhale 2 puffs into the lungs in the morning and at bedtime.     furosemide (LASIX) 20 MG tablet Take 10 mg by mouth daily.     ipratropium (ATROVENT) 0.06 % nasal spray SPRAY 2 SPRAYS IN EACH NOSTRIL EVERY 6 HOURS AS NEEDED FOR  RUNNY NOSE 45 mL 1   levothyroxine (SYNTHROID) 112 MCG tablet Take 112 mcg by mouth daily.     LINZESS 290 MCG CAPS capsule Take 290 mcg by mouth daily as needed.     metoprolol succinate (TOPROL XL) 25 MG 24 hr tablet Take 0.5 tablets (12.5 mg total) by mouth daily. Hold if top blood pressure number less than 100 mmHg or pulse less than 60 bpm. 15 tablet 2   montelukast (SINGULAIR)  10 MG tablet TAKE 1 TABLET BY MOUTH ONCE DAILY 30 tablet 10   Multiple Vitamin (MULTIVITAMIN WITH MINERALS) TABS tablet Take 2 tablets by mouth daily.      pantoprazole (PROTONIX) 40 MG tablet Take 1 tablet (40 mg total) by mouth 2 (two) times daily. 180 tablet 1   potassium chloride SA (KLOR-CON) 20 MEQ tablet Take 1 tablet by mouth daily at 12 noon.     pravastatin (PRAVACHOL) 40 MG tablet Take 1 tablet (40 mg total) by mouth daily at 6 PM. 30 tablet 0   sacubitril-valsartan (ENTRESTO) 24-26 MG Take 1 tablet by mouth 2 (two) times daily. 180 tablet 0   VOLTAREN 1 % GEL Apply 1 application topically 4 (four) times daily as needed (pain).      Current Facility-Administered Medications on File Prior to Visit  Medication Dose Route Frequency Provider Last Rate Last Admin   mometasone-formoterol (DULERA) 100-5 MCG/ACT inhaler 2 puff  2 puff Inhalation BID Spero Geralds, MD        PAST MEDICAL HISTORY: Past Medical History:  Diagnosis Date   Allergic rhinitis    Anemia    Arthritis    Asthma    Blood clot in vein  age 34   left leg   Depression    GERD (gastroesophageal reflux disease)    Hyperlipidemia    Hypertension    pt denies, in physician record   Hypothyroidism    Kidney stones    Multiple pregnancy loss, not currently pregnant    multiple miscarriages-pt.unsure of #   Multiple system atrophy ([place]) 2015   Nephrolithiasis    hx of kidney stones, last 6 months ago   Osteopenia    Osteoporosis    Parkinsonism ([place])    Tendinitis    left foot   Vitamin D deficiency     PAST SURGICAL HISTORY: Past Surgical History:  Procedure Laterality Date   BREAST EXCISIONAL BIOPSY Right    BREAST SURGERY     rt. lumpectomy, benign   colonscopy  2011   cystoscopy  2012   JOINT REPLACEMENT  05-2007   right knee replacment/revision   KIDNEY STONE SURGERY     LEFT HEART CATH AND CORONARY ANGIOGRAPHY N/A 02/21/2020   Procedure: LEFT HEART CATH AND CORONARY ANGIOGRAPHY;  Surgeon:  Adrian Prows, MD;  Location: [facility];  Service: Cardiovascular;  Laterality: N/A;   REPLACEMENT TOTAL KNEE Left 06/05/12   TOTAL KNEE REVISION  06/03/2012   Procedure: TOTAL KNEE REVISION;  Surgeon: Gearlean Alf, MD;  Location: [facility];  Service: Orthopedics;  Laterality: Left;  Revision of a Left Uni Knee to a Total Knee Arthroplasty    FAMILY HISTORY: The patient's family history includes Asthma in her sister; Breast cancer in her sister; Diabetes in her mother; Heart attack in her brother; Heart failure in her father, mother, and sister; Thyroid disease in her mother.   SOCIAL HISTORY:  The patient  reports that she has never smoked. She has never used smokeless  tobacco. She reports that she does not drink alcohol and does not use drugs.  Review of Systems  Constitutional: Negative for chills and fever.  HENT:  Negative for hoarse voice and nosebleeds.   Eyes:  Negative for discharge, double vision and pain.  Cardiovascular:  Positive for chest pain. Negative for claudication, dyspnea on exertion, leg swelling, near-syncope, orthopnea, palpitations, paroxysmal nocturnal dyspnea and syncope.  Respiratory:  Positive for shortness of breath (at baseline). Negative for hemoptysis.   Musculoskeletal:  Negative for muscle cramps and myalgias.  Gastrointestinal:  Negative for abdominal pain, constipation, diarrhea, hematemesis, hematochezia, melena, nausea and vomiting.   PHYSICAL EXAM: Vitals with BMI 03/19/2021 01/08/2021 01/08/2021  Height '5\' 6"'$  - -  Weight 164 lbs - -  BMI [id number] - -  Systolic [id number]  Diastolic 66 88 67  Pulse 68 57 58    CONSTITUTIONAL: Age-appropriate female, presents in a wheelchair, hemodynamically stable, well-nourished. No acute distress.  SKIN: Skin is warm and dry. No rash noted. No cyanosis. No pallor. No jaundice HEAD: Normocephalic and atraumatic.  EYES: No scleral icterus MOUTH/THROAT: Moist oral membranes.  NECK: No JVD present. No  thyromegaly noted. No carotid bruits  LYMPHATIC: No visible cervical adenopathy.  CHEST Normal respiratory effort. No intercostal retractions  LUNGS: Clear to auscultation bilaterally no stridor. No wheezes. No rales.  CARDIOVASCULAR: Regular, positive S1-S2, no murmurs rubs or gallops appreciated. ABDOMINAL: Nonobese, soft, nontender, nondistended, positive bowel sounds in all 4 quadrants no apparent ascites.  EXTREMITIES: No pitting edema.  Brace over her left knee. HEMATOLOGIC: No significant bruising NEUROLOGIC: Oriented to person, place, and time. Nonfocal. Normal muscle tone.  PSYCHIATRIC: Normal mood and affect. Normal behavior. Cooperative  CARDIAC DATABASE: EKG: 11/29/2020: Sinus bradycardia, 56 bpm, normal axis, old anteroseptal infarct, nonspecific ST-T changes.    Echocardiogram: 05/26/2020:  Normal LV systolic function with visual EF 50-55%. Left ventricle cavity is normal in size. Normal global wall motion. Doppler evidence of grade I (impaired) diastolic dysfunction, normal LAP.  Left atrial cavity is mildly dilated.  Mild (Grade I) mitral regurgitation.  Mild to moderate tricuspid regurgitation. No evidence of pulmonary  hypertension. RVSP measures 33 mmHg.  Mild pulmonic regurgitation.  IVC is dilated with a respiratory response of >50%.  Compared to prior study dated 02/17/2020 LVEF has improved from 30-35% to 50-55%, Now grade I diastolic dysfunction, otherwise no other significant change.  Stress Testing:  NA  Heart Catheterization: Left Heart Catheterization 02/21/20:  There is severe left ventricular systolic dysfunction. The left ventricular ejection fraction is less than 25% by visual estimate. LV end diastolic pressure is normal. Normal coronary arteries, right dominant circulation.  LABORATORY DATA: CBC Latest Ref Rng & Units 01/08/2021 04/11/2020 02/19/2020  WBC 4.0 - 10.5 K/uL 7.4 5.1 7.5  Hemoglobin 12.0 - 15.0 g/dL 12.0 12.4 11.5(L)  Hematocrit 36.0 -  46.0 % 37.1 37.3 35.4(L)  Platelets 150 - 400 K/uL 355 - 267    CMP Latest Ref Rng & Units 01/08/2021 05/24/2020 04/27/2020  Glucose 70 - 99 mg/dL 94 84 93  BUN 8 - 23 mg/dL 45(H) 22 20  Creatinine 0.44 - 1.00 mg/dL 1.69(H) 1.37(H) 1.65(H)  Sodium 135 - 145 mmol/L 138 139 137  Potassium 3.5 - 5.1 mmol/L 4.4 4.1 4.3  Chloride 98 - 111 mmol/L 104 101 99  CO2 22 - 32 mmol/L '25 26 25  '$ Calcium 8.9 - 10.3 mg/dL 8.7(L) 9.1 9.8  Total Protein 6.5 - 8.1 g/dL 6.7 - -  Total  Bilirubin 0.3 - 1.2 mg/dL 0.4 - -  Alkaline Phos 38 - 126 U/L 83 - -  AST 15 - 41 U/L 16 - -  ALT 0 - 44 U/L 5 - -   Lipid Panel     Component Value Date/Time   CHOL 236 (H) 02/18/2020 0602   TRIG 47 02/18/2020 0602   HDL 65 02/18/2020 0602   CHOLHDL 3.6 02/18/2020 0602   VLDL 9 02/18/2020 0602   LDLCALC 162 (H) 02/18/2020 0602    No results found for: HGBA1C No components found for: NTPROBNP Lab Results  Component Value Date   TSH 1.862 02/18/2020    Cardiac Panel (last 3 results) No results for input(s): CKTOTAL, CKMB, TROPONINIHS, RELINDX in the last 72 hours.  IMPRESSION:    ICD-10-CM   1. Precordial pain  R07.2 EKG 12-Lead    PCV MYOCARDIAL PERFUSION WITH LEXISCAN    2. Chronic heart failure with preserved ejection fraction (HFpEF) (HCC)  I50.32     3. Recovered nonischemic cardiomyopathy ([place])  I42.8     4. Mixed hyperlipidemia  E78.2     5. LBBB (left bundle branch block)  I44.7 PCV MYOCARDIAL PERFUSION WITH LEXISCAN    6. Hypertensive heart and kidney disease with HF and with CKD stage III (HCC)  I13.0    N18.30     7. Hx of deep venous thrombosis  Z86.718        RECOMMENDATIONS: Gracelynne Kuchar is a 70 y.o. female whose past medical history and cardiovascular risk factors include: Chronic systolic and diastolic heart failure/stage C/NYHA class II, nonischemic cardiomyopathy, multiple system atrophy, asthma, history of left lower extremity DVT, hypertension, hyperlipidemia, hypothyroidism,  left bundle branch block, advanced age, postmenopausal female.  Precordial pain: Acute exacerbation Patient's symptoms of chest discomfort appears to be predominately noncardiac. No active chest pain. EKG is nonischemic. Patient recently had a left heart catheterization in 2021 which noted normal epicardial coronary arteries. However given her risk factors we did discuss considering Lexiscan to evaluate for reversible ischemia.  I recommended her to participate in activities that brings her pleasure and decreases a stress and to see if this improves the frequency of her precordial pain. Not able to comment on effort related symptoms as she was present in a wheelchair and had a brace over the left lower leg. Patient is encouraged to seek medical attention by going to the closest ER via EMS if her symptoms increase in intensity, frequency, duration or has classic chest pain as discussed at today's office visit.   Patient states that for now they would like to hold off on additional testing due to financial constraints but are willing to proceed if his symptoms remain or get progressive. Continue current medical therapy.   Her blood pressures as well as her heart rate are very well controlled.  I do not want to uptitrate her medical therapy as she may become more symptomatic.  Chronic heart failure with preserved ejection fraction (HFpEF) ([place]) Last hospitalization for heart failure July 2021. Euvolemic. Medications reconciled. Low-salt diet recommended. Patient has lost approximately 2 more pounds since last office visit. Last echocardiogram notes improvement in LVEF from 30-35% to 50-55%.  Recovered nonischemic cardiomyopathy ([place]) See above  Mixed hyperlipidemia Currently on pravastatin. Does not endorse myalgias. Currently being followed by PCP.  I have encouraged her to bring in her labs that are performed by her PCP for reference.  Hypertensive heart and kidney disease with HF and  with CKD stage III (  [place]) Blood pressure at today's office visit is within excellent control. Medications reconciled. Low-salt diet recommended. Continue follow-up.  FINAL MEDICATION LIST END OF ENCOUNTER: No orders of the defined types were placed in this encounter.   Medications Discontinued During This Encounter  Medication Reason   vitamin E 100 UNIT capsule Error   HYDROcodone-acetaminophen (NORCO/VICODIN) 5-325 MG tablet Error   famotidine (PEPCID) 40 MG tablet Error     Current Outpatient Medications:    albuterol (PROVENTIL) (2.5 MG/3ML) 0.083% nebulizer solution, Take 3 mLs (2.5 mg total) by nebulization every 6 (six) hours as needed for wheezing or shortness of breath., Disp: 120 mL, Rfl: 11   Carbidopa-Levodopa ER (SINEMET CR) 25-100 MG tablet controlled release, TAKE 2 TABLETS BEFORE BREAKFAST;TAKE 1 TABLET BY MOUTH EVERY MORNING WITH BREAKFAST;TAKE 1 TAB BY MOUTH DAILY AT NOON;TAKE 1 TABLET BY MOUTH EVERY EVENING AT DINNER;TAKE 2 TABLETS BY MOUTH DAILY AT BEDTIME, Disp: 210 tablet, Rfl: 5   citalopram (CELEXA) 20 MG tablet, Take 20 mg by mouth daily., Disp: , Rfl:    DULERA 100-5 MCG/ACT AERO, Inhale 2 puffs into the lungs in the morning and at bedtime., Disp: , Rfl:    furosemide (LASIX) 20 MG tablet, Take 10 mg by mouth daily., Disp: , Rfl:    ipratropium (ATROVENT) 0.06 % nasal spray, SPRAY 2 SPRAYS IN EACH NOSTRIL EVERY 6 HOURS AS NEEDED FOR RUNNY NOSE, Disp: 45 mL, Rfl: 1   levothyroxine (SYNTHROID) 112 MCG tablet, Take 112 mcg by mouth daily., Disp: , Rfl:    LINZESS 290 MCG CAPS capsule, Take 290 mcg by mouth daily as needed., Disp: , Rfl:    metoprolol succinate (TOPROL XL) 25 MG 24 hr tablet, Take 0.5 tablets (12.5 mg total) by mouth daily. Hold if top blood pressure number less than 100 mmHg or pulse less than 60 bpm., Disp: 15 tablet, Rfl: 2   montelukast (SINGULAIR) 10 MG tablet, TAKE 1 TABLET BY MOUTH ONCE DAILY, Disp: 30 tablet, Rfl: 10   Multiple Vitamin  (MULTIVITAMIN WITH MINERALS) TABS tablet, Take 2 tablets by mouth daily. , Disp: , Rfl:    pantoprazole (PROTONIX) 40 MG tablet, Take 1 tablet (40 mg total) by mouth 2 (two) times daily., Disp: 180 tablet, Rfl: 1   potassium chloride SA (KLOR-CON) 20 MEQ tablet, Take 1 tablet by mouth daily at 12 noon., Disp: , Rfl:    pravastatin (PRAVACHOL) 40 MG tablet, Take 1 tablet (40 mg total) by mouth daily at 6 PM., Disp: 30 tablet, Rfl: 0   sacubitril-valsartan (ENTRESTO) 24-26 MG, Take 1 tablet by mouth 2 (two) times daily., Disp: 180 tablet, Rfl: 0   VOLTAREN 1 % GEL, Apply 1 application topically 4 (four) times daily as needed (pain). , Disp: , Rfl:   Current Facility-Administered Medications:    mometasone-formoterol (DULERA) 100-5 MCG/ACT inhaler 2 puff, 2 puff, Inhalation, BID, Spero Geralds, MD  Orders Placed This Encounter  Procedures   PCV MYOCARDIAL PERFUSION WITH LEXISCAN   EKG 12-Lead   --Continue cardiac medications as reconciled in final medication list. --Return in about 1 month (around 04/19/2021) for Follow up, Chest pain, Review test results. Or sooner if needed. --Continue follow-up with your primary care physician regarding the management of your other chronic comorbid conditions.  Patient's questions and concerns were addressed to her satisfaction. She voices understanding of the instructions provided during this encounter.   This note was created using a voice recognition software as a result there may be grammatical errors  inadvertently enclosed that do not reflect the nature of this encounter. Every attempt is made to correct such errors.  Rex Kras, [place], [facility]  Pager: [phone] Office: [phone]

## 2021-03-28 DIAGNOSIS — E7800 Pure hypercholesterolemia, unspecified: Secondary | ICD-10-CM | POA: Diagnosis not present

## 2021-03-28 DIAGNOSIS — E039 Hypothyroidism, unspecified: Secondary | ICD-10-CM | POA: Diagnosis not present

## 2021-03-28 DIAGNOSIS — I10 Essential (primary) hypertension: Secondary | ICD-10-CM | POA: Diagnosis not present

## 2021-03-28 DIAGNOSIS — M810 Age-related osteoporosis without current pathological fracture: Secondary | ICD-10-CM | POA: Diagnosis not present

## 2021-04-04 DIAGNOSIS — R5383 Other fatigue: Secondary | ICD-10-CM | POA: Diagnosis not present

## 2021-04-04 DIAGNOSIS — G903 Multi-system degeneration of the autonomic nervous system: Secondary | ICD-10-CM | POA: Diagnosis not present

## 2021-04-04 DIAGNOSIS — I83009 Varicose veins of unspecified lower extremity with ulcer of unspecified site: Secondary | ICD-10-CM | POA: Diagnosis not present

## 2021-04-04 DIAGNOSIS — K5900 Constipation, unspecified: Secondary | ICD-10-CM | POA: Diagnosis not present

## 2021-04-09 DIAGNOSIS — M9901 Segmental and somatic dysfunction of cervical region: Secondary | ICD-10-CM | POA: Diagnosis not present

## 2021-04-09 DIAGNOSIS — M5030 Other cervical disc degeneration, unspecified cervical region: Secondary | ICD-10-CM | POA: Diagnosis not present

## 2021-04-09 DIAGNOSIS — M9903 Segmental and somatic dysfunction of lumbar region: Secondary | ICD-10-CM | POA: Diagnosis not present

## 2021-04-09 DIAGNOSIS — M9904 Segmental and somatic dysfunction of sacral region: Secondary | ICD-10-CM | POA: Diagnosis not present

## 2021-04-23 ENCOUNTER — Other Ambulatory Visit: Payer: Self-pay

## 2021-04-23 ENCOUNTER — Encounter: Payer: Self-pay | Admitting: Cardiology

## 2021-04-23 ENCOUNTER — Ambulatory Visit: Payer: Medicare Other | Admitting: Cardiology

## 2021-04-23 VITALS — BP 131/70 | HR 72 | Resp 16 | Ht 66.0 in | Wt 175.6 lb

## 2021-04-23 DIAGNOSIS — I447 Left bundle-branch block, unspecified: Secondary | ICD-10-CM

## 2021-04-23 DIAGNOSIS — I428 Other cardiomyopathies: Secondary | ICD-10-CM | POA: Diagnosis not present

## 2021-04-23 DIAGNOSIS — E782 Mixed hyperlipidemia: Secondary | ICD-10-CM | POA: Diagnosis not present

## 2021-04-23 DIAGNOSIS — I130 Hypertensive heart and chronic kidney disease with heart failure and stage 1 through stage 4 chronic kidney disease, or unspecified chronic kidney disease: Secondary | ICD-10-CM

## 2021-04-23 DIAGNOSIS — R072 Precordial pain: Secondary | ICD-10-CM

## 2021-04-23 DIAGNOSIS — I5032 Chronic diastolic (congestive) heart failure: Secondary | ICD-10-CM

## 2021-04-23 DIAGNOSIS — N183 Chronic kidney disease, stage 3 unspecified: Secondary | ICD-10-CM

## 2021-04-23 DIAGNOSIS — Z86718 Personal history of other venous thrombosis and embolism: Secondary | ICD-10-CM

## 2021-04-23 NOTE — Progress Notes (Signed)
[name] Date of Birth: Dec 20, 1950 MRN: [id number] Primary Care Provider:Shaw, Emily Filbert., MD Primary Cardiologist: Rex Kras, DO, [facility] (established care 03/10/2020)  Date: 04/23/21 Last Office Visit: 03/19/2021  Chief Complaint  Patient presents with   Precordial pain   Results   Follow-up    HPI  Rachel [name] is a 70 y.o.  female who presents to the office with a chief complaint of " 1 month reevaluate chest pain. " Patient's past medical history and cardiovascular risk factors include: Chronic systolic and diastolic heart failure/stage C/NYHA class II, nonischemic cardiomyopathy, multiple system atrophy, asthma, history of left lower extremity DVT, hypertension, hyperlipidemia, hypothyroidism, left bundle branch block, advanced age, postmenopausal female.  She is accompanied by her husband Rachel [name] at today's office visit.  Initially seen in July 2021 for symptoms of shortness of breath and was diagnosed with cardiomyopathy.  She underwent left heart catheterization was noted to have normal epicardial coronary arteries.  She was started on GDMT and repeat echocardiogram notes preserved LVEF suggestive of recovered cardiomyopathy as of October 2021.  Patient was scheduled to have an annual visit in October 2022; however, followed up back in August 2022 for symptoms of chest pain.  Symptoms of chest pain appear to be atypical; however, given her risk factors we did discuss ordering a nuclear stress test for restratification.  She has not undergone the stress test as of yet and is back for 1 month follow-up visit.  Since last office visit patient states that she has intermittent chest discomfort approximately 3 times a week, located anteriorly, describes it as an ache/pleuritic-like discomfort, intensity 6 out of 10, not brought on by effort related activities, and does not resolve with rest.  Patient's husband also notes that she has been having worsening left shoulder  osteoarthritis requiring joint injections, she also has strained her upper torso changing positions late last week, continues to have stress with regards to both emotional and financial conditions due to family situations, and the discomfort is also pleuritic.  He feels that she should hold off on stress testing at this time based on his prior experiences.  ALLERGIES: Allergies  Allergen Reactions   Amantadine Hcl     Other reaction(s): Hallucinations   Azithromycin     Other reaction(s): thrush   Crestor [Rosuvastatin]     Other reaction(s): confusion   Ezetimibe     Other reaction(s): disoriented/confusion   Ezetimibe-Simvastatin     Other reaction(s): myalgias   Fosamax [Alendronate] Other (See Comments)    headache   Miacalcin [Calcitonin]     Other reaction(s): headache   Symbicort [Budesonide-Formoterol Fumarate] Other (See Comments)    Shakes and muscle weakness   MEDICATION LIST PRIOR TO VISIT: Current Outpatient Medications on File Prior to Visit  Medication Sig Dispense Refill   albuterol (PROVENTIL) (2.5 MG/3ML) 0.083% nebulizer solution Take 3 mLs (2.5 mg total) by nebulization every 6 (six) hours as needed for wheezing or shortness of breath. 120 mL 11   Carbidopa-Levodopa ER (SINEMET CR) 25-100 MG tablet controlled release TAKE 2 TABLETS BEFORE BREAKFAST;TAKE 1 TABLET BY MOUTH EVERY MORNING WITH BREAKFAST;TAKE 1 TAB BY MOUTH DAILY AT NOON;TAKE 1 TABLET BY MOUTH EVERY EVENING AT DINNER;TAKE 2 TABLETS BY MOUTH DAILY AT BEDTIME 210 tablet 5   citalopram (CELEXA) 20 MG tablet Take 20 mg by mouth daily.     DULERA 100-5 MCG/ACT AERO Inhale 2 puffs into the lungs in the morning and at bedtime.     furosemide (LASIX)  20 MG tablet Take 10 mg by mouth daily.     ipratropium (ATROVENT) 0.06 % nasal spray SPRAY 2 SPRAYS IN EACH NOSTRIL EVERY 6 HOURS AS NEEDED FOR RUNNY NOSE 45 mL 1   levothyroxine (SYNTHROID) 112 MCG tablet Take 112 mcg by mouth daily.     LINZESS 290 MCG CAPS  capsule Take 290 mcg by mouth daily as needed.     metoprolol succinate (TOPROL XL) 25 MG 24 hr tablet Take 0.5 tablets (12.5 mg total) by mouth daily. Hold if top blood pressure number less than 100 mmHg or pulse less than 60 bpm. 15 tablet 2   montelukast (SINGULAIR) 10 MG tablet TAKE 1 TABLET BY MOUTH ONCE DAILY 30 tablet 10   Multiple Vitamin (MULTIVITAMIN WITH MINERALS) TABS tablet Take 2 tablets by mouth daily.      pantoprazole (PROTONIX) 40 MG tablet Take 1 tablet (40 mg total) by mouth 2 (two) times daily. 180 tablet 1   potassium chloride SA (KLOR-CON) 20 MEQ tablet Take 1 tablet by mouth daily at 12 noon.     pravastatin (PRAVACHOL) 40 MG tablet Take 1 tablet (40 mg total) by mouth daily at 6 PM. 30 tablet 0   sacubitril-valsartan (ENTRESTO) 24-26 MG Take 1 tablet by mouth 2 (two) times daily. 180 tablet 0   VOLTAREN 1 % GEL Apply 1 application topically 4 (four) times daily as needed (pain).      Current Facility-Administered Medications on File Prior to Visit  Medication Dose Route Frequency Provider Last Rate Last Admin   mometasone-formoterol (DULERA) 100-5 MCG/ACT inhaler 2 puff  2 puff Inhalation BID Spero Geralds, MD        PAST MEDICAL HISTORY: Past Medical History:  Diagnosis Date   Allergic rhinitis    Anemia    Arthritis    Asthma    Blood clot in vein  age 20   left leg   Depression    GERD (gastroesophageal reflux disease)    Hyperlipidemia    Hypertension    pt denies, in physician record   Hypothyroidism    Kidney stones    Multiple pregnancy loss, not currently pregnant    multiple miscarriages-pt.unsure of #   Multiple system atrophy ([place]) 2015   Nephrolithiasis    hx of kidney stones, last 6 months ago   Osteopenia    Osteoporosis    Parkinsonism ([place])    Tendinitis    left foot   Vitamin D deficiency     PAST SURGICAL HISTORY: Past Surgical History:  Procedure Laterality Date   BREAST EXCISIONAL BIOPSY Right    BREAST SURGERY     rt.  lumpectomy, benign   colonscopy  2011   cystoscopy  2012   JOINT REPLACEMENT  05-2007   right knee replacment/revision   KIDNEY STONE SURGERY     LEFT HEART CATH AND CORONARY ANGIOGRAPHY N/A 02/21/2020   Procedure: LEFT HEART CATH AND CORONARY ANGIOGRAPHY;  Surgeon: Adrian Prows, MD;  Location: [facility];  Service: Cardiovascular;  Laterality: N/A;   REPLACEMENT TOTAL KNEE Left 06/05/12   TOTAL KNEE REVISION  06/03/2012   Procedure: TOTAL KNEE REVISION;  Surgeon: Gearlean Alf, MD;  Location: [facility];  Service: Orthopedics;  Laterality: Left;  Revision of a Left Uni Knee to a Total Knee Arthroplasty    FAMILY HISTORY: The patient's family history includes Asthma in her sister; Breast cancer in her sister; Diabetes in her mother; Heart attack in her brother; Heart  failure in her father, mother, and sister; Thyroid disease in her mother.   SOCIAL HISTORY:  The patient  reports that she has never smoked. She has never used smokeless tobacco. She reports that she does not drink alcohol and does not use drugs.  Review of Systems  Constitutional: Negative for chills and fever.  HENT:  Negative for hoarse voice and nosebleeds.   Eyes:  Negative for discharge, double vision and pain.  Cardiovascular:  Positive for chest pain (ache and pleuritic). Negative for claudication, dyspnea on exertion, leg swelling, near-syncope, orthopnea, palpitations, paroxysmal nocturnal dyspnea and syncope.  Respiratory:  Positive for shortness of breath (at baseline). Negative for hemoptysis.   Musculoskeletal:  Negative for muscle cramps and myalgias.  Gastrointestinal:  Negative for abdominal pain, constipation, diarrhea, hematemesis, hematochezia, melena, nausea and vomiting.   PHYSICAL EXAM: Vitals with BMI 04/23/2021 03/19/2021 01/08/2021  Height '5\' 6"'$  '5\' 6"'$  -  Weight 175 lbs 10 oz 164 lbs -  BMI [id number] -  Systolic [id number]  Diastolic 70 66 88  Pulse 72 68 57    CONSTITUTIONAL:  Age-appropriate female, presents in a wheelchair, hemodynamically stable, well-nourished. No acute distress.  SKIN: Skin is warm and dry. No rash noted. No cyanosis. No pallor. No jaundice HEAD: Normocephalic and atraumatic.  EYES: No scleral icterus MOUTH/THROAT: Moist oral membranes.  NECK: No JVD present. No thyromegaly noted. No carotid bruits  LYMPHATIC: No visible cervical adenopathy.  CHEST Normal respiratory effort. No intercostal retractions  LUNGS: Clear to auscultation bilaterally no stridor. No wheezes. No rales.  CARDIOVASCULAR: Regular, positive S1-S2, no murmurs rubs or gallops appreciated. ABDOMINAL: Nonobese, soft, nontender, nondistended, positive bowel sounds in all 4 quadrants no apparent ascites.  EXTREMITIES: No pitting edema.  Brace over her left knee. HEMATOLOGIC: No significant bruising NEUROLOGIC: Oriented to person, place, and time. Nonfocal. Normal muscle tone.  PSYCHIATRIC: Normal mood and affect. Normal behavior. Cooperative  CARDIAC DATABASE: EKG: 03/19/2021: Normal sinus rhythm, 65 bpm, poor R wave progression, without underlying ischemia or injury pattern.   Echocardiogram: 05/26/2020:  Normal LV systolic function with visual EF 50-55%. Left ventricle cavity is normal in size. Normal global wall motion. Doppler evidence of grade I (impaired) diastolic dysfunction, normal LAP.  Left atrial cavity is mildly dilated.  Mild (Grade I) mitral regurgitation.  Mild to moderate tricuspid regurgitation. No evidence of pulmonary  hypertension. RVSP measures 33 mmHg.  Mild pulmonic regurgitation.  IVC is dilated with a respiratory response of >50%.  Compared to prior study dated 02/17/2020 LVEF has improved from 30-35% to 50-55%, Now grade I diastolic dysfunction, otherwise no other significant change.  Stress Testing:  NA  Heart Catheterization: Left Heart Catheterization 02/21/20:  There is severe left ventricular systolic dysfunction. The left ventricular  ejection fraction is less than 25% by visual estimate. LV end diastolic pressure is normal. Normal coronary arteries, right dominant circulation.  LABORATORY DATA: CBC Latest Ref Rng & Units 01/08/2021 04/11/2020 02/19/2020  WBC 4.0 - 10.5 K/uL 7.4 5.1 7.5  Hemoglobin 12.0 - 15.0 g/dL 12.0 12.4 11.5(L)  Hematocrit 36.0 - 46.0 % 37.1 37.3 35.4(L)  Platelets 150 - 400 K/uL 355 - 267    CMP Latest Ref Rng & Units 01/08/2021 05/24/2020 04/27/2020  Glucose 70 - 99 mg/dL 94 84 93  BUN 8 - 23 mg/dL 45(H) 22 20  Creatinine 0.44 - 1.00 mg/dL 1.69(H) 1.37(H) 1.65(H)  Sodium 135 - 145 mmol/L 138 139 137  Potassium 3.5 - 5.1 mmol/L 4.4 4.1  4.3  Chloride 98 - 111 mmol/L 104 101 99  CO2 22 - 32 mmol/L '25 26 25  '$ Calcium 8.9 - 10.3 mg/dL 8.7(L) 9.1 9.8  Total Protein 6.5 - 8.1 g/dL 6.7 - -  Total Bilirubin 0.3 - 1.2 mg/dL 0.4 - -  Alkaline Phos 38 - 126 U/L 83 - -  AST 15 - 41 U/L 16 - -  ALT 0 - 44 U/L 5 - -   Lipid Panel     Component Value Date/Time   CHOL 236 (H) 02/18/2020 0602   TRIG 47 02/18/2020 0602   HDL 65 02/18/2020 0602   CHOLHDL 3.6 02/18/2020 0602   VLDL 9 02/18/2020 0602   LDLCALC 162 (H) 02/18/2020 0602    No results found for: HGBA1C No components found for: NTPROBNP Lab Results  Component Value Date   TSH 1.862 02/18/2020    Cardiac Panel (last 3 results) No results for input(s): CKTOTAL, CKMB, TROPONINIHS, RELINDX in the last 72 hours.  IMPRESSION:    ICD-10-CM   1. Precordial pain  R07.2     2. Chronic heart failure with preserved ejection fraction (HFpEF) (HCC)  I50.32     3. Recovered nonischemic cardiomyopathy ([place])  I42.8     4. Mixed hyperlipidemia  E78.2     5. LBBB (left bundle branch block)  I44.7     6. Hypertensive heart and kidney disease with HF and with CKD stage III (HCC)  I13.0    N18.30     7. Hx of deep venous thrombosis  Z86.718        RECOMMENDATIONS: Rachel [name] is a 70 y.o. female whose past medical history and cardiovascular  risk factors include: Chronic systolic and diastolic heart failure/stage C/NYHA class II, nonischemic cardiomyopathy, multiple system atrophy, asthma, history of left lower extremity DVT, hypertension, hyperlipidemia, hypothyroidism, left bundle branch block, advanced age, postmenopausal female.  Precordial pain: Suggestive of noncardiac discomfort. Other etiologies include musculoskeletal discomfort due to recent straining, osteoarthritis requiring joint injections, secondary to her underlying emotional/financial stress with family, also has a pleuritic nature (new compared to last office visit) She would benefit from reevaluation with nuclear stress test.  MPI study was ordered at the last office visit but yet not completed. Medications reconciled. Shared decision was to address the other noncardiac causes of chest discomfort and if the symptoms persist she will proceed with stress testing at that time. Patient is encouraged to seek medical attention by going to the closest ER via EMS if her symptoms increase in intensity, frequency, duration or has classic chest pain as discussed at today's office visit.    Chronic heart failure with preserved ejection fraction (HFpEF) ([place]) Last hospitalization for heart failure July 2021. Euvolemic. Medications reconciled. Low-salt diet recommended. Last echocardiogram notes improvement in LVEF from 30-35% to 50-55%.  Recovered nonischemic cardiomyopathy ([place]) See above  Mixed hyperlipidemia Currently on pravastatin. Does not endorse myalgias. Currently being followed by PCP.  I have encouraged her to bring in her labs that are performed by her PCP for reference.  Hypertensive heart and kidney disease with HF and with CKD stage III ([place]) Blood pressure at today's office visit is within excellent control. Medications reconciled. Low-salt diet recommended. Continue follow-up.  FINAL MEDICATION LIST END OF ENCOUNTER: No orders of the defined types  were placed in this encounter.   There are no discontinued medications.    Current Outpatient Medications:    albuterol (PROVENTIL) (2.5 MG/3ML) 0.083% nebulizer solution, Take 3 mLs (2.5  mg total) by nebulization every 6 (six) hours as needed for wheezing or shortness of breath., Disp: 120 mL, Rfl: 11   Carbidopa-Levodopa ER (SINEMET CR) 25-100 MG tablet controlled release, TAKE 2 TABLETS BEFORE BREAKFAST;TAKE 1 TABLET BY MOUTH EVERY MORNING WITH BREAKFAST;TAKE 1 TAB BY MOUTH DAILY AT NOON;TAKE 1 TABLET BY MOUTH EVERY EVENING AT DINNER;TAKE 2 TABLETS BY MOUTH DAILY AT BEDTIME, Disp: 210 tablet, Rfl: 5   citalopram (CELEXA) 20 MG tablet, Take 20 mg by mouth daily., Disp: , Rfl:    DULERA 100-5 MCG/ACT AERO, Inhale 2 puffs into the lungs in the morning and at bedtime., Disp: , Rfl:    furosemide (LASIX) 20 MG tablet, Take 10 mg by mouth daily., Disp: , Rfl:    ipratropium (ATROVENT) 0.06 % nasal spray, SPRAY 2 SPRAYS IN EACH NOSTRIL EVERY 6 HOURS AS NEEDED FOR RUNNY NOSE, Disp: 45 mL, Rfl: 1   levothyroxine (SYNTHROID) 112 MCG tablet, Take 112 mcg by mouth daily., Disp: , Rfl:    LINZESS 290 MCG CAPS capsule, Take 290 mcg by mouth daily as needed., Disp: , Rfl:    metoprolol succinate (TOPROL XL) 25 MG 24 hr tablet, Take 0.5 tablets (12.5 mg total) by mouth daily. Hold if top blood pressure number less than 100 mmHg or pulse less than 60 bpm., Disp: 15 tablet, Rfl: 2   montelukast (SINGULAIR) 10 MG tablet, TAKE 1 TABLET BY MOUTH ONCE DAILY, Disp: 30 tablet, Rfl: 10   Multiple Vitamin (MULTIVITAMIN WITH MINERALS) TABS tablet, Take 2 tablets by mouth daily. , Disp: , Rfl:    pantoprazole (PROTONIX) 40 MG tablet, Take 1 tablet (40 mg total) by mouth 2 (two) times daily., Disp: 180 tablet, Rfl: 1   potassium chloride SA (KLOR-CON) 20 MEQ tablet, Take 1 tablet by mouth daily at 12 noon., Disp: , Rfl:    pravastatin (PRAVACHOL) 40 MG tablet, Take 1 tablet (40 mg total) by mouth daily at 6 PM., Disp: 30  tablet, Rfl: 0   sacubitril-valsartan (ENTRESTO) 24-26 MG, Take 1 tablet by mouth 2 (two) times daily., Disp: 180 tablet, Rfl: 0   VOLTAREN 1 % GEL, Apply 1 application topically 4 (four) times daily as needed (pain). , Disp: , Rfl:   Current Facility-Administered Medications:    mometasone-formoterol (DULERA) 100-5 MCG/ACT inhaler 2 puff, 2 puff, Inhalation, BID, Spero Geralds, MD  No orders of the defined types were placed in this encounter.  --Continue cardiac medications as reconciled in final medication list. --Return in about 3 months (around 07/23/2021) for Follow up, Chest pain, Recovered cardiomyopathy. . Or sooner if needed. --Continue follow-up with your primary care physician regarding the management of your other chronic comorbid conditions.  Patient's questions and concerns were addressed to her satisfaction. She voices understanding of the instructions provided during this encounter.   This note was created using a voice recognition software as a result there may be grammatical errors inadvertently enclosed that do not reflect the nature of this encounter. Every attempt is made to correct such errors.  Total time spent: 31 minutes.  Rex Kras, [place], [facility]  Pager: [phone] Office: [phone]

## 2021-04-24 ENCOUNTER — Ambulatory Visit: Payer: Medicare Other | Admitting: Neurology

## 2021-04-24 NOTE — Progress Notes (Signed)
Assessment/Plan:   1.  MSA  -Discussed importance of staying seated at all times, unless somebody is directly with her.  She has had multiple falls.  Discussed consequences of falls.  -Continue carbidopa/levodopa 25/100 CR, 2/1/1/1/2 at bed  -much better with hallucinations after amantadine discontinued.  -Patient had motorized wheelchair at home.  Pt states that they had to give motorized WC back because she couldn't afford her end of it.  She does have a motorized scooter but they are heavy and lifting it in/out of the car is too difficult.    -discussed meals on wheels.  They are having financial troubles, including with food.  Discussed going to [facility] office and seeing if they qualify for gov't assistance.  Will have my LCSW follow up as well.  Much of this visit was spent discussing this as well as stress in the home and how to mitigate that.  -Previously scheduled for neurocognitive testing in April, 2021 but canceled.  Can let us know if she changes her mind. 2.  Diplopia  -Saw Dr. Hassell Done years ago at [facility].  Diagnosis was congenital large intermittent exotropia/exophoria 3.  Dysphagia  -Last MBE in April, 2021.  Recommendations were regular diet, thin liquid 4.  Weight loss  -some due to nausea from med but not convinced that all of this is from meds.  F/u pcp 5.  Low BP  -pt on metoprolol and entresto.  Metoprolol has been decreased but may need continued decreased.  She is still having dizziness. 6.  Neck pain/RLS  -start gabapentin, 100 mg q hx x 2 weeks and then 100 mg bid.  May need to increase in future.   Subjective:   Rachel [name] was seen today in follow up for MSA.  My previous records were reviewed prior to todays visit as well as outside records available to me.  Last visit I was concerned about her low BP and possibility of syncopal episodes on her meds.  I emailed her cardiologist.  Her cardiologist did decrease her metoprolol the next day to 12.5 mg daily.  She did  f/u with cardiology and was last seen on 8/22 for what was felt to be noncardiac CP.  States that she was told it was MS or lung related.  She is c/o balance issues today.  She is having bad back/neck pain.  She is having shoulder pain and seeing ortho, and got a shot but patient states she wants another.  She has trouble lifting the arm on the L.  No longer living with son.  They are living in a first floor apartment but they state it is accessible.  They are under a ton of stress - had to move and dealt with fleas in the new apartment.  Having financial trouble.  Living with they described as meal to meal, but they do have enough food day today.  C/o RLS.  C/o continued dizziness.  Current prescribed movement disorder medications:  Carbidopa/levodopa 25/100 CR, 2/1/1/1/2 at bed  Previous medications: Carbidopa/levodopa 25/100 IR; amantadine (stopped because of memory change/hallucinations) ALLERGIES:   Allergies  Allergen Reactions   Amantadine Hcl     Other reaction(s): Hallucinations   Azithromycin     Other reaction(s): thrush   Crestor [Rosuvastatin]     Other reaction(s): confusion   Ezetimibe     Other reaction(s): disoriented/confusion   Ezetimibe-Simvastatin     Other reaction(s): myalgias   Fosamax [Alendronate] Other (See Comments)    headache  Miacalcin [Calcitonin]     Other reaction(s): headache   Symbicort [Budesonide-Formoterol Fumarate] Other (See Comments)    Shakes and muscle weakness    CURRENT MEDICATIONS:  Outpatient Encounter Medications as of 04/25/2021  Medication Sig   albuterol (PROVENTIL) (2.5 MG/3ML) 0.083% nebulizer solution Take 3 mLs (2.5 mg total) by nebulization every 6 (six) hours as needed for wheezing or shortness of breath.   Carbidopa-Levodopa ER (SINEMET CR) 25-100 MG tablet controlled release TAKE 2 TABLETS BEFORE BREAKFAST;TAKE 1 TABLET BY MOUTH EVERY MORNING WITH BREAKFAST;TAKE 1 TAB BY MOUTH DAILY AT NOON;TAKE 1 TABLET BY MOUTH EVERY  EVENING AT DINNER;TAKE 2 TABLETS BY MOUTH DAILY AT BEDTIME   citalopram (CELEXA) 20 MG tablet Take 20 mg by mouth daily.   DULERA 100-5 MCG/ACT AERO Inhale 2 puffs into the lungs in the morning and at bedtime.   furosemide (LASIX) 20 MG tablet Take 10 mg by mouth daily.   ipratropium (ATROVENT) 0.06 % nasal spray SPRAY 2 SPRAYS IN EACH NOSTRIL EVERY 6 HOURS AS NEEDED FOR RUNNY NOSE   levothyroxine (SYNTHROID) 112 MCG tablet Take 112 mcg by mouth daily.   LINZESS 290 MCG CAPS capsule Take 290 mcg by mouth daily as needed.   montelukast (SINGULAIR) 10 MG tablet TAKE 1 TABLET BY MOUTH ONCE DAILY   Multiple Vitamin (MULTIVITAMIN WITH MINERALS) TABS tablet Take 2 tablets by mouth daily.    pantoprazole (PROTONIX) 40 MG tablet Take 1 tablet (40 mg total) by mouth 2 (two) times daily.   potassium chloride SA (KLOR-CON) 20 MEQ tablet Take 1 tablet by mouth daily at 12 noon.   pravastatin (PRAVACHOL) 40 MG tablet Take 1 tablet (40 mg total) by mouth daily at 6 PM.   sacubitril-valsartan (ENTRESTO) 24-26 MG Take 1 tablet by mouth 2 (two) times daily.   VOLTAREN 1 % GEL Apply 1 application topically 4 (four) times daily as needed (pain).    metoprolol succinate (TOPROL XL) 25 MG 24 hr tablet Take 0.5 tablets (12.5 mg total) by mouth daily. Hold if top blood pressure number less than 100 mmHg or pulse less than 60 bpm.   Facility-Administered Encounter Medications as of 04/25/2021  Medication   mometasone-formoterol (DULERA) 100-5 MCG/ACT inhaler 2 puff    Objective:   PHYSICAL EXAMINATION:    VITALS:   Vitals:   04/25/21 1502  BP: 110/70  Pulse: 74  SpO2: 98%  Weight: 173 lb (78.5 kg)  Height: '5\' 6"'$  (1.676 m)    Wt Readings from Last 3 Encounters:  04/25/21 173 lb (78.5 kg)  04/23/21 175 lb 9.6 oz (79.7 kg)  03/19/21 164 lb (74.4 kg)     GEN:  The patient appears stated age and is in NAD. HEENT:  Normocephalic, atraumatic.  The mucous membranes are moist.   Neurological  examination:  Orientation: The patient is alert and oriented x3.  She had no problem relating her history. Cranial nerves: There is good facial symmetry with min facial hypomimia. The speech is fluent and clear. Soft palate rises symmetrically and there is no tongue deviation. Hearing is intact to conversational tone. Sensation: Sensation is intact to light touch throughout Motor: Strength is at least antigravity x4.   Movement examination: Tone: There is nl tone in the UE/LE Abnormal movements: there is tremor is LUE rest tremor; there is L leg dyskinesia Coordination:  There is mild decremation with RAM's Gait and Station: She ambulates fairly well with a walker in the hallway.  I have reviewed and interpreted  the following labs independently    Chemistry      Component Value Date/Time   NA 138 01/08/2021 1704   NA 139 05/24/2020 1631   K 4.4 01/08/2021 1704   CL 104 01/08/2021 1704   CO2 25 01/08/2021 1704   BUN 45 (H) 01/08/2021 1704   BUN 22 05/24/2020 1631   CREATININE 1.69 (H) 01/08/2021 1704      Component Value Date/Time   CALCIUM 8.7 (L) 01/08/2021 1704   ALKPHOS 83 01/08/2021 1936   AST 16 01/08/2021 1936   ALT 5 01/08/2021 1936   BILITOT 0.4 01/08/2021 1936       Lab Results  Component Value Date   WBC 7.4 01/08/2021   HGB 12.0 01/08/2021   HCT 37.1 01/08/2021   MCV 92.1 01/08/2021   PLT 355 01/08/2021    Lab Results  Component Value Date   TSH 1.862 02/18/2020    Total time spent on today's visit was 40 minutes, including both face-to-face time and nonface-to-face time.  Time included that spent on review of records (prior notes available to me/labs/imaging if pertinent), discussing treatment and goals, answering patient's questions and coordinating care.  Cc:  Ginger Organ., MD

## 2021-04-25 ENCOUNTER — Other Ambulatory Visit: Payer: Self-pay

## 2021-04-25 ENCOUNTER — Encounter: Payer: Self-pay | Admitting: Neurology

## 2021-04-25 ENCOUNTER — Ambulatory Visit (INDEPENDENT_AMBULATORY_CARE_PROVIDER_SITE_OTHER): Payer: Medicare Other | Admitting: Neurology

## 2021-04-25 VITALS — BP 110/70 | HR 74 | Ht 66.0 in | Wt 173.0 lb

## 2021-04-25 DIAGNOSIS — R42 Dizziness and giddiness: Secondary | ICD-10-CM

## 2021-04-25 DIAGNOSIS — M542 Cervicalgia: Secondary | ICD-10-CM

## 2021-04-25 DIAGNOSIS — G232 Striatonigral degeneration: Secondary | ICD-10-CM | POA: Diagnosis not present

## 2021-04-25 DIAGNOSIS — G2581 Restless legs syndrome: Secondary | ICD-10-CM

## 2021-04-25 MED ORDER — GABAPENTIN 100 MG PO CAPS: 100.0000 mg | ORAL_CAPSULE | Freq: Two times a day (BID) | ORAL | 1 refills | Status: DC

## 2021-04-25 NOTE — Patient Instructions (Signed)
Start gabapentin 100 mg, 1 at bed x 1 week and then 1 tablet twice per day.

## 2021-04-26 ENCOUNTER — Ambulatory Visit: Payer: Medicare Other | Admitting: Neurology

## 2021-04-27 ENCOUNTER — Other Ambulatory Visit: Payer: Self-pay

## 2021-04-27 ENCOUNTER — Telehealth: Payer: Self-pay | Admitting: Cardiology

## 2021-04-27 DIAGNOSIS — I5032 Chronic diastolic (congestive) heart failure: Secondary | ICD-10-CM

## 2021-04-27 MED ORDER — METOPROLOL SUCCINATE ER 25 MG PO TB24: 12.5000 mg | ORAL_TABLET | Freq: Every day | ORAL | 2 refills | Status: DC

## 2021-04-27 NOTE — Telephone Encounter (Signed)
Called and spoke to pt, refill sent.

## 2021-04-27 NOTE — Telephone Encounter (Signed)
Pt is requesting a call regarding a medication refill that needs to be sent to a new pharmacy.

## 2021-05-07 ENCOUNTER — Other Ambulatory Visit: Payer: Self-pay

## 2021-05-07 DIAGNOSIS — I5032 Chronic diastolic (congestive) heart failure: Secondary | ICD-10-CM

## 2021-05-07 MED ORDER — METOPROLOL SUCCINATE ER 25 MG PO TB24: 12.5000 mg | ORAL_TABLET | Freq: Every day | ORAL | 3 refills | Status: DC

## 2021-05-09 DIAGNOSIS — M25512 Pain in left shoulder: Secondary | ICD-10-CM | POA: Diagnosis not present

## 2021-05-25 ENCOUNTER — Other Ambulatory Visit: Payer: Self-pay | Admitting: Internal Medicine

## 2021-05-28 DIAGNOSIS — E785 Hyperlipidemia, unspecified: Secondary | ICD-10-CM | POA: Diagnosis not present

## 2021-05-28 DIAGNOSIS — I10 Essential (primary) hypertension: Secondary | ICD-10-CM | POA: Diagnosis not present

## 2021-05-28 DIAGNOSIS — M810 Age-related osteoporosis without current pathological fracture: Secondary | ICD-10-CM | POA: Diagnosis not present

## 2021-05-28 DIAGNOSIS — E039 Hypothyroidism, unspecified: Secondary | ICD-10-CM | POA: Diagnosis not present

## 2021-06-07 ENCOUNTER — Ambulatory Visit: Payer: Medicare Other | Admitting: Cardiology

## 2021-06-15 ENCOUNTER — Other Ambulatory Visit: Payer: Self-pay | Admitting: Internal Medicine

## 2021-06-22 ENCOUNTER — Other Ambulatory Visit: Payer: Self-pay | Admitting: Internal Medicine

## 2021-07-16 ENCOUNTER — Other Ambulatory Visit (HOSPITAL_COMMUNITY): Payer: Self-pay | Admitting: Registered Nurse

## 2021-07-16 ENCOUNTER — Encounter (HOSPITAL_BASED_OUTPATIENT_CLINIC_OR_DEPARTMENT_OTHER): Payer: Self-pay

## 2021-07-16 ENCOUNTER — Ambulatory Visit (HOSPITAL_BASED_OUTPATIENT_CLINIC_OR_DEPARTMENT_OTHER)
Admission: RE | Admit: 2021-07-16 | Discharge: 2021-07-16 | Disposition: A | Discharge status: Home / Self Care | Payer: Medicare Other | Source: Ambulatory Visit | Attending: Registered Nurse | Admitting: Registered Nurse

## 2021-07-16 ENCOUNTER — Other Ambulatory Visit: Payer: Self-pay

## 2021-07-16 ENCOUNTER — Ambulatory Visit: Payer: Medicare Other | Admitting: Cardiology

## 2021-07-16 DIAGNOSIS — I5022 Chronic systolic (congestive) heart failure: Secondary | ICD-10-CM | POA: Diagnosis not present

## 2021-07-16 DIAGNOSIS — R0789 Other chest pain: Secondary | ICD-10-CM

## 2021-07-16 DIAGNOSIS — R0609 Other forms of dyspnea: Secondary | ICD-10-CM | POA: Insufficient documentation

## 2021-07-16 DIAGNOSIS — G20 Parkinson's disease: Secondary | ICD-10-CM | POA: Diagnosis not present

## 2021-07-16 DIAGNOSIS — I700 Atherosclerosis of aorta: Secondary | ICD-10-CM | POA: Diagnosis not present

## 2021-07-16 DIAGNOSIS — R059 Cough, unspecified: Secondary | ICD-10-CM | POA: Diagnosis not present

## 2021-07-16 DIAGNOSIS — R0602 Shortness of breath: Secondary | ICD-10-CM | POA: Diagnosis not present

## 2021-07-16 DIAGNOSIS — G903 Multi-system degeneration of the autonomic nervous system: Secondary | ICD-10-CM | POA: Diagnosis not present

## 2021-07-16 DIAGNOSIS — R079 Chest pain, unspecified: Secondary | ICD-10-CM | POA: Diagnosis not present

## 2021-07-16 LAB — POCT I-STAT CREATININE: Creatinine, Ser: 1.4 mg/dL — ABNORMAL HIGH (ref 0.44–1.00)

## 2021-07-16 MED ORDER — IOHEXOL 350 MG/ML SOLN
60.0000 mL | Freq: Once | INTRAVENOUS | Status: AC | PRN
  Administered 2021-07-16: 17:00:00 60 mL via INTRAVENOUS

## 2021-07-17 ENCOUNTER — Other Ambulatory Visit (HOSPITAL_BASED_OUTPATIENT_CLINIC_OR_DEPARTMENT_OTHER): Payer: Self-pay | Admitting: Registered Nurse

## 2021-07-17 DIAGNOSIS — R0789 Other chest pain: Secondary | ICD-10-CM

## 2021-07-17 DIAGNOSIS — R0609 Other forms of dyspnea: Secondary | ICD-10-CM

## 2021-07-20 ENCOUNTER — Other Ambulatory Visit: Payer: Self-pay | Admitting: Internal Medicine

## 2021-07-26 DIAGNOSIS — J019 Acute sinusitis, unspecified: Secondary | ICD-10-CM | POA: Diagnosis not present

## 2021-07-26 DIAGNOSIS — I10 Essential (primary) hypertension: Secondary | ICD-10-CM | POA: Diagnosis not present

## 2021-07-26 DIAGNOSIS — I5022 Chronic systolic (congestive) heart failure: Secondary | ICD-10-CM | POA: Diagnosis not present

## 2021-07-26 DIAGNOSIS — J45909 Unspecified asthma, uncomplicated: Secondary | ICD-10-CM | POA: Diagnosis not present

## 2021-07-27 DIAGNOSIS — M810 Age-related osteoporosis without current pathological fracture: Secondary | ICD-10-CM | POA: Diagnosis not present

## 2021-07-27 DIAGNOSIS — I10 Essential (primary) hypertension: Secondary | ICD-10-CM | POA: Diagnosis not present

## 2021-07-27 DIAGNOSIS — E7800 Pure hypercholesterolemia, unspecified: Secondary | ICD-10-CM | POA: Diagnosis not present

## 2021-07-27 DIAGNOSIS — E039 Hypothyroidism, unspecified: Secondary | ICD-10-CM | POA: Diagnosis not present

## 2021-08-08 ENCOUNTER — Other Ambulatory Visit: Payer: Self-pay

## 2021-08-08 ENCOUNTER — Ambulatory Visit: Payer: Medicare Other | Admitting: Internal Medicine

## 2021-08-08 ENCOUNTER — Encounter: Payer: Self-pay | Admitting: Internal Medicine

## 2021-08-08 VITALS — BP 118/72 | HR 70 | Temp 98.6°F | Ht 66.0 in | Wt 181.6 lb

## 2021-08-08 DIAGNOSIS — J4541 Moderate persistent asthma with (acute) exacerbation: Secondary | ICD-10-CM | POA: Diagnosis not present

## 2021-08-08 MED ORDER — MONTELUKAST SODIUM 10 MG PO TABS: 10.0000 mg | ORAL_TABLET | Freq: Every day | ORAL | 0 refills | Status: DC

## 2021-08-08 MED ORDER — MOMETASONE FURO-FORMOTEROL FUM 200-5 MCG/ACT IN AERO: 2.0000 | INHALATION_SPRAY | Freq: Two times a day (BID) | RESPIRATORY_TRACT | 5 refills | Status: DC

## 2021-08-08 MED ORDER — PREDNISONE 20 MG PO TABS: 40.0000 mg | ORAL_TABLET | Freq: Every day | ORAL | 0 refills | Status: DC

## 2021-08-08 MED ORDER — MONTELUKAST SODIUM 10 MG PO TABS: 10.0000 mg | ORAL_TABLET | Freq: Every day | ORAL | 3 refills | Status: DC

## 2021-08-08 NOTE — Patient Instructions (Signed)
Please schedule follow up scheduled with myself in 3 months.  If my schedule is not open yet, we will contact you with a reminder closer to that time. Please call [phone] if you haven't heard from [place] a month before.   Please increase your dulera to 2 puffs twice a day. Gargle after use.  Take prednisone 40 mg x 5 days.  Restart your singulair.

## 2021-08-08 NOTE — Progress Notes (Signed)
Rachel [name]    [id number]    11-Feb-1951  Primary Care Physician:Shaw, Emily Filbert., MD Date of Appointment: 08/08/2021 Established Patient Visit  Chief complaint:   Chief Complaint  Patient presents with   Follow-up    SOB with any activity    HPI: Rachel [name] is a 71 y.o. woman who presents for follow up for: asthma, GERD.  Interval Updates: Here for follow up after a year and a half.  Was very sob when walking to the office. Having chest tightness, wheezing  Treated with upper respiratory infection with abx in December 2022, no steroids given at that time   Having worsening shortness of breath for the past 2 months.   Needs refill on montelukast. She is out of her montelukast for the past 2 months.   Reflux is also worse. Still on protonix twice daily. Has been this way her whole life.    Current Regimen: albuterol prn - taking about three times/week. Dulera 100 2 puffs twice a day. Montelukast.  Asthma Triggers: anxiety, exertion, seasonal allergies.  Exacerbations in the last year: none requiring prednisone History of hospitalization or intubation: never Hives: none Allergy Testing: a long time ago, doesn't remember.  GERD: yes, on PPI Allergic Rhinitis: yes  Asthma Control Test:  Asthma Control Test ACT Total Score  12/20/2019 22  09/22/2019 18  FeNO: 10 ppb  I have reviewed the patient's family social and past medical history and updated as appropriate.   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 lumpectomy, benign   colonscopy  2011   cystoscopy  2012   JOINT REPLACEMENT  05-2007   right knee replacment/revision   KIDNEY STONE SURGERY     LEFT HEART CATH AND CORONARY ANGIOGRAPHY N/A 02/21/2020   Procedure: LEFT HEART CATH AND CORONARY ANGIOGRAPHY;  Surgeon: Adrian Prows, MD;  Location: [facility];  Service: Cardiovascular;  Laterality: N/A;   REPLACEMENT TOTAL KNEE Left 06/05/12   TOTAL KNEE REVISION  06/03/2012   Procedure: TOTAL KNEE REVISION;  Surgeon: Gearlean Alf, MD;  Location: [facility];  Service: Orthopedics;  Laterality: Left;  Revision of a Left Uni Knee to a Total Knee Arthroplasty    Family History  Problem Relation Age of Onset   Diabetes Mother    Heart failure Mother        CHF   Thyroid disease Mother    Heart failure Father        CHF   Heart attack Brother    Breast cancer Sister    Asthma Sister  Heart failure Sister     Social History   Occupational History   Occupation: retired/disabled    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  ENT: no thrush, mild erythema in oropharynx Resp: ctab no wheezes or crackles CV: RRR no mrg   Data Reviewed: Imaging: Personally reviewed CTPE study Dec 2019 - negative for PE, +mosaic attentuation and air trapping.    PFTs:   PFT Results  Latest Ref Rng & Units 08/20/2019  FVC-Pre L 2.35  FVC-Predicted Pre % 71  FVC-Post L 2.32  FVC-Predicted Post % 70  Pre FEV1/FVC % % 67  Post FEV1/FCV % % 68  FEV1-Pre L 1.56  FEV1-Predicted Pre % 62  FEV1-Post L 1.57  DLCO uncorrected ml/min/mmHg 21.15  DLCO UNC% % 101  DLVA Predicted % 132  TLC L 5.87  TLC % Predicted % 109  RV % Predicted % 141   I have personally reviewed the patient's PFTs and they are notable for moderate airflow limitation without a bronchodilator response.  FeNO 10 ppb - not elevated.   Labs: Lab Results  Component Value Date   WBC 7.4 01/08/2021   HGB 12.0 01/08/2021   HCT 37.1 01/08/2021   MCV 92.1 01/08/2021   PLT 355 01/08/2021    Immunization status: Immunization History  Administered Date(s) Administered   PFIZER(Purple Top)SARS-COV-2 Vaccination 09/19/2019, 10/13/2019   Tdap 04/24/2013   Zoster, Live 10/27/2012    Assessment:  Ms. Escalante is a 71 year old woman with a history of multiple system atrophy with a resting tremor who presents with:   1. Moderate persistent asthma with acute exacerbation - worsening control.  2.  Gastroesophageal reflux disease - worsening control 3.  Allergic Rhinitis - worsening control  Plan/Recommendations: increase dulera to 200 twice a day. Continue prn albuterol Resume montelukast, refilled Continue PPI for GERD Continue flonase Will prescribe prednisone x 5 days for acute exacerbation.   Return to Care: Return in about 3 months (around 11/06/2021).  Lenice Llamas, MD Pulmonary and [place]

## 2021-08-14 ENCOUNTER — Encounter: Payer: Self-pay | Admitting: Cardiology

## 2021-08-14 ENCOUNTER — Ambulatory Visit: Payer: Medicare Other | Admitting: Cardiology

## 2021-08-14 ENCOUNTER — Other Ambulatory Visit: Payer: Self-pay

## 2021-08-14 VITALS — BP 126/61 | HR 72 | Temp 97.9°F | Resp 16 | Ht 66.0 in | Wt 181.2 lb

## 2021-08-14 DIAGNOSIS — I5032 Chronic diastolic (congestive) heart failure: Secondary | ICD-10-CM

## 2021-08-14 DIAGNOSIS — R072 Precordial pain: Secondary | ICD-10-CM

## 2021-08-14 DIAGNOSIS — E782 Mixed hyperlipidemia: Secondary | ICD-10-CM | POA: Diagnosis not present

## 2021-08-14 DIAGNOSIS — I428 Other cardiomyopathies: Secondary | ICD-10-CM

## 2021-08-14 DIAGNOSIS — I447 Left bundle-branch block, unspecified: Secondary | ICD-10-CM

## 2021-08-14 DIAGNOSIS — I130 Hypertensive heart and chronic kidney disease with heart failure and stage 1 through stage 4 chronic kidney disease, or unspecified chronic kidney disease: Secondary | ICD-10-CM

## 2021-08-14 NOTE — Progress Notes (Signed)
Rachel [name] Date of Birth: Nov 01, 1950 MRN: [id number] Primary Care Provider:Shaw, Emily Filbert., MD Primary Cardiologist: Rex Kras, DO, [facility] (established care 03/10/2020)  Date: 08/14/21 Last Office Visit: 04/23/2021  Chief Complaint  Patient presents with   Chest Pain   Follow-up    HPI  Rachel [name] is a 71 y.o.  female who presents to the office with a chief complaint of " reevaluation of chest pain, 21-month follow-up visit. " Patient's past medical history and cardiovascular risk factors include: Chronic systolic and diastolic heart failure/stage C/NYHA class II, nonischemic cardiomyopathy, multiple system atrophy, asthma, history of left lower extremity DVT, hypertension, hyperlipidemia, hypothyroidism, left bundle branch block, advanced age, postmenopausal female.  Patient is accompanied by her husband Rachel [name] at today's office visit for 28-month follow-up for reevaluation of chest pain.  Initially she was seen in July 2021 for symptoms of shortness of breath diagnosed with cardiomyopathy.  She underwent a left heart catheterization which noted normal epicardial coronary arteries.  Her GDMT was uptitrated given the reduced LVEF and repeat echocardiogram notes preserved LVEF.  She presented to the office back in August 2022 for sick visit due to chest pain.  Symptoms appear to be atypical but given her risk factors a nuclear stress test was ordered and pending to reevaluate for CAD.  I did educate her that her symptoms are most likely noncardiac given her other noncardiac comorbid conditions, financial and emotional stress due to family factors.  Since last office visit she continues to have atypical chest discomfort.  She has followed up with pulmonary medicine and was placed on steroids which has helped her symptoms and her inhalers have been uptitrated as well.  She did go to the ED at [facility] back in December for chest pain evaluation.  I do not have those records  available for review but appears that she had a CT of the chest which was overall unremarkable per patient as well as an EKG and she was later sent home.  She denies anginal discomfort or heart failure symptoms.  ALLERGIES: Allergies  Allergen Reactions   Amantadine Hcl     Other reaction(s): Hallucinations   Azithromycin     Other reaction(s): thrush   Crestor [Rosuvastatin]     Other reaction(s): confusion   Ezetimibe     Other reaction(s): disoriented/confusion   Ezetimibe-Simvastatin     Other reaction(s): myalgias   Fosamax [Alendronate] Other (See Comments)    headache   Miacalcin [Calcitonin]     Other reaction(s): headache   Symbicort [Budesonide-Formoterol Fumarate] Other (See Comments)    Shakes and muscle weakness    MEDICATION LIST PRIOR TO VISIT: Current Outpatient Medications on File Prior to Visit  Medication Sig Dispense Refill   albuterol (PROVENTIL) (2.5 MG/3ML) 0.083% nebulizer solution Take 3 mLs (2.5 mg total) by nebulization every 6 (six) hours as needed for wheezing or shortness of breath. 120 mL 11   Carbidopa-Levodopa ER (SINEMET CR) 25-100 MG tablet controlled release TAKE 2 TABLETS BEFORE BREAKFAST;TAKE 1 TABLET BY MOUTH EVERY MORNING WITH BREAKFAST;TAKE 1 TAB BY MOUTH DAILY AT NOON;TAKE 1 TABLET BY MOUTH EVERY EVENING AT DINNER;TAKE 2 TABLETS BY MOUTH DAILY AT BEDTIME 210 tablet 5   citalopram (CELEXA) 20 MG tablet Take 20 mg by mouth daily.     furosemide (LASIX) 20 MG tablet Take 10 mg by mouth daily.     ipratropium (ATROVENT) 0.06 % nasal spray SPRAY 2 SPRAYS IN EACH NOSTRIL EVERY 6 HOURS AS NEEDED  FOR RUNNY NOSE 45 mL 1   levothyroxine (SYNTHROID) 112 MCG tablet Take 112 mcg by mouth daily.     LINZESS 290 MCG CAPS capsule Take 290 mcg by mouth daily as needed.     metoprolol succinate (TOPROL XL) 25 MG 24 hr tablet Take 0.5 tablets (12.5 mg total) by mouth daily. Hold if top blood pressure number less than 100 mmHg or pulse less than 60 bpm. 90  tablet 3   mometasone-formoterol (DULERA) 200-5 MCG/ACT AERO Inhale 2 puffs into the lungs in the morning and at bedtime. 1 each 5   montelukast (SINGULAIR) 10 MG tablet Take 1 tablet (10 mg total) by mouth daily. 30 tablet 0   Multiple Vitamin (MULTIVITAMIN WITH MINERALS) TABS tablet Take 2 tablets by mouth daily.      pantoprazole (PROTONIX) 40 MG tablet Take 1 tablet (40 mg total) by mouth 2 (two) times daily. 180 tablet 1   potassium chloride SA (KLOR-CON) 20 MEQ tablet Take 1 tablet by mouth daily at 12 noon.     pravastatin (PRAVACHOL) 40 MG tablet Take 1 tablet (40 mg total) by mouth daily at 6 PM. 30 tablet 0   sacubitril-valsartan (ENTRESTO) 24-26 MG Take 1 tablet by mouth 2 (two) times daily. 180 tablet 0   VOLTAREN 1 % GEL Apply 1 application topically 4 (four) times daily as needed (pain).      predniSONE (DELTASONE) 20 MG tablet Take 2 tablets (40 mg total) by mouth daily with breakfast. 10 tablet 0   No current facility-administered medications on file prior to visit.    PAST MEDICAL HISTORY: Past Medical History:  Diagnosis Date   Allergic rhinitis    Anemia    Arthritis    Asthma    Blood clot in vein  age 41   left leg   Depression    GERD (gastroesophageal reflux disease)    Hyperlipidemia    Hypertension    pt denies, in physician record   Hypothyroidism    Kidney stones    Multiple pregnancy loss, not currently pregnant    multiple miscarriages-pt.unsure of #   Multiple system atrophy ([place]) 2015   Nephrolithiasis    hx of kidney stones, last 6 months ago   Osteopenia    Osteoporosis    Parkinsonism ([place])    Tendinitis    left foot   Vitamin D deficiency     PAST SURGICAL HISTORY: Past Surgical History:  Procedure Laterality Date   BREAST EXCISIONAL BIOPSY Right    BREAST SURGERY     rt. lumpectomy, benign   colonscopy  2011   cystoscopy  2012   JOINT REPLACEMENT  05-2007   right knee replacment/revision   KIDNEY STONE SURGERY     LEFT HEART  CATH AND CORONARY ANGIOGRAPHY N/A 02/21/2020   Procedure: LEFT HEART CATH AND CORONARY ANGIOGRAPHY;  Surgeon: Adrian Prows, MD;  Location: [facility];  Service: Cardiovascular;  Laterality: N/A;   REPLACEMENT TOTAL KNEE Left 06/05/12   TOTAL KNEE REVISION  06/03/2012   Procedure: TOTAL KNEE REVISION;  Surgeon: Gearlean Alf, MD;  Location: [facility];  Service: Orthopedics;  Laterality: Left;  Revision of a Left Uni Knee to a Total Knee Arthroplasty    FAMILY HISTORY: The patient's family history includes Asthma in her sister; Breast cancer in her sister; Diabetes in her mother; Heart attack in her brother; Heart failure in her father, mother, and sister; Thyroid disease in her mother.   SOCIAL HISTORY:  The patient  reports that she has never smoked. She has never used smokeless tobacco. She reports that she does not drink alcohol and does not use drugs.  Review of Systems  Constitutional: Negative for chills and fever.  HENT:  Negative for hoarse voice and nosebleeds.   Eyes:  Negative for discharge, double vision and pain.  Cardiovascular:  Positive for chest pain (improving). Negative for claudication, dyspnea on exertion, leg swelling, near-syncope, orthopnea, palpitations, paroxysmal nocturnal dyspnea and syncope.  Respiratory:  Positive for shortness of breath (at baseline). Negative for hemoptysis.   Musculoskeletal:  Negative for muscle cramps and myalgias.  Gastrointestinal:  Negative for abdominal pain, constipation, diarrhea, hematemesis, hematochezia, melena, nausea and vomiting.   PHYSICAL EXAM: Vitals with BMI 08/14/2021 08/08/2021 04/25/2021  Height 5\' 6"  5\' 6"  5\' 6"   Weight 181 lbs 3 oz 181 lbs 10 oz 173 lbs  BMI 29.26 16.10 96.04  Systolic 540 981 191  Diastolic 61 72 70  Pulse 72 70 74    CONSTITUTIONAL: Age-appropriate female, presents in a wheelchair, hemodynamically stable, well-nourished. No acute distress.  SKIN: Skin is warm and dry. No rash noted. No cyanosis.  No pallor. No jaundice HEAD: Normocephalic and atraumatic.  EYES: No scleral icterus MOUTH/THROAT: Moist oral membranes.  NECK: No JVD present. No thyromegaly noted. No carotid bruits  LYMPHATIC: No visible cervical adenopathy.  CHEST Normal respiratory effort. No intercostal retractions  LUNGS: Clear to auscultation bilaterally no stridor. No wheezes. No rales.  CARDIOVASCULAR: Regular, positive S1-S2, no murmurs rubs or gallops appreciated. ABDOMINAL: Nonobese, soft, nontender, nondistended, positive bowel sounds in all 4 quadrants no apparent ascites.  EXTREMITIES: No pitting edema.  Brace over her left knee. HEMATOLOGIC: No significant bruising NEUROLOGIC: Oriented to person, place, and time. Nonfocal. Normal muscle tone.  PSYCHIATRIC: Normal mood and affect. Normal behavior. Cooperative  CARDIAC DATABASE: EKG: 03/19/2021: Normal sinus rhythm, 65 bpm, poor R wave progression, without underlying ischemia or injury pattern.  08/14/2021: NSR, 65 bpm, old anteroseptal infarct, without underlying injury pattern.  Echocardiogram: 05/26/2020:  Normal LV systolic function with visual EF 50-55%. Left ventricle cavity is normal in size. Normal global wall motion. Doppler evidence of grade I (impaired) diastolic dysfunction, normal LAP.  Left atrial cavity is mildly dilated.  Mild (Grade I) mitral regurgitation.  Mild to moderate tricuspid regurgitation. No evidence of pulmonary  hypertension. RVSP measures 33 mmHg.  Mild pulmonic regurgitation.  IVC is dilated with a respiratory response of >50%.  Compared to prior study dated 02/17/2020 LVEF has improved from 30-35% to 50-55%, Now grade I diastolic dysfunction, otherwise no other significant change.  Stress Testing:  NA  Heart Catheterization: Left Heart Catheterization 02/21/20:  There is severe left ventricular systolic dysfunction.  The left ventricular ejection fraction is less than 25% by visual estimate. LV end diastolic pressure  is normal. Normal coronary arteries, right dominant circulation.  LABORATORY DATA: CBC Latest Ref Rng & Units 01/08/2021 04/11/2020 02/19/2020  WBC 4.0 - 10.5 K/uL 7.4 5.1 7.5  Hemoglobin 12.0 - 15.0 g/dL 12.0 12.4 11.5(L)  Hematocrit 36.0 - 46.0 % 37.1 37.3 35.4(L)  Platelets 150 - 400 K/uL 355 - 267    CMP Latest Ref Rng & Units 07/16/2021 01/08/2021 05/24/2020  Glucose 70 - 99 mg/dL - 94 84  BUN 8 - 23 mg/dL - 45(H) 22  Creatinine 0.44 - 1.00 mg/dL 1.40(H) 1.69(H) 1.37(H)  Sodium 135 - 145 mmol/L - 138 139  Potassium 3.5 - 5.1 mmol/L - 4.4 4.1  Chloride  98 - 111 mmol/L - 104 101  CO2 22 - 32 mmol/L - 25 26  Calcium 8.9 - 10.3 mg/dL - 8.7(L) 9.1  Total Protein 6.5 - 8.1 g/dL - 6.7 -  Total Bilirubin 0.3 - 1.2 mg/dL - 0.4 -  Alkaline Phos 38 - 126 U/L - 83 -  AST 15 - 41 U/L - 16 -  ALT 0 - 44 U/L - 5 -   Lipid Panel     Component Value Date/Time   CHOL 236 (H) 02/18/2020 0602   TRIG 47 02/18/2020 0602   HDL 65 02/18/2020 0602   CHOLHDL 3.6 02/18/2020 0602   VLDL 9 02/18/2020 0602   LDLCALC 162 (H) 02/18/2020 0602    No results found for: HGBA1C No components found for: NTPROBNP Lab Results  Component Value Date   TSH 1.862 02/18/2020    Cardiac Panel (last 3 results) No results for input(s): CKTOTAL, CKMB, TROPONINIHS, RELINDX in the last 72 hours.  External Labs: Collected: June 09, 2020 available in Care Everywhere. Total cholesterol 155, triglycerides 97, HDL 65, LDL 71, non-HDL 90. Apolipoprotein B 95 (upper limit of normal 90). TSH 2.49. Free T4 1.3.  Hemoglobin 12.7 g/dL, hematocrit 39.2%. AST 16, ALT 6, alkaline phosphatase 104. Sodium 135, potassium 3.6, chloride 98, bicarb 25. BUN 15, creatinine 1.4   IMPRESSION:    ICD-10-CM   1. Precordial pain  R07.2 EKG 12-Lead    2. Chronic heart failure with preserved ejection fraction (HFpEF) (HCC)  I50.32     3. Recovered nonischemic cardiomyopathy ([place])  I42.8     4. Mixed hyperlipidemia  E78.2      5. LBBB (left bundle branch block)  I44.7     6. Hypertensive heart and kidney disease with HF and with CKD stage III (HCC)  I13.0    N18.30        RECOMMENDATIONS: Rachel [name] is a 71 y.o. female whose past medical history and cardiovascular risk factors include: Chronic systolic and diastolic heart failure/stage C/NYHA class II, nonischemic cardiomyopathy, multiple system atrophy, asthma, history of left lower extremity DVT, hypertension, hyperlipidemia, hypothyroidism, left bundle branch block, advanced age, postmenopausal female.  Precordial pain Chronic and stable. Likely noncardiac. EKG: Normal sinus rhythm without underlying injury pattern. Patient had a left heart catheterization in July 2021 which noted no significant epicardial coronary artery disease.  He also had a echocardiogram in October 2021 which noted improvement in LVEF.  Progression of no significant epicardial coronary disease obstructive CAD is less likely.  However given her risk factors and multiple office visits we have ordered the [place] stress test to reevaluate for reversible ischemia. Patient has held off on undergoing stress test as she has remained relatively stable.  Which is clinically appropriate. Will monitor peripherally.  Chronic heart failure with preserved ejection fraction (HFpEF) ([place]) Last heart failure hospitalization in July 2021. Euvolemic on physical examination. Medications reconciled. Reemphasized the importance of low-salt diet. Echo notes improvement in LVEF from 30 to 35% at 50-55%.  Recovered nonischemic cardiomyopathy ([place]) See above  Mixed hyperlipidemia Currently on pravastatin. Last lipid profile from November 2021 available in [place].  Improvement in LDL noted compared to prior. She is scheduled for repeat labs January 2023 prior to her yearly physical in February. She does not endorse any myalgias. Currently managed by primary care provider.  Hypertensive  heart and kidney disease with HF and with CKD stage III ([place]) Office blood pressure is within excellent control. Medications reconciled. Will continue to  follow peripherally.  Patient has done well and is relatively stable from a cardiovascular standpoint.  I would like to see her back in 6 months or sooner if change in clinical status.  The shared decision is to hold off on additional cardiovascular testing at this time.  However, if her symptoms increase in intensity, frequency, and/or duration or she has typical chest discomfort she is advised to go to the ED for further evaluation and management.    FINAL MEDICATION LIST END OF ENCOUNTER: No orders of the defined types were placed in this encounter.    Medications Discontinued During This Encounter  Medication Reason   gabapentin (NEURONTIN) 100 MG capsule    montelukast (SINGULAIR) 10 MG tablet Duplicate      Current Outpatient Medications:    albuterol (PROVENTIL) (2.5 MG/3ML) 0.083% nebulizer solution, Take 3 mLs (2.5 mg total) by nebulization every 6 (six) hours as needed for wheezing or shortness of breath., Disp: 120 mL, Rfl: 11   Carbidopa-Levodopa ER (SINEMET CR) 25-100 MG tablet controlled release, TAKE 2 TABLETS BEFORE BREAKFAST;TAKE 1 TABLET BY MOUTH EVERY MORNING WITH BREAKFAST;TAKE 1 TAB BY MOUTH DAILY AT NOON;TAKE 1 TABLET BY MOUTH EVERY EVENING AT DINNER;TAKE 2 TABLETS BY MOUTH DAILY AT BEDTIME, Disp: 210 tablet, Rfl: 5   citalopram (CELEXA) 20 MG tablet, Take 20 mg by mouth daily., Disp: , Rfl:    furosemide (LASIX) 20 MG tablet, Take 10 mg by mouth daily., Disp: , Rfl:    ipratropium (ATROVENT) 0.06 % nasal spray, SPRAY 2 SPRAYS IN EACH NOSTRIL EVERY 6 HOURS AS NEEDED FOR RUNNY NOSE, Disp: 45 mL, Rfl: 1   levothyroxine (SYNTHROID) 112 MCG tablet, Take 112 mcg by mouth daily., Disp: , Rfl:    LINZESS 290 MCG CAPS capsule, Take 290 mcg by mouth daily as needed., Disp: , Rfl:    metoprolol succinate (TOPROL XL) 25 MG 24 hr  tablet, Take 0.5 tablets (12.5 mg total) by mouth daily. Hold if top blood pressure number less than 100 mmHg or pulse less than 60 bpm., Disp: 90 tablet, Rfl: 3   mometasone-formoterol (DULERA) 200-5 MCG/ACT AERO, Inhale 2 puffs into the lungs in the morning and at bedtime., Disp: 1 each, Rfl: 5   montelukast (SINGULAIR) 10 MG tablet, Take 1 tablet (10 mg total) by mouth daily., Disp: 30 tablet, Rfl: 0   Multiple Vitamin (MULTIVITAMIN WITH MINERALS) TABS tablet, Take 2 tablets by mouth daily. , Disp: , Rfl:    pantoprazole (PROTONIX) 40 MG tablet, Take 1 tablet (40 mg total) by mouth 2 (two) times daily., Disp: 180 tablet, Rfl: 1   potassium chloride SA (KLOR-CON) 20 MEQ tablet, Take 1 tablet by mouth daily at 12 noon., Disp: , Rfl:    pravastatin (PRAVACHOL) 40 MG tablet, Take 1 tablet (40 mg total) by mouth daily at 6 PM., Disp: 30 tablet, Rfl: 0   sacubitril-valsartan (ENTRESTO) 24-26 MG, Take 1 tablet by mouth 2 (two) times daily., Disp: 180 tablet, Rfl: 0   VOLTAREN 1 % GEL, Apply 1 application topically 4 (four) times daily as needed (pain). , Disp: , Rfl:    predniSONE (DELTASONE) 20 MG tablet, Take 2 tablets (40 mg total) by mouth daily with breakfast., Disp: 10 tablet, Rfl: 0  Orders Placed This Encounter  Procedures   EKG 12-Lead    --Continue cardiac medications as reconciled in final medication list. --Return in about 6 months (around 02/11/2022) for Follow up, Chest pain, Chronic HFpEF. Or sooner if needed. --Continue  follow-up with your primary care physician regarding the management of your other chronic comorbid conditions.  Patient's questions and concerns were addressed to her satisfaction. She voices understanding of the instructions provided during this encounter.   This note was created using a voice recognition software as a result there may be grammatical errors inadvertently enclosed that do not reflect the nature of this encounter. Every attempt is made to correct such  errors.  Total time spent: 30 minutes.  Rex Kras, [place], [facility]  Pager: [phone] Office: [phone]

## 2021-08-16 ENCOUNTER — Telehealth: Payer: Self-pay | Admitting: Internal Medicine

## 2021-08-16 NOTE — Telephone Encounter (Signed)
Called and spoke with patient. She verbalized understanding. I offered to place the prednisone on her allergies list but she wants to hold off for now.   Nothing further needed at time of call.

## 2021-08-16 NOTE — Telephone Encounter (Signed)
I have never heard of this happening before, but it is not uncommon to have aches and pains in the muscles/joints after stopping prednisone.   If the symptoms are now resolved and she has no obvious rashes, nothing further to do. We can keep this in mind if we have to give her prednisone again.

## 2021-08-16 NOTE — Telephone Encounter (Signed)
Spoke with the pt  She states she finished the 5 days course of prednisone 40 mg daily on 08/03/21  The following day she had terrible "skin pain from head to toe"  She has had issue with her skin feeling sore before, but not like this  It seems better today, but she asks if the prednisone being abruptly stopped instead of tapered down caused this  She says pred did solve her respiratory symptoms  She denies any rash or discoloration of her skin   Please advise thanks!

## 2021-08-24 DIAGNOSIS — E559 Vitamin D deficiency, unspecified: Secondary | ICD-10-CM | POA: Diagnosis not present

## 2021-08-24 DIAGNOSIS — I10 Essential (primary) hypertension: Secondary | ICD-10-CM | POA: Diagnosis not present

## 2021-08-24 DIAGNOSIS — E039 Hypothyroidism, unspecified: Secondary | ICD-10-CM | POA: Diagnosis not present

## 2021-08-24 DIAGNOSIS — E785 Hyperlipidemia, unspecified: Secondary | ICD-10-CM | POA: Diagnosis not present

## 2021-08-31 DIAGNOSIS — I10 Essential (primary) hypertension: Secondary | ICD-10-CM | POA: Diagnosis not present

## 2021-08-31 DIAGNOSIS — R82998 Other abnormal findings in urine: Secondary | ICD-10-CM | POA: Diagnosis not present

## 2021-08-31 DIAGNOSIS — Z0000 Encounter for general adult medical examination without abnormal findings: Secondary | ICD-10-CM | POA: Diagnosis not present

## 2021-08-31 DIAGNOSIS — I700 Atherosclerosis of aorta: Secondary | ICD-10-CM | POA: Diagnosis not present

## 2021-08-31 DIAGNOSIS — E785 Hyperlipidemia, unspecified: Secondary | ICD-10-CM | POA: Diagnosis not present

## 2021-09-25 DIAGNOSIS — E7800 Pure hypercholesterolemia, unspecified: Secondary | ICD-10-CM | POA: Diagnosis not present

## 2021-09-25 DIAGNOSIS — E039 Hypothyroidism, unspecified: Secondary | ICD-10-CM | POA: Diagnosis not present

## 2021-09-25 DIAGNOSIS — M810 Age-related osteoporosis without current pathological fracture: Secondary | ICD-10-CM | POA: Diagnosis not present

## 2021-09-25 DIAGNOSIS — I10 Essential (primary) hypertension: Secondary | ICD-10-CM | POA: Diagnosis not present

## 2021-10-15 ENCOUNTER — Other Ambulatory Visit: Payer: Self-pay

## 2021-10-15 MED ORDER — SACUBITRIL-VALSARTAN 24-26 MG PO TABS: 1.0000 | ORAL_TABLET | Freq: Two times a day (BID) | ORAL | 3 refills | Status: DC

## 2021-10-16 ENCOUNTER — Telehealth: Payer: Self-pay | Admitting: Neurology

## 2021-10-16 NOTE — Telephone Encounter (Signed)
Patients states she has had some major problems in the past week or so.she is shaking very bad and unable to sleep sit and walk.  ?

## 2021-10-16 NOTE — Telephone Encounter (Signed)
Patient said this has been going on for awhile but has gotten worse over the last week. She is shaking so bad she cant sit still she has to continue to Walk or the muscles between her ankle and foot become so tight it gives her horrible pain. It happens at different times but generally lasts all day once it starts. Patient feels her muscles are so tight from shaking. Patient is taking carbidopa levodopa 25/100 CR 2/1/1/1/2 a day   ?

## 2021-10-16 NOTE — Telephone Encounter (Signed)
Called patient back she is going to call her PCP Dr. Brigitte Pulse to rule out UTI or any blood testing he deems appropriate before patients upcoming appointment in two weeks. ?

## 2021-10-19 DIAGNOSIS — G259 Extrapyramidal and movement disorder, unspecified: Secondary | ICD-10-CM | POA: Diagnosis not present

## 2021-10-19 DIAGNOSIS — I5022 Chronic systolic (congestive) heart failure: Secondary | ICD-10-CM | POA: Diagnosis not present

## 2021-10-19 DIAGNOSIS — G903 Multi-system degeneration of the autonomic nervous system: Secondary | ICD-10-CM | POA: Diagnosis not present

## 2021-10-19 DIAGNOSIS — R1312 Dysphagia, oropharyngeal phase: Secondary | ICD-10-CM | POA: Diagnosis not present

## 2021-10-22 ENCOUNTER — Telehealth: Payer: Self-pay | Admitting: Neurology

## 2021-10-22 NOTE — Telephone Encounter (Signed)
Patient wants to know if tat has recvd her results yet. ?

## 2021-10-22 NOTE — Telephone Encounter (Signed)
I spoke with patient and she is going to follow up with PCP regarding her labs.  ?

## 2021-10-23 DIAGNOSIS — R1312 Dysphagia, oropharyngeal phase: Secondary | ICD-10-CM | POA: Diagnosis not present

## 2021-10-23 DIAGNOSIS — G259 Extrapyramidal and movement disorder, unspecified: Secondary | ICD-10-CM | POA: Diagnosis not present

## 2021-10-23 DIAGNOSIS — E039 Hypothyroidism, unspecified: Secondary | ICD-10-CM | POA: Diagnosis not present

## 2021-10-23 DIAGNOSIS — G903 Multi-system degeneration of the autonomic nervous system: Secondary | ICD-10-CM | POA: Diagnosis not present

## 2021-10-23 NOTE — Progress Notes (Signed)
? ? ?Assessment/Plan:  ? ?1.  MSA ? -I think the biggest issue currently with her increased tremor is that the way she is dosing levodopa.  She takes 2 when she first wakes up (which were intended for 6:30 AM but she is taking them around 9 AM), and then re-doses again 1 hour later, which is when her next dose was actually intended.  I am going to increase her levodopa by 2 tablets/day, but also rework the timing to adjust for her later wake up time.  She will take carbidopa/levodopa 25/100 CR, 2 tablets at 9 AM, 2 tablets at noon, 2 tablets at 3 PM, 1 tablet at 6 PM and 2 tablets at bedtime. ? -I did warn the patient that she is going to likely have some increased dyskinesia from levodopa.  She does state that the dyskinesia really does not bother her.  She has been on amantadine in the past and had some confusion and hallucinations with that.  I did tell her we could try a lower dose of that, but neither the patient nor her husband was really interested in that.  Generally, dyskinesia does not bother patients. ? -Previously scheduled for neurocognitive testing in April, 2021 but canceled.  Can let us know if she changes her mind. ?2.  Diplopia ? -Saw Dr. Hassell Done years ago at [facility].  Diagnosis was congenital large intermittent exotropia/exophoria ?3.  Dysphagia ? -Last MBE in April, 2021.  Recommendations were regular diet, thin liquid ?4.  Weight loss ? -some due to nausea from med but not convinced that all of this is from meds.  F/u pcp ?5.  Low BP ? -pt on metoprolol and entresto.  Metoprolol has been decreased but may need continued decreased.  She is still having dizziness. ? ? ? ?Subjective:  ? ?Rachel [name] was seen today in follow up for MSA.  My previous records were reviewed prior to todays visit as well as outside records available to me.  Last visit, we did start her on low-dose gabapentin for restless leg and neck pain.  This was only 100 mg twice per day.  She states that it didn't help and she  ended up d/c it.   She reports that she is wiggly but that part is not that bothersome.  I did speak to her primary care physician over the phone about her case because she has not been doing well and has visited him.  She called me last week to state that tremor was getting worse.  When she saw him yesterday, he described it as choreiform movements.  He did some blood work.  Her TSH was low, but he felt that that was not the primary issue.  He is adjusting the synthroid and that was called in.  Her PCP is also adjusting her lasxi and it was felt she was a bit dehydrated.  Records from primary care were reviewed.  She states today that the biggest issues are tremor and it is often late evening and bed, but sometimes can be daytime.  Levodopa does help.   ? ?Current prescribed movement disorder medications: ? ?Carbidopa/levodopa 25/100 CR, 2 at 9-10 am (but admits that she may take at 6am)/1(she will take that 1 hour later)/1(she takes it at noon)/1(at 6pm)/2 at bed ? ?Previous medications: Carbidopa/levodopa 25/100 IR; amantadine (stopped because of memory change/hallucinations); gabapentin 100 mg bid (stopped after it didn't help neck pain) ?ALLERGIES:   ?Allergies  ?Allergen Reactions  ? Amantadine Hcl   ?  Other reaction(s): Hallucinations  ? Azithromycin   ?  Other reaction(s): thrush  ? Crestor [Rosuvastatin]   ?  Other reaction(s): confusion  ? Ezetimibe   ?  Other reaction(s): disoriented/confusion  ? Ezetimibe-Simvastatin   ?  Other reaction(s): myalgias  ? Fosamax [Alendronate] Other (See Comments)  ?  headache  ? Miacalcin [Calcitonin]   ?  Other reaction(s): headache  ? Symbicort [Budesonide-Formoterol Fumarate] Other (See Comments)  ?  Shakes and muscle weakness  ? ? ?CURRENT MEDICATIONS:  ?Outpatient Encounter Medications as of 10/24/2021  ?Medication Sig  ? albuterol (PROVENTIL) (2.5 MG/3ML) 0.083% nebulizer solution Take 3 mLs (2.5 mg total) by nebulization every 6 (six) hours as needed for wheezing or  shortness of breath.  ? Carbidopa-Levodopa ER (SINEMET CR) 25-100 MG tablet controlled release TAKE 2 TABLETS BEFORE BREAKFAST;TAKE 1 TABLET BY MOUTH EVERY MORNING WITH BREAKFAST;TAKE 1 TAB BY MOUTH DAILY AT NOON;TAKE 1 TABLET BY MOUTH EVERY EVENING AT DINNER;TAKE 2 TABLETS BY MOUTH DAILY AT BEDTIME  ? citalopram (CELEXA) 20 MG tablet Take 20 mg by mouth daily.  ? furosemide (LASIX) 20 MG tablet Take 10 mg by mouth daily as needed.  ? ipratropium (ATROVENT) 0.06 % nasal spray SPRAY 2 SPRAYS IN EACH NOSTRIL EVERY 6 HOURS AS NEEDED FOR RUNNY NOSE  ? levothyroxine (SYNTHROID) 112 MCG tablet Take 100 mcg by mouth daily.  ? LINZESS 290 MCG CAPS capsule Take 290 mcg by mouth daily as needed.  ? mometasone-formoterol (DULERA) 200-5 MCG/ACT AERO Inhale 2 puffs into the lungs in the morning and at bedtime.  ? montelukast (SINGULAIR) 10 MG tablet Take 1 tablet (10 mg total) by mouth daily.  ? Multiple Vitamin (MULTIVITAMIN WITH MINERALS) TABS tablet Take 2 tablets by mouth daily.   ? pantoprazole (PROTONIX) 40 MG tablet Take 1 tablet (40 mg total) by mouth 2 (two) times daily.  ? potassium chloride SA (KLOR-CON) 20 MEQ tablet Take 1 tablet by mouth daily at 12 noon.  ? pravastatin (PRAVACHOL) 40 MG tablet Take 1 tablet (40 mg total) by mouth daily at 6 PM.  ? predniSONE (DELTASONE) 20 MG tablet Take 2 tablets (40 mg total) by mouth daily with breakfast.  ? sacubitril-valsartan (ENTRESTO) 24-26 MG Take 1 tablet by mouth 2 (two) times daily.  ? VOLTAREN 1 % GEL Apply 1 application topically 4 (four) times daily as needed (pain).   ? metoprolol succinate (TOPROL XL) 25 MG 24 hr tablet Take 0.5 tablets (12.5 mg total) by mouth daily. Hold if top blood pressure number less than 100 mmHg or pulse less than 60 bpm.  ? ?No facility-administered encounter medications on file as of 10/24/2021.  ? ? ?Objective:  ? ?PHYSICAL EXAMINATION:   ? ?VITALS:   ?Vitals:  ? 10/24/21 1021  ?BP: 126/84  ?Pulse: 61  ?SpO2: 97%  ?Weight: 181 lb (82.1  kg)  ?Height: 5\' 6"  (1.676 m)  ? ? ? ?Wt Readings from Last 3 Encounters:  ?10/24/21 181 lb (82.1 kg)  ?08/14/21 181 lb 3.2 oz (82.2 kg)  ?08/08/21 181 lb 9.6 oz (82.4 kg)  ? ? ? ?GEN:  The patient appears stated age and is in NAD. ?HEENT:  Normocephalic, atraumatic.  The mucous membranes are moist.  ? ?Neurological examination: ? ?Orientation: The patient is alert and oriented x3.  She had no problem relating her history. ?Cranial nerves: There is good facial symmetry with min facial hypomimia. The speech is fluent and clear. Soft palate rises symmetrically and there is no tongue  deviation. Hearing is intact to conversational tone. ?Sensation: Sensation is intact to light touch throughout ?Motor: Strength is at least antigravity x4.  ? ?Movement examination: ?Tone: There is nl tone in the UE/LE ?Abnormal movements: there is tremor, primarily in the left leg.  It is intermittent.  She does have some dyskinesia in the left leg too.  That gets worse with distraction. ?Coordination:  There is mild decremation with RAM's ?Gait and Station: She ambulates fairly well with a walker in the hallway.  She is a little unstable without the walker. ? ?I have reviewed and interpreted the following labs independently ? ?  Chemistry   ?   ?Component Value Date/Time  ? NA 138 01/08/2021 1704  ? NA 139 05/24/2020 1631  ? K 4.4 01/08/2021 1704  ? CL 104 01/08/2021 1704  ? CO2 25 01/08/2021 1704  ? BUN 45 (H) 01/08/2021 1704  ? BUN 22 05/24/2020 1631  ? CREATININE 1.40 (H) 07/16/2021 1718  ?    ?Component Value Date/Time  ? CALCIUM 8.7 (L) 01/08/2021 1704  ? ALKPHOS 83 01/08/2021 1936  ? AST 16 01/08/2021 1936  ? ALT 5 01/08/2021 1936  ? BILITOT 0.4 01/08/2021 1936  ?  ? ? ? ?Lab Results  ?Component Value Date  ? WBC 7.4 01/08/2021  ? HGB 12.0 01/08/2021  ? HCT 37.1 01/08/2021  ? MCV 92.1 01/08/2021  ? PLT 355 01/08/2021  ? ? ?Lab Results  ?Component Value Date  ? TSH 1.862 02/18/2020  ? ? ?Total time spent on today's visit was 47  minutes, including both face-to-face time and nonface-to-face time.  Time included that spent on review of records (prior notes available to me/labs/imaging if pertinent), discussing treatment and goals, an

## 2021-10-24 ENCOUNTER — Other Ambulatory Visit: Payer: Self-pay

## 2021-10-24 ENCOUNTER — Ambulatory Visit: Payer: Medicare Other | Admitting: Neurology

## 2021-10-24 ENCOUNTER — Encounter: Payer: Self-pay | Admitting: Neurology

## 2021-10-24 VITALS — BP 126/84 | HR 61 | Ht 66.0 in | Wt 181.0 lb

## 2021-10-24 DIAGNOSIS — G20 Parkinson's disease: Secondary | ICD-10-CM | POA: Diagnosis not present

## 2021-10-24 DIAGNOSIS — G249 Dystonia, unspecified: Secondary | ICD-10-CM | POA: Diagnosis not present

## 2021-10-24 DIAGNOSIS — G232 Striatonigral degeneration: Secondary | ICD-10-CM

## 2021-10-24 MED ORDER — CARBIDOPA-LEVODOPA ER 25-100 MG PO TBCR: EXTENDED_RELEASE_TABLET | ORAL | 5 refills | Status: DC

## 2021-10-30 ENCOUNTER — Ambulatory Visit: Payer: Medicare Other | Admitting: Neurology

## 2021-11-01 ENCOUNTER — Other Ambulatory Visit (HOSPITAL_COMMUNITY): Payer: Self-pay | Admitting: *Deleted

## 2021-11-01 DIAGNOSIS — R1310 Dysphagia, unspecified: Secondary | ICD-10-CM

## 2021-11-05 ENCOUNTER — Ambulatory Visit: Payer: Medicare Other | Attending: Internal Medicine

## 2021-11-05 DIAGNOSIS — R1312 Dysphagia, oropharyngeal phase: Secondary | ICD-10-CM | POA: Insufficient documentation

## 2021-11-05 DIAGNOSIS — R490 Dysphonia: Secondary | ICD-10-CM | POA: Diagnosis not present

## 2021-11-05 NOTE — Patient Instructions (Addendum)
? ? ?  I would like you to have a Modified Barium Swallow exam - to see exactly what is happening in there! ? ?In the meantime - - we tried 10 hard swallows (like you're swallowing a golf ball) but you couldn't catch your breath after three swallows. So do not attempt this at home right now. ?

## 2021-11-05 NOTE — Therapy (Signed)
[place] ?[facility] ?[address], [address] ?[place], [place], [address] ?Phone: [phone]   Fax:  [phone] ? ?Speech Language Pathology Evaluation ? ?Patient Details  ?Name: Pricsilla Lindvall ?MRN: [id number] ?Date of Birth: 1950/09/07 ?Referring Provider (SLP): Marton Redwood, MD ? ? ?Encounter Date: 11/05/2021 ? ? End of Session - 11/05/21 1721   ? ? Visit Number 1   ? Number of Visits 17   ? Date for SLP Re-Evaluation 01/11/22   ? SLP Start Time 1450   ? SLP Stop Time  1533   ? SLP Time Calculation (min) 43 min   ? Activity Tolerance Other (comment)   became anxious at end of session due to couldn't swallow after 3 effortful swallows - hiccups ensued.  ? ?  ?  ? ?  ? ? ?Past Medical History:  ?Diagnosis Date  ? Allergic rhinitis   ? Anemia   ? Arthritis   ? Asthma   ? Blood clot in vein  age 28  ? left leg  ? Depression   ? GERD (gastroesophageal reflux disease)   ? Hyperlipidemia   ? Hypertension   ? pt denies, in physician record  ? Hypothyroidism   ? Kidney stones   ? Multiple pregnancy loss, not currently pregnant   ? multiple miscarriages-pt.unsure of #  ? 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 Nephrolithiasis   ? hx of kidney stones, last 6 months ago  ? Osteopenia   ? Osteoporosis   ? Parkinsonism ([place])   ? Tendinitis   ? left foot  ? Vitamin D deficiency   ? ? ?Past Surgical History:  ?Procedure Laterality Date  ? BREAST EXCISIONAL BIOPSY Right   ? BREAST SURGERY    ? rt. lumpectomy, benign  ? colonscopy  2011  ? cystoscopy  2012  ? JOINT REPLACEMENT  05-2007  ? right knee replacment/revision  ? KIDNEY STONE SURGERY    ? LEFT HEART CATH AND CORONARY ANGIOGRAPHY N/A 02/21/2020  ? Procedure: LEFT HEART CATH AND CORONARY ANGIOGRAPHY;  Surgeon: Adrian Prows, MD;  Location: [facility];  Service: Cardiovascular;  Laterality: N/A;  ? REPLACEMENT TOTAL KNEE Left 06/05/12  ? TOTAL KNEE REVISION  06/03/2012  ? Procedure: TOTAL KNEE REVISION;  Surgeon: Gearlean Alf, MD;  Location:  [facility];  Service: Orthopedics;  Laterality: Left;  Revision of a Left Uni Knee to a Total Knee Arthroplasty  ? ? ?There were no vitals filed for this visit. ? ? Subjective Assessment - 11/05/21 1458   ? ? Subjective "Every time I eat something I get really bad hiccups."   ? Currently in Pain? No/denies   ? ?  ?  ? ?  ? ? ? ? ? SLP Evaluation [facility] - 11/05/21 1458   ? ?  ? SLP Visit Information  ? SLP Received On 11/05/21   ? Referring Provider (SLP) Marton Redwood, MD   ? Onset Date "about 10 years ago"   ? Medical Diagnosis Multiple Systems Atrophy   ?  ? Subjective  ? Patient/Family Stated Goal Ascertain why pt has hiccups "They're violent - they take my breath away."   ?  ? General Information  ? HPI Pt   ?  ? Balance Screen  ? Has the patient fallen in the past 6 months No   ? Has the patient had a decrease in activity level because of a fear of falling?  Yes   ? Is the patient  reluctant to leave their home because of a fear of falling?  Yes   "By myself - yes"  ?  ? Prior Functional Status  ? Cognitive/Linguistic Baseline Within functional limits   ? Type of Home Apartment   ?  Lives With Spouse   ? Available Support Family   ? Vocation Retired   ?  ? Auditory Comprehension  ? Overall Auditory Comprehension Appears within functional limits for tasks assessed   ?  ? Oral Motor/Sensory Function  ? Overall Oral Motor/Sensory Function Impaired   ? Labial ROM Within Functional Limits   ? Labial Symmetry Within Functional Limits   ? Labial Strength Reduced   ? Labial Coordination Reduced   ? Lingual ROM --   reduced upper, to posterior rt and lt teeth  ? Lingual Symmetry Within Functional Limits   ? Lingual Strength Reduced   better rt that lt  ? Lingual Coordination Reduced   ? Velum Within Functional Limits   ?  ? Motor Speech  ? Overall Motor Speech Appears within functional limits for tasks assessed   ? Phonation Hoarse   WNL/WFL volume (upper 60s-low 70s dB)  ? ?  ?  ? ?  ? ?Pt's only reported difficulty - orally  or pharyngeally - was that of pharyngeal stasis with drier or more dense foods (popcorn, some breads, tortilla chips). ?See above for oral-motor assessment. Pt able to achieve spontaneous throat clear, cough, and swallow upon command. She ate cereal bar and applesauce without any overt oral or pharyngeal difficulties. Thyroid elevation appeared adequate and swallows appeared timely. Because pt described drier food items as leading to pharyngeal stasis, SLP inquired to pt three times during her eating of cereal bar - pt denied feeling of pharyngeal stasis each time. With water, pt had one throat clear immediately following sip but pt was clearing throat throughout session - she stated this was due to pollen/post nasal drip. She told SLP she does have coughing throughout the day, not necessarily on her saliva.  ? ? ? ? ? ? ? ? ? ? ? ? ? ? ? SLP Education - 11/05/21 1714   ? ? Education Details Effortful swallows, rationale for MBSS   ? Person(s) Educated Patient;Spouse   ? Methods Explanation;Demonstration   ? Comprehension Verbalized understanding;Returned demonstration   ? ?  ?  ? ?  ? ? ? SLP Short Term Goals - 11/05/21 1717   ? ?  ? SLP SHORT TERM GOAL #1  ? Title pt will use swallow precautions during POs with rare min A in 2 sessions   ? Time 4   ? Period Weeks   ? Status New   ? Target Date 12/14/21   ?  ? SLP SHORT TERM GOAL #2  ? Title pt will complete HEP with occasional min A in 2 sessions   ? Time 4   ? Period Weeks   ? Status New   ? Target Date 12/14/21   ?  ? SLP SHORT TERM GOAL #3  ? Title pt will be administered EAT-10   ? Time 2   ? Period Weeks   ? Status New   ? Target Date 11/23/21   ? ?  ?  ? ?  ? ? ? SLP Long Term Goals - 11/05/21 1720   ? ?  ? SLP LONG TERM GOAL #1  ? Title pt will use swallow precautions during POs with SBA in 2 sessions   ? Time  8   ? Period Weeks   ? Status New   ? Target Date 01/11/22   ?  ? SLP LONG TERM GOAL #2  ? Title pt will demo swallow HEP with rare min A in 2  sessions   ? Time 8   ? Period Weeks   ? Status New   ? Target Date 01/11/22   ?  ? SLP LONG TERM GOAL #3  ? Title pt will improve EAT-10 score   ? Time 8   ? Period Weeks   ? Status New   ? Target Date 01/11/22   ? ?  ?  ? ?  ? ? ? Plan - 11/05/21 1703   ? ? Clinical Impression Statement Pt "Eylin" presents with questionable oropharyngeal dysphagia today c/b throat clears throughout session, and one certain throat clear immediately following swallow thin. Pt also c/o pharyngeal stasis with corn chips, popcorn, and more dense meats like chicken. Pt has MBSS scheduled for 11-16-21. Once that evaluation is completed swallowing therapy may be recommended. If so, SLP will derive goals from that assessment and pt will be seen with frequency and duration below. At that time it will be determined if pt is appropriate for swallowing therapy - if so, pt will benefit from skilled ST focusing on swallowing precaution education and implementation of precautions with POs, and/or mastering a HEP for dysphagia to maximize pt safety with POs. Of pertinent note, in diagnostic therapy today, pt attempted three effortful swallows today and began to have pt stated, "hiccups that took my breath away - I couldn't swallow anything!" Pt became very anxious at this time. Her HEP may have to be exercises which do not include swallowing, or alternate swallow/non-swallow exercises.   ? Speech Therapy Frequency 2x / week   ? Duration 8 weeks   ? Treatment/Interventions Aspiration precaution training;Pharyngeal strengthening exercises;Compensatory techniques;Environmental controls;Diet toleration management by SLP;Trials of upgraded texture/liquids;Patient/family education;Internal/external aids;SLP instruction and feedback   ? Potential to [place]   ? Potential Considerations Medical prognosis   ? Consulted and Agree with Plan of Care Patient   ? ?  ?  ? ?  ? ? ?Patient will benefit from skilled therapeutic intervention in order to  improve the following deficits and impairments:   ?No diagnosis found. ? ? ? ?Problem List ?Patient Active Problem List  ? Diagnosis Date Noted  ? Prolonged Q-T interval on ECG   ? Class 1 obesity due to exce

## 2021-11-07 ENCOUNTER — Other Ambulatory Visit: Payer: Self-pay

## 2021-11-07 MED ORDER — SACUBITRIL-VALSARTAN 24-26 MG PO TABS
1.0000 | ORAL_TABLET | Freq: Two times a day (BID) | ORAL | 3 refills | Status: DC
Start: 2021-11-07 — End: 2022-07-10

## 2021-11-07 MED ORDER — SACUBITRIL-VALSARTAN 24-26 MG PO TABS
1.0000 | ORAL_TABLET | Freq: Two times a day (BID) | ORAL | 3 refills | Status: DC
Start: 2021-11-07 — End: 2021-11-07

## 2021-11-13 DIAGNOSIS — H5202 Hypermetropia, left eye: Secondary | ICD-10-CM | POA: Diagnosis not present

## 2021-11-16 ENCOUNTER — Ambulatory Visit (HOSPITAL_COMMUNITY)
Admission: RE | Admit: 2021-11-16 | Discharge: 2021-11-16 | Disposition: A | Discharge status: Home / Self Care | Payer: Medicare Other | Source: Ambulatory Visit | Attending: Internal Medicine | Admitting: Internal Medicine

## 2021-11-16 DIAGNOSIS — R1310 Dysphagia, unspecified: Secondary | ICD-10-CM | POA: Diagnosis not present

## 2021-11-16 DIAGNOSIS — M1990 Unspecified osteoarthritis, unspecified site: Secondary | ICD-10-CM | POA: Diagnosis not present

## 2021-11-16 DIAGNOSIS — R059 Cough, unspecified: Secondary | ICD-10-CM | POA: Diagnosis not present

## 2021-11-16 DIAGNOSIS — J45909 Unspecified asthma, uncomplicated: Secondary | ICD-10-CM | POA: Insufficient documentation

## 2021-11-16 DIAGNOSIS — G20 Parkinson's disease: Secondary | ICD-10-CM | POA: Insufficient documentation

## 2021-11-16 DIAGNOSIS — I82409 Acute embolism and thrombosis of unspecified deep veins of unspecified lower extremity: Secondary | ICD-10-CM | POA: Insufficient documentation

## 2021-11-16 DIAGNOSIS — N200 Calculus of kidney: Secondary | ICD-10-CM | POA: Insufficient documentation

## 2021-11-16 DIAGNOSIS — D649 Anemia, unspecified: Secondary | ICD-10-CM | POA: Insufficient documentation

## 2021-11-19 ENCOUNTER — Ambulatory Visit: Payer: Medicare Other

## 2021-11-19 DIAGNOSIS — R490 Dysphonia: Secondary | ICD-10-CM

## 2021-11-19 DIAGNOSIS — R1310 Dysphagia, unspecified: Secondary | ICD-10-CM

## 2021-11-19 NOTE — Therapy (Signed)
[place] ?[facility] ?[address], [address] ?[place], [place], [address] ?Phone: [phone]   Fax:  [phone] ? ?Speech Language Pathology Discharge Summary ? ?Patient Details  ?Name: Rachel [name] ?MRN: [id number] ?Date of Birth: 1951-02-10 ?Referring Provider (SLP): Marton Redwood, MD ? ? ?Encounter Date: 11/19/2021 ? ? ? ? ?[place] ? ?Visits from Start of Care: one (eval) ? ?Current functional level related to goals / functional outcomes: ?See below for pt's goals, which are all deferred due to [facility] MBSS exam, and those results are copied below.  ? ?Pt continues with functional oropharyngeal swallow ability. No clinical indications of aspiration/penetration i.e. no cough or throat clearing observed during MBS.  Swallow is strong and timely without retention. She did require extra liquid to orally transit 13 mm barium tablet.  She was observed to consume sequential boluses of thin via straw *x4.  Swallow trigger with liquids was base of tongue. No retention present.   ? Pt reported sensation of barium tablet sticking in her throat - but pharynx was clear. Barium tablet appeared in mid=esophagus and furhter liquid boluses appearead to transit it distally in esophagus. Radiologist not present to confirm findings  ? Of note, pt observed to belch and cough within approx 3 minutes following exam; she denied any reflux symptoms.  ?  ? ?  SLP Visit Diagnosis  ?Dysphagia, unspecified (R13.10)  ?Impact on safety and function Mild aspiration risk  ?  ?Treatment Recommendations No treatment recommended at this time  ?  ?SLP Diet Recommendations Regular solids;Dysphagia 3 (Mech soft) solids;Thin liquid  ?Liquid Administration via Cup;Straw  ? ?  ?Remaining deficits: ?ESOPHAGEAL WORK UP MAY BE WARRANTED - pt reports a longstanding issue with what was described to SLP as her lower esophageal sphincter (pt stated, "the muscle that keeps the esophagus closed at the  stomach"). ?  ?Education / Equipment: ?SLP phoned pt and reviewed MBSS results with her and husband, and suggested she consult with Dr. Brigitte Pulse whether he thinks esophageal assessment is warranted.  ? ?Patient agrees to discharge. Patient goals were  deferred . Patient is being discharged due to  [facility].Marland Kitchen ? ? ? ? ? ? ? ? ? ? ? ? ? SLP Short Term Goals - 11/05/21 1717   ? ?  ? SLP SHORT TERM GOAL #1  ? Title pt will use swallow precautions during POs with rare min A in 2 sessions   ? Time 4   ? Period Weeks   ? Status  Deferred  ? Target Date 12/14/21   ?  ? SLP SHORT TERM GOAL #2  ? Title pt will complete HEP with occasional min A in 2 sessions   ? Time 4   ? Period Weeks   ? Status  Deferred  ? Target Date 12/14/21   ?  ? SLP SHORT TERM GOAL #3  ? Title pt will be administered EAT-10   ? Time 2   ? Period Weeks   ? Status  Deferred  ? Target Date 11/23/21   ? ?  ?  ? ?  ? ? ? SLP Long Term Goals - 11/05/21 1720   ? ?  ? SLP LONG TERM GOAL #1  ? Title pt will use swallow precautions during POs with SBA in 2 sessions   ? Time 8   ? Period Weeks   ? Status Deferred  ? Target Date 01/11/22   ?  ? SLP LONG TERM GOAL #  2  ? Title pt will demo swallow HEP with rare min A in 2 sessions   ? Time 8   ? Period Weeks   ? Status Deferred  ? Target Date 01/11/22   ?  ? SLP LONG TERM GOAL #3  ? Title pt will improve EAT-10 score   ? Time 8   ? Period Weeks   ? Status Deferred  ? Target Date 01/11/22   ? ?  ?  ? ?  ? ? ? ? ?Patient will benefit from skilled therapeutic intervention in order to improve the following deficits and impairments:   ?Dysphagia, unspecified ?Hoarseness ? ? ? ?Problem List ?Patient Active Problem List  ? Diagnosis Date Noted  ? Prolonged Q-T interval on ECG   ? Class 1 obesity due to excess calories with serious comorbidity and body mass index (BMI) of 34.0 to 34.9 in adult   ? Community acquired pneumonia of right lower lobe of lung 02/17/2020  ? Mild intermittent asthma with (acute) exacerbation 02/17/2020   ? Acute respiratory failure with hypoxia ([place]) 02/17/2020  ? Acute combined systolic and diastolic heart failure ([place])   ? Cardiomyopathy ([place])   ? Type 2 myocardial infarction [facility])   ? Elevated troponin   ? Sinus tachycardia   ? LBBB (left bundle branch block)   ? Shortness of breath   ? Hypertensive heart and kidney disease with HF and with CKD stage I-IV ([place])   ? Mixed hyperlipidemia   ? Hx of deep venous thrombosis   ? 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 Thrush 06/15/2012  ? S/P total knee arthroplasty 06/15/2012  ? Fall 06/13/2012  ? Urinary incontinence 06/13/2012  ? Postop Acute blood loss anemia 06/04/2012  ? Postop Hypokalemia 06/04/2012  ? Pain due to unicompartmental arthroplasty of knee ([place]) 06/03/2012  ? GERD 03/18/2008  ? GI BLEED 03/18/2008  ? DYSPHAGIA 03/18/2008  ? FECAL OCCULT BLOOD 03/18/2008  ? ? ?[place], [place] ?11/19/2021, 3:14 PM ? ?[place] ?[facility] ?[address], [address] ?[place], [place], [address] ?Phone: [phone]   Fax:  [phone] ? ? ?Name: Rachel [name] ?MRN: [id number] ?Date of Birth: Dec 05, 1950 ? ?

## 2021-11-21 ENCOUNTER — Ambulatory Visit: Payer: Medicare Other

## 2021-11-26 ENCOUNTER — Ambulatory Visit: Payer: Medicare Other | Admitting: Internal Medicine

## 2021-12-18 ENCOUNTER — Encounter (HOSPITAL_BASED_OUTPATIENT_CLINIC_OR_DEPARTMENT_OTHER): Payer: Self-pay | Admitting: Emergency Medicine

## 2021-12-18 ENCOUNTER — Other Ambulatory Visit: Payer: Self-pay

## 2021-12-18 ENCOUNTER — Emergency Department (HOSPITAL_BASED_OUTPATIENT_CLINIC_OR_DEPARTMENT_OTHER)
Admission: EM | Admit: 2021-12-18 | Discharge: 2021-12-19 | Disposition: A | Discharge status: Home / Self Care | Payer: Medicare Other | Attending: Emergency Medicine | Admitting: Emergency Medicine

## 2021-12-18 DIAGNOSIS — G20 Parkinson's disease: Secondary | ICD-10-CM | POA: Diagnosis not present

## 2021-12-18 DIAGNOSIS — Z7951 Long term (current) use of inhaled steroids: Secondary | ICD-10-CM | POA: Diagnosis not present

## 2021-12-18 DIAGNOSIS — I5041 Acute combined systolic (congestive) and diastolic (congestive) heart failure: Secondary | ICD-10-CM | POA: Insufficient documentation

## 2021-12-18 DIAGNOSIS — N189 Chronic kidney disease, unspecified: Secondary | ICD-10-CM | POA: Diagnosis not present

## 2021-12-18 DIAGNOSIS — E039 Hypothyroidism, unspecified: Secondary | ICD-10-CM | POA: Diagnosis not present

## 2021-12-18 DIAGNOSIS — K5900 Constipation, unspecified: Secondary | ICD-10-CM | POA: Diagnosis not present

## 2021-12-18 DIAGNOSIS — I10 Essential (primary) hypertension: Secondary | ICD-10-CM | POA: Diagnosis not present

## 2021-12-18 DIAGNOSIS — I130 Hypertensive heart and chronic kidney disease with heart failure and stage 1 through stage 4 chronic kidney disease, or unspecified chronic kidney disease: Secondary | ICD-10-CM | POA: Insufficient documentation

## 2021-12-18 DIAGNOSIS — J4520 Mild intermittent asthma, uncomplicated: Secondary | ICD-10-CM | POA: Diagnosis not present

## 2021-12-18 DIAGNOSIS — Z79899 Other long term (current) drug therapy: Secondary | ICD-10-CM | POA: Insufficient documentation

## 2021-12-18 DIAGNOSIS — R1084 Generalized abdominal pain: Secondary | ICD-10-CM | POA: Diagnosis present

## 2021-12-18 DIAGNOSIS — R1031 Right lower quadrant pain: Secondary | ICD-10-CM | POA: Diagnosis not present

## 2021-12-18 DIAGNOSIS — R109 Unspecified abdominal pain: Secondary | ICD-10-CM

## 2021-12-18 LAB — CBC
HCT: 35 % — ABNORMAL LOW (ref 36.0–46.0)
Hemoglobin: 11.8 g/dL — ABNORMAL LOW (ref 12.0–15.0)
MCH: 30.2 pg (ref 26.0–34.0)
MCHC: 33.7 g/dL (ref 30.0–36.0)
MCV: 89.5 fL (ref 80.0–100.0)
Platelets: 288 10*3/uL (ref 150–400)
RBC: 3.91 MIL/uL (ref 3.87–5.11)
RDW: 13 % (ref 11.5–15.5)
WBC: 5.7 10*3/uL (ref 4.0–10.5)
nRBC: 0 % (ref 0.0–0.2)

## 2021-12-18 NOTE — ED Triage Notes (Signed)
Pt c/o cramping abdominal pain x 2 hours. Denies N/V/D

## 2021-12-19 ENCOUNTER — Emergency Department (HOSPITAL_BASED_OUTPATIENT_CLINIC_OR_DEPARTMENT_OTHER): Payer: Medicare Other | Admitting: Radiology

## 2021-12-19 ENCOUNTER — Emergency Department (HOSPITAL_BASED_OUTPATIENT_CLINIC_OR_DEPARTMENT_OTHER): Payer: Medicare Other

## 2021-12-19 DIAGNOSIS — I700 Atherosclerosis of aorta: Secondary | ICD-10-CM | POA: Diagnosis not present

## 2021-12-19 DIAGNOSIS — R1084 Generalized abdominal pain: Secondary | ICD-10-CM | POA: Diagnosis not present

## 2021-12-19 DIAGNOSIS — R109 Unspecified abdominal pain: Secondary | ICD-10-CM | POA: Diagnosis not present

## 2021-12-19 LAB — URINALYSIS, ROUTINE W REFLEX MICROSCOPIC
Bilirubin Urine: NEGATIVE
Glucose, UA: NEGATIVE mg/dL
Hgb urine dipstick: NEGATIVE
Ketones, ur: NEGATIVE mg/dL
Nitrite: NEGATIVE
Protein, ur: NEGATIVE mg/dL
Specific Gravity, Urine: 1.04 — ABNORMAL HIGH (ref 1.005–1.030)
pH: 5.5 (ref 5.0–8.0)

## 2021-12-19 LAB — COMPREHENSIVE METABOLIC PANEL
ALT: <5 U/L (ref 0–44)
AST: 16 U/L (ref 15–41)
Albumin: 4.2 g/dL (ref 3.5–5.0)
Alkaline Phosphatase: 78 U/L (ref 38–126)
Anion gap: 12 (ref 5–15)
BUN: 19 mg/dL (ref 8–23)
CO2: 24 mmol/L (ref 22–32)
Calcium: 9.3 mg/dL (ref 8.9–10.3)
Chloride: 104 mmol/L (ref 98–111)
Creatinine, Ser: 1.1 mg/dL — ABNORMAL HIGH (ref 0.44–1.00)
GFR, Estimated: 54 mL/min — ABNORMAL LOW (ref >60)
Glucose, Bld: 85 mg/dL (ref 70–99)
Potassium: 4.3 mmol/L (ref 3.5–5.1)
Sodium: 140 mmol/L (ref 135–145)
Total Bilirubin: 0.6 mg/dL (ref 0.3–1.2)
Total Protein: 6.7 g/dL (ref 6.5–8.1)

## 2021-12-19 LAB — LIPASE, BLOOD: Lipase: 16 U/L (ref 11–51)

## 2021-12-19 LAB — TROPONIN I (HIGH SENSITIVITY)
Troponin I (High Sensitivity): 5 ng/L (ref <18)
Troponin I (High Sensitivity): 5 ng/L (ref <18)

## 2021-12-19 MED ORDER — SODIUM CHLORIDE 0.9 % IV BOLUS
1000.0000 mL | Freq: Once | INTRAVENOUS | Status: AC
  Administered 2021-12-19: 1000 mL via INTRAVENOUS

## 2021-12-19 MED ORDER — IOHEXOL 300 MG/ML  SOLN
100.0000 mL | Freq: Once | INTRAMUSCULAR | Status: AC | PRN
  Administered 2021-12-19: 75 mL via INTRAVENOUS

## 2021-12-19 MED ORDER — HYDROMORPHONE HCL 1 MG/ML IJ SOLN
0.5000 mg | Freq: Once | INTRAMUSCULAR | Status: AC
Start: 2021-12-19 — End: 2021-12-19
  Administered 2021-12-19: 0.5 mg via INTRAVENOUS
  Filled 2021-12-19: qty 1

## 2021-12-19 MED ORDER — HYOSCYAMINE SULFATE 0.125 MG SL SUBL
0.1250 mg | SUBLINGUAL_TABLET | Freq: Once | SUBLINGUAL | Status: AC
  Administered 2021-12-19: 0.125 mg via SUBLINGUAL
  Filled 2021-12-19: qty 1

## 2021-12-19 MED ORDER — SODIUM CHLORIDE 0.9 % IV BOLUS: 1000.0000 mL | Freq: Once | INTRAVENOUS | Status: DC

## 2021-12-19 NOTE — ED Notes (Signed)
IVP pain med being given when Xray tech to bedside to transport pt to xray dept -- Pt had muscular condition which is contributing to restlessness/jerking motions -- per xray pt will need to be able to hold still for imaging -- xray to give time for pain medication to work and will return

## 2021-12-19 NOTE — ED Provider Notes (Signed)
[facility] EMERGENCY DEPT Provider Note  CSN: [id number] Arrival date & time: 12/18/21 2332  Chief Complaint(s) Abdominal Pain  HPI Rachel [name] is a 71 y.o. female     Abdominal Pain Pain location:  Generalized Pain quality: aching and stabbing   Pain radiates to:  Does not radiate Pain severity:  Severe Onset quality:  Gradual Duration:  2 hours Timing:  Constant Progression:  Waxing and waning Chronicity:  Recurrent Relieved by:  Nothing Worsened by:  Movement Associated symptoms: constipation and flatus   Associated symptoms: no chest pain, no diarrhea, no dysuria, no fatigue and no hematochezia    Past Medical History Past Medical History:  Diagnosis Date   Allergic rhinitis    Anemia    Arthritis    Asthma    Blood clot in vein  age 71   left leg   Depression    GERD (gastroesophageal reflux disease)    Hyperlipidemia    Hypertension    pt denies, in physician record   Hypothyroidism    Kidney stones    Multiple pregnancy loss, not currently pregnant    multiple miscarriages-pt.unsure of #   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   Nephrolithiasis    hx of kidney stones, last 6 months ago   Osteopenia    Osteoporosis    Parkinsonism ([place])    Tendinitis    left foot   Vitamin D deficiency    Patient Active Problem List   Diagnosis Date Noted   Prolonged Q-T interval on ECG    Class 1 obesity due to excess calories with serious comorbidity and body mass index (BMI) of 34.0 to 34.9 in adult    Community acquired pneumonia of right lower lobe of lung 02/17/2020   Mild intermittent asthma with (acute) exacerbation 02/17/2020   Acute respiratory failure with hypoxia ([place]) 02/17/2020   Acute combined systolic and diastolic heart failure (HCC)    Cardiomyopathy (HCC)    Type 2 myocardial infarction (HCC)    Elevated troponin    Sinus tachycardia    LBBB (left bundle branch block)    Shortness of breath    Hypertensive heart and kidney  disease with HF and with CKD stage I-IV ([place])    Mixed hyperlipidemia    Hx of deep venous thrombosis    Multiple system atrophy, 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 Authorizing Provider  albuterol (PROVENTIL) (2.5 MG/3ML) 0.083% nebulizer solution Take 3 mLs (2.5 mg total) by nebulization every 6 (six) hours as needed for wheezing or shortness of breath. 02/07/20   Magdalen Spatz, NP  Carbidopa-Levodopa ER (SINEMET CR) 25-100 MG tablet controlled release 2 at 9am, 2 at noon, 2 at 3pm, 1 at 6pm, 2 q hs 10/24/21   Tat, Rebecca S, DO  citalopram (CELEXA) 20 MG tablet Take 20 mg by mouth daily. 12/13/19   [provider]  furosemide (LASIX) 20 MG tablet Take 10 mg by mouth daily as needed. 03/12/21   [provider]  ipratropium (ATROVENT) 0.06 % nasal spray SPRAY  2 SPRAYS IN EACH NOSTRIL EVERY 6 HOURS AS NEEDED FOR RUNNY NOSE 06/21/20   Kozlow, Donnamarie Poag, MD  levothyroxine (SYNTHROID) 112 MCG tablet Take 100 mcg by mouth daily. 11/09/19   [provider]  LINZESS 290 MCG CAPS capsule Take 290 mcg by mouth daily as needed. 10/20/19   [provider]  metoprolol succinate (TOPROL XL) 25 MG 24 hr tablet Take 0.5 tablets (12.5 mg total) by mouth daily. Hold if top blood pressure number less than 100 mmHg or pulse less than 60 bpm. 05/07/21 08/14/21  Tolia, Sunit, DO  mometasone-formoterol (DULERA) 200-5 MCG/ACT AERO Inhale 2 puffs into the lungs in the morning and at bedtime. 08/08/21   Spero Geralds, MD  montelukast  (SINGULAIR) 10 MG tablet Take 1 tablet (10 mg total) by mouth daily. 08/08/21   Spero Geralds, MD  Multiple Vitamin (MULTIVITAMIN WITH MINERALS) TABS tablet Take 2 tablets by mouth daily.     [provider]  pantoprazole (PROTONIX) 40 MG tablet Take 1 tablet (40 mg total) by mouth 2 (two) times daily. 06/21/20   Kozlow, Donnamarie Poag, MD  potassium chloride SA (KLOR-CON) 20 MEQ tablet Take 1 tablet by mouth daily at 12 noon.    [provider]  pravastatin (PRAVACHOL) 40 MG tablet Take 1 tablet (40 mg total) by mouth daily at 6 PM. 02/21/20   Amin, Jeanella Flattery, MD  predniSONE (DELTASONE) 20 MG tablet Take 2 tablets (40 mg total) by mouth daily with breakfast. 08/08/21   Spero Geralds, MD  sacubitril-valsartan (ENTRESTO) 24-26 MG Take 1 tablet by mouth 2 (two) times daily. 11/07/21   Tolia, Sunit, DO  VOLTAREN 1 % GEL Apply 1 application topically 4 (four) times daily as needed (pain).  01/25/14   [provider]                                                                                                                                    Allergies Amantadine hcl, Azithromycin, Crestor [rosuvastatin], Ezetimibe, Ezetimibe-simvastatin, Fosamax [alendronate], Miacalcin [calcitonin], and Symbicort [budesonide-formoterol fumarate]  Review of Systems Review of Systems  Constitutional:  Negative for fatigue.  Cardiovascular:  Negative for chest pain.  Gastrointestinal:  Positive for abdominal pain, constipation and flatus. Negative for diarrhea and hematochezia.  Genitourinary:  Negative for dysuria.  As noted in HPI  Physical Exam Vital Signs  I have reviewed the triage vital signs BP 135/71   Pulse 87   Temp 97.6 F (36.4 C) (Tympanic)   Resp 18   Ht 5\' 7"  (1.702 m)   Wt 78.9 kg   SpO2 96%   BMI 27.25 kg/m   Physical Exam Vitals reviewed.  Constitutional:      General: She is not in acute distress.    Appearance: She is well-developed. She is not diaphoretic.   HENT:     Head: Normocephalic and atraumatic.     Right  Ear: External ear normal.     Left Ear: External ear normal.     Nose: Nose normal.  Eyes:     General: No scleral icterus.    Conjunctiva/sclera: Conjunctivae normal.  Neck:     Trachea: Phonation normal.  Cardiovascular:     Rate and Rhythm: Normal rate and regular rhythm.  Pulmonary:     Effort: Pulmonary effort is normal. No respiratory distress.     Breath sounds: No stridor.  Abdominal:     General: There is no distension.     Tenderness: There is generalized abdominal tenderness. There is no guarding or rebound.     Hernia: No hernia is present.  Musculoskeletal:        General: Normal range of motion.     Cervical back: Normal range of motion.  Neurological:     Mental Status: She is alert and oriented to person, place, and time.     Motor: Tremor present.  Psychiatric:        Behavior: Behavior normal.    ED Results and Treatments Labs (all labs ordered are listed, but only abnormal results are displayed) Labs Reviewed  COMPREHENSIVE METABOLIC PANEL - Abnormal; Notable for the following components:      Result Value   Creatinine, Ser 1.10 (*)    GFR, Estimated 54 (*)    All other components within normal limits  CBC - Abnormal; Notable for the following components:   Hemoglobin 11.8 (*)    HCT 35.0 (*)    All other components within normal limits  URINALYSIS, ROUTINE W REFLEX MICROSCOPIC - Abnormal; Notable for the following components:   Specific Gravity, Urine 1.040 (*)    Leukocytes,Ua SMALL (*)    All other components within normal limits  LIPASE, BLOOD  TROPONIN I (HIGH SENSITIVITY)  TROPONIN I (HIGH SENSITIVITY)                                                                                                                         EKG  EKG Interpretation  Date/Time:  Wednesday Dec 19 2021 02:11:12 EDT Ventricular Rate:  86 PR Interval:  173 QRS Duration: 132 QT Interval:  440 QTC  Calculation: 527 R Axis:   68 Text Interpretation: Sinus rhythm IVCD, consider atypical LBBB Confirmed by Addison Lank [phone]) on 12/19/2021 5:26:34 AM       Radiology CT ABDOMEN PELVIS W CONTRAST  Result Date: 12/19/2021 CLINICAL DATA:  Right lower quadrant abdominal pain EXAM: CT ABDOMEN AND PELVIS WITH CONTRAST TECHNIQUE: Multidetector CT imaging of the abdomen and pelvis was performed using the standard protocol following bolus administration of intravenous contrast. RADIATION DOSE REDUCTION: This exam was performed according to the departmental dose-optimization program which includes automated exposure control, adjustment of the mA and/or kV according to patient size and/or use of iterative reconstruction technique. CONTRAST:  69mL OMNIPAQUE IOHEXOL 300 MG/ML  SOLN COMPARISON:  None Available. FINDINGS: Lower chest: No acute abnormality. Hepatobiliary: No focal liver abnormality  is seen. No gallstones, gallbladder wall thickening, or biliary dilatation. Pancreas: Unremarkable Spleen: Unremarkable Adrenals/Urinary Tract: Adrenal glands are unremarkable. Kidneys are normal, without renal calculi, focal lesion, or hydronephrosis. Bladder is unremarkable. Stomach/Bowel: Moderate sigmoid diverticulosis without superimposed acute inflammatory change. Moderate stool throughout the colon without evidence of obstruction. Stomach, small bowel, and large bowel are otherwise unremarkable. The appendix is not clearly identified, however, no secondary signs of appendicitis are seen within the right lower quadrant. No free intraperitoneal gas or fluid. Vascular/Lymphatic: Aortic atherosclerosis. Retroaortic left renal vein. No enlarged abdominal or pelvic lymph nodes. Reproductive: Uterus and bilateral adnexa are unremarkable. Other: No abdominal wall hernia.  Rectum unremarkable. Musculoskeletal: Degenerative changes seen within the lumbar spine. No acute bone abnormality. Remote superior endplate fracture of L4.  No lytic or blastic bone lesion. IMPRESSION: No acute intra-abdominal pathology identified. No definite radiographic explanation for the patient's reported symptoms. Moderate sigmoid diverticulosis without superimposed acute inflammatory change. Moderate stool burden without evidence of obstruction. Aortic Atherosclerosis (ICD10-I70.0). Electronically Signed   By: Fidela Salisbury M.D.   On: 12/19/2021 02:14   DG ABD ACUTE 2+V W 1V CHEST  Result Date: 12/19/2021 CLINICAL DATA:  Abdominal cramping. EXAM: DG ABDOMEN ACUTE WITH 1 VIEW CHEST COMPARISON:  January 08, 2021 FINDINGS: There is a nonspecific bowel gas pattern with multiple air-filled loops of large and small bowel noted throughout the abdomen. A moderate to marked amount of stool is seen within the descending and sigmoid colon. No radiopaque calculi or other significant radiographic abnormality is seen. Heart size and mediastinal contours are within normal limits. Diffuse, chronic appearing increased interstitial lung markings are seen without focal consolidation, pleural effusion or pneumothorax. IMPRESSION: 1. Nonspecific bowel gas pattern, as described above. An early partial small bowel obstruction versus ileus cannot be excluded. Correlation with abdomen pelvis CT is recommended. 2. Chronic appearing increased interstitial lung markings without acute or active cardiopulmonary disease. Electronically Signed   By: Virgina Norfolk M.D.   On: 12/19/2021 01:26    Pertinent labs & imaging results that were available during my care of the patient were reviewed by me and considered in my medical decision making (see MDM for details).  Medications Ordered in ED Medications  HYDROmorphone (DILAUDID) injection 0.5 mg (0.5 mg Intravenous Given 12/19/21 0040)  hyoscyamine (LEVSIN SL) SL tablet 0.125 mg (0.125 mg Sublingual Given 12/19/21 0040)  iohexol (OMNIPAQUE) 300 MG/ML solution 100 mL (75 mLs Intravenous Contrast Given 12/19/21 0156)  HYDROmorphone  (DILAUDID) injection 0.5 mg (0.5 mg Intravenous Given 12/19/21 0209)  sodium chloride 0.9 % bolus 1,000 mL (0 mLs Intravenous Stopped 12/19/21 0259)                                                                                                                                     Procedures Procedures  (including critical care time)  Medical Decision Making / ED Course    Complexity of Problem:  Co-morbidities/SDOH that complicate the patient evaluation/care: Multisystem atrophy, congestive heart failure with a last EF of 50 to 55% on Entresto, IBS-C on Linzess  Patient's presenting problem/concern, DDX, and MDM listed below: Abdominal discomfort Considering functional abdominal discomfort related to constipation Given age and comorbidities will assess for any serious intra-abdominal inflammatory/infectious process or bowel obstruction We will also assess for urinary tract infection  Hospitalization Considered:  Yes  Initial Intervention:  Small IV fluid boluses, IV pain medicine, and Levsin    Complexity of Data:   Cardiac Monitoring: The patient was maintained on a cardiac monitor.   I personally viewed and interpreted the cardiac monitored which showed an underlying rhythm of normal sinus rhythm with rates in the 70s to 80s EKG without acute ischemic changes or evidence of pericarditis  Laboratory Tests ordered listed below with my independent interpretation: CBC without leukocytosis.  Stable hemoglobin No significant electrolyte derangement.  Mild renal insufficiency. No evidence of biliary obstruction or pancreatitis. UA not convincing for urinary tract infection   Imaging Studies ordered listed below with my independent interpretation: Acute abdominal series with possible incomplete small bowel obstruction versus ileus CT scan without evidence of bowel obstruction or infectious/inflammatory process.  Patient does have moderate stool burden throughout.     ED  Course:    Assessment, Add'l Intervention, and Reassessment: Abdominal discomfort Improved with treatment above  She is able to tolerate oral intake After returning from CT scan patient developed mild chest discomfort Cardiac work-up was obtained and revealed stable EKG without acute ischemic changes Serial troponins were negative x2 Pain resolved without intervention  Final Clinical Impression(s) / ED Diagnoses Final diagnoses:  Abdominal discomfort  Constipation, unspecified constipation type   The patient appears reasonably screened and/or stabilized for discharge and I doubt any other medical condition or other [facility] requiring further screening, evaluation, or treatment in the ED at this time prior to discharge. Safe for discharge with strict return precautions.  Disposition: Discharge  Condition: Good  I have discussed the results, Dx and Tx plan with the patient/family who expressed understanding and agree(s) with the plan. Discharge instructions discussed at length. The patient/family was given strict return precautions who verbalized understanding of the instructions. No further questions at time of discharge.    ED Discharge Orders     None       Follow Up: Ginger Organ., MD [place] [phone]  Call  as needed           This chart was dictated using voice recognition software.  Despite best efforts to proofread,  errors can occur which can change the documentation meaning.    Fatima Blank, MD 12/19/21 [phone]

## 2021-12-19 NOTE — ED Notes (Signed)
Patient now lying calm, quiet and still in bed --- now being transported to X-ray via stretcher; no acute distress noted

## 2021-12-19 NOTE — ED Notes (Signed)
Pt agreeable with d/c plan as discussed by provider- this nurse has verbally reinforced d/c instructions and provided pt with written copy- pt acknowledges verbal understanding and denies any additional questions, concerns, needs- pt escorted to exit via w/c; able to xfer independently into passenger seat of vehicle independently; being transported home by spouse

## 2021-12-19 NOTE — ED Notes (Signed)
ED Provider at bedside. 

## 2021-12-19 NOTE — ED Notes (Signed)
ED Provider at bedside.  Pt now provided with 8 oz ice water per provider order

## 2021-12-20 ENCOUNTER — Encounter: Payer: Self-pay | Admitting: Internal Medicine

## 2021-12-20 ENCOUNTER — Ambulatory Visit: Payer: Medicare Other | Admitting: Internal Medicine

## 2021-12-20 VITALS — BP 122/64 | HR 74 | Temp 98.4°F | Ht 66.0 in | Wt 176.0 lb

## 2021-12-20 DIAGNOSIS — J301 Allergic rhinitis due to pollen: Secondary | ICD-10-CM | POA: Diagnosis not present

## 2021-12-20 DIAGNOSIS — J4540 Moderate persistent asthma, uncomplicated: Secondary | ICD-10-CM | POA: Diagnosis not present

## 2021-12-20 MED ORDER — SPIRIVA RESPIMAT 1.25 MCG/ACT IN AERS: 2.0000 | INHALATION_SPRAY | Freq: Every day | RESPIRATORY_TRACT | 5 refills | Status: DC

## 2021-12-20 MED ORDER — SPIRIVA RESPIMAT 1.25 MCG/ACT IN AERS: 2.0000 | INHALATION_SPRAY | Freq: Every day | RESPIRATORY_TRACT | 2 refills | Status: DC

## 2021-12-20 NOTE — Progress Notes (Signed)
Rachel [name]    [id number]    March 19, 1951  Primary Care Physician:Shaw, Emily Filbert., MD Date of Appointment: 12/20/2021 Established Patient Visit  Chief complaint:   Chief Complaint  Patient presents with   Follow-up    Pt is here for follow up for her asthma. Pt states she feels like her asthma is getting worse. Pt states when she is doing more activity she does sound like she has some stridor present.     HPI: Rachel [name] is a 71 y.o. woman who presents for follow up for: asthma, GERD.  Interval Updates: Here for follow up after 4 months. Feels that asthma is getting worse. At last visit I increased her dulera to high dose 200 2 puffs twice a day.   Taking albuterol 3-4 times/week.   She is having itching and wants to know if its related to montelukast.   She doesn't feel like she can do PFTs. Anymore. She had difficulty doing it last time. Feels her multisystem atrophy symptoms are worsening.  Husband notes dyspnea and wheezing with exertion around the house.   Current Regimen: albuterol prn - taking about three times/week. Dulera 200 2 puffs twice a day. Montelukast.  Asthma Triggers: anxiety, exertion, seasonal allergies.  Exacerbations in the last year: 2 History of hospitalization or intubation: never Hives: none Allergy Testing: a long time ago, doesn't remember.  GERD: yes, on PPI Allergic Rhinitis: yes  Asthma Control Test:  Asthma Control Test ACT Total Score  12/20/2021  3:43 PM 14  12/20/2019  9:46 AM 22  09/22/2019  1:59 PM 18  FeNO: 10 ppb  I have reviewed the patient's family social and past medical history and updated as appropriate.   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 lumpectomy, benign   colonscopy  2011   cystoscopy  2012   JOINT REPLACEMENT  05-2007   right knee replacment/revision   KIDNEY STONE SURGERY     LEFT HEART CATH AND CORONARY ANGIOGRAPHY N/A 02/21/2020   Procedure: LEFT HEART CATH AND CORONARY ANGIOGRAPHY;  Surgeon: Adrian Prows, MD;  Location: [facility];  Service: Cardiovascular;  Laterality: N/A;   REPLACEMENT TOTAL KNEE Left 06/05/12   TOTAL KNEE REVISION  06/03/2012   Procedure: TOTAL KNEE REVISION;  Surgeon: Gearlean Alf, MD;  Location: [facility];  Service: Orthopedics;  Laterality: Left;  Revision of a Left Uni Knee to a Total Knee Arthroplasty    Family History  Problem Relation Age of Onset   Diabetes Mother    Heart failure Mother  CHF   Thyroid disease Mother    Heart failure Father        CHF   Heart attack Brother    Breast cancer Sister    Asthma Sister    Heart failure Sister     Social History   Occupational History   Occupation: retired/disabled    Employer: [employer]    Comment: receptionist  Tobacco Use   Smoking status: Never   Smokeless tobacco: Never  Vaping Use   Vaping Use: Never used  Substance and Sexual Activity   Alcohol use: No    Alcohol/week: 0.0 standard drinks   Drug use: No   Sexual activity: Never    Partners: Male    Birth control/protection: Post-menopausal   Physical Exam: Vitals:   12/20/21 1545  BP: 122/64  Pulse: 74  Temp: 98.4 F (36.9 C)  SpO2: 94%   Gen: well appearing, ambulates with walker ENT: no thrush, mild erythema in oropharynx Resp: ctab  no wheezes or crackles CV: RRR no mrg Neuro: tremor with frequent spontaneous limb movements, seemingly involuntary  Data Reviewed: Imaging: Personally reviewed CTPE study Dec 2019 - negative for PE, +mosaic attentuation and air trapping.   PFTs:     Latest Ref Rng & Units 08/20/2019    3:59 PM  PFT Results  FVC-Pre L 2.35    FVC-Predicted Pre % 71    FVC-Post L 2.32    FVC-Predicted Post % 70    Pre FEV1/FVC % % 67    Post FEV1/FCV % % 68    FEV1-Pre L 1.56    FEV1-Predicted Pre % 62    FEV1-Post L 1.57    DLCO uncorrected ml/min/mmHg 21.15    DLCO UNC% % 101    DLVA Predicted % 132    TLC L 5.87    TLC % Predicted % 109    RV % Predicted % 141     I have personally reviewed the patient's PFTs and they are notable for moderate airflow limitation without a bronchodilator response.  FeNO 10 ppb - not elevated.   Labs: Lab Results  Component Value Date   WBC 5.7 12/18/2021   HGB 11.8 (L) 12/18/2021   HCT 35.0 (L) 12/18/2021   MCV 89.5 12/18/2021   PLT 288 12/18/2021    Immunization status: Immunization History  Administered Date(s) Administered   PFIZER(Purple Top)SARS-COV-2 Vaccination 09/19/2019, 10/13/2019   Tdap 04/24/2013   Zoster, Live 10/27/2012    Assessment:  Ms. Rachel [name] is a 71 y.o.  woman with a history of multiple system atrophy with a resting tremor who presents with:   Moderate persistent asthma with worsening control.  Gastroesophageal reflux disease poorly  controlled Allergic Rhinitis - poorly controlled  Plan/Recommendations: Continue dulera 200 mg 2 puffs twice a day.  Will add LAMA therapy with spiriva She doesn't think she can do repeat PFTs.  Continue montelukast for rhinitis Continue PPI for GERD.   Return to Care: Return in about 3 months (around 03/22/2022).  Lenice Llamas, MD Pulmonary and [place]

## 2021-12-20 NOTE — Patient Instructions (Signed)
Please schedule follow up scheduled with myself in 3 months.  If my schedule is not open yet, we will contact you with a reminder closer to that time. Please call [phone] if you haven't heard from [place] a month before.    Add spiriva to your inhaler regimen. 2 puffs once a day. Keep taking dulera.  You can take the albuterol nebulizer more frequently than the inhaler if that helps you more.  We can follow up and see how breathing is after a trial of this inhaler.

## 2022-01-08 DIAGNOSIS — J45909 Unspecified asthma, uncomplicated: Secondary | ICD-10-CM | POA: Diagnosis not present

## 2022-01-08 DIAGNOSIS — I5022 Chronic systolic (congestive) heart failure: Secondary | ICD-10-CM | POA: Diagnosis not present

## 2022-01-08 DIAGNOSIS — R0981 Nasal congestion: Secondary | ICD-10-CM | POA: Diagnosis not present

## 2022-01-08 DIAGNOSIS — J45901 Unspecified asthma with (acute) exacerbation: Secondary | ICD-10-CM | POA: Diagnosis not present

## 2022-01-15 DIAGNOSIS — G20 Parkinson's disease: Secondary | ICD-10-CM | POA: Diagnosis not present

## 2022-01-15 DIAGNOSIS — J45909 Unspecified asthma, uncomplicated: Secondary | ICD-10-CM | POA: Diagnosis not present

## 2022-01-15 DIAGNOSIS — J0100 Acute maxillary sinusitis, unspecified: Secondary | ICD-10-CM | POA: Diagnosis not present

## 2022-01-22 DIAGNOSIS — J0100 Acute maxillary sinusitis, unspecified: Secondary | ICD-10-CM | POA: Diagnosis not present

## 2022-01-22 DIAGNOSIS — J069 Acute upper respiratory infection, unspecified: Secondary | ICD-10-CM | POA: Diagnosis not present

## 2022-01-22 DIAGNOSIS — J45909 Unspecified asthma, uncomplicated: Secondary | ICD-10-CM | POA: Diagnosis not present

## 2022-01-22 DIAGNOSIS — G20 Parkinson's disease: Secondary | ICD-10-CM | POA: Diagnosis not present

## 2022-01-22 NOTE — Progress Notes (Signed)
Assessment/Plan:   1.  MSA  -Continue carbidopa/levodopa 25/100 CR, 2 tablets at 9 AM, 2 tablets at noon, 2 tablets at 3 PM, 1 tablet at 6 PM and 2 tablets at bedtime.  -increase gabapentin to 300 mg bid.  -can consider baclofen if needed in future for cramps  -referral to aqua therapy  -doesn't want neurocog test.   2.  Diplopia  -Saw Dr. Hassell Done years ago at [facility].  Diagnosis was congenital large intermittent exotropia/exophoria 3.  Dysphagia  -Last MBE in April, 2023.  No treatment was recommended.  Felt to be mild aspiration risk.  Mechanical soft diet, thin liquids with some regular solids as well recommended. 4.  Weight loss  -some due to nausea from med but not convinced that all of this is from meds.  F/u pcp 5.  Low BP  -pt on metoprolol and entresto.  Metoprolol has been decreased but may need continued decreased.  She is still having dizziness.    Subjective:   Rachel [name] was seen today in follow up for MSA.  My previous records were reviewed prior to todays visit as well as outside records available to me.  Last visit, we changed the way her levodopa was dosed (primarily the timing of the dosages).  She reports today that she did well with that change.  No falls.  She had a modified barium swallow done November 16, 2021, ordered by primary care.  They reported it was difficult to do because the dyskinesia caused her to move outside of the fluoroscopy.  They did state that the swallow was strong and timely without retention.  No treatment was recommended.  Felt to be mild aspiration risk.  Mechanical soft diet, thin liquids with some regular solids as well recommended.  She was in the emergency room at the end of May with abdominal pain.  Work-up was negative, felt possibly related to constipation.  She has also had a uri for almost 3 weeks and feels that this has contributed to generalized weakness.  She tells me that she restarted gabapentin.  It was helping tremor.  She had  d/c it b/c it didn't help neck pain but realized helping tremor.  She states that she is having a lot of pain/cramping in the R leg.   Current prescribed movement disorder medications:  Carbidopa/levodopa 25/100 CR,  2 tablets at 9 AM, 2 tablets at noon, 2 tablets at 3 PM, 1 tablet at 6 PM and 2 tablets at bedtime  Previous medications: Carbidopa/levodopa 25/100 IR; amantadine (stopped because of memory change/hallucinations); ALLERGIES:   Allergies  Allergen Reactions   Amantadine Hcl     Other reaction(s): Hallucinations   Azithromycin     Other reaction(s): thrush   Crestor [Rosuvastatin]     Other reaction(s): confusion   Ezetimibe     Other reaction(s): disoriented/confusion   Ezetimibe-Simvastatin     Other reaction(s): myalgias   Fosamax [Alendronate] Other (See Comments)    headache   Miacalcin [Calcitonin]     Other reaction(s): headache   Symbicort [Budesonide-Formoterol Fumarate] Other (See Comments)    Shakes and muscle weakness    CURRENT MEDICATIONS:  Outpatient Encounter Medications as of 01/24/2022  Medication Sig   albuterol (PROVENTIL) (2.5 MG/3ML) 0.083% nebulizer solution Take 3 mLs (2.5 mg total) by nebulization every 6 (six) hours as needed for wheezing or shortness of breath.   Carbidopa-Levodopa ER (SINEMET CR) 25-100 MG tablet controlled release 2 at 9am, 2 at noon, 2 at  3pm, 1 at 6pm, 2 q hs   citalopram (CELEXA) 20 MG tablet Take 20 mg by mouth daily.   ipratropium (ATROVENT) 0.06 % nasal spray SPRAY 2 SPRAYS IN EACH NOSTRIL EVERY 6 HOURS AS NEEDED FOR RUNNY NOSE   levothyroxine (SYNTHROID) 112 MCG tablet Take 100 mcg by mouth daily.   LINZESS 290 MCG CAPS capsule Take 290 mcg by mouth daily as needed.   metoprolol succinate (TOPROL XL) 25 MG 24 hr tablet Take 0.5 tablets (12.5 mg total) by mouth daily. Hold if top blood pressure number less than 100 mmHg or pulse less than 60 bpm.   mometasone-formoterol (DULERA) 200-5 MCG/ACT AERO Inhale 2 puffs into  the lungs in the morning and at bedtime.   montelukast (SINGULAIR) 10 MG tablet Take 1 tablet (10 mg total) by mouth daily.   Multiple Vitamin (MULTIVITAMIN WITH MINERALS) TABS tablet Take 2 tablets by mouth daily.    pantoprazole (PROTONIX) 40 MG tablet Take 1 tablet (40 mg total) by mouth 2 (two) times daily.   potassium chloride SA (KLOR-CON) 20 MEQ tablet Take 1 tablet by mouth daily at 12 noon.   pravastatin (PRAVACHOL) 40 MG tablet Take 1 tablet (40 mg total) by mouth daily at 6 PM.   sacubitril-valsartan (ENTRESTO) 24-26 MG Take 1 tablet by mouth 2 (two) times daily.   Tiotropium Bromide Monohydrate (SPIRIVA RESPIMAT) 1.25 MCG/ACT AERS Inhale 2 puffs into the lungs daily.   VOLTAREN 1 % GEL Apply 1 application topically 4 (four) times daily as needed (pain).    [DISCONTINUED] furosemide (LASIX) 20 MG tablet Take 10 mg by mouth daily as needed. (Patient not taking: Reported on 01/24/2022)   No facility-administered encounter medications on file as of 01/24/2022.    Objective:   PHYSICAL EXAMINATION:    VITALS:   Vitals:   01/24/22 1311  BP: 125/68  Pulse: 71  SpO2: 96%  Weight: 174 lb (78.9 kg)  Height: 5\' 6"  (1.676 m)      Wt Readings from Last 3 Encounters:  01/24/22 174 lb (78.9 kg)  12/20/21 176 lb (79.8 kg)  12/18/21 174 lb (78.9 kg)     GEN:  The patient appears stated age and is in NAD. HEENT:  Normocephalic, atraumatic.  The mucous membranes are moist.   Neurological examination:  Orientation: The patient is alert and oriented x3.  She had no problem relating her history. Cranial nerves: There is good facial symmetry with min facial hypomimia. The speech is fluent and clear. Soft palate rises symmetrically and there is no tongue deviation. Hearing is intact to conversational tone. Sensation: Sensation is intact to light touch throughout Motor: Strength is at least antigravity x4.  She is holding the R leg (hamstring) b/c of cramping  Movement  examination: Tone: There is nl tone in the UE/LE Abnormal movements: there is tremor, primarily in the R leg today.  She has tremor in the left hand. Coordination:  There is mild decremation with RAM's Gait and Station: She ambulates fairly well with a walker in the hallway.   I have reviewed and interpreted the following labs independently    Chemistry      Component Value Date/Time   NA 140 12/18/2021 2344   NA 139 05/24/2020 1631   K 4.3 12/18/2021 2344   CL 104 12/18/2021 2344   CO2 24 12/18/2021 2344   BUN 19 12/18/2021 2344   BUN 22 05/24/2020 1631   CREATININE 1.10 (H) 12/18/2021 2344  Component Value Date/Time   CALCIUM 9.3 12/18/2021 2344   ALKPHOS 78 12/18/2021 2344   AST 16 12/18/2021 2344   ALT <5 12/18/2021 2344   BILITOT 0.6 12/18/2021 2344       Lab Results  Component Value Date   WBC 5.7 12/18/2021   HGB 11.8 (L) 12/18/2021   HCT 35.0 (L) 12/18/2021   MCV 89.5 12/18/2021   PLT 288 12/18/2021    Lab Results  Component Value Date   TSH 1.862 02/18/2020    Total time spent on today's visit was 31 minutes, including both face-to-face time and nonface-to-face time.  Time included that spent on review of records (prior notes available to me/labs/imaging if pertinent), discussing treatment and goals, answering patient's questions and coordinating care.  Cc:  Ginger Organ., MD

## 2022-01-24 ENCOUNTER — Ambulatory Visit: Payer: Medicare Other | Admitting: Neurology

## 2022-01-24 ENCOUNTER — Encounter: Payer: Self-pay | Admitting: Neurology

## 2022-01-24 VITALS — BP 125/68 | HR 71 | Ht 66.0 in | Wt 174.0 lb

## 2022-01-24 DIAGNOSIS — G232 Striatonigral degeneration: Secondary | ICD-10-CM | POA: Diagnosis not present

## 2022-01-24 MED ORDER — GABAPENTIN 300 MG PO CAPS: 300.0000 mg | ORAL_CAPSULE | Freq: Two times a day (BID) | ORAL | 1 refills | Status: DC

## 2022-01-24 MED ORDER — GABAPENTIN 300 MG PO CAPS: 300.0000 mg | ORAL_CAPSULE | Freq: Two times a day (BID) | ORAL | 0 refills | Status: DC

## 2022-01-24 NOTE — Patient Instructions (Addendum)
Take gabapentin 300 mg in the AM x 1 week and then take 300 mg twice per day thereafter  The physicians and staff at [facility] are committed to providing excellent care. You may receive a survey requesting feedback about your experience at our office. We strive to receive "very good" responses to the survey questions. If you feel that your experience would prevent you from giving the office a "very good " response, please contact our office to try to remedy the situation. We may be reached at [phone]. Thank you for taking the time out of your busy day to complete the survey.

## 2022-02-05 ENCOUNTER — Ambulatory Visit: Payer: Medicare Other | Admitting: Physical Therapy

## 2022-02-09 NOTE — Therapy (Signed)
OUTPATIENT PHYSICAL THERAPY NEURO EVALUATION   Patient Name: Rachel [name] MRN: [id number] DOB:22-Nov-1950, 71 y.o., female Today's Date: 02/12/2022   PCP: Ginger Organ., MD REFERRING PROVIDER: Ludwig Clarks, DO   PT End of Session - 02/12/22 1622     Visit Number 1    Number of Visits 17    Date for PT Re-Evaluation 04/09/22    Authorization Type BCBS Medicare    PT Start Time 8850    PT Stop Time 1602    PT Time Calculation (min) 45 min    Equipment Utilized During Treatment Gait belt    Activity Tolerance Patient tolerated treatment well    Behavior During Therapy WFL for tasks assessed/performed             Past Medical History:  Diagnosis Date   Allergic rhinitis    Anemia    Arthritis    Asthma    Blood clot in vein  age 71   left leg   Depression    GERD (gastroesophageal reflux disease)    Hyperlipidemia    Hypertension    pt denies, in physician record   Hypothyroidism    Kidney stones    Multiple pregnancy loss, not currently pregnant    multiple miscarriages-pt.unsure of #   Multiple system atrophy ([place]) 2015   Nephrolithiasis    hx of kidney stones, last 6 months ago   Osteopenia    Osteoporosis    Parkinsonism ([place])    Tendinitis    left foot   Vitamin D deficiency    Past Surgical History:  Procedure Laterality Date   BREAST EXCISIONAL BIOPSY Right    BREAST SURGERY     rt. lumpectomy, benign   colonscopy  2011   cystoscopy  2012   JOINT REPLACEMENT  05-2007   right knee replacment/revision   KIDNEY STONE SURGERY     LEFT HEART CATH AND CORONARY ANGIOGRAPHY N/A 02/21/2020   Procedure: LEFT HEART CATH AND CORONARY ANGIOGRAPHY;  Surgeon: Adrian Prows, MD;  Location: [place] [facility];  Service: Cardiovascular;  Laterality: N/A;   REPLACEMENT TOTAL KNEE Left 06/05/12   TOTAL KNEE REVISION  06/03/2012   Procedure: TOTAL KNEE REVISION;  Surgeon: Gearlean Alf, MD;  Location: [facility];  Service: Orthopedics;  Laterality: Left;  Revision  of a Left Uni Knee to a Total Knee Arthroplasty   Patient Active Problem List   Diagnosis Date Noted   Prolonged Q-T interval on ECG    Class 1 obesity due to excess calories with serious comorbidity and body mass index (BMI) of 34.0 to 34.9 in adult    Community acquired pneumonia of right lower lobe of lung 02/17/2020   Mild intermittent asthma with (acute) exacerbation 02/17/2020   Acute respiratory failure with hypoxia ([place]) 02/17/2020   Acute combined systolic and diastolic heart failure (HCC)    Cardiomyopathy (HCC)    Type 2 myocardial infarction ([place])    Elevated troponin    Sinus tachycardia    LBBB (left bundle branch block)    Shortness of breath    Hypertensive heart and kidney disease with HF and with CKD stage I-IV ([place])    Mixed hyperlipidemia    Hx of deep venous thrombosis    Multiple system atrophy, 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[place]) 11/19/2013   Thrush 06/15/2012   S/P total knee arthroplasty 06/15/2012   Fall 06/13/2012   Urinary incontinence 06/13/2012   Postop Acute blood loss anemia 06/04/2012   Postop Hypokalemia  06/04/2012   Pain due to unicompartmental arthroplasty of knee ([place]) 06/03/2012   GERD 03/18/2008   GI BLEED 03/18/2008   DYSPHAGIA 03/18/2008   FECAL OCCULT BLOOD 03/18/2008    ONSET DATE: past several years  REFERRING DIAG: G23.2 (ICD-10-CM) - Multiple system atrophy, 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HCC)  THERAPY DIAG:  Muscle weakness (generalized)  Unsteadiness on feet  Dizziness and giddiness  Other muscle spasm  Rationale for Evaluation and Treatment Rehabilitation  SUBJECTIVE:                                                                                                                                                                                              SUBJECTIVE STATEMENT: Patient reports that she needs to strengthen her arms to transfer herself. Unable to get herself off the floor- has been having to call 911 as husband is unable to  assist her. Using a [id number] for the past ~3 years. Diagnosed with MSA 15 years ago. Has episodes of muscle shaking and tightening and this causes pain. Unsure what these episodes are aggravated by but they are improved with walking and movement. Reports trouble with writing, buttoning buttons, trouble swallowing. Also mentions dizziness when bending over or changing positions too fast for years and has gotten worse. Reports low BP.  Pt accompanied by: self  PERTINENT HISTORY: anemia, asthma, depression, GERD, HLD, HTN, MSA, osteoporosis, Parkinsonism, B TKA  PAIN:  Are you having pain? Yes: NPRS scale: 8/10 Pain location: mouth Pain description: sore Aggravating factors: had dental work recently Relieving factors: none  PRECAUTIONS: Fall  WEIGHT BEARING RESTRICTIONS No  FALLS: Has patient fallen in last 6 months? No  LIVING ENVIRONMENT: Lives with: lives with their spouse Lives in: House/apartment Stairs: No Has following equipment at home: Environmental consultant - 4 wheeled, Electronics engineer, and Grab bars  PLOF: Independent with basic ADLs and husband assists with cleaning and cooking  PATIENT GOALS improve strength  OBJECTIVE:   DIAGNOSTIC FINDINGS: none recent  COGNITION: Overall cognitive status: No family/caregiver present to determine baseline cognitive functioning  OBSERVATION: chorea throughout session; tendency for R lateral lean   SENSATION: WFL  COORDINATION: Alt pronation/supination B intact Finger to nose B intact Heel to shin B intact  MUSCLE TONE: slightly low tone in B LEs  POSTURE: rounded shoulders, forward head, increased thoracic kyphosis, and posterior pelvic tilt  LOWER EXTREMITY ROM:     Active  Right Eval Left Eval  Hip flexion    Hip extension    Hip abduction    Hip adduction    Hip internal rotation    Hip external rotation  Knee flexion    Knee extension    Ankle dorsiflexion 11 10  Ankle plantarflexion    Ankle inversion    Ankle eversion      (Blank rows = not tested)  LOWER EXTREMITY MMT:    MMT (in sitting) Right Eval Left Eval  Hip flexion 4 4  Hip extension    Hip abduction 4 4  Hip adduction 4- 4  Hip internal rotation    Hip external rotation    Knee flexion 4+ 4+  Knee extension 4+ 4  Ankle dorsiflexion 4 4  Ankle plantarflexion 4+ 4+  Ankle inversion    Ankle eversion    (Blank rows = not tested)  TRANSFERS: Sit to stand:  heavy reliance on UEs  Stand to sit:  when using walker, unsafe navigation/use of walker    GAIT: Gait pattern:  forward trunk lean throughout, ambulating with walker close to BOS Assistive device utilized: Environmental consultant - 4 wheeled Level of assistance: Modified independence   FUNCTIONAL TESTs:    [facility] PT Assessment - 02/12/22 0001       Standardized Balance Assessment   Standardized Balance Assessment Five Times Sit to Stand;Timed Up and Go Test;Berg Balance Test    Five times sit to stand comments  12.02 sec   with B armrests     Berg Balance Test   Sit to Stand Able to stand  independently using hands    Standing Unsupported Able to stand 2 minutes with supervision    Sitting with Back Unsupported but Feet Supported on Floor or Stool Able to sit 30 seconds    Stand to Sit Controls descent by using hands    Transfers Able to transfer safely, definite need of hands    Standing Unsupported with Eyes Closed Able to stand 10 seconds safely    Standing Unsupported with Feet Together Able to place feet together independently and stand 1 minute safely    From Standing, Reach Forward with Outstretched Arm Can reach forward >12 cm safely (5")    From Standing Position, Pick up Object from Floor Able to pick up shoe safely and easily    From Standing Position, Turn to Look Behind Over each Shoulder Looks behind from both sides and weight shifts well    Turn 360 Degrees Needs assistance while turning   limited by dizziness   Standing Unsupported, Alternately Place Feet on Step/Stool Able to  stand independently and safely and complete 8 steps in 20 seconds    Standing Unsupported, One Foot in [employer] balance while stepping or standing    Standing on One Leg Unable to try or needs assist to prevent fall    Total Score 37      Timed Up and Go Test   Normal TUG (seconds) 12.98   with 3YQ               PATIENT EDUCATION: Education details: prognosis, POC, HEP; provided information on aquatic therapy and benefits of OT and ST Person educated: Patient Education method: Explanation, Demonstration, Tactile cues, Verbal cues, and Handouts Education comprehension: verbalized understanding   HOME EXERCISE PROGRAM: Access Code: [email or web address] URL: [email or web address] Date: 02/12/2022 Prepared by: [facility]  Exercises - Supine Bridge with Mini Swiss Ball Between Knees  - 1 x daily - 5 x weekly - 2 sets - 10 reps - Semi-Tandem Balance at Counter Top Eyes OPEN - 1 x daily - 5 x weekly - 2  sets - 30 sec hold - Mini Squat with Counter Support  - 1 x daily - 5 x weekly - 2 sets - 10 reps - Standing Hip Abduction with Counter Support  - 1 x daily - 5 x weekly - 2 sets - 10 reps - Sit to Stand with Counter Support  - 1 x daily - 5 x weekly - 2 sets - 10 reps    GOALS: Goals reviewed with patient? Yes  SHORT TERM GOALS: Target date: 03/05/2022  Patient to be independent with initial HEP. Baseline: HEP initiated Goal status: INITIAL    LONG TERM GOALS: Target date: 04/09/2022  Patient to be independent with advanced HEP. Baseline: Not yet initiated  Goal status: INITIAL  Patient to demonstrate floor to stand transfer with min-mod A.  Baseline: NT Goal status: INITIAL  Patient to score at least 43/56 on Berg in order to decrease risk of falls.   Baseline: 37/56 Goal status: INITIAL  Patient to demonstrate 5xSTS test in <15 sec with hands on knees in order to decrease risk of falls.   Baseline: 12 sec with B  armrests Goal status: INITIAL  Patient to verbalize understanding of fall prevention in home environment information. Baseline: Not yet initiated Goal status: INITIAL     ASSESSMENT:  CLINICAL IMPRESSION:  Patient is a 71 y/o F, PMH significant for MSA, presenting to OPPT with c/o weakness, dizziness, and trouble with transfers progressing for the past couple years. Patient is requesting aquatic therapy and also mentions concerns that may be addressed with OT and ST. Patient today presenting with decreased tone in B LEs, rounded and forward head posture, decreased B hip strength, increased reliance on UEs for transfers, gait deviations. Patient's score on Merrilee Jansky indicates an increased risk of falls. Patient was educated on gentle strengthening and balance HEP and reported understanding. Would benefit from skilled PT services 2 x/week for 8 weeks to address aforementioned impairments in order to optimize level of function.     OBJECTIVE IMPAIRMENTS Abnormal gait, decreased balance, decreased strength, decreased safety awareness, dizziness, increased muscle spasms, impaired flexibility, impaired tone, improper body mechanics, postural dysfunction, and pain.   ACTIVITY LIMITATIONS carrying, lifting, bending, standing, squatting, stairs, transfers, bed mobility, bathing, dressing, reach over head, and locomotion level  PARTICIPATION LIMITATIONS: meal prep, cleaning, laundry, shopping, community activity, school, and church  PERSONAL FACTORS Age, Past/current experiences, Time since onset of injury/illness/exacerbation, and 3+ comorbidities: anemia, asthma, depression, GERD, HLD, HTN, MSA, osteoporosis, Parkinsonism, B TKA  are also affecting patient's functional outcome.   REHAB POTENTIAL: Good  CLINICAL DECISION MAKING: Evolving/moderate complexity  EVALUATION COMPLEXITY: Moderate  PLAN: PT FREQUENCY: 2x/week  PT DURATION: 8 weeks  PLANNED INTERVENTIONS: Therapeutic exercises,  Therapeutic activity, Neuromuscular re-education, Balance training, Gait training, Patient/Family education, Self Care, Joint mobilization, Stair training, Vestibular training, Canalith repositioning, Aquatic Therapy, Dry Needling, Electrical stimulation, Cryotherapy, Moist heat, Taping, Manual therapy, and Re-evaluation  PLAN FOR NEXT SESSION: reassess HEP, progress hip strengthening, static and dynamic balance    [name], PT, DPT 02/12/22 5:22 PM  [facility] at [facility] [address] [place], [place] [address] Phone # [phone] Fax # [phone]

## 2022-02-11 ENCOUNTER — Ambulatory Visit: Payer: Medicare Other | Admitting: Cardiology

## 2022-02-12 ENCOUNTER — Telehealth: Payer: Self-pay | Admitting: Physical Therapy

## 2022-02-12 ENCOUNTER — Encounter: Payer: Self-pay | Admitting: Physical Therapy

## 2022-02-12 ENCOUNTER — Ambulatory Visit: Payer: Medicare Other | Attending: Neurology | Admitting: Physical Therapy

## 2022-02-12 DIAGNOSIS — R2681 Unsteadiness on feet: Secondary | ICD-10-CM | POA: Insufficient documentation

## 2022-02-12 DIAGNOSIS — R42 Dizziness and giddiness: Secondary | ICD-10-CM | POA: Insufficient documentation

## 2022-02-12 DIAGNOSIS — M62838 Other muscle spasm: Secondary | ICD-10-CM | POA: Diagnosis not present

## 2022-02-12 DIAGNOSIS — G232 Striatonigral degeneration: Secondary | ICD-10-CM | POA: Diagnosis not present

## 2022-02-12 DIAGNOSIS — M6281 Muscle weakness (generalized): Secondary | ICD-10-CM | POA: Diagnosis not present

## 2022-02-12 DIAGNOSIS — R1312 Dysphagia, oropharyngeal phase: Secondary | ICD-10-CM

## 2022-02-12 NOTE — Addendum Note (Signed)
Addended by: Ludwig Clarks on: 02/12/2022 05:29 PM   Modules accepted: Orders

## 2022-02-12 NOTE — Telephone Encounter (Signed)
Dr. Carles Collet,  Rachel [name] was evaluated in [place] today per your referral for treatment of MSA. The patient would benefit from OT evaluation for difficulty with fine motor skills and ST evaluation for dysphagia.    If you agree, please place an order in [facility] in [facility] or fax the order to [phone].  Thank you,  [name], PT, DPT 02/12/22 5:25 PM    [place] [address] [place], [place]  [address] Phone:  [phone] Fax:  [phone]

## 2022-02-18 ENCOUNTER — Encounter: Payer: Self-pay | Admitting: Cardiology

## 2022-02-18 ENCOUNTER — Ambulatory Visit: Payer: Medicare Other | Admitting: Cardiology

## 2022-02-18 VITALS — BP 120/82 | HR 75 | Temp 97.5°F | Resp 16 | Ht 66.0 in | Wt 178.4 lb

## 2022-02-18 DIAGNOSIS — E782 Mixed hyperlipidemia: Secondary | ICD-10-CM

## 2022-02-18 DIAGNOSIS — I5032 Chronic diastolic (congestive) heart failure: Secondary | ICD-10-CM

## 2022-02-18 DIAGNOSIS — I428 Other cardiomyopathies: Secondary | ICD-10-CM

## 2022-02-18 DIAGNOSIS — I130 Hypertensive heart and chronic kidney disease with heart failure and stage 1 through stage 4 chronic kidney disease, or unspecified chronic kidney disease: Secondary | ICD-10-CM

## 2022-02-18 DIAGNOSIS — R072 Precordial pain: Secondary | ICD-10-CM | POA: Diagnosis not present

## 2022-02-18 NOTE — Therapy (Signed)
OUTPATIENT PHYSICAL THERAPY NEURO TREATMENT   Patient Name: Rachel [name] MRN: [id number] DOB:1951-01-13, 71 y.o., female Today's Date: 02/19/2022   PCP: Ginger Organ., MD REFERRING PROVIDER: Ludwig Clarks, DO   PT End of Session - 02/19/22 1530     Visit Number 2    Number of Visits 17    Date for PT Re-Evaluation 04/09/22    Authorization Type BCBS Medicare    PT Start Time 1450    PT Stop Time 1529    PT Time Calculation (min) 39 min    Equipment Utilized During Treatment Gait belt    Activity Tolerance Patient tolerated treatment well    Behavior During Therapy WFL for tasks assessed/performed              Past Medical History:  Diagnosis Date   Allergic rhinitis    Anemia    Arthritis    Asthma    Blood clot in vein  age 41   left leg   Depression    GERD (gastroesophageal reflux disease)    Hyperlipidemia    Hypertension    pt denies, in physician record   Hypothyroidism    Kidney stones    Multiple pregnancy loss, not currently pregnant    multiple miscarriages-pt.unsure of #   Multiple system atrophy ([place]) 2015   Nephrolithiasis    hx of kidney stones, last 6 months ago   Osteopenia    Osteoporosis    Parkinsonism ([place])    Tendinitis    left foot   Vitamin D deficiency    Past Surgical History:  Procedure Laterality Date   BREAST EXCISIONAL BIOPSY Right    BREAST SURGERY     rt. lumpectomy, benign   colonscopy  2011   cystoscopy  2012   JOINT REPLACEMENT  05-2007   right knee replacment/revision   KIDNEY STONE SURGERY     LEFT HEART CATH AND CORONARY ANGIOGRAPHY N/A 02/21/2020   Procedure: LEFT HEART CATH AND CORONARY ANGIOGRAPHY;  Surgeon: Adrian Prows, MD;  Location: [facility];  Service: Cardiovascular;  Laterality: N/A;   REPLACEMENT TOTAL KNEE Left 06/05/12   TOTAL KNEE REVISION  06/03/2012   Procedure: TOTAL KNEE REVISION;  Surgeon: Gearlean Alf, MD;  Location: [facility];  Service: Orthopedics;  Laterality: Left;  Revision  of a Left Uni Knee to a Total Knee Arthroplasty   Patient Active Problem List   Diagnosis Date Noted   Prolonged Q-T interval on ECG    Class 1 obesity due to excess calories with serious comorbidity and body mass index (BMI) of 34.0 to 34.9 in adult    Community acquired pneumonia of right lower lobe of lung 02/17/2020   Mild intermittent asthma with (acute) exacerbation 02/17/2020   Acute respiratory failure with hypoxia ([place]) 02/17/2020   Acute combined systolic and diastolic heart failure (HCC)    Cardiomyopathy (HCC)    Type 2 myocardial infarction ([place])    Elevated troponin    Sinus tachycardia    LBBB (left bundle branch block)    Shortness of breath    Hypertensive heart and kidney disease with HF and with CKD stage I-IV ([place])    Mixed hyperlipidemia    Hx of deep venous thrombosis    Multiple system atrophy, 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/19/2013   Thrush 06/15/2012   S/P total knee arthroplasty 06/15/2012   Fall 06/13/2012   Urinary incontinence 06/13/2012   Postop Acute blood loss anemia 06/04/2012   Postop  Hypokalemia 06/04/2012   Pain due to unicompartmental arthroplasty of knee ([place]) 06/03/2012   GERD 03/18/2008   GI BLEED 03/18/2008   DYSPHAGIA 03/18/2008   FECAL OCCULT BLOOD 03/18/2008    ONSET DATE: past several years  REFERRING DIAG: G23.2 (ICD-10-CM) - Multiple system atrophy, 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HCC)  THERAPY DIAG:  Muscle weakness (generalized)  Unsteadiness on feet  Dizziness and giddiness  Other muscle spasm  Rationale for Evaluation and Treatment Rehabilitation  SUBJECTIVE:                                                                                                                                                                                              SUBJECTIVE STATEMENT: Patient reports she woke up with a lot of shaking and fatigue today- the only thing that helps the muscle spasms. Not sure if she needs OT.  Pt accompanied by:  self  PERTINENT HISTORY: anemia, asthma, depression, GERD, HLD, HTN, MSA, osteoporosis, Parkinsonism, B TKA  PAIN:  Are you having pain? No  PRECAUTIONS: Fall  PATIENT GOALS improve strength  OBJECTIVE:     TODAY'S TREATMENT: 02/19/22 Activity Comments  semi tandem balance R/L 30" Sway an effect of tremor  mini squat 10x Good form  standing hip ABD 10x each LE, 8x with 2# each LE Cues to avoid anterior trunk lean   STS hands on knees 5x Cues on improved eccentric control  TB row/extension red TB 2x10 each Cues for form and looking straight ahead   fwd/back stepping with 1 hand on counter 10x Cues to encourage knee flexion for backwards stepping  Tandem walk along counter top Cueing to avoid looking down     Below measures were taken at time of initial evaluation unless otherwise specified:   DIAGNOSTIC FINDINGS: none recent  COGNITION: Overall cognitive status: No family/caregiver present to determine baseline cognitive functioning  OBSERVATION: chorea throughout session; tendency for R lateral lean   SENSATION: WFL  COORDINATION: Alt pronation/supination B intact Finger to nose B intact Heel to shin B intact  MUSCLE TONE: slightly low tone in B LEs  POSTURE: rounded shoulders, forward head, increased thoracic kyphosis, and posterior pelvic tilt  LOWER EXTREMITY ROM:     Active  Right Eval Left Eval  Hip flexion    Hip extension    Hip abduction    Hip adduction    Hip internal rotation    Hip external rotation    Knee flexion    Knee extension    Ankle dorsiflexion 11 10  Ankle plantarflexion    Ankle inversion    Ankle eversion     (  Blank rows = not tested)  LOWER EXTREMITY MMT:    MMT (in sitting) Right Eval Left Eval  Hip flexion 4 4  Hip extension    Hip abduction 4 4  Hip adduction 4- 4  Hip internal rotation    Hip external rotation    Knee flexion 4+ 4+  Knee extension 4+ 4  Ankle dorsiflexion 4 4  Ankle plantarflexion 4+ 4+   Ankle inversion    Ankle eversion    (Blank rows = not tested)  TRANSFERS: Sit to stand:  heavy reliance on UEs  Stand to sit:  when using walker, unsafe navigation/use of walker    GAIT: Gait pattern:  forward trunk lean throughout, ambulating with walker close to BOS Assistive device utilized: Environmental consultant - 4 wheeled Level of assistance: Modified independence   FUNCTIONAL TESTs:         PATIENT EDUCATION: Education details: prognosis, POC, HEP; provided information on aquatic therapy and benefits of OT and ST Person educated: Patient Education method: Explanation, Demonstration, Tactile cues, Verbal cues, and Handouts Education comprehension: verbalized understanding   HOME EXERCISE PROGRAM: Access Code: [email or web address] URL: [email or web address] Date: 02/12/2022 Prepared by: [facility]  Exercises - Supine Bridge with Mini Swiss Ball Between Knees  - 1 x daily - 5 x weekly - 2 sets - 10 reps - Semi-Tandem Balance at Counter Top Eyes OPEN - 1 x daily - 5 x weekly - 2 sets - 30 sec hold - Mini Squat with Counter Support  - 1 x daily - 5 x weekly - 2 sets - 10 reps - Standing Hip Abduction with Counter Support  - 1 x daily - 5 x weekly - 2 sets - 10 reps - Sit to Stand with Counter Support  - 1 x daily - 5 x weekly - 2 sets - 10 reps    GOALS: Goals reviewed with patient? Yes  SHORT TERM GOALS: Target date: 03/05/2022  Patient to be independent with initial HEP. Baseline: HEP initiated Goal status: IN PROGRESS    LONG TERM GOALS: Target date: 04/09/2022  Patient to be independent with advanced HEP. Baseline: Not yet initiated  Goal status: IN PROGRESS  Patient to demonstrate floor to stand transfer with min-mod A.  Baseline: NT Goal status: IN PROGRESS  Patient to score at least 43/56 on [place] in order to decrease risk of falls.   Baseline: 37/56 Goal status: IN PROGRESS  Patient to demonstrate 5xSTS test in  <15 sec with hands on knees in order to decrease risk of falls.   Baseline: 12 sec with B armrests Goal status: IN PROGRESS  Patient to verbalize understanding of fall prevention in home environment information. Baseline: Not yet initiated Goal status: IN PROGRESS     ASSESSMENT:  CLINICAL IMPRESSION:  Patient arrived to session with observable total body tremor and fatigue - reports that she woke up with it this AM and that it tends to fluctuate. Patient with improved spasticity/tremor with [place] activities, thus focused on standing postural strengthening and balance activities. Patient required reminders to maintain tall posture and avoid looking down. Short rest breaks required occasionally d/t report of fatigue. Tremor appeared significantly improved at end of session. No compalints upon leaving.     OBJECTIVE IMPAIRMENTS Abnormal gait, decreased balance, decreased strength, decreased safety awareness, dizziness, increased muscle spasms, impaired flexibility, impaired tone, improper body mechanics, postural dysfunction, and pain.   ACTIVITY LIMITATIONS carrying, lifting, bending, standing, squatting, stairs, transfers,  bed mobility, bathing, dressing, reach over head, and locomotion level  PARTICIPATION LIMITATIONS: meal prep, cleaning, laundry, shopping, community activity, school, and church  PERSONAL FACTORS Age, Past/current experiences, Time since onset of injury/illness/exacerbation, and 3+ comorbidities: anemia, asthma, depression, GERD, HLD, HTN, MSA, osteoporosis, Parkinsonism, B TKA  are also affecting patient's functional outcome.   REHAB POTENTIAL: Good  CLINICAL DECISION MAKING: Evolving/moderate complexity  EVALUATION COMPLEXITY: Moderate  PLAN: PT FREQUENCY: 2x/week  PT DURATION: 8 weeks  PLANNED INTERVENTIONS: Therapeutic exercises, Therapeutic activity, Neuromuscular re-education, Balance training, Gait training, Patient/Family education, Self Care, Joint  mobilization, Stair training, Vestibular training, Canalith repositioning, Aquatic Therapy, Dry Needling, Electrical stimulation, Cryotherapy, Moist heat, Taping, Manual therapy, and Re-evaluation  PLAN FOR NEXT SESSION: reassess HEP, progress hip strengthening, static and dynamic balance    [name], PT, DPT 02/19/22 3:31 PM  [facility] at [facility] [address], [place], [place] [address] Phone # [phone] Fax # [phone]

## 2022-02-18 NOTE — Progress Notes (Signed)
Rachel [name] Date of Birth: March 05, 1951 MRN: [id number] Primary Care Provider:Shaw, Emily Filbert., MD Primary Cardiologist: Rex Kras, DO, [facility] (established care 03/10/2020)  Date: 02/18/22 Last Office Visit: August 14, 2021  Chief Complaint  Patient presents with   Chest Pain   Chronic heart failure    Follow-up    HPI  Rachel [name] is a 71 y.o.  female whose past medical history and cardiovascular risk factors include: Chronic systolic and diastolic heart failure/stage C/NYHA class II, nonischemic cardiomyopathy, multiple system atrophy, asthma, history of left lower extremity DVT, hypertension, hyperlipidemia, hypothyroidism, left bundle branch block, advanced age, postmenopausal female.  Patient is accompanied by her husband Rachel [name] at today's office visit.  In the past given her symptoms she was diagnosed with cardiomyopathy and underwent invasive angiography which noted normal epicardial coronary arteries.  Since then she has been uptitrated on GDMT and her LVEF has improved.  She now presents for 31-monthfollow-up visit.  Has nonspecific precordial discomfort but no angina factors.  Denies orthopnea, paroxysmal nocturnal dyspnea or lower extremity swelling.  No recent hospitalizations or urgent care visits for cardiovascular symptoms.   ALLERGIES: Allergies  Allergen Reactions   Amantadine Hcl     Other reaction(s): Hallucinations   Azithromycin     Other reaction(s): thrush   Crestor [Rosuvastatin]     Other reaction(s): confusion   Ezetimibe     Other reaction(s): disoriented/confusion   Ezetimibe-Simvastatin     Other reaction(s): myalgias   Fosamax [Alendronate] Other (See Comments)    headache   Miacalcin [Calcitonin]     Other reaction(s): headache   Symbicort [Budesonide-Formoterol Fumarate] Other (See Comments)    Shakes and muscle weakness    MEDICATION LIST PRIOR TO VISIT: Current Outpatient Medications on File Prior to Visit  Medication Sig  Dispense Refill   albuterol (PROVENTIL) (2.5 MG/3ML) 0.083% nebulizer solution Take 3 mLs (2.5 mg total) by nebulization every 6 (six) hours as needed for wheezing or shortness of breath. 120 mL 11   Carbidopa-Levodopa ER (SINEMET CR) 25-100 MG tablet controlled release 2 at 9am, 2 at noon, 2 at 3pm, 1 at 6pm, 2 q hs 270 tablet 5   citalopram (CELEXA) 20 MG tablet Take 20 mg by mouth daily.     gabapentin (NEURONTIN) 300 MG capsule Take 1 capsule (300 mg total) by mouth 2 (two) times daily. 60 capsule 0   ipratropium (ATROVENT) 0.06 % nasal spray SPRAY 2 SPRAYS IN EACH NOSTRIL EVERY 6 HOURS AS NEEDED FOR RUNNY NOSE 45 mL 1   levothyroxine (SYNTHROID) 112 MCG tablet Take 100 mcg by mouth daily.     LINZESS 290 MCG CAPS capsule Take 290 mcg by mouth daily as needed.     metoprolol succinate (TOPROL XL) 25 MG 24 hr tablet Take 0.5 tablets (12.5 mg total) by mouth daily. Hold if top blood pressure number less than 100 mmHg or pulse less than 60 bpm. 90 tablet 3   mometasone-formoterol (DULERA) 200-5 MCG/ACT AERO Inhale 2 puffs into the lungs in the morning and at bedtime. 1 each 5   montelukast (SINGULAIR) 10 MG tablet Take 1 tablet (10 mg total) by mouth daily. 30 tablet 0   Multiple Vitamin (MULTIVITAMIN WITH MINERALS) TABS tablet Take 2 tablets by mouth daily.     pantoprazole (PROTONIX) 40 MG tablet Take 1 tablet (40 mg total) by mouth 2 (two) times daily. 180 tablet 1   potassium chloride SA (KLOR-CON) 20 MEQ tablet Take 1 tablet by mouth  daily at 12 noon.     pravastatin (PRAVACHOL) 40 MG tablet Take 1 tablet (40 mg total) by mouth daily at 6 PM. 30 tablet 0   predniSONE (DELTASONE) 20 MG tablet      sacubitril-valsartan (ENTRESTO) 24-26 MG Take 1 tablet by mouth 2 (two) times daily. 180 tablet 3   Tiotropium Bromide Monohydrate (SPIRIVA RESPIMAT) 1.25 MCG/ACT AERS Inhale 2 puffs into the lungs daily. 1 each 2   VOLTAREN 1 % GEL Apply 1 application  topically 4 (four) times daily as needed  (pain).     No current facility-administered medications on file prior to visit.    PAST MEDICAL HISTORY: Past Medical History:  Diagnosis Date   Allergic rhinitis    Anemia    Arthritis    Asthma    Blood clot in vein  age 44   left leg   Depression    GERD (gastroesophageal reflux disease)    Hyperlipidemia    Hypertension    pt denies, in physician record   Hypothyroidism    Kidney stones    Multiple pregnancy loss, not currently pregnant    multiple miscarriages-pt.unsure of #   Multiple system atrophy ([place]) 2015   Nephrolithiasis    hx of kidney stones, last 6 months ago   Osteopenia    Osteoporosis    Parkinsonism ([place])    Tendinitis    left foot   Vitamin D deficiency     PAST SURGICAL HISTORY: Past Surgical History:  Procedure Laterality Date   BREAST EXCISIONAL BIOPSY Right    BREAST SURGERY     rt. lumpectomy, benign   colonscopy  2011   cystoscopy  2012   JOINT REPLACEMENT  05-2007   right knee replacment/revision   KIDNEY STONE SURGERY     LEFT HEART CATH AND CORONARY ANGIOGRAPHY N/A 02/21/2020   Procedure: LEFT HEART CATH AND CORONARY ANGIOGRAPHY;  Surgeon: Adrian Prows, MD;  Location: [facility];  Service: Cardiovascular;  Laterality: N/A;   REPLACEMENT TOTAL KNEE Left 06/05/12   TOTAL KNEE REVISION  06/03/2012   Procedure: TOTAL KNEE REVISION;  Surgeon: Gearlean Alf, MD;  Location: [facility];  Service: Orthopedics;  Laterality: Left;  Revision of a Left Uni Knee to a Total Knee Arthroplasty    FAMILY HISTORY: The patient's family history includes Asthma in her sister; Breast cancer in her sister; Diabetes in her mother; Heart attack in her brother; Heart failure in her father, mother, and sister; Thyroid disease in her mother.   SOCIAL HISTORY:  The patient  reports that she has never smoked. She has never used smokeless tobacco. She reports that she does not drink alcohol and does not use drugs.  Review of Systems  Constitutional: Negative  for chills and fever.  HENT:  Negative for hoarse voice and nosebleeds.   Eyes:  Negative for discharge, double vision and pain.  Cardiovascular:  Negative for chest pain, claudication, dyspnea on exertion, leg swelling, near-syncope, orthopnea, palpitations, paroxysmal nocturnal dyspnea and syncope.  Respiratory:  Positive for shortness of breath (chronic and stable). Negative for hemoptysis.   Musculoskeletal:  Negative for muscle cramps and myalgias.  Gastrointestinal:  Negative for abdominal pain, constipation, diarrhea, hematemesis, hematochezia, melena, nausea and vomiting.  Neurological:  Positive for dizziness. Negative for light-headedness and numbness.    PHYSICAL EXAM:    02/18/2022    2:48 PM 01/24/2022    1:11 PM 12/20/2021    3:45 PM  Vitals with BMI  Height  5' 6" 5' 6" 5' 6"  Weight 178 lbs 6 oz 174 lbs 176 lbs  BMI 28.81 41.9 37.90  Systolic 240 973 532  Diastolic 82 68 64  Pulse 75 71 74    CONSTITUTIONAL: Age-appropriate female, presents in a wheelchair, hemodynamically stable, well-nourished. No acute distress.  SKIN: Skin is warm and dry. No rash noted. No cyanosis. No pallor. No jaundice HEAD: Normocephalic and atraumatic.  EYES: No scleral icterus MOUTH/THROAT: Moist oral membranes.  NECK: No JVD present. No thyromegaly noted. No carotid bruits  CHEST Normal respiratory effort. No intercostal retractions  LUNGS: Clear to auscultation bilaterally no stridor. No wheezes. No rales.  CARDIOVASCULAR: Regular, positive S1-S2, no murmurs rubs or gallops appreciated. ABDOMINAL: Nonobese, soft, nontender, nondistended, positive bowel sounds in all 4 quadrants no apparent ascites.  EXTREMITIES: No pitting edema.  Brace over her left knee. HEMATOLOGIC: No significant bruising NEUROLOGIC: Oriented to person, place, and time. Nonfocal. Normal muscle tone.  PSYCHIATRIC: Normal mood and affect. Normal behavior. Cooperative  CARDIAC DATABASE: EKG: 02/18/2022: Sinus rhythm,  67 bpm, consider old anteroseptal infarct, without underlying injury pattern.    Echocardiogram: 05/26/2020:  Normal LV systolic function with visual EF 50-55%. Left ventricle cavity is normal in size. Normal global wall motion. Doppler evidence of grade I (impaired) diastolic dysfunction, normal LAP.  Left atrial cavity is mildly dilated.  Mild (Grade I) mitral regurgitation.  Mild to moderate tricuspid regurgitation. No evidence of pulmonary  hypertension. RVSP measures 33 mmHg.  Mild pulmonic regurgitation.  IVC is dilated with a respiratory response of >50%.  Compared to prior study dated 02/17/2020 LVEF has improved from 30-35% to 50-55%, Now grade I diastolic dysfunction, otherwise no other significant change.  Stress Testing:  NA  Heart Catheterization: Left Heart Catheterization 02/21/20:  There is severe left ventricular systolic dysfunction.  The left ventricular ejection fraction is less than 25% by visual estimate. LV end diastolic pressure is normal. Normal coronary arteries, right dominant circulation.  LABORATORY DATA:    Latest Ref Rng & Units 12/18/2021   11:44 PM 01/08/2021    5:04 PM 04/11/2020    4:04 PM  CBC  WBC 4.0 - 10.5 K/uL 5.7  7.4  5.1   Hemoglobin 12.0 - 15.0 g/dL 11.8  12.0  12.4   Hematocrit 36.0 - 46.0 % 35.0  37.1  37.3   Platelets 150 - 400 K/uL 288  355         Latest Ref Rng & Units 12/18/2021   11:44 PM 07/16/2021    5:18 PM 01/08/2021    7:36 PM  CMP  Glucose 70 - 99 mg/dL 85     BUN 8 - 23 mg/dL 19     Creatinine 0.44 - 1.00 mg/dL 1.10  1.40    Sodium 135 - 145 mmol/L 140     Potassium 3.5 - 5.1 mmol/L 4.3     Chloride 98 - 111 mmol/L 104     CO2 22 - 32 mmol/L 24     Calcium 8.9 - 10.3 mg/dL 9.3     Total Protein 6.5 - 8.1 g/dL 6.7   6.7   Total Bilirubin 0.3 - 1.2 mg/dL 0.6   0.4   Alkaline Phos 38 - 126 U/L 78   83   AST 15 - 41 U/L 16   16   ALT 0 - 44 U/L <5   5    Lipid Panel     Component Value Date/Time   CHOL 236 (H)  02/18/2020 0602   TRIG 47 02/18/2020 0602   HDL 65 02/18/2020 0602   CHOLHDL 3.6 02/18/2020 0602   VLDL 9 02/18/2020 0602   LDLCALC 162 (H) 02/18/2020 0602    No results found for: "HGBA1C" No components found for: "NTPROBNP" Lab Results  Component Value Date   TSH 1.862 02/18/2020    Cardiac Panel (last 3 results) No results for input(s): "CKTOTAL", "CKMB", "TROPONINIHS", "RELINDX" in the last 72 hours.  External Labs: Collected: June 09, 2020 available in Care Everywhere. Total cholesterol 155, triglycerides 97, HDL 65, LDL 71, non-HDL 90. Apolipoprotein B 95 (upper limit of normal 90). TSH 2.49. Free T4 1.3.  Hemoglobin 12.7 g/dL, hematocrit 39.2%. AST 16, ALT 6, alkaline phosphatase 104. Sodium 135, potassium 3.6, chloride 98, bicarb 25. BUN 15, creatinine 1.4  External Labs: Collected: October 19, 2021 PCP. BUN 26, creatinine 1.2. eGFR 44.3. Sodium 139, potassium 4.5, chloride 102, bicarb 32. AST 16, ALT 6, alkaline phosphatase 106. Hemoglobin 12.3 g/dL, hematocrit 36.1%. TSH 0.27 (reference range 0.40-4.2). Free T4 1.4 (within normal limits   IMPRESSION:    ICD-10-CM   1. Chronic heart failure with preserved ejection fraction (HFpEF) (HCC)  I50.32 EKG 12-Lead    2. Recovered nonischemic cardiomyopathy ([place])  I42.8     3. Mixed hyperlipidemia  E78.2     4. Hypertensive heart and kidney disease with HF and with CKD stage III ([place])  I13.0    N18.30        RECOMMENDATIONS: Yalda Herd is a 71 y.o. female whose past medical history and cardiovascular risk factors include: Chronic systolic and diastolic heart failure/stage C/NYHA class II, nonischemic cardiomyopathy, multiple system atrophy, asthma, history of left lower extremity DVT, hypertension, hyperlipidemia, hypothyroidism, left bundle branch block, advanced age, postmenopausal female.  Chronic heart failure with preserved ejection fraction (HFpEF) ([place]) / Recovered nonischemic cardiomyopathy  (HCC) Stage C, NYHA class II. Euvolemic on physical examination. Medications reconciled. Unable to uptitrate GDMT due to soft BP and hypotension given her multiple system atrophy. Strict I's and O's, daily weights. Prior echocardiogram noted LVEF of 30-35% and most recent LVEF 50-55%. Ischemic work-up: July 2021 left heart catheterization  Mixed hyperlipidemia Currently on pravastatin. Does not endorse myalgias.  Hypertensive heart and kidney disease with HF and with CKD stage III ([place]) Office blood pressures are well controlled. Continue current medical therapy. No changes warranted at this time.  Discussed management of at least 2 chronic comorbid conditions, outside labs from March 2023 independently reviewed and noted above, collateral history also obtained and discussed with her husband at today's office visit.  FINAL MEDICATION LIST END OF ENCOUNTER: No orders of the defined types were placed in this encounter.    There are no discontinued medications.     Current Outpatient Medications:    albuterol (PROVENTIL) (2.5 MG/3ML) 0.083% nebulizer solution, Take 3 mLs (2.5 mg total) by nebulization every 6 (six) hours as needed for wheezing or shortness of breath., Disp: 120 mL, Rfl: 11   Carbidopa-Levodopa ER (SINEMET CR) 25-100 MG tablet controlled release, 2 at 9am, 2 at noon, 2 at 3pm, 1 at 6pm, 2 q hs, Disp: 270 tablet, Rfl: 5   citalopram (CELEXA) 20 MG tablet, Take 20 mg by mouth daily., Disp: , Rfl:    gabapentin (NEURONTIN) 300 MG capsule, Take 1 capsule (300 mg total) by mouth 2 (two) times daily., Disp: 60 capsule, Rfl: 0   ipratropium (ATROVENT) 0.06 % nasal spray, SPRAY 2 SPRAYS IN EACH NOSTRIL EVERY 6  HOURS AS NEEDED FOR RUNNY NOSE, Disp: 45 mL, Rfl: 1   levothyroxine (SYNTHROID) 112 MCG tablet, Take 100 mcg by mouth daily., Disp: , Rfl:    LINZESS 290 MCG CAPS capsule, Take 290 mcg by mouth daily as needed., Disp: , Rfl:    metoprolol succinate (TOPROL XL) 25 MG 24  hr tablet, Take 0.5 tablets (12.5 mg total) by mouth daily. Hold if top blood pressure number less than 100 mmHg or pulse less than 60 bpm., Disp: 90 tablet, Rfl: 3   mometasone-formoterol (DULERA) 200-5 MCG/ACT AERO, Inhale 2 puffs into the lungs in the morning and at bedtime., Disp: 1 each, Rfl: 5   montelukast (SINGULAIR) 10 MG tablet, Take 1 tablet (10 mg total) by mouth daily., Disp: 30 tablet, Rfl: 0   Multiple Vitamin (MULTIVITAMIN WITH MINERALS) TABS tablet, Take 2 tablets by mouth daily., Disp: , Rfl:    pantoprazole (PROTONIX) 40 MG tablet, Take 1 tablet (40 mg total) by mouth 2 (two) times daily., Disp: 180 tablet, Rfl: 1   potassium chloride SA (KLOR-CON) 20 MEQ tablet, Take 1 tablet by mouth daily at 12 noon., Disp: , Rfl:    pravastatin (PRAVACHOL) 40 MG tablet, Take 1 tablet (40 mg total) by mouth daily at 6 PM., Disp: 30 tablet, Rfl: 0   predniSONE (DELTASONE) 20 MG tablet, , Disp: , Rfl:    sacubitril-valsartan (ENTRESTO) 24-26 MG, Take 1 tablet by mouth 2 (two) times daily., Disp: 180 tablet, Rfl: 3   Tiotropium Bromide Monohydrate (SPIRIVA RESPIMAT) 1.25 MCG/ACT AERS, Inhale 2 puffs into the lungs daily., Disp: 1 each, Rfl: 2   VOLTAREN 1 % GEL, Apply 1 application  topically 4 (four) times daily as needed (pain)., Disp: , Rfl:   Orders Placed This Encounter  Procedures   EKG 12-Lead    --Continue cardiac medications as reconciled in final medication list. --Return in about 6 months (around 08/21/2022) for Follow up recovered cardiomyopathy. . Or sooner if needed. --Continue follow-up with your primary care physician regarding the management of your other chronic comorbid conditions.  Patient's questions and concerns were addressed to her satisfaction. She voices understanding of the instructions provided during this encounter.   This note was created using a voice recognition software as a result there may be grammatical errors inadvertently enclosed that do not reflect the  nature of this encounter. Every attempt is made to correct such errors.   Rex Kras, [place], [facility]  Pager: [phone] Office: [phone]

## 2022-02-19 ENCOUNTER — Encounter: Payer: Self-pay | Admitting: Physical Therapy

## 2022-02-19 ENCOUNTER — Ambulatory Visit: Payer: Medicare Other | Admitting: Physical Therapy

## 2022-02-19 DIAGNOSIS — R2681 Unsteadiness on feet: Secondary | ICD-10-CM

## 2022-02-19 DIAGNOSIS — R42 Dizziness and giddiness: Secondary | ICD-10-CM

## 2022-02-19 DIAGNOSIS — M6281 Muscle weakness (generalized): Secondary | ICD-10-CM

## 2022-02-19 DIAGNOSIS — M62838 Other muscle spasm: Secondary | ICD-10-CM | POA: Diagnosis not present

## 2022-02-19 DIAGNOSIS — G232 Striatonigral degeneration: Secondary | ICD-10-CM | POA: Diagnosis not present

## 2022-02-22 ENCOUNTER — Encounter: Payer: Self-pay | Admitting: Physical Therapy

## 2022-02-22 ENCOUNTER — Ambulatory Visit: Payer: Medicare Other | Admitting: Physical Therapy

## 2022-02-22 DIAGNOSIS — M6281 Muscle weakness (generalized): Secondary | ICD-10-CM | POA: Diagnosis not present

## 2022-02-22 DIAGNOSIS — G232 Striatonigral degeneration: Secondary | ICD-10-CM | POA: Diagnosis not present

## 2022-02-22 DIAGNOSIS — R42 Dizziness and giddiness: Secondary | ICD-10-CM | POA: Diagnosis not present

## 2022-02-22 DIAGNOSIS — R2681 Unsteadiness on feet: Secondary | ICD-10-CM | POA: Diagnosis not present

## 2022-02-22 DIAGNOSIS — M62838 Other muscle spasm: Secondary | ICD-10-CM | POA: Diagnosis not present

## 2022-02-22 NOTE — Therapy (Signed)
OUTPATIENT PHYSICAL THERAPY NEURO TREATMENT   Patient Name: Rachel [name] MRN: [id number] DOB:1950/09/06, 71 y.o., female Today's Date: 02/22/2022   PCP: Ginger Organ., MD REFERRING PROVIDER: Ludwig Clarks, DO   PT End of Session - 02/22/22 1110     Visit Number 3    Number of Visits 17    Date for PT Re-Evaluation 04/09/22    Authorization Type BCBS Medicare    PT Start Time 1109    PT Stop Time 1149    PT Time Calculation (min) 40 min    Equipment Utilized During Treatment Gait belt    Activity Tolerance Patient tolerated treatment well    Behavior During Therapy WFL for tasks assessed/performed               Past Medical History:  Diagnosis Date   Allergic rhinitis    Anemia    Arthritis    Asthma    Blood clot in vein  age 22   left leg   Depression    GERD (gastroesophageal reflux disease)    Hyperlipidemia    Hypertension    pt denies, in physician record   Hypothyroidism    Kidney stones    Multiple pregnancy loss, not currently pregnant    multiple miscarriages-pt.unsure of #   Multiple system atrophy ([place]) 2015   Nephrolithiasis    hx of kidney stones, last 6 months ago   Osteopenia    Osteoporosis    Parkinsonism ([place])    Tendinitis    left foot   Vitamin D deficiency    Past Surgical History:  Procedure Laterality Date   BREAST EXCISIONAL BIOPSY Right    BREAST SURGERY     rt. lumpectomy, benign   colonscopy  2011   cystoscopy  2012   JOINT REPLACEMENT  05-2007   right knee replacment/revision   KIDNEY STONE SURGERY     LEFT HEART CATH AND CORONARY ANGIOGRAPHY N/A 02/21/2020   Procedure: LEFT HEART CATH AND CORONARY ANGIOGRAPHY;  Surgeon: Adrian Prows, MD;  Location: [facility];  Service: Cardiovascular;  Laterality: N/A;   REPLACEMENT TOTAL KNEE Left 06/05/12   TOTAL KNEE REVISION  06/03/2012   Procedure: TOTAL KNEE REVISION;  Surgeon: Gearlean Alf, MD;  Location: [facility];  Service: Orthopedics;  Laterality: Left;   Revision of a Left Uni Knee to a Total Knee Arthroplasty   Patient Active Problem List   Diagnosis Date Noted   Prolonged Q-T interval on ECG    Class 1 obesity due to excess calories with serious comorbidity and body mass index (BMI) of 34.0 to 34.9 in adult    Community acquired pneumonia of right lower lobe of lung 02/17/2020   Mild intermittent asthma with (acute) exacerbation 02/17/2020   Acute respiratory failure with hypoxia ([place]) 02/17/2020   Acute combined systolic and diastolic heart failure (HCC)    Cardiomyopathy (HCC)    Type 2 myocardial infarction ([place])    Elevated troponin    Sinus tachycardia    LBBB (left bundle branch block)    Shortness of breath    Hypertensive heart and kidney disease with HF and with CKD stage I-IV ([place])    Mixed hyperlipidemia    Hx of deep venous thrombosis    Multiple system atrophy, 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[place]) 11/19/2013   Thrush 06/15/2012   S/P total knee arthroplasty 06/15/2012   Fall 06/13/2012   Urinary incontinence 06/13/2012   Postop Acute blood loss anemia 06/04/2012  Postop Hypokalemia 06/04/2012   Pain due to unicompartmental arthroplasty of knee ([place]) 06/03/2012   GERD 03/18/2008   GI BLEED 03/18/2008   DYSPHAGIA 03/18/2008   FECAL OCCULT BLOOD 03/18/2008    ONSET DATE: past several years  REFERRING DIAG: G23.2 (ICD-10-CM) - Multiple system atrophy, 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HCC)  THERAPY DIAG:  Unsteadiness on feet  Muscle weakness (generalized)  Rationale for Evaluation and Treatment Rehabilitation  SUBJECTIVE:                                                                                                                                                                                              SUBJECTIVE STATEMENT: No changes, been trying to do the exercises. Pt accompanied by: self  PERTINENT HISTORY: anemia, asthma, depression, GERD, HLD, HTN, MSA, osteoporosis, Parkinsonism, B TKA  PAIN:  Are you having pain?  No  PRECAUTIONS: Fall  PATIENT GOALS improve strength  OBJECTIVE:    TODAY'S TREATMENT: 02/22/22 Activity Comments  Seated ankle dorsiflexion x 10, ankle plantarflexion x 10 reps       standing hip ABD 10x each LE, 8x with 2# each LE Cues to avoid anterior trunk lean      Standing heel raises x 10 reps,then minisquats to up on toes, 9 reps Fatigue at end of set, needs to sit  Stagger stance forward/back weightshifting at counter, x 10 reps each foot position; wide BOS lateral weightshifting x 10 reps BUE support and cues for weightshifting  Sit<>stand x 5 reps for HEP review, then additional 5-8 reps throughouts   Standing EOM with head turns/head nods, EO x 5 reps; then head steady EC x 10 sec, feet apart, feet together Close supervision, increased LUE tremor with head turns  128/70 with HR 87 bpm with activity in PT session (measured on pt's watch)  Pt performs PWR! Moves in sitting position x 10 reps   PWR! Up for improved posture, keeping hands on knees due to L shoulder discomfort with open wide arms  PWR! Rock for improved weighshifting-weightshifting through hips only  PWR! Twist for improved trunk rotation-modified looking over shoulder/return to midline, x 5 reps each side  PWR! Step for improved step initiation -single step out and in, over obstacle, x 5 reps each side  Cues provided for technique  Access Code: [email or web address] URL: [email or web address] Date: 02/22/2022-most recent updates Prepared by: [facility]  Exercises - Supine Bridge with Mini Swiss Ball Between Knees  - 1 x daily - 5 x weekly - 2 sets - 10 reps - Semi-Tandem Balance at [employer] Eyes Closed  -  1 x daily - 5 x weekly - 2 sets - 30 sec hold - Mini Squat with Counter Support  - 1 x daily - 5 x weekly - 2 sets - 10 reps - Standing Hip Abduction with Counter Support  - 1 x daily - 5 x weekly - 2 sets - 10 reps - Sit to Stand with Counter Support  -  1 x daily - 5 x weekly - 2 sets - 10 reps - Side to side weightshift  - 1 x daily - 5 x weekly - 1-2 sets - 10 reps - Staggered Stance Forward Backward Weight Shift with Counter Support  - 1 x daily - 5 x weekly - 1-2 sets - 10 reps  PATIENT EDUCATION: Education details: HEP additions Person educated: Patient Education method: Consulting civil engineer, Demonstration, and Handouts Education comprehension: verbalized understanding and returned demonstration   Below measures were taken at time of initial evaluation unless otherwise specified:   DIAGNOSTIC FINDINGS: none recent  COGNITION: Overall cognitive status: No family/caregiver present to determine baseline cognitive functioning  OBSERVATION: chorea throughout session; tendency for R lateral lean   SENSATION: WFL  COORDINATION: Alt pronation/supination B intact Finger to nose B intact Heel to shin B intact  MUSCLE TONE: slightly low tone in B LEs  POSTURE: rounded shoulders, forward head, increased thoracic kyphosis, and posterior pelvic tilt  LOWER EXTREMITY ROM:     Active  Right Eval Left Eval  Hip flexion    Hip extension    Hip abduction    Hip adduction    Hip internal rotation    Hip external rotation    Knee flexion    Knee extension    Ankle dorsiflexion 11 10  Ankle plantarflexion    Ankle inversion    Ankle eversion     (Blank rows = not tested)  LOWER EXTREMITY MMT:    MMT (in sitting) Right Eval Left Eval  Hip flexion 4 4  Hip extension    Hip abduction 4 4  Hip adduction 4- 4  Hip internal rotation    Hip external rotation    Knee flexion 4+ 4+  Knee extension 4+ 4  Ankle dorsiflexion 4 4  Ankle plantarflexion 4+ 4+  Ankle inversion    Ankle eversion    (Blank rows = not tested)  TRANSFERS: Sit to stand:  heavy reliance on UEs  Stand to sit:  when using walker, unsafe navigation/use of walker    GAIT: Gait pattern:  forward trunk lean throughout, ambulating with walker close to  BOS Assistive device utilized: Environmental consultant - 4 wheeled Level of assistance: Modified independence   FUNCTIONAL TESTs:         PATIENT EDUCATION: Education details: prognosis, POC, HEP; provided information on aquatic therapy and benefits of OT and ST Person educated: Patient Education method: Explanation, Demonstration, Tactile cues, Verbal cues, and Handouts Education comprehension: verbalized understanding   HOME EXERCISE PROGRAM: Access Code: [email or web address] URL: [email or web address] Date: 02/12/2022 Prepared by: [facility]  Exercises - Supine Bridge with Mini Swiss Ball Between Knees  - 1 x daily - 5 x weekly - 2 sets - 10 reps - Semi-Tandem Balance at Counter Top Eyes OPEN - 1 x daily - 5 x weekly - 2 sets - 30 sec hold - Mini Squat with Counter Support  - 1 x daily - 5 x weekly - 2 sets - 10 reps - Standing Hip Abduction with Counter Support  - 1  x daily - 5 x weekly - 2 sets - 10 reps - Sit to Stand with Counter Support  - 1 x daily - 5 x weekly - 2 sets - 10 reps    GOALS: Goals reviewed with patient? Yes  SHORT TERM GOALS: Target date: 03/05/2022  Patient to be independent with initial HEP. Baseline: HEP initiated Goal status: IN PROGRESS    LONG TERM GOALS: Target date: 04/09/2022  Patient to be independent with advanced HEP. Baseline: Not yet initiated  Goal status: IN PROGRESS  Patient to demonstrate floor to stand transfer with min-mod A.  Baseline: NT Goal status: IN PROGRESS  Patient to score at least 43/56 on Berg in order to decrease risk of falls.   Baseline: 37/56 Goal status: IN PROGRESS  Patient to demonstrate 5xSTS test in <15 sec with hands on knees in order to decrease risk of falls.   Baseline: 12 sec with B armrests Goal status: IN PROGRESS  Patient to verbalize understanding of fall prevention in home environment information. Baseline: Not yet initiated Goal status: IN  PROGRESS     ASSESSMENT:  CLINICAL IMPRESSION: Skilled PT session today focused on continued functional strength, balance, and posture.  Pt has increase in LUE tremors at end of session with challenge of static standing balance with head turns/nods, but overall, pt again reports feeling better and having less tremors at end of session.  Additions made to HEP for lateral and anterior/posterior weightshifting.  She will continue to benefit from skilled PT towards goals for improved overall functional mobility and decreased fall risk.  OBJECTIVE IMPAIRMENTS Abnormal gait, decreased balance, decreased strength, decreased safety awareness, dizziness, increased muscle spasms, impaired flexibility, impaired tone, improper body mechanics, postural dysfunction, and pain.   ACTIVITY LIMITATIONS carrying, lifting, bending, standing, squatting, stairs, transfers, bed mobility, bathing, dressing, reach over head, and locomotion level  PARTICIPATION LIMITATIONS: meal prep, cleaning, laundry, shopping, community activity, school, and church  PERSONAL FACTORS Age, Past/current experiences, Time since onset of injury/illness/exacerbation, and 3+ comorbidities: anemia, asthma, depression, GERD, HLD, HTN, MSA, osteoporosis, Parkinsonism, B TKA  are also affecting patient's functional outcome.   REHAB POTENTIAL: Good  CLINICAL DECISION MAKING: Evolving/moderate complexity  EVALUATION COMPLEXITY: Moderate  PLAN: PT FREQUENCY: 2x/week  PT DURATION: 8 weeks  PLANNED INTERVENTIONS: Therapeutic exercises, Therapeutic activity, Neuromuscular re-education, Balance training, Gait training, Patient/Family education, Self Care, Joint mobilization, Stair training, Vestibular training, Canalith repositioning, Aquatic Therapy, Dry Needling, Electrical stimulation, Cryotherapy, Moist heat, Taping, Manual therapy, and Re-evaluation  PLAN FOR NEXT SESSION: reassess updates to HEP, progress hip strengthening, static and  dynamic balance    Mady Haagensen, PT 02/22/22 11:59 AM Phone: [phone] Fax: [phone]   [facility] [address], [place], [place] [address] Phone # [phone] Fax # [phone]

## 2022-02-25 NOTE — Therapy (Signed)
OUTPATIENT PHYSICAL THERAPY NEURO TREATMENT   Patient Name: Rachel [name] MRN: [id number] DOB:Oct 16, 1950, 71 y.o., female Today's Date: 02/26/2022   PCP: Ginger Organ., MD REFERRING PROVIDER: Ludwig Clarks, DO   PT End of Session - 02/26/22 1517     Visit Number 4    Number of Visits 17    Date for PT Re-Evaluation 04/09/22    Authorization Type BCBS Medicare    PT Start Time 1446    PT Stop Time 1514    PT Time Calculation (min) 28 min    Activity Tolerance Patient tolerated treatment well    Behavior During Therapy WFL for tasks assessed/performed                Past Medical History:  Diagnosis Date   Allergic rhinitis    Anemia    Arthritis    Asthma    Blood clot in vein  age 62   left leg   Depression    GERD (gastroesophageal reflux disease)    Hyperlipidemia    Hypertension    pt denies, in physician record   Hypothyroidism    Kidney stones    Multiple pregnancy loss, not currently pregnant    multiple miscarriages-pt.unsure of #   Multiple system atrophy ([place]) 2015   Nephrolithiasis    hx of kidney stones, last 6 months ago   Osteopenia    Osteoporosis    Parkinsonism ([place])    Tendinitis    left foot   Vitamin D deficiency    Past Surgical History:  Procedure Laterality Date   BREAST EXCISIONAL BIOPSY Right    BREAST SURGERY     rt. lumpectomy, benign   colonscopy  2011   cystoscopy  2012   JOINT REPLACEMENT  05-2007   right knee replacment/revision   KIDNEY STONE SURGERY     LEFT HEART CATH AND CORONARY ANGIOGRAPHY N/A 02/21/2020   Procedure: LEFT HEART CATH AND CORONARY ANGIOGRAPHY;  Surgeon: Adrian Prows, MD;  Location: [facility];  Service: Cardiovascular;  Laterality: N/A;   REPLACEMENT TOTAL KNEE Left 06/05/12   TOTAL KNEE REVISION  06/03/2012   Procedure: TOTAL KNEE REVISION;  Surgeon: Gearlean Alf, MD;  Location: [facility];  Service: Orthopedics;  Laterality: Left;  Revision of a Left Uni Knee to a Total Knee  Arthroplasty   Patient Active Problem List   Diagnosis Date Noted   Prolonged Q-T interval on ECG    Class 1 obesity due to excess calories with serious comorbidity and body mass index (BMI) of 34.0 to 34.9 in adult    Community acquired pneumonia of right lower lobe of lung 02/17/2020   Mild intermittent asthma with (acute) exacerbation 02/17/2020   Acute respiratory failure with hypoxia ([place]) 02/17/2020   Acute combined systolic and diastolic heart failure (HCC)    Cardiomyopathy (HCC)    Type 2 myocardial infarction ([place])    Elevated troponin    Sinus tachycardia    LBBB (left bundle branch block)    Shortness of breath    Hypertensive heart and kidney disease with HF and with CKD stage I-IV ([place])    Mixed hyperlipidemia    Hx of deep venous thrombosis    Multiple system atrophy, 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[place]) 11/19/2013   Thrush 06/15/2012   S/P total knee arthroplasty 06/15/2012   Fall 06/13/2012   Urinary incontinence 06/13/2012   Postop Acute blood loss anemia 06/04/2012   Postop Hypokalemia 06/04/2012   Pain due to  unicompartmental arthroplasty of knee ([place]) 06/03/2012   GERD 03/18/2008   GI BLEED 03/18/2008   DYSPHAGIA 03/18/2008   FECAL OCCULT BLOOD 03/18/2008    ONSET DATE: past several years  REFERRING DIAG: G23.2 (ICD-10-CM) - Multiple system atrophy, 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HCC)  THERAPY DIAG:  Unsteadiness on feet  Muscle weakness (generalized)  Dizziness and giddiness  Other muscle spasm  Rationale for Evaluation and Treatment Rehabilitation  SUBJECTIVE:                                                                                                                                                                                              SUBJECTIVE STATEMENT: 3-4 days ago she physically cannot see straight without her glasses. Now everything is making her dizzy. Feeling like these symptoms are getting worse and plans to go see a doctor. Denies, dysphagia,  N/T, or new weakness. Does report HAs.  Pt accompanied by: self  PERTINENT HISTORY: anemia, asthma, depression, GERD, HLD, HTN, MSA, osteoporosis, Parkinsonism, B TKA  PAIN:  Are you having pain? No  PRECAUTIONS: Fall  PATIENT GOALS improve strength  OBJECTIVE:   Vitals: 97% spO2, 70bpm, 111/70 mmHg  *L eye exotropia at rest   VOMS Notes HA 0-10 Dizziness 0-10 Nausea 0-10 Fogginess 0-10  BASELINE   0    Smooth pursuit normal  8/10    Saccades-horiz normal  0    Saccades-vert 1 saccade   0    Convergence ~1.5 feet away  0    VOR-horiz slow  0    VOR-vert C/o some dipolpia  1    Visual motion sensitivity   0     HIT: Positive R Negative L  Test of skew: negative   PATIENT EDUCATION: Education details: educated patient and husband on exam findings and supported patient's decision to reach out to MD Person educated: Patient and Spouse Education method: Explanation and Demonstration Education comprehension: verbalized understanding and returned demonstration    Access Code: [email or web address] URL: [email or web address] Date: 02/22/2022-most recent updates Prepared by: [facility]  Exercises - Supine Bridge with Mini Swiss Ball Between Knees  - 1 x daily - 5 x weekly - 2 sets - 10 reps - Semi-Tandem Balance at Counter Top Eyes Closed  - 1 x daily - 5 x weekly - 2 sets - 30 sec hold - Mini Squat with Counter Support  - 1 x daily - 5 x weekly - 2 sets - 10 reps - Standing Hip Abduction with Counter Support  - 1 x daily - 5 x weekly - 2 sets - 10 reps -  Sit to Stand with Counter Support  - 1 x daily - 5 x weekly - 2 sets - 10 reps - Side to side weightshift  - 1 x daily - 5 x weekly - 1-2 sets - 10 reps - Staggered Stance Forward Backward Weight Shift with Counter Support  - 1 x daily - 5 x weekly - 1-2 sets - 10 reps   Below measures were taken at time of initial evaluation unless otherwise specified:   DIAGNOSTIC FINDINGS:  none recent  COGNITION: Overall cognitive status: No family/caregiver present to determine baseline cognitive functioning  OBSERVATION: chorea throughout session; tendency for R lateral lean   SENSATION: WFL  COORDINATION: Alt pronation/supination B intact Finger to nose B intact Heel to shin B intact  MUSCLE TONE: slightly low tone in B LEs  POSTURE: rounded shoulders, forward head, increased thoracic kyphosis, and posterior pelvic tilt  LOWER EXTREMITY ROM:     Active  Right Eval Left Eval  Hip flexion    Hip extension    Hip abduction    Hip adduction    Hip internal rotation    Hip external rotation    Knee flexion    Knee extension    Ankle dorsiflexion 11 10  Ankle plantarflexion    Ankle inversion    Ankle eversion     (Blank rows = not tested)  LOWER EXTREMITY MMT:    MMT (in sitting) Right Eval Left Eval  Hip flexion 4 4  Hip extension    Hip abduction 4 4  Hip adduction 4- 4  Hip internal rotation    Hip external rotation    Knee flexion 4+ 4+  Knee extension 4+ 4  Ankle dorsiflexion 4 4  Ankle plantarflexion 4+ 4+  Ankle inversion    Ankle eversion    (Blank rows = not tested)  TRANSFERS: Sit to stand:  heavy reliance on UEs  Stand to sit:  when using walker, unsafe navigation/use of walker    GAIT: Gait pattern:  forward trunk lean throughout, ambulating with walker close to BOS Assistive device utilized: Environmental consultant - 4 wheeled Level of assistance: Modified independence   FUNCTIONAL TESTs:         PATIENT EDUCATION: Education details: prognosis, POC, HEP; provided information on aquatic therapy and benefits of OT and ST Person educated: Patient Education method: Explanation, Demonstration, Tactile cues, Verbal cues, and Handouts Education comprehension: verbalized understanding   HOME EXERCISE PROGRAM: Access Code: [email or web address] URL: [email or web address] Date: 02/12/2022 Prepared by: [facility]  Exercises - Supine Bridge with Mini Swiss Ball Between Knees  - 1 x daily - 5 x weekly - 2 sets - 10 reps - Semi-Tandem Balance at Counter Top Eyes OPEN - 1 x daily - 5 x weekly - 2 sets - 30 sec hold - Mini Squat with Counter Support  - 1 x daily - 5 x weekly - 2 sets - 10 reps - Standing Hip Abduction with Counter Support  - 1 x daily - 5 x weekly - 2 sets - 10 reps - Sit to Stand with Counter Support  - 1 x daily - 5 x weekly - 2 sets - 10 reps    GOALS: Goals reviewed with patient? Yes  SHORT TERM GOALS: Target date: 03/05/2022  Patient to be independent with initial HEP. Baseline: HEP initiated Goal status: IN PROGRESS    LONG TERM GOALS: Target date: 04/09/2022  Patient to be independent with advanced  HEP. Baseline: Not yet initiated  Goal status: IN PROGRESS  Patient to demonstrate floor to stand transfer with min-mod A.  Baseline: NT Goal status: IN PROGRESS  Patient to score at least 43/56 on Berg in order to decrease risk of falls.   Baseline: 37/56 Goal status: IN PROGRESS  Patient to demonstrate 5xSTS test in <15 sec with hands on knees in order to decrease risk of falls.   Baseline: 12 sec with B armrests Goal status: IN PROGRESS  Patient to verbalize understanding of fall prevention in home environment information. Baseline: Not yet initiated Goal status: IN PROGRESS     ASSESSMENT:  CLINICAL IMPRESSION: Patient arrived to session with report of feeling unwell for the past couple of days. Notes sensation of "I can't see straight" and dizziness which has gotten worse over the past couple of days. Denies, dysphagia, N/T, or new weakness. Does report HAs. Vitals normal, thus proceeded with oculomotor assessment. Patient without any red flag signs or CNS signs during exam. Only significant sign was positive R HIT. As patient was feeling unwell during session and wanted to f/u with her MD about these symptoms, ended session early today.    OBJECTIVE IMPAIRMENTS Abnormal gait, decreased balance, decreased strength, decreased safety awareness, dizziness, increased muscle spasms, impaired flexibility, impaired tone, improper body mechanics, postural dysfunction, and pain.   ACTIVITY LIMITATIONS carrying, lifting, bending, standing, squatting, stairs, transfers, bed mobility, bathing, dressing, reach over head, and locomotion level  PARTICIPATION LIMITATIONS: meal prep, cleaning, laundry, shopping, community activity, school, and church  PERSONAL FACTORS Age, Past/current experiences, Time since onset of injury/illness/exacerbation, and 3+ comorbidities: anemia, asthma, depression, GERD, HLD, HTN, MSA, osteoporosis, Parkinsonism, B TKA  are also affecting patient's functional outcome.   REHAB POTENTIAL: Good  CLINICAL DECISION MAKING: Evolving/moderate complexity  EVALUATION COMPLEXITY: Moderate  PLAN: PT FREQUENCY: 2x/week  PT DURATION: 8 weeks  PLANNED INTERVENTIONS: Therapeutic exercises, Therapeutic activity, Neuromuscular re-education, Balance training, Gait training, Patient/Family education, Self Care, Joint mobilization, Stair training, Vestibular training, Canalith repositioning, Aquatic Therapy, Dry Needling, Electrical stimulation, Cryotherapy, Moist heat, Taping, Manual therapy, and Re-evaluation  PLAN FOR NEXT SESSION: reassess updates to HEP, progress hip strengthening, static and dynamic balance    [name], PT, DPT 02/26/22 3:25 PM  [facility] at [facility] [address] [place], [place] [address] Phone # [phone] Fax # [phone]

## 2022-02-26 ENCOUNTER — Encounter: Payer: Self-pay | Admitting: Physical Therapy

## 2022-02-26 ENCOUNTER — Ambulatory Visit: Payer: Medicare Other | Attending: Neurology | Admitting: Physical Therapy

## 2022-02-26 DIAGNOSIS — M6281 Muscle weakness (generalized): Secondary | ICD-10-CM | POA: Diagnosis not present

## 2022-02-26 DIAGNOSIS — G232 Striatonigral degeneration: Secondary | ICD-10-CM | POA: Diagnosis not present

## 2022-02-26 DIAGNOSIS — M62838 Other muscle spasm: Secondary | ICD-10-CM | POA: Diagnosis not present

## 2022-02-26 DIAGNOSIS — R42 Dizziness and giddiness: Secondary | ICD-10-CM | POA: Insufficient documentation

## 2022-02-26 DIAGNOSIS — R2681 Unsteadiness on feet: Secondary | ICD-10-CM | POA: Insufficient documentation

## 2022-02-27 NOTE — Therapy (Signed)
OUTPATIENT PHYSICAL THERAPY NEURO TREATMENT   Patient Name: Rachel [name] MRN: [id number] DOB:[birth year], 71 y.o., female Today's Date: 02/28/2022   PCP: Ginger Organ., MD REFERRING PROVIDER: Ludwig Clarks, DO   PT End of Session - 02/28/22 1711     Visit Number 5    Number of Visits 17    Date for PT Re-Evaluation 04/09/22    Authorization Type BCBS Medicare    PT Start Time 1450    PT Stop Time 3704    PT Time Calculation (min) 40 min    Equipment Utilized During Treatment Gait belt    Activity Tolerance Patient tolerated treatment well    Behavior During Therapy WFL for tasks assessed/performed                 Past Medical History:  Diagnosis Date   Allergic rhinitis    Anemia    Arthritis    Asthma    Blood clot in vein  age 74   left leg   Depression    GERD (gastroesophageal reflux disease)    Hyperlipidemia    Hypertension    pt denies, in physician record   Hypothyroidism    Kidney stones    Multiple pregnancy loss, not currently pregnant    multiple miscarriages-pt.unsure of #   Multiple system atrophy ([place]) 2015   Nephrolithiasis    hx of kidney stones, last 6 months ago   Osteopenia    Osteoporosis    Parkinsonism ([place])    Tendinitis    left foot   Vitamin D deficiency    Past Surgical History:  Procedure Laterality Date   BREAST EXCISIONAL BIOPSY Right    BREAST SURGERY     rt. lumpectomy, benign   colonscopy  2011   cystoscopy  2012   JOINT REPLACEMENT  05-2007   right knee replacment/revision   KIDNEY STONE SURGERY     LEFT HEART CATH AND CORONARY ANGIOGRAPHY N/A 02/21/2020   Procedure: LEFT HEART CATH AND CORONARY ANGIOGRAPHY;  Surgeon: Adrian Prows, MD;  Location: [facility];  Service: Cardiovascular;  Laterality: N/A;   REPLACEMENT TOTAL KNEE Left 06/05/12   TOTAL KNEE REVISION  06/03/2012   Procedure: TOTAL KNEE REVISION;  Surgeon: Gearlean Alf, MD;  Location: [facility];  Service: Orthopedics;  Laterality: Left;   Revision of a Left Uni Knee to a Total Knee Arthroplasty   Patient Active Problem List   Diagnosis Date Noted   Prolonged Q-T interval on ECG    Class 1 obesity due to excess calories with serious comorbidity and body mass index (BMI) of 34.0 to 34.9 in adult    Community acquired pneumonia of right lower lobe of lung 02/17/2020   Mild intermittent asthma with (acute) exacerbation 02/17/2020   Acute respiratory failure with hypoxia ([place]) 02/17/2020   Acute combined systolic and diastolic heart failure (HCC)    Cardiomyopathy (HCC)    Type 2 myocardial infarction ([place])    Elevated troponin    Sinus tachycardia    LBBB (left bundle branch block)    Shortness of breath    Hypertensive heart and kidney disease with HF and with CKD stage I-IV ([place])    Mixed hyperlipidemia    Hx of deep venous thrombosis    Multiple system atrophy, 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[place]) 11/19/2013   Thrush 06/15/2012   S/P total knee arthroplasty 06/15/2012   Fall 06/13/2012   Urinary incontinence 06/13/2012   Postop Acute blood loss anemia 06/04/2012  Postop Hypokalemia 06/04/2012   Pain due to unicompartmental arthroplasty of knee ([place]) 06/03/2012   GERD 03/18/2008   GI BLEED 03/18/2008   DYSPHAGIA 03/18/2008   FECAL OCCULT BLOOD 03/18/2008    ONSET DATE: past several years  REFERRING DIAG: G23.2 (ICD-10-CM) - Multiple system atrophy, 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HCC)  THERAPY DIAG:  Unsteadiness on feet  Muscle weakness (generalized)  Dizziness and giddiness  Other muscle spasm  Rationale for Evaluation and Treatment Rehabilitation  SUBJECTIVE:                                                                                                                                                                                              SUBJECTIVE STATEMENT: Feeling better but eye sight is not any better. Forgot to reach out to Dr. Carles Collet. Dizziness is not as bad today or yesterday.  Pt accompanied by:  self  PERTINENT HISTORY: anemia, asthma, depression, GERD, HLD, HTN, MSA, osteoporosis, Parkinsonism, B TKA  PAIN:  Are you having pain? No  PRECAUTIONS: Fall  PATIENT GOALS improve strength  OBJECTIVE:    TODAY'S TREATMENT: 02/28/22    Orthostatic Testing   Supine Sitting Standing  x1 Minute Standing x 3 Minutes  BP 150/78 143/80 *lightheaded 131/72 *lightheaded 135/76  HR 62 62 67 64    Activity Comments  lateral weight shift Cues to dip in the center and relax grip on counter top  staggered anterior/posterior weight shift Heavy verbal and manual cueing to encourage coordination of movement  R/L fwd/back stepping With 1 UE support on counter and CGA; increased weakness in L LE evident     PATIENT EDUCATION: Education details: provided information and handouts on addressing orthostatic hypotension; wrote patient's husband a note on need for patient to F/U with Dr. Carles Collet d/t increased trouble with dizziness and blurred vision and for need to have him be present at sessions d/t patient's report of her poor memory.  Person educated: Patient Education method: Explanation, Demonstration, Tactile cues, Verbal cues, and Handouts Education comprehension: verbalized understanding and returned demonstration    Access Code: [email or web address] URL: [email or web address] Date: 02/22/2022-most recent updates Prepared by: [facility]  Exercises - Supine Bridge with Mini Swiss Ball Between Knees  - 1 x daily - 5 x weekly - 2 sets - 10 reps - Semi-Tandem Balance at Counter Top Eyes Closed  - 1 x daily - 5 x weekly - 2 sets - 30 sec hold - Mini Squat with Counter Support  - 1 x daily - 5 x weekly - 2 sets - 10 reps - Standing Hip Abduction with  Counter Support  - 1 x daily - 5 x weekly - 2 sets - 10 reps - Sit to Stand with Counter Support  - 1 x daily - 5 x weekly - 2 sets - 10 reps - Side to side weightshift  - 1 x daily - 5 x weekly - 1-2  sets - 10 reps - Staggered Stance Forward Backward Weight Shift with Counter Support  - 1 x daily - 5 x weekly - 1-2 sets - 10 reps   Below measures were taken at time of initial evaluation unless otherwise specified:   DIAGNOSTIC FINDINGS: none recent  COGNITION: Overall cognitive status: No family/caregiver present to determine baseline cognitive functioning  OBSERVATION: chorea throughout session; tendency for R lateral lean   SENSATION: WFL  COORDINATION: Alt pronation/supination B intact Finger to nose B intact Heel to shin B intact  MUSCLE TONE: slightly low tone in B LEs  POSTURE: rounded shoulders, forward head, increased thoracic kyphosis, and posterior pelvic tilt  LOWER EXTREMITY ROM:     Active  Right Eval Left Eval  Hip flexion    Hip extension    Hip abduction    Hip adduction    Hip internal rotation    Hip external rotation    Knee flexion    Knee extension    Ankle dorsiflexion 11 10  Ankle plantarflexion    Ankle inversion    Ankle eversion     (Blank rows = not tested)  LOWER EXTREMITY MMT:    MMT (in sitting) Right Eval Left Eval  Hip flexion 4 4  Hip extension    Hip abduction 4 4  Hip adduction 4- 4  Hip internal rotation    Hip external rotation    Knee flexion 4+ 4+  Knee extension 4+ 4  Ankle dorsiflexion 4 4  Ankle plantarflexion 4+ 4+  Ankle inversion    Ankle eversion    (Blank rows = not tested)  TRANSFERS: Sit to stand:  heavy reliance on UEs  Stand to sit:  when using walker, unsafe navigation/use of walker    GAIT: Gait pattern:  forward trunk lean throughout, ambulating with walker close to BOS Assistive device utilized: Environmental consultant - 4 wheeled Level of assistance: Modified independence   FUNCTIONAL TESTs:         PATIENT EDUCATION: Education details: prognosis, POC, HEP; provided information on aquatic therapy and benefits of OT and ST Person educated: Patient Education method: Explanation,  Demonstration, Tactile cues, Verbal cues, and Handouts Education comprehension: verbalized understanding   HOME EXERCISE PROGRAM: Access Code: [email or web address] URL: [email or web address] Date: 02/12/2022 Prepared by: [facility]  Exercises - Supine Bridge with Mini Swiss Ball Between Knees  - 1 x daily - 5 x weekly - 2 sets - 10 reps - Semi-Tandem Balance at Counter Top Eyes OPEN - 1 x daily - 5 x weekly - 2 sets - 30 sec hold - Mini Squat with Counter Support  - 1 x daily - 5 x weekly - 2 sets - 10 reps - Standing Hip Abduction with Counter Support  - 1 x daily - 5 x weekly - 2 sets - 10 reps - Sit to Stand with Counter Support  - 1 x daily - 5 x weekly - 2 sets - 10 reps    GOALS: Goals reviewed with patient? Yes  SHORT TERM GOALS: Target date: 03/05/2022  Patient to be independent with initial HEP. Baseline: HEP initiated Goal status:  IN PROGRESS    LONG TERM GOALS: Target date: 04/09/2022  Patient to be independent with advanced HEP. Baseline: Not yet initiated  Goal status: IN PROGRESS  Patient to demonstrate floor to stand transfer with min-mod A.  Baseline: NT Goal status: IN PROGRESS  Patient to score at least 43/56 on Berg in order to decrease risk of falls.   Baseline: 37/56 Goal status: IN PROGRESS  Patient to demonstrate 5xSTS test in <15 sec with hands on knees in order to decrease risk of falls.   Baseline: 12 sec with B armrests Goal status: IN PROGRESS  Patient to verbalize understanding of fall prevention in home environment information. Baseline: Not yet initiated Goal status: IN PROGRESS     ASSESSMENT:  CLINICAL IMPRESSION: Patient arrived to session with report of decreased dizziness since last session but reports no improvement in her eye sight. Patient did not reach out to Dr. Carles Collet or any MD about her symptoms despite PT's recommendation; notes that she forgot. Checked orthostatics today which  revealed c/o lightheadedness with transitions and pretty significant drop in BP between supine and standing BP. Educated patient on techniques to address orthostasis and wrote them down for improved carryover. End of session focused on dynamic balance activities with heavy cues for proper positioning and foot placement. Patient tolerated session well and without complaints upon leaving.   OBJECTIVE IMPAIRMENTS Abnormal gait, decreased balance, decreased strength, decreased safety awareness, dizziness, increased muscle spasms, impaired flexibility, impaired tone, improper body mechanics, postural dysfunction, and pain.   ACTIVITY LIMITATIONS carrying, lifting, bending, standing, squatting, stairs, transfers, bed mobility, bathing, dressing, reach over head, and locomotion level  PARTICIPATION LIMITATIONS: meal prep, cleaning, laundry, shopping, community activity, school, and church  PERSONAL FACTORS Age, Past/current experiences, Time since onset of injury/illness/exacerbation, and 3+ comorbidities: anemia, asthma, depression, GERD, HLD, HTN, MSA, osteoporosis, Parkinsonism, B TKA  are also affecting patient's functional outcome.   REHAB POTENTIAL: Good  CLINICAL DECISION MAKING: Evolving/moderate complexity  EVALUATION COMPLEXITY: Moderate  PLAN: PT FREQUENCY: 2x/week  PT DURATION: 8 weeks  PLANNED INTERVENTIONS: Therapeutic exercises, Therapeutic activity, Neuromuscular re-education, Balance training, Gait training, Patient/Family education, Self Care, Joint mobilization, Stair training, Vestibular training, Canalith repositioning, Aquatic Therapy, Dry Needling, Electrical stimulation, Cryotherapy, Moist heat, Taping, Manual therapy, and Re-evaluation  PLAN FOR NEXT SESSION: reassess updates to HEP, progress hip strengthening, static and dynamic balance    [name], PT, DPT 02/28/22 5:19 PM  [facility] [address] [place], [place] [address] Phone # [phone] Fax # [phone]

## 2022-02-28 ENCOUNTER — Encounter: Payer: Self-pay | Admitting: Physical Therapy

## 2022-02-28 ENCOUNTER — Ambulatory Visit: Payer: Medicare Other | Admitting: Physical Therapy

## 2022-02-28 DIAGNOSIS — R42 Dizziness and giddiness: Secondary | ICD-10-CM

## 2022-02-28 DIAGNOSIS — G232 Striatonigral degeneration: Secondary | ICD-10-CM | POA: Diagnosis not present

## 2022-02-28 DIAGNOSIS — R2681 Unsteadiness on feet: Secondary | ICD-10-CM

## 2022-02-28 DIAGNOSIS — M6281 Muscle weakness (generalized): Secondary | ICD-10-CM | POA: Diagnosis not present

## 2022-02-28 DIAGNOSIS — M62838 Other muscle spasm: Secondary | ICD-10-CM | POA: Diagnosis not present

## 2022-03-04 NOTE — Therapy (Signed)
OUTPATIENT PHYSICAL THERAPY NEURO TREATMENT   Patient Name: Rachel [name] MRN: [id number] DOB:Jan 27, 1951, 71 y.o., female Today's Date: 03/05/2022   PCP: Ginger Organ., MD REFERRING PROVIDER: Ludwig Clarks, DO   PT End of Session - 03/05/22 1537     Visit Number 6    Number of Visits 17    Date for PT Re-Evaluation 04/09/22    Authorization Type BCBS Medicare    PT Start Time 6294    PT Stop Time 1531    PT Time Calculation (min) 40 min    Equipment Utilized During Treatment Gait belt    Activity Tolerance Patient tolerated treatment well    Behavior During Therapy WFL for tasks assessed/performed                  Past Medical History:  Diagnosis Date   Allergic rhinitis    Anemia    Arthritis    Asthma    Blood clot in vein  age 62   left leg   Depression    GERD (gastroesophageal reflux disease)    Hyperlipidemia    Hypertension    pt denies, in physician record   Hypothyroidism    Kidney stones    Multiple pregnancy loss, not currently pregnant    multiple miscarriages-pt.unsure of #   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 lumpectomy, benign   colonscopy  2011   cystoscopy  2012   JOINT REPLACEMENT  05-2007   right knee replacment/revision   KIDNEY STONE SURGERY     LEFT HEART CATH AND CORONARY ANGIOGRAPHY N/A 02/21/2020   Procedure: LEFT HEART CATH AND CORONARY ANGIOGRAPHY;  Surgeon: Adrian Prows, MD;  Location: [facility];  Service: Cardiovascular;  Laterality: N/A;   REPLACEMENT TOTAL KNEE Left 06/05/12   TOTAL KNEE REVISION  06/03/2012   Procedure: TOTAL KNEE REVISION;  Surgeon: Gearlean Alf, MD;  Location: [facility];  Service: Orthopedics;  Laterality: Left;   Revision of a Left Uni Knee to a Total Knee Arthroplasty   Patient Active Problem List   Diagnosis Date Noted   Prolonged Q-T interval on ECG    Class 1 obesity due to excess calories with serious comorbidity and body mass index (BMI) of 34.0 to 34.9 in adult    Community acquired pneumonia of right lower lobe of lung 02/17/2020   Mild intermittent asthma with (acute) exacerbation 02/17/2020   Acute respiratory failure with hypoxia ([place]) 02/17/2020   Acute combined systolic and diastolic heart failure (HCC)    Cardiomyopathy (HCC)    Type 2 myocardial infarction ([place])    Elevated troponin    Sinus tachycardia    LBBB (left bundle branch block)    Shortness of breath    Hypertensive heart and kidney disease with HF and with CKD stage I-IV ([place])    Mixed hyperlipidemia    Hx of deep venous thrombosis    Multiple system atrophy, 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[place]) 11/19/2013   Thrush 06/15/2012   S/P total knee arthroplasty 06/15/2012   Fall 06/13/2012   Urinary incontinence 06/13/2012   Postop Acute blood loss anemia  06/04/2012   Postop Hypokalemia 06/04/2012   Pain due to unicompartmental arthroplasty of knee ([place]) 06/03/2012   GERD 03/18/2008   GI BLEED 03/18/2008   DYSPHAGIA 03/18/2008   FECAL OCCULT BLOOD 03/18/2008    ONSET DATE: past several years  REFERRING DIAG: G23.2 (ICD-10-CM) - Multiple system atrophy, 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HCC)  THERAPY DIAG:  Unsteadiness on feet  Muscle weakness (generalized)  Dizziness and giddiness  Other muscle spasm  Rationale for Evaluation and Treatment Rehabilitation  SUBJECTIVE:                                                                                                                                                                                              SUBJECTIVE STATEMENT: HEP is going well. Has not reached out to Dr. Carles Collet about her blurred vision but plans to make an appointment with eye Dr.  Pt accompanied by: self,  husband  PERTINENT HISTORY: anemia, asthma, depression, GERD, HLD, HTN, MSA, osteoporosis, Parkinsonism, B TKA  PAIN:  Are you having pain? Yes- c/o mild LBP  PAIN:  Are you having pain? Yes: NPRS scale: mild/10 Pain location: LB Aggravating factors: standing, walking    PRECAUTIONS: Fall  PATIENT GOALS improve strength  OBJECTIVE:     TODAY'S TREATMENT: 03/05/22 Activity Comments  Standing red TB row 2x10 Cues for core contraction  Standing red TB extension 2x10 Cues for core contraction and avoiding valsalva  R/L paloff press with red TB 10x each side   good carryover after demonstration  STS 5x, 5x with green medball at chest Cues to scoot forward and tuck feet back  fwd/back stepping over 1/2 foam roll 10x each with 1 UE support on counter  Limited L knee flexion during backwards step   romberg EO/EC 30" Mild sway  Romberg EC + head turn/nods 30" each Moderate sway     PATIENT EDUCATION: Education details: reviewed techniques to address orthostasis, encouraged appointment with ophthalmologist to address diplopia   Person educated: Patient and Spouse Education method: Explanation and Demonstration Education comprehension: verbalized understanding and returned demonstration   Access Code: [email or web address] URL: [email or web address] Date: 02/22/2022-most recent updates Prepared by: [facility]  Exercises - Supine Bridge with Mini Swiss Ball Between Knees  - 1 x daily - 5 x weekly - 2 sets - 10 reps - Semi-Tandem Balance at Counter Top Eyes Closed  - 1 x daily - 5 x weekly - 2 sets - 30 sec hold - Mini Squat with Counter Support  - 1 x daily - 5 x weekly - 2 sets - 10 reps - Standing  Hip Abduction with Counter Support  - 1 x daily - 5 x weekly - 2 sets - 10 reps - Sit to Stand with Counter Support  - 1 x daily - 5 x weekly - 2 sets - 10 reps - Side to side weightshift  - 1 x daily - 5 x weekly - 1-2 sets - 10 reps -  Staggered Stance Forward Backward Weight Shift with Counter Support  - 1 x daily - 5 x weekly - 1-2 sets - 10 reps   Below measures were taken at time of initial evaluation unless otherwise specified:   DIAGNOSTIC FINDINGS: none recent  COGNITION: Overall cognitive status: No family/caregiver present to determine baseline cognitive functioning  OBSERVATION: chorea throughout session; tendency for R lateral lean   SENSATION: WFL  COORDINATION: Alt pronation/supination B intact Finger to nose B intact Heel to shin B intact  MUSCLE TONE: slightly low tone in B LEs  POSTURE: rounded shoulders, forward head, increased thoracic kyphosis, and posterior pelvic tilt  LOWER EXTREMITY ROM:     Active  Right Eval Left Eval  Hip flexion    Hip extension    Hip abduction    Hip adduction    Hip internal rotation    Hip external rotation    Knee flexion    Knee extension    Ankle dorsiflexion 11 10  Ankle plantarflexion    Ankle inversion    Ankle eversion     (Blank rows = not tested)  LOWER EXTREMITY MMT:    MMT (in sitting) Right Eval Left Eval  Hip flexion 4 4  Hip extension    Hip abduction 4 4  Hip adduction 4- 4  Hip internal rotation    Hip external rotation    Knee flexion 4+ 4+  Knee extension 4+ 4  Ankle dorsiflexion 4 4  Ankle plantarflexion 4+ 4+  Ankle inversion    Ankle eversion    (Blank rows = not tested)  TRANSFERS: Sit to stand:  heavy reliance on UEs  Stand to sit:  when using walker, unsafe navigation/use of walker    GAIT: Gait pattern:  forward trunk lean throughout, ambulating with walker close to BOS Assistive device utilized: Environmental consultant - 4 wheeled Level of assistance: Modified independence   FUNCTIONAL TESTs:         PATIENT EDUCATION: Education details: prognosis, POC, HEP; provided information on aquatic therapy and benefits of OT and ST Person educated: Patient Education method: Explanation, Demonstration, Tactile cues,  Verbal cues, and Handouts Education comprehension: verbalized understanding   HOME EXERCISE PROGRAM: Access Code: [email or web address] URL: [email or web address] Date: 02/12/2022 Prepared by: [facility]  Exercises - Supine Bridge with Mini Swiss Ball Between Knees  - 1 x daily - 5 x weekly - 2 sets - 10 reps - Semi-Tandem Balance at Counter Top Eyes OPEN - 1 x daily - 5 x weekly - 2 sets - 30 sec hold - Mini Squat with Counter Support  - 1 x daily - 5 x weekly - 2 sets - 10 reps - Standing Hip Abduction with Counter Support  - 1 x daily - 5 x weekly - 2 sets - 10 reps - Sit to Stand with Counter Support  - 1 x daily - 5 x weekly - 2 sets - 10 reps    GOALS: Goals reviewed with patient? Yes  SHORT TERM GOALS: Target date: 03/05/2022  Patient to be independent with initial HEP. Baseline: HEP  initiated Goal status: IN PROGRESS    LONG TERM GOALS: Target date: 04/09/2022  Patient to be independent with advanced HEP. Baseline: Not yet initiated  Goal status: IN PROGRESS  Patient to demonstrate floor to stand transfer with min-mod A.  Baseline: NT Goal status: IN PROGRESS  Patient to score at least 43/56 on Berg in order to decrease risk of falls.   Baseline: 37/56 Goal status: IN PROGRESS  Patient to demonstrate 5xSTS test in <15 sec with hands on knees in order to decrease risk of falls.   Baseline: 12 sec with B armrests Goal status: IN PROGRESS  Patient to verbalize understanding of fall prevention in home environment information. Baseline: Not yet initiated Goal status: IN PROGRESS     ASSESSMENT:  CLINICAL IMPRESSION: Patient arrived to session with husband without new complaints. Note that they have not reached out to Neurologist d/t changes in vision and dizziness however report plants to make an appointment with ophthalmologist to address this. Worked on core strengthening activities with intermittent cues for proper  muscle isolation. Able to perform STS transfers with cueing for set up and improved tremor with addition of weighted resistance. Performed stepping over low obstacle with more difficulty with L>R side. Patient tolerated session well; did report some fatigue at end of session.   OBJECTIVE IMPAIRMENTS Abnormal gait, decreased balance, decreased strength, decreased safety awareness, dizziness, increased muscle spasms, impaired flexibility, impaired tone, improper body mechanics, postural dysfunction, and pain.   ACTIVITY LIMITATIONS carrying, lifting, bending, standing, squatting, stairs, transfers, bed mobility, bathing, dressing, reach over head, and locomotion level  PARTICIPATION LIMITATIONS: meal prep, cleaning, laundry, shopping, community activity, school, and church  PERSONAL FACTORS Age, Past/current experiences, Time since onset of injury/illness/exacerbation, and 3+ comorbidities: anemia, asthma, depression, GERD, HLD, HTN, MSA, osteoporosis, Parkinsonism, B TKA  are also affecting patient's functional outcome.   REHAB POTENTIAL: Good  CLINICAL DECISION MAKING: Evolving/moderate complexity  EVALUATION COMPLEXITY: Moderate  PLAN: PT FREQUENCY: 2x/week  PT DURATION: 8 weeks  PLANNED INTERVENTIONS: Therapeutic exercises, Therapeutic activity, Neuromuscular re-education, Balance training, Gait training, Patient/Family education, Self Care, Joint mobilization, Stair training, Vestibular training, Canalith repositioning, Aquatic Therapy, Dry Needling, Electrical stimulation, Cryotherapy, Moist heat, Taping, Manual therapy, and Re-evaluation  PLAN FOR NEXT SESSION: progress hip strengthening, static and dynamic balance    [name], PT, DPT 03/05/22 5:10 PM  [facility] at [facility] [address], [place], [place] [address] Phone # [phone] Fax # [phone]

## 2022-03-05 ENCOUNTER — Ambulatory Visit: Payer: Medicare Other | Admitting: Physical Therapy

## 2022-03-05 ENCOUNTER — Encounter: Payer: Self-pay | Admitting: Physical Therapy

## 2022-03-05 DIAGNOSIS — M6281 Muscle weakness (generalized): Secondary | ICD-10-CM

## 2022-03-05 DIAGNOSIS — R42 Dizziness and giddiness: Secondary | ICD-10-CM | POA: Diagnosis not present

## 2022-03-05 DIAGNOSIS — R2681 Unsteadiness on feet: Secondary | ICD-10-CM

## 2022-03-05 DIAGNOSIS — G232 Striatonigral degeneration: Secondary | ICD-10-CM | POA: Diagnosis not present

## 2022-03-05 DIAGNOSIS — M62838 Other muscle spasm: Secondary | ICD-10-CM | POA: Diagnosis not present

## 2022-03-07 ENCOUNTER — Ambulatory Visit: Payer: Medicare Other | Admitting: Physical Therapy

## 2022-03-12 ENCOUNTER — Encounter (HOSPITAL_BASED_OUTPATIENT_CLINIC_OR_DEPARTMENT_OTHER): Payer: Self-pay | Admitting: Physical Therapy

## 2022-03-12 ENCOUNTER — Ambulatory Visit (HOSPITAL_BASED_OUTPATIENT_CLINIC_OR_DEPARTMENT_OTHER): Payer: Medicare Other | Attending: Neurology | Admitting: Physical Therapy

## 2022-03-12 DIAGNOSIS — R42 Dizziness and giddiness: Secondary | ICD-10-CM | POA: Diagnosis not present

## 2022-03-12 DIAGNOSIS — R2681 Unsteadiness on feet: Secondary | ICD-10-CM | POA: Insufficient documentation

## 2022-03-12 DIAGNOSIS — M6281 Muscle weakness (generalized): Secondary | ICD-10-CM | POA: Diagnosis not present

## 2022-03-12 DIAGNOSIS — G232 Striatonigral degeneration: Secondary | ICD-10-CM | POA: Diagnosis not present

## 2022-03-12 NOTE — Therapy (Signed)
OUTPATIENT PHYSICAL THERAPY NEURO TREATMENT   Patient Name: Rachel [name] MRN: [id number] DOB:[birth year], 71 y.o., female Today's Date: 03/12/2022   PCP: Ginger Organ., MD REFERRING PROVIDER: Ludwig Clarks, DO   PT End of Session - 03/12/22 1708     Visit Number 7    Number of Visits 17    Date for PT Re-Evaluation 04/09/22    Authorization Type BCBS Medicare    PT Start Time 1701    PT Stop Time 6333    PT Time Calculation (min) 44 min    Equipment Utilized During Treatment Gait belt    Activity Tolerance Patient tolerated treatment well    Behavior During Therapy WFL for tasks assessed/performed                  Past Medical History:  Diagnosis Date   Allergic rhinitis    Anemia    Arthritis    Asthma    Blood clot in vein  age 57   left leg   Depression    GERD (gastroesophageal reflux disease)    Hyperlipidemia    Hypertension    pt denies, in physician record   Hypothyroidism    Kidney stones    Multiple pregnancy loss, not currently pregnant    multiple miscarriages-pt.unsure of #   Multiple system atrophy ([place]) 2015   Nephrolithiasis    hx of kidney stones, last 6 months ago   Osteopenia    Osteoporosis    Parkinsonism ([place])    Tendinitis    left foot   Vitamin D deficiency    Past Surgical History:  Procedure Laterality Date   BREAST EXCISIONAL BIOPSY Right    BREAST SURGERY     rt. lumpectomy, benign   colonscopy  2011   cystoscopy  2012   JOINT REPLACEMENT  05-2007   right knee replacment/revision   KIDNEY STONE SURGERY     LEFT HEART CATH AND CORONARY ANGIOGRAPHY N/A 02/21/2020   Procedure: LEFT HEART CATH AND CORONARY ANGIOGRAPHY;  Surgeon: Adrian Prows, MD;  Location: [facility];  Service: Cardiovascular;  Laterality: N/A;   REPLACEMENT TOTAL KNEE Left 06/05/12   TOTAL KNEE REVISION  06/03/2012   Procedure: TOTAL KNEE REVISION;  Surgeon: Gearlean Alf, MD;  Location: [facility];  Service: Orthopedics;  Laterality: Left;   Revision of a Left Uni Knee to a Total Knee Arthroplasty   Patient Active Problem List   Diagnosis Date Noted   Prolonged Q-T interval on ECG    Class 1 obesity due to excess calories with serious comorbidity and body mass index (BMI) of 34.0 to 34.9 in adult    Community acquired pneumonia of right lower lobe of lung 02/17/2020   Mild intermittent asthma with (acute) exacerbation 02/17/2020   Acute respiratory failure with hypoxia ([place]) 02/17/2020   Acute combined systolic and diastolic heart failure (HCC)    Cardiomyopathy (HCC)    Type 2 myocardial infarction ([place])    Elevated troponin    Sinus tachycardia    LBBB (left bundle branch block)    Shortness of breath    Hypertensive heart and kidney disease with HF and with CKD stage I-IV ([place])    Mixed hyperlipidemia    Hx of deep venous thrombosis    Multiple system atrophy, 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[place]) 11/19/2013   Thrush 06/15/2012   S/P total knee arthroplasty 06/15/2012   Fall 06/13/2012   Urinary incontinence 06/13/2012   Postop Acute blood loss anemia  06/04/2012   Postop Hypokalemia 06/04/2012   Pain due to unicompartmental arthroplasty of knee ([place]) 06/03/2012   GERD 03/18/2008   GI BLEED 03/18/2008   DYSPHAGIA 03/18/2008   FECAL OCCULT BLOOD 03/18/2008    ONSET DATE: past several years  REFERRING DIAG: G23.2 (ICD-10-CM) - Multiple system atrophy, 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HCC)  THERAPY DIAG:  Unsteadiness on feet  Muscle weakness (generalized)  Multiple system atrophy, 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HCC)  Dizziness and giddiness  Rationale for Evaluation and Treatment Rehabilitation  SUBJECTIVE:    "Current:  I am exciting to begin aquatic therapy . I am not hurting anywhere. I am comfortable in the water"                                                                                                                                                                                         SUBJECTIVE STATEMENT: HEP is going well.  Has not reached out to Dr. Carles Collet about her blurred vision but plans to make an appointment with eye Dr.  Pt accompanied by: self, husband  PERTINENT HISTORY: anemia, asthma, depression, GERD, HLD, HTN, MSA, osteoporosis, Parkinsonism, B TKA  PAIN:  Are you having pain? Yes- c/o mild LBP  PAIN:  Are you having pain? Yes: NPRS scale: mild/10 Pain location: LB Aggravating factors: standing, walking    PRECAUTIONS: Fall  PATIENT GOALS improve strength  OBJECTIVE:     TODAY'S TREATMENT: 03/05/22 Activity Comments  Standing red TB row 2x10 Cues for core contraction  Standing red TB extension 2x10 Cues for core contraction and avoiding valsalva  R/L paloff press with red TB 10x each side   good carryover after demonstration  STS 5x, 5x with green medball at chest Cues to scoot forward and tuck feet back  fwd/back stepping over 1/2 foam roll 10x each with 1 UE support on counter  Limited L knee flexion during backwards step   romberg EO/EC 30" Mild sway  Romberg EC + head turn/nods 30" each Moderate sway     PATIENT EDUCATION: Education details: reviewed techniques to address orthostasis, encouraged appointment with ophthalmologist to address diplopia   Person educated: Patient and Spouse Education method: Explanation and Demonstration Education comprehension: verbalized understanding and returned demonstration   Access Code: [email or web address] URL: [email or web address] Date: 02/22/2022-most recent updates Prepared by: [facility]  Exercises - Supine Bridge with Mini Swiss Ball Between Knees  - 1 x daily - 5 x weekly - 2 sets - 10 reps - Semi-Tandem Balance at Counter Top Eyes Closed  - 1 x daily - 5 x weekly - 2 sets - 30 sec hold -  Mini Squat with Counter Support  - 1 x daily - 5 x weekly - 2 sets - 10 reps - Standing Hip Abduction with Counter Support  - 1 x daily - 5 x weekly - 2 sets - 10 reps - Sit to Stand with Counter  Support  - 1 x daily - 5 x weekly - 2 sets - 10 reps - Side to side weightshift  - 1 x daily - 5 x weekly - 1-2 sets - 10 reps - Staggered Stance Forward Backward Weight Shift with Counter Support  - 1 x daily - 5 x weekly - 1-2 sets - 10 reps   Below measures were taken at time of initial evaluation unless otherwise specified:   DIAGNOSTIC FINDINGS: none recent  COGNITION: Overall cognitive status: No family/caregiver present to determine baseline cognitive functioning  OBSERVATION: chorea throughout session; tendency for R lateral lean   SENSATION: WFL  COORDINATION: Alt pronation/supination B intact Finger to nose B intact Heel to shin B intact  MUSCLE TONE: slightly low tone in B LEs  POSTURE: rounded shoulders, forward head, increased thoracic kyphosis, and posterior pelvic tilt  LOWER EXTREMITY ROM:     Active  Right Eval Left Eval  Hip flexion    Hip extension    Hip abduction    Hip adduction    Hip internal rotation    Hip external rotation    Knee flexion    Knee extension    Ankle dorsiflexion 11 10  Ankle plantarflexion    Ankle inversion    Ankle eversion     (Blank rows = not tested)  LOWER EXTREMITY MMT:    MMT (in sitting) Right Eval Left Eval  Hip flexion 4 4  Hip extension    Hip abduction 4 4  Hip adduction 4- 4  Hip internal rotation    Hip external rotation    Knee flexion 4+ 4+  Knee extension 4+ 4  Ankle dorsiflexion 4 4  Ankle plantarflexion 4+ 4+  Ankle inversion    Ankle eversion    (Blank rows = not tested)  TRANSFERS: Sit to stand:  heavy reliance on UEs  Stand to sit:  when using walker, unsafe navigation/use of walker    GAIT: Gait pattern:  forward trunk lean throughout, ambulating with walker close to BOS Assistive device utilized: Walker - 4 wheeled Level of assistance: Modified independence   FUNCTIONAL TESTs:     TREATMENT 03/12/22 Pt seen for aquatic therapy today.  Treatment took place in water  3.25-4.5 ft in depth at the [facility]. Temp of water was 91.  Pt entered/exited the pool via steps with handrails rail.  Intro to setting Walking ue supported on yellow noodle 3 ft forward, back and side stepping Standing ue supported on wall: df; pf;dd/abd; hip extension; marching x 10-12 Seated on 4th step (bottom) flutter kicking; add/abd 3 x 20 reps Standing  balance ue supported with yellow hand buoys: feet together holding x 30 secs; tandem with some unsteadiness but holds x20s leading R/L. Unilateral stance unable to hold > 3 s L>R. Side stepping with 1 foam hand buoy shoulder add/abd x 4 widths. VC and demonstration for proper execution.  Pt requires the buoyancy and hydrostatic pressure of water for support, and to offload joints by unweighting joint load by at least 50 % in navel deep water and by at least 75-80% in chest to neck deep water.  Viscosity of the water is needed for resistance of  strengthening. Water current perturbations provides challenge to standing balance requiring increased core activation.     PATIENT EDUCATION: Education details: prognosis, POC, HEP; provided information on aquatic therapy and benefits of OT and ST Person educated: Patient Education method: Explanation, Demonstration, Tactile cues, Verbal cues, and Handouts Education comprehension: verbalized understanding   HOME EXERCISE PROGRAM: Access Code: [email or web address] URL: [email or web address] Date: 02/12/2022 Prepared by: [facility]  Exercises - Supine Bridge with Mini Swiss Ball Between Knees  - 1 x daily - 5 x weekly - 2 sets - 10 reps - Semi-Tandem Balance at Counter Top Eyes OPEN - 1 x daily - 5 x weekly - 2 sets - 30 sec hold - Mini Squat with Counter Support  - 1 x daily - 5 x weekly - 2 sets - 10 reps - Standing Hip Abduction with Counter Support  - 1 x daily - 5 x weekly - 2 sets - 10 reps - Sit to Stand with Counter Support   - 1 x daily - 5 x weekly - 2 sets - 10 reps    GOALS: Goals reviewed with patient? Yes  SHORT TERM GOALS: Target date: 03/05/2022  Patient to be independent with initial HEP. Baseline: HEP initiated Goal status: IN PROGRESS    LONG TERM GOALS: Target date: 04/09/2022  Patient to be independent with advanced HEP. Baseline: Not yet initiated  Goal status: IN PROGRESS  Patient to demonstrate floor to stand transfer with min-mod A.  Baseline: NT Goal status: IN PROGRESS  Patient to score at least 43/56 on Berg in order to decrease risk of falls.   Baseline: 37/56 Goal status: IN PROGRESS  Patient to demonstrate 5xSTS test in <15 sec with hands on knees in order to decrease risk of falls.   Baseline: 12 sec with B armrests Goal status: IN PROGRESS  Patient to verbalize understanding of fall prevention in home environment information. Baseline: Not yet initiated Goal status: IN PROGRESS     ASSESSMENT:  CLINICAL IMPRESSION: Patient arrived to session with husband.  She demonstrates safety and indep in 3 - 3.68ft with and without ue support.  Worked on balance and core strength in 3 ft. Pt able to maintain tandem positioning in setting with ue support. Unsteadiness engages core musculature. Pt edu on abdominal bracing and upright posture. She does tremor throughout session. May consider weighting to decrease. Pt reports knowledge of how to swim; past experience in pool therapy. End of session pt tries to swim on own accord and is unable to regain standing position requiring therapist intervention in pool.  She is  a good candidate for aqautic therapy using properties of water to facilitate and hasten progression towards goals, improving functional mobility and decreasing fall risk.    OBJECTIVE IMPAIRMENTS Abnormal gait, decreased balance, decreased strength, decreased safety awareness, dizziness, increased muscle spasms, impaired flexibility, impaired tone, improper body  mechanics, postural dysfunction, and pain.   ACTIVITY LIMITATIONS carrying, lifting, bending, standing, squatting, stairs, transfers, bed mobility, bathing, dressing, reach over head, and locomotion level  PARTICIPATION LIMITATIONS: meal prep, cleaning, laundry, shopping, community activity, school, and church  PERSONAL FACTORS Age, Past/current experiences, Time since onset of injury/illness/exacerbation, and 3+ comorbidities: anemia, asthma, depression, GERD, HLD, HTN, MSA, osteoporosis, Parkinsonism, B TKA  are also affecting patient's functional outcome.   REHAB POTENTIAL: Good  CLINICAL DECISION MAKING: Evolving/moderate complexity  EVALUATION COMPLEXITY: Moderate  PLAN: PT FREQUENCY: 2x/week  PT DURATION: 8 weeks  PLANNED INTERVENTIONS: Therapeutic  exercises, Therapeutic activity, Neuromuscular re-education, Balance training, Gait training, Patient/Family education, Self Care, Joint mobilization, Stair training, Vestibular training, Canalith repositioning, Aquatic Therapy, Dry Needling, Electrical stimulation, Cryotherapy, Moist heat, Taping, Manual therapy, and Re-evaluation  PLAN FOR NEXT SESSION: progress hip strengthening, static and dynamic balance    Stanton Kidney (Frankie) Lynett Brasil MPT 03/12/22 5:11 PM

## 2022-03-14 ENCOUNTER — Ambulatory Visit: Payer: Medicare Other

## 2022-03-14 DIAGNOSIS — G232 Striatonigral degeneration: Secondary | ICD-10-CM | POA: Diagnosis not present

## 2022-03-14 DIAGNOSIS — R2681 Unsteadiness on feet: Secondary | ICD-10-CM

## 2022-03-14 DIAGNOSIS — M6281 Muscle weakness (generalized): Secondary | ICD-10-CM

## 2022-03-14 DIAGNOSIS — R42 Dizziness and giddiness: Secondary | ICD-10-CM | POA: Diagnosis not present

## 2022-03-14 DIAGNOSIS — M62838 Other muscle spasm: Secondary | ICD-10-CM | POA: Diagnosis not present

## 2022-03-14 NOTE — Therapy (Signed)
OUTPATIENT PHYSICAL THERAPY NEURO TREATMENT   Patient Name: Rachel [name] MRN: [id number] DOB:1951-06-28, 71 y.o., female Today's Date: 03/14/2022   PCP: Ginger Organ., MD REFERRING PROVIDER: Ludwig Clarks, DO   PT End of Session - 03/14/22 1450     Visit Number 8    Number of Visits 17    Date for PT Re-Evaluation 04/09/22    Authorization Type BCBS Medicare    PT Start Time 1450    PT Stop Time 5643    PT Time Calculation (min) 40 min    Equipment Utilized During Treatment Gait belt    Activity Tolerance Patient tolerated treatment well    Behavior During Therapy WFL for tasks assessed/performed                  Past Medical History:  Diagnosis Date   Allergic rhinitis    Anemia    Arthritis    Asthma    Blood clot in vein  age 76   left leg   Depression    GERD (gastroesophageal reflux disease)    Hyperlipidemia    Hypertension    pt denies, in physician record   Hypothyroidism    Kidney stones    Multiple pregnancy loss, not currently pregnant    multiple miscarriages-pt.unsure of #   Multiple system atrophy ([place]) 2015   Nephrolithiasis    hx of kidney stones, last 6 months ago   Osteopenia    Osteoporosis    Parkinsonism ([place])    Tendinitis    left foot   Vitamin D deficiency    Past Surgical History:  Procedure Laterality Date   BREAST EXCISIONAL BIOPSY Right    BREAST SURGERY     rt. lumpectomy, benign   colonscopy  2011   cystoscopy  2012   JOINT REPLACEMENT  05-2007   right knee replacment/revision   KIDNEY STONE SURGERY     LEFT HEART CATH AND CORONARY ANGIOGRAPHY N/A 02/21/2020   Procedure: LEFT HEART CATH AND CORONARY ANGIOGRAPHY;  Surgeon: Adrian Prows, MD;  Location: [facility];  Service: Cardiovascular;  Laterality: N/A;   REPLACEMENT TOTAL KNEE Left 06/05/12   TOTAL KNEE REVISION  06/03/2012   Procedure: TOTAL KNEE REVISION;  Surgeon: Gearlean Alf, MD;  Location: [facility];  Service: Orthopedics;  Laterality: Left;   Revision of a Left Uni Knee to a Total Knee Arthroplasty   Patient Active Problem List   Diagnosis Date Noted   Prolonged Q-T interval on ECG    Class 1 obesity due to excess calories with serious comorbidity and body mass index (BMI) of 34.0 to 34.9 in adult    Community acquired pneumonia of right lower lobe of lung 02/17/2020   Mild intermittent asthma with (acute) exacerbation 02/17/2020   Acute respiratory failure with hypoxia ([place]) 02/17/2020   Acute combined systolic and diastolic heart failure (HCC)    Cardiomyopathy (HCC)    Type 2 myocardial infarction ([place])    Elevated troponin    Sinus tachycardia    LBBB (left bundle branch block)    Shortness of breath    Hypertensive heart and kidney disease with HF and with CKD stage I-IV ([place])    Mixed hyperlipidemia    Hx of deep venous thrombosis    Multiple system atrophy, 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[place]) 11/19/2013   Thrush 06/15/2012   S/P total knee arthroplasty 06/15/2012   Fall 06/13/2012   Urinary incontinence 06/13/2012   Postop Acute blood loss anemia  06/04/2012   Postop Hypokalemia 06/04/2012   Pain due to unicompartmental arthroplasty of knee ([place]) 06/03/2012   GERD 03/18/2008   GI BLEED 03/18/2008   DYSPHAGIA 03/18/2008   FECAL OCCULT BLOOD 03/18/2008    ONSET DATE: past several years  REFERRING DIAG: G23.2 (ICD-10-CM) - Multiple system atrophy, 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HCC)  THERAPY DIAG:  Unsteadiness on feet  Muscle weakness (generalized)  Multiple system atrophy, 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HCC)  Rationale for Evaluation and Treatment Rehabilitation  SUBJECTIVE:    Aquatic therapy went well, worked a lot of lower body movements. Left shoulder is very sore today, this has been an on-going issue. Pt accompanied by: self, husband  PERTINENT HISTORY: anemia, asthma, depression, GERD, HLD, HTN, MSA, osteoporosis, Parkinsonism, B TKA  PAIN:  Are you having pain? Yes- c/o mild LBP  PAIN:  Are you having pain? Yes: NPRS  scale: mild/10 Pain location: LB Aggravating factors: standing, walking    PRECAUTIONS: Fall  PATIENT GOALS improve strength  OBJECTIVE:     TODAY'S TREATMENT: 03/14/22 Activity Comments  Standing red TB row 2x8 Cues for core contraction and isolated movement RUE only  Standing red TB extension 2x10 Cues for core contraction and avoiding valsalva. RUE only   R/L paloff press with red TB 10x each side   good carryover after demonstration  STS 5x, 5x with blue medball at chest Cues to scoot forward and tuck feet back  Fwd/bkward and lateral stepping on airex pad 1x5  Limited L knee flexion during backwards step   romberg EO/EC 30" Mild sway  Romberg EC + head turn/nods 30" each Moderate sway     PATIENT EDUCATION: Education details: reviewed techniques to address orthostasis, encouraged appointment with ophthalmologist to address diplopia   Person educated: Patient and Spouse Education method: Explanation and Demonstration Education comprehension: verbalized understanding and returned demonstration   Access Code: [email or web address] URL: [email or web address] Date: 02/22/2022-most recent updates Prepared by: [facility]  Exercises - Supine Bridge with Mini Swiss Ball Between Knees  - 1 x daily - 5 x weekly - 2 sets - 10 reps - Semi-Tandem Balance at Counter Top Eyes Closed  - 1 x daily - 5 x weekly - 2 sets - 30 sec hold - Mini Squat with Counter Support  - 1 x daily - 5 x weekly - 2 sets - 10 reps - Standing Hip Abduction with Counter Support  - 1 x daily - 5 x weekly - 2 sets - 10 reps - Sit to Stand with Counter Support  - 1 x daily - 5 x weekly - 2 sets - 10 reps - Side to side weightshift  - 1 x daily - 5 x weekly - 1-2 sets - 10 reps - Staggered Stance Forward Backward Weight Shift with Counter Support  - 1 x daily - 5 x weekly - 1-2 sets - 10 reps   Below measures were taken at time of initial evaluation unless otherwise  specified:   DIAGNOSTIC FINDINGS: none recent  COGNITION: Overall cognitive status: No family/caregiver present to determine baseline cognitive functioning  OBSERVATION: chorea throughout session; tendency for R lateral lean   SENSATION: WFL  COORDINATION: Alt pronation/supination B intact Finger to nose B intact Heel to shin B intact  MUSCLE TONE: slightly low tone in B LEs  POSTURE: rounded shoulders, forward head, increased thoracic kyphosis, and posterior pelvic tilt  LOWER EXTREMITY ROM:     Active  Right Eval Left Eval  Hip  flexion    Hip extension    Hip abduction    Hip adduction    Hip internal rotation    Hip external rotation    Knee flexion    Knee extension    Ankle dorsiflexion 11 10  Ankle plantarflexion    Ankle inversion    Ankle eversion     (Blank rows = not tested)  LOWER EXTREMITY MMT:    MMT (in sitting) Right Eval Left Eval  Hip flexion 4 4  Hip extension    Hip abduction 4 4  Hip adduction 4- 4  Hip internal rotation    Hip external rotation    Knee flexion 4+ 4+  Knee extension 4+ 4  Ankle dorsiflexion 4 4  Ankle plantarflexion 4+ 4+  Ankle inversion    Ankle eversion    (Blank rows = not tested)  TRANSFERS: Sit to stand:  heavy reliance on UEs  Stand to sit:  when using walker, unsafe navigation/use of walker    GAIT: Gait pattern:  forward trunk lean throughout, ambulating with walker close to BOS Assistive device utilized: Walker - 4 wheeled Level of assistance: Modified independence   FUNCTIONAL TESTs:     TREATMENT 03/12/22 Pt seen for aquatic therapy today.  Treatment took place in water 3.25-4.5 ft in depth at the [facility]. Temp of water was 91.  Pt entered/exited the pool via steps with handrails rail.  Intro to setting Walking ue supported on yellow noodle 3 ft forward, back and side stepping Standing ue supported on wall: df; pf;dd/abd; hip extension; marching x 10-12 Seated on 4th  step (bottom) flutter kicking; add/abd 3 x 20 reps Standing  balance ue supported with yellow hand buoys: feet together holding x 30 secs; tandem with some unsteadiness but holds x20s leading R/L. Unilateral stance unable to hold > 3 s L>R. Side stepping with 1 foam hand buoy shoulder add/abd x 4 widths. VC and demonstration for proper execution.  Pt requires the buoyancy and hydrostatic pressure of water for support, and to offload joints by unweighting joint load by at least 50 % in navel deep water and by at least 75-80% in chest to neck deep water.  Viscosity of the water is needed for resistance of strengthening. Water current perturbations provides challenge to standing balance requiring increased core activation.     PATIENT EDUCATION: Education details: prognosis, POC, HEP; provided information on aquatic therapy and benefits of OT and ST Person educated: Patient Education method: Explanation, Demonstration, Tactile cues, Verbal cues, and Handouts Education comprehension: verbalized understanding   HOME EXERCISE PROGRAM: Access Code: [email or web address] URL: [email or web address] Date: 02/12/2022 Prepared by: [facility]  Exercises - Supine Bridge with Mini Swiss Ball Between Knees  - 1 x daily - 5 x weekly - 2 sets - 10 reps - Semi-Tandem Balance at Counter Top Eyes OPEN - 1 x daily - 5 x weekly - 2 sets - 30 sec hold - Mini Squat with Counter Support  - 1 x daily - 5 x weekly - 2 sets - 10 reps - Standing Hip Abduction with Counter Support  - 1 x daily - 5 x weekly - 2 sets - 10 reps - Sit to Stand with Counter Support  - 1 x daily - 5 x weekly - 2 sets - 10 reps    GOALS: Goals reviewed with patient? Yes  SHORT TERM GOALS: Target date: 03/05/2022  Patient to be independent with  initial HEP. Baseline: HEP initiated Goal status: IN PROGRESS    LONG TERM GOALS: Target date: 04/09/2022  Patient to be independent with advanced  HEP. Baseline: Not yet initiated  Goal status: IN PROGRESS  Patient to demonstrate floor to stand transfer with min-mod A.  Baseline: NT Goal status: IN PROGRESS  Patient to score at least 43/56 on Berg in order to decrease risk of falls.   Baseline: 37/56 Goal status: IN PROGRESS  Patient to demonstrate 5xSTS test in <15 sec with hands on knees in order to decrease risk of falls.   Baseline: 12 sec with B armrests Goal status: IN PROGRESS  Patient to verbalize understanding of fall prevention in home environment information. Baseline: Not yet initiated Goal status: IN PROGRESS     ASSESSMENT:  CLINICAL IMPRESSION: Cues for sequence and proper form for UE resistance exercise using T-band, limited use of left shoulder due to acute exacerbation of chronic pain. Progressing with dynamic standing balance and coordination activity using compliant surface and activity for rapid change of direction with good stability demonstrated only needing light UE support on counter top to correct LOB. Continued sessions to advance functional strengthening and more dynamic balance activities to reduce risk for falls    OBJECTIVE IMPAIRMENTS Abnormal gait, decreased balance, decreased strength, decreased safety awareness, dizziness, increased muscle spasms, impaired flexibility, impaired tone, improper body mechanics, postural dysfunction, and pain.   ACTIVITY LIMITATIONS carrying, lifting, bending, standing, squatting, stairs, transfers, bed mobility, bathing, dressing, reach over head, and locomotion level  PARTICIPATION LIMITATIONS: meal prep, cleaning, laundry, shopping, community activity, school, and church  PERSONAL FACTORS Age, Past/current experiences, Time since onset of injury/illness/exacerbation, and 3+ comorbidities: anemia, asthma, depression, GERD, HLD, HTN, MSA, osteoporosis, Parkinsonism, B TKA  are also affecting patient's functional outcome.   REHAB POTENTIAL: Good  CLINICAL  DECISION MAKING: Evolving/moderate complexity  EVALUATION COMPLEXITY: Moderate  PLAN: PT FREQUENCY: 2x/week  PT DURATION: 8 weeks  PLANNED INTERVENTIONS: Therapeutic exercises, Therapeutic activity, Neuromuscular re-education, Balance training, Gait training, Patient/Family education, Self Care, Joint mobilization, Stair training, Vestibular training, Canalith repositioning, Aquatic Therapy, Dry Needling, Electrical stimulation, Cryotherapy, Moist heat, Taping, Manual therapy, and Re-evaluation  PLAN FOR NEXT SESSION: progress hip strengthening, static and dynamic balance    Stanton Kidney (Frankie) Ziemba MPT 03/14/22 2:51 PM

## 2022-03-19 ENCOUNTER — Encounter (HOSPITAL_BASED_OUTPATIENT_CLINIC_OR_DEPARTMENT_OTHER): Payer: Self-pay | Admitting: Physical Therapy

## 2022-03-19 ENCOUNTER — Ambulatory Visit (HOSPITAL_BASED_OUTPATIENT_CLINIC_OR_DEPARTMENT_OTHER): Payer: Medicare Other | Admitting: Physical Therapy

## 2022-03-19 DIAGNOSIS — R2681 Unsteadiness on feet: Secondary | ICD-10-CM

## 2022-03-19 DIAGNOSIS — G232 Striatonigral degeneration: Secondary | ICD-10-CM

## 2022-03-19 DIAGNOSIS — M6281 Muscle weakness (generalized): Secondary | ICD-10-CM

## 2022-03-19 DIAGNOSIS — R42 Dizziness and giddiness: Secondary | ICD-10-CM | POA: Diagnosis not present

## 2022-03-19 NOTE — Therapy (Signed)
OUTPATIENT PHYSICAL THERAPY NEURO TREATMENT   Patient Name: Rachel [name] MRN: [id number] DOB:1950/11/14, 71 y.o., female Today's Date: 03/19/2022   PCP: Ginger Organ., MD REFERRING PROVIDER: Carles Collet Eustace Quail, DO          Past Medical History:  Diagnosis Date   Allergic rhinitis    Anemia    Arthritis    Asthma    Blood clot in vein  age 6   left leg   Depression    GERD (gastroesophageal reflux disease)    Hyperlipidemia    Hypertension    pt denies, in physician record   Hypothyroidism    Kidney stones    Multiple pregnancy loss, not currently pregnant    multiple miscarriages-pt.unsure of #   Multiple system atrophy ([place]) 2015   Nephrolithiasis    hx of kidney stones, last 6 months ago   Osteopenia    Osteoporosis    Parkinsonism ([place])    Tendinitis    left foot   Vitamin D deficiency    Past Surgical History:  Procedure Laterality Date   BREAST EXCISIONAL BIOPSY Right    BREAST SURGERY     rt. lumpectomy, benign   colonscopy  2011   cystoscopy  2012   JOINT REPLACEMENT  05-2007   right knee replacment/revision   KIDNEY STONE SURGERY     LEFT HEART CATH AND CORONARY ANGIOGRAPHY N/A 02/21/2020   Procedure: LEFT HEART CATH AND CORONARY ANGIOGRAPHY;  Surgeon: Adrian Prows, MD;  Location: [facility];  Service: Cardiovascular;  Laterality: N/A;   REPLACEMENT TOTAL KNEE Left 06/05/12   TOTAL KNEE REVISION  06/03/2012   Procedure: TOTAL KNEE REVISION;  Surgeon: Gearlean Alf, MD;  Location: [facility];  Service: Orthopedics;  Laterality: Left;  Revision of a Left Uni Knee to a Total Knee Arthroplasty   Patient Active Problem List   Diagnosis Date Noted   Prolonged Q-T interval on ECG    Class 1 obesity due to excess calories with serious comorbidity and body mass index (BMI) of 34.0 to 34.9 in adult    Community acquired pneumonia of right lower lobe of lung 02/17/2020   Mild intermittent asthma with (acute) exacerbation 02/17/2020   Acute  respiratory failure with hypoxia ([place]) 02/17/2020   Acute combined systolic and diastolic heart failure (HCC)    Cardiomyopathy (HCC)    Type 2 myocardial infarction (HCC)    Elevated troponin    Sinus tachycardia    LBBB (left bundle branch block)    Shortness of breath    Hypertensive heart and kidney disease with HF and with CKD stage I-IV ([place])    Mixed hyperlipidemia    Hx of deep venous thrombosis    Multiple system atrophy, 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[place]) 11/19/2013   Thrush 06/15/2012   S/P total knee arthroplasty 06/15/2012   Fall 06/13/2012   Urinary incontinence 06/13/2012   Postop Acute blood loss anemia 06/04/2012   Postop Hypokalemia 06/04/2012   Pain due to unicompartmental arthroplasty of knee ([place]) 06/03/2012   GERD 03/18/2008   GI BLEED 03/18/2008   DYSPHAGIA 03/18/2008   FECAL OCCULT BLOOD 03/18/2008    ONSET DATE: past several years  REFERRING DIAG: G23.2 (ICD-10-CM) - Multiple system atrophy, 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[place])  THERAPY DIAG:  No diagnosis found.  Rationale for Evaluation and Treatment Rehabilitation  SUBJECTIVE:    Aquatic therapy went well, worked a lot of lower body movements. Left shoulder is very sore today, this has been an on-going  issue. Pt accompanied by: self, husband  PERTINENT HISTORY: anemia, asthma, depression, GERD, HLD, HTN, MSA, osteoporosis, Parkinsonism, B TKA  PAIN:  Are you having pain? Yes- c/o mild LBP  PAIN:  Are you having pain? Yes: NPRS scale: mild/10 Pain location: LB Aggravating factors: standing, walking    PRECAUTIONS: Fall  PATIENT GOALS improve strength  OBJECTIVE:     TODAY'S TREATMENT: 03/21/22 Activity Comments                        TODAY'S TREATMENT: 03/14/22 Activity Comments  Standing red TB row 2x8 Cues for core contraction and isolated movement RUE only  Standing red TB extension 2x10 Cues for core contraction and avoiding valsalva. RUE only   R/L paloff press with red TB 10x each  side   good carryover after demonstration  STS 5x, 5x with blue medball at chest Cues to scoot forward and tuck feet back  Fwd/bkward and lateral stepping on airex pad 1x5  Limited L knee flexion during backwards step   romberg EO/EC 30" Mild sway  Romberg EC + head turn/nods 30" each Moderate sway     PATIENT EDUCATION: Education details: reviewed techniques to address orthostasis, encouraged appointment with ophthalmologist to address diplopia   Person educated: Patient and Spouse Education method: Explanation and Demonstration Education comprehension: verbalized understanding and returned demonstration   Access Code: [email or web address] URL: [email or web address] Date: 02/22/2022-most recent updates Prepared by: [facility]  Exercises - Supine Bridge with Mini Swiss Ball Between Knees  - 1 x daily - 5 x weekly - 2 sets - 10 reps - Semi-Tandem Balance at Counter Top Eyes Closed  - 1 x daily - 5 x weekly - 2 sets - 30 sec hold - Mini Squat with Counter Support  - 1 x daily - 5 x weekly - 2 sets - 10 reps - Standing Hip Abduction with Counter Support  - 1 x daily - 5 x weekly - 2 sets - 10 reps - Sit to Stand with Counter Support  - 1 x daily - 5 x weekly - 2 sets - 10 reps - Side to side weightshift  - 1 x daily - 5 x weekly - 1-2 sets - 10 reps - Staggered Stance Forward Backward Weight Shift with Counter Support  - 1 x daily - 5 x weekly - 1-2 sets - 10 reps   Below measures were taken at time of initial evaluation unless otherwise specified:   DIAGNOSTIC FINDINGS: none recent  COGNITION: Overall cognitive status: No family/caregiver present to determine baseline cognitive functioning  OBSERVATION: chorea throughout session; tendency for R lateral lean   SENSATION: WFL  COORDINATION: Alt pronation/supination B intact Finger to nose B intact Heel to shin B intact  MUSCLE TONE: slightly low tone in B LEs  POSTURE: rounded  shoulders, forward head, increased thoracic kyphosis, and posterior pelvic tilt  LOWER EXTREMITY ROM:     Active  Right Eval Left Eval  Hip flexion    Hip extension    Hip abduction    Hip adduction    Hip internal rotation    Hip external rotation    Knee flexion    Knee extension    Ankle dorsiflexion 11 10  Ankle plantarflexion    Ankle inversion    Ankle eversion     (Blank rows = not tested)  LOWER EXTREMITY MMT:    MMT (in sitting) Right Eval Left  Eval  Hip flexion 4 4  Hip extension    Hip abduction 4 4  Hip adduction 4- 4  Hip internal rotation    Hip external rotation    Knee flexion 4+ 4+  Knee extension 4+ 4  Ankle dorsiflexion 4 4  Ankle plantarflexion 4+ 4+  Ankle inversion    Ankle eversion    (Blank rows = not tested)  TRANSFERS: Sit to stand:  heavy reliance on UEs  Stand to sit:  when using walker, unsafe navigation/use of walker    GAIT: Gait pattern:  forward trunk lean throughout, ambulating with walker close to BOS Assistive device utilized: Walker - 4 wheeled Level of assistance: Modified independence   FUNCTIONAL TESTs:     TREATMENT 03/12/22 Pt seen for aquatic therapy today.  Treatment took place in water 3.25-4.5 ft in depth at the [facility]. Temp of water was 91.  Pt entered/exited the pool via steps with handrails rail.  Intro to setting Walking ue supported on yellow noodle 3 ft forward, back and side stepping Standing ue supported on wall: df; pf;dd/abd; hip extension; marching x 10-12 Seated on 4th step (bottom) flutter kicking; add/abd 3 x 20 reps Standing  balance ue supported with yellow hand buoys: feet together holding x 30 secs; tandem with some unsteadiness but holds x20s leading R/L. Unilateral stance unable to hold > 3 s L>R. Side stepping with 1 foam hand buoy shoulder add/abd x 4 widths. VC and demonstration for proper execution.  Pt requires the buoyancy and hydrostatic pressure of water for  support, and to offload joints by unweighting joint load by at least 50 % in navel deep water and by at least 75-80% in chest to neck deep water.  Viscosity of the water is needed for resistance of strengthening. Water current perturbations provides challenge to standing balance requiring increased core activation.     PATIENT EDUCATION: Education details: prognosis, POC, HEP; provided information on aquatic therapy and benefits of OT and ST Person educated: Patient Education method: Explanation, Demonstration, Tactile cues, Verbal cues, and Handouts Education comprehension: verbalized understanding   HOME EXERCISE PROGRAM: Access Code: [email or web address] URL: [email or web address] Date: 02/12/2022 Prepared by: [facility]  Exercises - Supine Bridge with Mini Swiss Ball Between Knees  - 1 x daily - 5 x weekly - 2 sets - 10 reps - Semi-Tandem Balance at Counter Top Eyes OPEN - 1 x daily - 5 x weekly - 2 sets - 30 sec hold - Mini Squat with Counter Support  - 1 x daily - 5 x weekly - 2 sets - 10 reps - Standing Hip Abduction with Counter Support  - 1 x daily - 5 x weekly - 2 sets - 10 reps - Sit to Stand with Counter Support  - 1 x daily - 5 x weekly - 2 sets - 10 reps    GOALS: Goals reviewed with patient? Yes  SHORT TERM GOALS: Target date: 03/05/2022  Patient to be independent with initial HEP. Baseline: HEP initiated Goal status: IN PROGRESS    LONG TERM GOALS: Target date: 04/09/2022  Patient to be independent with advanced HEP. Baseline: Not yet initiated  Goal status: IN PROGRESS  Patient to demonstrate floor to stand transfer with min-mod A.  Baseline: NT Goal status: IN PROGRESS  Patient to score at least 43/56 on Berg in order to decrease risk of falls.   Baseline: 37/56 Goal status: IN PROGRESS  Patient to  demonstrate 5xSTS test in <15 sec with hands on knees in order to decrease risk of falls.   Baseline: 12 sec with  B armrests Goal status: IN PROGRESS  Patient to verbalize understanding of fall prevention in home environment information. Baseline: Not yet initiated Goal status: IN PROGRESS     ASSESSMENT:  CLINICAL IMPRESSION: Cues for sequence and proper form for UE resistance exercise using T-band, limited use of left shoulder due to acute exacerbation of chronic pain. Progressing with dynamic standing balance and coordination activity using compliant surface and activity for rapid change of direction with good stability demonstrated only needing light UE support on counter top to correct LOB. Continued sessions to advance functional strengthening and more dynamic balance activities to reduce risk for falls    OBJECTIVE IMPAIRMENTS Abnormal gait, decreased balance, decreased strength, decreased safety awareness, dizziness, increased muscle spasms, impaired flexibility, impaired tone, improper body mechanics, postural dysfunction, and pain.   ACTIVITY LIMITATIONS carrying, lifting, bending, standing, squatting, stairs, transfers, bed mobility, bathing, dressing, reach over head, and locomotion level  PARTICIPATION LIMITATIONS: meal prep, cleaning, laundry, shopping, community activity, school, and church  PERSONAL FACTORS Age, Past/current experiences, Time since onset of injury/illness/exacerbation, and 3+ comorbidities: anemia, asthma, depression, GERD, HLD, HTN, MSA, osteoporosis, Parkinsonism, B TKA  are also affecting patient's functional outcome.   REHAB POTENTIAL: Good  CLINICAL DECISION MAKING: Evolving/moderate complexity  EVALUATION COMPLEXITY: Moderate  PLAN: PT FREQUENCY: 2x/week  PT DURATION: 8 weeks  PLANNED INTERVENTIONS: Therapeutic exercises, Therapeutic activity, Neuromuscular re-education, Balance training, Gait training, Patient/Family education, Self Care, Joint mobilization, Stair training, Vestibular training, Canalith repositioning, Aquatic Therapy, Dry Needling,  Electrical stimulation, Cryotherapy, Moist heat, Taping, Manual therapy, and Re-evaluation  PLAN FOR NEXT SESSION: progress hip strengthening, static and dynamic balance   [name], PT, DPT 03/19/22 2:28 PM  [facility] at [facility] [address] [place], [place] [address] Phone # [phone] Fax # [phone]

## 2022-03-19 NOTE — Therapy (Signed)
OUTPATIENT PHYSICAL THERAPY NEURO TREATMENT   Patient Name: Rachel [name] MRN: [id number] DOB:20-Oct-1950, 71 y.o., female Today's Date: 03/19/2022   PCP: Ginger Organ., MD REFERRING PROVIDER: Ludwig Clarks, DO   PT End of Session - 03/19/22 1121     Visit Number 9    Number of Visits 17    Date for PT Re-Evaluation 04/09/22    Authorization Type BCBS Medicare    PT Start Time 1121    PT Stop Time 1200    PT Time Calculation (min) 39 min    Behavior During Therapy WFL for tasks assessed/performed                  Past Medical History:  Diagnosis Date   Allergic rhinitis    Anemia    Arthritis    Asthma    Blood clot in vein  age 39   left leg   Depression    GERD (gastroesophageal reflux disease)    Hyperlipidemia    Hypertension    pt denies, in physician record   Hypothyroidism    Kidney stones    Multiple pregnancy loss, not currently pregnant    multiple miscarriages-pt.unsure of #   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 lumpectomy, benign   colonscopy  2011   cystoscopy  2012   JOINT REPLACEMENT  05-2007   right knee replacment/revision   KIDNEY STONE SURGERY     LEFT HEART CATH AND CORONARY ANGIOGRAPHY N/A 02/21/2020   Procedure: LEFT HEART CATH AND CORONARY ANGIOGRAPHY;  Surgeon: Adrian Prows, MD;  Location: [facility];  Service: Cardiovascular;  Laterality: N/A;   REPLACEMENT TOTAL KNEE Left 06/05/12   TOTAL KNEE REVISION  06/03/2012   Procedure: TOTAL KNEE REVISION;  Surgeon: Gearlean Alf, MD;  Location: [facility];  Service: Orthopedics;  Laterality: Left;  Revision of a Left Uni Knee to a Total Knee Arthroplasty   Patient Active Problem List   Diagnosis Date  Noted   Prolonged Q-T interval on ECG    Class 1 obesity due to excess calories with serious comorbidity and body mass index (BMI) of 34.0 to 34.9 in adult    Community acquired pneumonia of right lower lobe of lung 02/17/2020   Mild intermittent asthma with (acute) exacerbation 02/17/2020   Acute respiratory failure with hypoxia ([place]) 02/17/2020   Acute combined systolic and diastolic heart failure (HCC)    Cardiomyopathy ([place])    Type 2 myocardial infarction ([place])    Elevated troponin    Sinus tachycardia    LBBB (left bundle branch block)    Shortness of breath    Hypertensive heart and kidney disease with HF and with CKD stage I-IV ([place])    Mixed hyperlipidemia    Hx of deep venous thrombosis    Multiple system atrophy, 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/19/2013   Thrush 06/15/2012   S/P total knee arthroplasty 06/15/2012   Fall 06/13/2012   Urinary incontinence 06/13/2012   Postop Acute blood loss anemia 06/04/2012   Postop Hypokalemia 06/04/2012   Pain due to unicompartmental arthroplasty of knee ([place]) 06/03/2012  GERD 03/18/2008   GI BLEED 03/18/2008   DYSPHAGIA 03/18/2008   FECAL OCCULT BLOOD 03/18/2008    ONSET DATE: past several years  REFERRING DIAG: G23.2 (ICD-10-CM) - Multiple system atrophy, 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HCC)  THERAPY DIAG:  Unsteadiness on feet  Muscle weakness (generalized)  Multiple system atrophy, 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HCC)  Rationale for Evaluation and Treatment Rehabilitation  SUBJECTIVE:  "It's(PT) going well.  My arms seem stronger".    Pt accompanied by: self, husband  PERTINENT HISTORY: anemia, asthma, depression, GERD, HLD, HTN, MSA, osteoporosis, Parkinsonism, B TKA  PAIN:  Are you having pain? No : NPRS scale: 0/10 Pain location:  Aggravating factors:   *Complains of pain in Lt shoulder, only if she moves it in certain motions    PRECAUTIONS: Fall  PATIENT GOALS improve strength  OBJECTIVE:  Below measures were taken at time of  initial evaluation unless otherwise specified:   DIAGNOSTIC FINDINGS: none recent  COGNITION: Overall cognitive status: No family/caregiver present to determine baseline cognitive functioning  OBSERVATION: chorea throughout session; tendency for R lateral lean   SENSATION: WFL  COORDINATION: Alt pronation/supination B intact Finger to nose B intact Heel to shin B intact  MUSCLE TONE: slightly low tone in B LEs  POSTURE: rounded shoulders, forward head, increased thoracic kyphosis, and posterior pelvic tilt  LOWER EXTREMITY ROM:     Active  Right Eval Left Eval  Hip flexion    Hip extension    Hip abduction    Hip adduction    Hip internal rotation    Hip external rotation    Knee flexion    Knee extension    Ankle dorsiflexion 11 10  Ankle plantarflexion    Ankle inversion    Ankle eversion     (Blank rows = not tested)  LOWER EXTREMITY MMT:    MMT (in sitting) Right Eval Left Eval  Hip flexion 4 4  Hip extension    Hip abduction 4 4  Hip adduction 4- 4  Hip internal rotation    Hip external rotation    Knee flexion 4+ 4+  Knee extension 4+ 4  Ankle dorsiflexion 4 4  Ankle plantarflexion 4+ 4+  Ankle inversion    Ankle eversion    (Blank rows = not tested)  TRANSFERS: Sit to stand:  heavy reliance on UEs  Stand to sit:  when using walker, unsafe navigation/use of walker    GAIT: Gait pattern:  forward trunk lean throughout, ambulating with walker close to BOS Assistive device utilized: Walker - 4 wheeled Level of assistance: Modified independence   FUNCTIONAL TESTs:     TODAY'S TREATMENT: 03/19/22 Pt seen for aquatic therapy today.  Treatment took place in water 3.25-4.5 ft in depth at the [facility]. Temp of water was 92.  Pt entered/exited the pool via steps with handrails rail.  (Therapist in water with SBA due to pt's LOB last session in water)  Walking UE supported on white barbell  forward and back; side  stepping Side stepping with arm abdct/add  Holding white barbell:  tandem gait forward/ backward  Blue noodle pull downs to thighs with ab set Seated on bench with blue step under feet - STS x 10 with occasional CGA to steady Alternating toe taps to 5" step without UE support Squats without UE support Holding white barbell:  forward/backward gait working on changing speed and direction  Back against wall : SKTC x 2 each to reduce low back discomfort from previous exercise Holding  yellow hand buoys:  SLS x 10 sec x 3 reps each (challenging)  Supported supine (per pt request) with noodle under arms and knees   Pt requires the buoyancy and hydrostatic pressure of water for support, and to offload joints by unweighting joint load by at least 50 % in navel deep water and by at least 75-80% in chest to neck deep water.  Viscosity of the water is needed for resistance of strengthening. Water current perturbations provides challenge to standing balance requiring increased core activation.     PATIENT EDUCATION: Education details: aquatic exercise form and posture  Person educated: Patient Education method: Explanation, Demonstration, Tactile cues, Verbal cues Education comprehension: verbalized understanding   HOME EXERCISE PROGRAM: Access Code: [email or web address] URL: [email or web address] Date: 02/12/2022 Prepared by: [facility]  Exercises - Supine Bridge with Mini Swiss Ball Between Knees  - 1 x daily - 5 x weekly - 2 sets - 10 reps - Semi-Tandem Balance at Counter Top Eyes OPEN - 1 x daily - 5 x weekly - 2 sets - 30 sec hold - Mini Squat with Counter Support  - 1 x daily - 5 x weekly - 2 sets - 10 reps - Standing Hip Abduction with Counter Support  - 1 x daily - 5 x weekly - 2 sets - 10 reps - Sit to Stand with Counter Support  - 1 x daily - 5 x weekly - 2 sets - 10 reps    GOALS: Goals reviewed with patient? Yes  SHORT TERM GOALS:  Target date: 03/05/2022  Patient to be independent with initial HEP. Baseline: HEP initiated Goal status: IN PROGRESS    LONG TERM GOALS: Target date: 04/09/2022  Patient to be independent with advanced HEP. Baseline: Not yet initiated  Goal status: IN PROGRESS  Patient to demonstrate floor to stand transfer with min-mod A.  Baseline: NT Goal status: IN PROGRESS  Patient to score at least 43/56 on Berg in order to decrease risk of falls.   Baseline: 37/56 Goal status: IN PROGRESS  Patient to demonstrate 5xSTS test in <15 sec with hands on knees in order to decrease risk of falls.   Baseline: 12 sec with B armrests Goal status: IN PROGRESS  Patient to verbalize understanding of fall prevention in home environment information. Baseline: Not yet initiated Goal status: IN PROGRESS     ASSESSMENT:  CLINICAL IMPRESSION: Pt reported some increased back pain with gait in water with change in speed and direction; pain eliminated with knee to chest stretch. Therapist in water today due to LOB last session that pt wasn't able to self-correct.  No overt loss of balance today; able to self correct small losses of balance without difficulty.  Pt reported some light headedness at end of session; likely due to changing from supine to standing too fast.  Light headedness resolved quickly.  Continued sessions to advance functional strengthening and more dynamic balance activities to reduce risk for falls    OBJECTIVE IMPAIRMENTS Abnormal gait, decreased balance, decreased strength, decreased safety awareness, dizziness, increased muscle spasms, impaired flexibility, impaired tone, improper body mechanics, postural dysfunction, and pain.   ACTIVITY LIMITATIONS carrying, lifting, bending, standing, squatting, stairs, transfers, bed mobility, bathing, dressing, reach over head, and locomotion level  PARTICIPATION LIMITATIONS: meal prep, cleaning, laundry, shopping, community activity, school, and  church  PERSONAL FACTORS Age, Past/current experiences, Time since onset of injury/illness/exacerbation, and 3+ comorbidities: anemia, asthma, depression, GERD, HLD, HTN, MSA, osteoporosis, Parkinsonism, B  TKA  are also affecting patient's functional outcome.   REHAB POTENTIAL: Good  CLINICAL DECISION MAKING: Evolving/moderate complexity  EVALUATION COMPLEXITY: Moderate  PLAN: PT FREQUENCY: 2x/week  PT DURATION: 8 weeks  PLANNED INTERVENTIONS: Therapeutic exercises, Therapeutic activity, Neuromuscular re-education, Balance training, Gait training, Patient/Family education, Self Care, Joint mobilization, Stair training, Vestibular training, Canalith repositioning, Aquatic Therapy, Dry Needling, Electrical stimulation, Cryotherapy, Moist heat, Taping, Manual therapy, and Re-evaluation  PLAN FOR NEXT SESSION: progress hip strengthening, static and dynamic balance-- 10th visit progress note.  Kerin Perna, PTA 03/19/22 12:04 PM

## 2022-03-21 ENCOUNTER — Encounter: Payer: Self-pay | Admitting: Physical Therapy

## 2022-03-21 ENCOUNTER — Ambulatory Visit: Payer: Medicare Other | Admitting: Physical Therapy

## 2022-03-25 ENCOUNTER — Ambulatory Visit: Payer: Medicare Other | Admitting: Internal Medicine

## 2022-03-25 ENCOUNTER — Encounter: Payer: Self-pay | Admitting: Internal Medicine

## 2022-03-25 VITALS — BP 112/68 | HR 68 | Temp 97.7°F | Ht 66.0 in | Wt 175.4 lb

## 2022-03-25 DIAGNOSIS — J310 Chronic rhinitis: Secondary | ICD-10-CM

## 2022-03-25 DIAGNOSIS — J4540 Moderate persistent asthma, uncomplicated: Secondary | ICD-10-CM

## 2022-03-25 DIAGNOSIS — K219 Gastro-esophageal reflux disease without esophagitis: Secondary | ICD-10-CM | POA: Diagnosis not present

## 2022-03-25 MED ORDER — SPIRIVA RESPIMAT 1.25 MCG/ACT IN AERS: 2.0000 | INHALATION_SPRAY | Freq: Every day | RESPIRATORY_TRACT | 0 refills | Status: DC

## 2022-03-25 NOTE — Progress Notes (Signed)
Rachel [name]    [id number]    1950/10/28  Primary Care Physician:[name], Rachel Filbert., MD Date of Appointment: 03/25/2022 Established Patient Visit  Chief complaint:   Chief Complaint  Patient presents with   Follow-up    She is doing a litter better since last time. She feels the Spiriva is working well for her.     HPI: Rachel [name] is a 71 y.o. woman with multi-system atrophy who presents for follow up for: asthma, GERD.  Interval Updates: Here for asthma follow up. At last visit we added spiriva to her visit which has helped a lot. Has reduced the albuterol neb and mdi use.  However the cost is not affordable - was $47 and now is $137  Reflux has improved somewhat.  Her MSA episodes are worsening with shaking and weakness.   Current Regimen: spiriva, Dulera 200 2 puffs twice a day prn albuterol. Montelukast.  Asthma Triggers: anxiety, exertion, seasonal allergies.  Exacerbations in the last year: 2 History of hospitalization or intubation: never Hives: none Allergy Testing: a long time ago, doesn't remember.  GERD: yes, on PPI Allergic Rhinitis: yes  Asthma Control Test:  Asthma Control Test ACT Total Score  03/25/2022  3:28 PM 23  12/20/2021  3:43 PM 14  12/20/2019  9:46 AM 22  FeNO: 10 ppb  I have reviewed the patient's family social and past medical history and updated as appropriate.   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 lumpectomy, benign   colonscopy  2011   cystoscopy  2012   JOINT REPLACEMENT  05-2007   right knee replacment/revision   KIDNEY STONE SURGERY     LEFT HEART CATH AND CORONARY ANGIOGRAPHY N/A 02/21/2020   Procedure: LEFT HEART CATH AND CORONARY ANGIOGRAPHY;  Surgeon: Adrian Prows, MD;  Location: [facility];  Service: Cardiovascular;  Laterality: N/A;   REPLACEMENT TOTAL KNEE Left 06/05/12   TOTAL KNEE REVISION  06/03/2012   Procedure: TOTAL KNEE REVISION;  Surgeon: Gearlean Alf, MD;  Location: [facility];  Service: Orthopedics;  Laterality: Left;  Revision of a Left Uni Knee to a Total Knee Arthroplasty    Family History  Problem Relation Age of Onset   Diabetes Mother    Heart failure Mother        CHF   Thyroid disease Mother    Heart failure Father        CHF   Heart attack Brother    Breast cancer Sister    Asthma Sister    Heart failure Sister  Social History   Occupational History   Occupation: retired/disabled    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  Tobacco Use   Smoking status: Never   Smokeless tobacco: Never  Vaping Use   Vaping Use: Never used  Substance and Sexual Activity   Alcohol use: No    Alcohol/week: 0.0 standard drinks of alcohol   Drug use: No   Sexual activity: Never    Partners: Male    Birth control/protection: Post-menopausal   Physical Exam: Vitals:   03/25/22 1539  BP: 112/68  Pulse: 68  Temp: 97.7 F (36.5 C)  SpO2: 98%    Gen: well appearing, ambulates with walker ENT: no thrush, mild erythema in oropharynx Resp: ctab no wheezes or crackles CV: RRR no mrg Neuro: tremor with frequent spontaneous limb movements, seemingly involuntary  Data Reviewed: Imaging: Personally reviewed CTPE study Dec 2019 - negative for PE, +mosaic attentuation and air trapping.   PFTs:      Latest Ref Rng & Units 08/20/2019    3:59 PM  PFT Results  FVC-Pre L 2.35   FVC-Predicted Pre % 71   FVC-Post L 2.32   FVC-Predicted Post % 70   Pre FEV1/FVC % % 67   Post FEV1/FCV % % 68   FEV1-Pre L 1.56   FEV1-Predicted Pre % 62   FEV1-Post L 1.57   DLCO uncorrected ml/min/mmHg 21.15   DLCO UNC% % 101   DLVA Predicted % 132   TLC L 5.87   TLC % Predicted % 109   RV % Predicted % 141    I have personally reviewed the patient's PFTs and they are notable for moderate airflow limitation without a bronchodilator response.  FeNO 10 ppb - not elevated.   Labs: Lab Results  Component Value Date   WBC 5.7 12/18/2021   HGB 11.8 (L) 12/18/2021   HCT 35.0 (L) 12/18/2021   MCV 89.5 12/18/2021   PLT 288 12/18/2021    Immunization status: Immunization History  Administered Date(s) Administered   PFIZER(Purple Top)SARS-COV-2 Vaccination 09/19/2019, 10/13/2019   Tdap 04/24/2013   Zoster, Live 10/27/2012    Assessment:  Rachel [name] is a 71 y.o.  woman with a history of multiple system atrophy with a resting tremor who presents with: Moderate persistent asthma with improved control.  Gastroesophageal reflux disease poorly controlled Allergic Rhinitis - poorly controlled  Plan/Recommendations: Continue dulera 200 mg 2 puffs twice a day.   I will see if we can get the spiriva equivalent nebulizer medication covered through Part [facility].   Continue singulair and your reflux medication.   Continue montelukast for rhinitis continue PPI for GERD.   Return to Care: Return in about 6 months (around 09/25/2022).  Lenice Llamas, MD Pulmonary and [place]

## 2022-03-25 NOTE — Patient Instructions (Addendum)
Please schedule follow up scheduled with myself in 6 months.  If my schedule is not open yet, we will contact you with a reminder closer to that time. Please call [phone] if you haven't heard from [place] a month before.   I will see if we can get the spiriva equivalent nebulizer medication covered through Part B.  Continue dulera 2 puffs twice a day.   Continue singulair and your reflux medication.

## 2022-03-26 ENCOUNTER — Encounter (HOSPITAL_BASED_OUTPATIENT_CLINIC_OR_DEPARTMENT_OTHER): Payer: Self-pay | Admitting: Physical Therapy

## 2022-03-26 ENCOUNTER — Ambulatory Visit (HOSPITAL_BASED_OUTPATIENT_CLINIC_OR_DEPARTMENT_OTHER): Payer: Medicare Other | Admitting: Physical Therapy

## 2022-03-26 DIAGNOSIS — M6281 Muscle weakness (generalized): Secondary | ICD-10-CM | POA: Diagnosis not present

## 2022-03-26 DIAGNOSIS — G232 Striatonigral degeneration: Secondary | ICD-10-CM | POA: Diagnosis not present

## 2022-03-26 DIAGNOSIS — R2681 Unsteadiness on feet: Secondary | ICD-10-CM | POA: Diagnosis not present

## 2022-03-26 DIAGNOSIS — R42 Dizziness and giddiness: Secondary | ICD-10-CM | POA: Diagnosis not present

## 2022-03-26 NOTE — Therapy (Signed)
OUTPATIENT PHYSICAL THERAPY NEURO TREATMENT  Progress Note Reporting Period 02/12/22 to 03/26/22  See note below for Objective Data and Assessment of Progress/Goals.     Patient Name: Rachel [name] MRN: [id number] DOB:[birth year], 71 y.o., female Today's Date: 03/26/2022   PCP: Ginger Organ., MD REFERRING PROVIDER: Ludwig Clarks, DO   PT End of Session - 03/26/22 1449     Visit Number 10    Number of Visits 17    Date for PT Re-Evaluation 04/09/22    Authorization Type BCBS Medicare    PT Start Time 1400    PT Stop Time 2841    PT Time Calculation (min) 45 min    Behavior During Therapy WFL for tasks assessed/performed                   Past Medical History:  Diagnosis Date   Allergic rhinitis    Anemia    Arthritis    Asthma    Blood clot in vein  age 71   left leg   Depression    GERD (gastroesophageal reflux disease)    Hyperlipidemia    Hypertension    pt denies, in physician record   Hypothyroidism    Kidney stones    Multiple pregnancy loss, not currently pregnant    multiple miscarriages-pt.unsure of #   Multiple system atrophy ([place]) 2015   Nephrolithiasis    hx of kidney stones, last 6 months ago   Osteopenia    Osteoporosis    Parkinsonism ([place])    Tendinitis    left foot   Vitamin D deficiency    Past Surgical History:  Procedure Laterality Date   BREAST EXCISIONAL BIOPSY Right    BREAST SURGERY     rt. lumpectomy, benign   colonscopy  2011   cystoscopy  2012   JOINT REPLACEMENT  05-2007   right knee replacment/revision   KIDNEY STONE SURGERY     LEFT HEART CATH AND CORONARY ANGIOGRAPHY N/A 02/21/2020   Procedure: LEFT HEART CATH AND CORONARY ANGIOGRAPHY;  Surgeon: Adrian Prows, MD;  Location: [facility];  Service: Cardiovascular;  Laterality: N/A;   REPLACEMENT TOTAL KNEE Left 06/05/12   TOTAL KNEE REVISION  06/03/2012   Procedure: TOTAL KNEE REVISION;  Surgeon: Gearlean Alf, MD;  Location: [facility];  Service:  Orthopedics;  Laterality: Left;  Revision of a Left Uni Knee to a Total Knee Arthroplasty   Patient Active Problem List   Diagnosis Date Noted   Prolonged Q-T interval on ECG    Class 1 obesity due to excess calories with serious comorbidity and body mass index (BMI) of 34.0 to 34.9 in adult    Community acquired pneumonia of right lower lobe of lung 02/17/2020   Mild intermittent asthma with (acute) exacerbation 02/17/2020   Acute respiratory failure with hypoxia ([place]) 02/17/2020   Acute combined systolic and diastolic heart failure (HCC)    Cardiomyopathy (HCC)    Type 2 myocardial infarction ([place])    Elevated troponin    Sinus tachycardia    LBBB (left bundle branch block)    Shortness of breath    Hypertensive heart and kidney disease with HF and with CKD stage I-IV ([place])    Mixed hyperlipidemia    Hx of deep venous thrombosis    Multiple system atrophy, 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/19/2013   Thrush 06/15/2012   S/P total knee arthroplasty 06/15/2012   Fall 06/13/2012   Urinary incontinence 06/13/2012   Postop  Acute blood loss anemia 06/04/2012   Postop Hypokalemia 06/04/2012   Pain due to unicompartmental arthroplasty of knee ([place]) 06/03/2012   GERD 03/18/2008   GI BLEED 03/18/2008   DYSPHAGIA 03/18/2008   FECAL OCCULT BLOOD 03/18/2008    ONSET DATE: past several years  REFERRING DIAG: G23.2 (ICD-10-CM) - Multiple system atrophy, 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HCC)  THERAPY DIAG:  Unsteadiness on feet  Muscle weakness (generalized)  Dizziness and giddiness  Rationale for Evaluation and Treatment Rehabilitation  SUBJECTIVE:  "Husband states pt has had a bad few days, weak.".    Pt accompanied by: self, husband  PERTINENT HISTORY: anemia, asthma, depression, GERD, HLD, HTN, MSA, osteoporosis, Parkinsonism, B TKA  PAIN:  Are you having pain? No : NPRS scale: 0/10 Pain location:  Aggravating factors:   *Complains of pain in Lt shoulder, only if she moves it in certain  motions    PRECAUTIONS: Fall  PATIENT GOALS improve strength  OBJECTIVE:  Below measures were taken at time of initial evaluation unless otherwise specified:   DIAGNOSTIC FINDINGS: none recent  COGNITION: Overall cognitive status: No family/caregiver present to determine baseline cognitive functioning  OBSERVATION: chorea throughout session; tendency for R lateral lean   SENSATION: WFL  COORDINATION: Alt pronation/supination B intact Finger to nose B intact Heel to shin B intact  MUSCLE TONE: slightly low tone in B LEs  POSTURE: rounded shoulders, forward head, increased thoracic kyphosis, and posterior pelvic tilt  LOWER EXTREMITY ROM:     Active  Right Eval Left Eval  Hip flexion    Hip extension    Hip abduction    Hip adduction    Hip internal rotation    Hip external rotation    Knee flexion    Knee extension    Ankle dorsiflexion 11 10  Ankle plantarflexion    Ankle inversion    Ankle eversion     (Blank rows = not tested)  LOWER EXTREMITY MMT:    MMT (in sitting) Right Eval Left Eval  Hip flexion 4 4  Hip extension    Hip abduction 4 4  Hip adduction 4- 4  Hip internal rotation    Hip external rotation    Knee flexion 4+ 4+  Knee extension 4+ 4  Ankle dorsiflexion 4 4  Ankle plantarflexion 4+ 4+  Ankle inversion    Ankle eversion    (Blank rows = not tested)  TRANSFERS: Sit to stand:  heavy reliance on UEs  Stand to sit:  when using walker, unsafe navigation/use of walker    GAIT: Gait pattern:  forward trunk lean throughout, ambulating with walker close to BOS Assistive device utilized: Walker - 4 wheeled Level of assistance: Modified independence   FUNCTIONAL TESTs:     TODAY'S TREATMENT: 03/19/22 Pt seen for aquatic therapy today.  Treatment took place in water 3.25-4.5 ft in depth at the [facility]. Temp of water was 92.  Pt entered/exited the pool via steps with handrails rail.  (Therapist in water  with SBA due to pt's LOB first session)  Walking UE supported on white barbell  forward and back; side stepping Side stepping with arm abdct/add  Holding white barbell:  tandem gait forward Blue noodle pull downs to thighs with ab set Seated on bench with blue step under feet - STS x 10. Immediate standing balance gained indep 7/10x Alternating toe taps to 5" step ue support white barbell Squats UE support white barbell Holding white barbell:  forward/backward gait working on changing  speed and direction    Pt requires the buoyancy and hydrostatic pressure of water for support, and to offload joints by unweighting joint load by at least 50 % in navel deep water and by at least 75-80% in chest to neck deep water.  Viscosity of the water is needed for resistance of strengthening. Water current perturbations provides challenge to standing balance requiring increased core activation.     PATIENT EDUCATION: Education details: aquatic exercise form and posture  Person educated: Patient Education method: Explanation, Demonstration, Tactile cues, Verbal cues Education comprehension: verbalized understanding   HOME EXERCISE PROGRAM: Access Code: [email or web address] URL: [email or web address] Date: 02/12/2022 Prepared by: [facility]  Exercises - Supine Bridge with Mini Swiss Ball Between Knees  - 1 x daily - 5 x weekly - 2 sets - 10 reps - Semi-Tandem Balance at Counter Top Eyes OPEN - 1 x daily - 5 x weekly - 2 sets - 30 sec hold - Mini Squat with Counter Support  - 1 x daily - 5 x weekly - 2 sets - 10 reps - Standing Hip Abduction with Counter Support  - 1 x daily - 5 x weekly - 2 sets - 10 reps - Sit to Stand with Counter Support  - 1 x daily - 5 x weekly - 2 sets - 10 reps    GOALS: Goals reviewed with patient? Yes  SHORT TERM GOALS: Target date: 03/05/2022  Patient to be independent with initial HEP. Baseline: HEP initiated Goal status:  IN PROGRESS    LONG TERM GOALS: Target date: 04/09/2022  Patient to be independent with advanced HEP. Baseline: Not yet initiated  Goal status: IN PROGRESS  Patient to demonstrate floor to stand transfer with min-mod A.  Baseline: NT Goal status: IN PROGRESS  Patient to score at least 43/56 on Berg in order to decrease risk of falls.   Baseline: 37/56 Goal status: IN PROGRESS  Patient to demonstrate 5xSTS test in <15 sec with hands on knees in order to decrease risk of falls.   Baseline: 12 sec with B armrests Goal status: IN PROGRESS  Patient to verbalize understanding of fall prevention in home environment information. Baseline: Not yet initiated Goal status: IN PROGRESS     ASSESSMENT:  CLINICAL IMPRESSION: Pt reports legs giving out in church on Sunday.  Members caught her and put her in a wheel chair.  She was unable to tolerate last on land therapy session due to not feeling well. (See note).  Today pt presents with husband in transport chair.  He reports she walked half way to pool then he pushed her the remaining way. Her resting tremors are increased today. She reports increased fatigue in LE with tremors. Spent majority of session completing exercises seated.  Progress note: Pt with HEP and reports some compliance with as she tolerates.  Unable to complete functional testing as not tolerated today.  Although she is having some declines in past few days she has overall made progress with amb ability and distance walking to and from pool (other than today) 500 ft in each direction. Her endurance overall  improved tolerating 30-40 min of activity engaging in physical therapy. Objective testing to be completed on next session on land.       OBJECTIVE IMPAIRMENTS Abnormal gait, decreased balance, decreased strength, decreased safety awareness, dizziness, increased muscle spasms, impaired flexibility, impaired tone, improper body mechanics, postural dysfunction, and pain.    ACTIVITY LIMITATIONS carrying, lifting, bending,  standing, squatting, stairs, transfers, bed mobility, bathing, dressing, reach over head, and locomotion level  PARTICIPATION LIMITATIONS: meal prep, cleaning, laundry, shopping, community activity, school, and church  PERSONAL FACTORS Age, Past/current experiences, Time since onset of injury/illness/exacerbation, and 3+ comorbidities: anemia, asthma, depression, GERD, HLD, HTN, MSA, osteoporosis, Parkinsonism, B TKA  are also affecting patient's functional outcome.   REHAB POTENTIAL: Good  CLINICAL DECISION MAKING: Evolving/moderate complexity  EVALUATION COMPLEXITY: Moderate  PLAN: PT FREQUENCY: 2x/week  PT DURATION: 8 weeks  PLANNED INTERVENTIONS: Therapeutic exercises, Therapeutic activity, Neuromuscular re-education, Balance training, Gait training, Patient/Family education, Self Care, Joint mobilization, Stair training, Vestibular training, Canalith repositioning, Aquatic Therapy, Dry Needling, Electrical stimulation, Cryotherapy, Moist heat, Taping, Manual therapy, and Re-evaluation  PLAN FOR NEXT SESSION: Complete objective testing. progress hip strengthening, static and dynamic balance-- 10th visit progress note.  [facility]) Tyhesha Dutson MPT 03/26/22 712p

## 2022-03-28 ENCOUNTER — Ambulatory Visit: Payer: Medicare Other | Admitting: Physical Therapy

## 2022-04-01 ENCOUNTER — Encounter: Payer: Self-pay | Admitting: Internal Medicine

## 2022-04-02 ENCOUNTER — Telehealth: Payer: Self-pay

## 2022-04-02 ENCOUNTER — Other Ambulatory Visit: Payer: Self-pay

## 2022-04-02 ENCOUNTER — Other Ambulatory Visit (HOSPITAL_COMMUNITY): Payer: Self-pay

## 2022-04-02 MED ORDER — REVEFENACIN 175 MCG/3ML IN SOLN: 175.0000 ug | Freq: Every day | RESPIRATORY_TRACT | 3 refills | Status: DC

## 2022-04-02 NOTE — Telephone Encounter (Signed)
CC chart message:  Spero Geralds, MD  Dierdre Highman, RN Can you please send yupelri to her DME so we can try to get it covered through medicare part B? Otherwise she'll just be in the donut hole for spiriva for the rest of hthe year.   Yupelri sent through [employer]. Nothing further needed at this time.

## 2022-04-03 NOTE — Therapy (Addendum)
OUTPATIENT PHYSICAL THERAPY NOTE   Patient Name: Rachel [name] MRN: [id number] DOB:01-06-1951, 71 y.o., female Today's Date: 04/04/2022   Progress Note Reporting Period 04/04/22 to 04/04/22  See note below for Objective Data and Assessment of Progress/Goals.    PCP: Ginger Organ., MD REFERRING PROVIDER: Ludwig Clarks, DO   PT End of Session - 04/04/22 1520     Visit Number 11    Number of Visits 17    Date for PT Re-Evaluation 04/09/22    Authorization Type BCBS Medicare    PT Start Time 2025    PT Stop Time 1516    PT Time Calculation (min) 23 min    Equipment Utilized During Treatment Gait belt    Activity Tolerance Patient limited by pain;Patient limited by fatigue;Other (comment)   dizziness   Behavior During Therapy WFL for tasks assessed/performed                Past Medical History:  Diagnosis Date   Allergic rhinitis    Anemia    Arthritis    Asthma    Blood clot in vein  age 62   left leg   Depression    GERD (gastroesophageal reflux disease)    Hyperlipidemia    Hypertension    pt denies, in physician record   Hypothyroidism    Kidney stones    Multiple pregnancy loss, not currently pregnant    multiple miscarriages-pt.unsure of #   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 lumpectomy, benign   colonscopy  2011   cystoscopy  2012   JOINT REPLACEMENT  05-2007   right knee replacment/revision   KIDNEY STONE SURGERY     LEFT HEART CATH AND CORONARY ANGIOGRAPHY N/A 02/21/2020   Procedure: LEFT HEART CATH AND CORONARY ANGIOGRAPHY;  Surgeon: Adrian Prows, MD;  Location: [facility];  Service: Cardiovascular;  Laterality: N/A;   REPLACEMENT TOTAL KNEE Left 06/05/12    TOTAL KNEE REVISION  06/03/2012   Procedure: TOTAL KNEE REVISION;  Surgeon: Gearlean Alf, MD;  Location: [facility];  Service: Orthopedics;  Laterality: Left;  Revision of a Left Uni Knee to a Total Knee Arthroplasty   Patient Active Problem List   Diagnosis Date Noted   Prolonged Q-T interval on ECG    Class 1 obesity due to excess calories with serious comorbidity and body mass index (BMI) of 34.0 to 34.9 in adult    Community acquired pneumonia of right lower lobe of lung 02/17/2020   Mild intermittent asthma with (acute) exacerbation 02/17/2020   Acute respiratory failure with hypoxia ([place]) 02/17/2020   Acute combined systolic and diastolic heart failure (HCC)    Cardiomyopathy (HCC)    Type 2 myocardial infarction (HCC)    Elevated troponin    Sinus tachycardia    LBBB (left bundle branch block)    Shortness of breath    Hypertensive heart and kidney disease with HF and with CKD stage I-IV ([place])    Mixed hyperlipidemia    Hx of deep venous thrombosis    Multiple system atrophy, 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[place]) 11/19/2013   Thrush  06/15/2012   S/P total knee arthroplasty 06/15/2012   Fall 06/13/2012   Urinary incontinence 06/13/2012   Postop Acute blood loss anemia 06/04/2012   Postop Hypokalemia 06/04/2012   Pain due to unicompartmental arthroplasty of knee ([place]) 06/03/2012   GERD 03/18/2008   GI BLEED 03/18/2008   DYSPHAGIA 03/18/2008   FECAL OCCULT BLOOD 03/18/2008    ONSET DATE: past several years  REFERRING DIAG: G23.2 (ICD-10-CM) - Multiple system atrophy, 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HCC)  THERAPY DIAG:  Unsteadiness on feet  Muscle weakness (generalized)  Dizziness and giddiness  Other muscle spasm  Rationale for Evaluation and Treatment Rehabilitation  SUBJECTIVE:   Having another bad day. Feeling really dizzy when I stand up and having increased tremors. Has seen the Pulmonologist without new concerns but has not seen anyone else about her episodes of dizziness/imbalance.   Pt accompanied by: self, husband  PERTINENT HISTORY: anemia, asthma, depression, GERD, HLD, HTN, MSA, osteoporosis, Parkinsonism, B TKA  PAIN:  Are you having pain? Yes- c/o mild LBP  PAIN:  Are you having pain? Yes: NPRS scale: 8-9/10 Pain location: neck and head Aggravating factors: standing, walking    PRECAUTIONS: Fall  PATIENT GOALS improve strength  OBJECTIVE:     TODAY'S TREATMENT: 04/04/22 Activity Comments  Vitals  95/60 mmHg, 97% spO2, 67 bpm  Vitals after water break  99/68 mmHg  Attempted to perform STS from w/c but patient limited by c/o dizziness      PATIENT EDUCATION: Education details: discussed patient's current symptoms with patient and husband, encouraged increased hydration and importance of f/u with PCP or neurology d/t symptom intensity and duration Person educated: Patient and Spouse Education method: Explanation Education comprehension: verbalized understanding   Access Code: [email or web address] URL: [email or web address] Date: 02/22/2022-most recent updates Prepared by: [facility]  Exercises - Supine Bridge with Mini Swiss Ball Between Knees  - 1 x daily - 5 x weekly - 2 sets - 10 reps - Semi-Tandem Balance at Counter Top Eyes Closed  - 1 x daily - 5 x weekly - 2 sets - 30 sec hold - Mini Squat with Counter Support  - 1 x daily - 5 x weekly - 2 sets - 10 reps - Standing Hip Abduction with Counter Support  - 1 x daily - 5 x weekly - 2 sets - 10 reps - Sit to Stand with Counter Support  - 1 x daily - 5 x weekly - 2 sets - 10 reps - Side to side weightshift  - 1 x daily - 5 x weekly - 1-2 sets - 10 reps - Staggered Stance Forward Backward Weight Shift with Counter Support  - 1 x daily - 5 x weekly - 1-2 sets - 10 reps   Below measures were taken at time of initial evaluation unless otherwise specified:   DIAGNOSTIC FINDINGS: none recent  COGNITION: Overall cognitive status: No family/caregiver  present to determine baseline cognitive functioning  OBSERVATION: chorea throughout session; tendency for R lateral lean   SENSATION: WFL  COORDINATION: Alt pronation/supination B intact Finger to nose B intact Heel to shin B intact  MUSCLE TONE: slightly low tone in B LEs  POSTURE: rounded shoulders, forward head, increased thoracic kyphosis, and posterior pelvic tilt  LOWER EXTREMITY ROM:     Active  Right Eval Left Eval  Hip flexion    Hip extension    Hip abduction    Hip adduction    Hip internal rotation  Hip external rotation    Knee flexion    Knee extension    Ankle dorsiflexion 11 10  Ankle plantarflexion    Ankle inversion    Ankle eversion     (Blank rows = not tested)  LOWER EXTREMITY MMT:    MMT (in sitting) Right Eval Left Eval  Hip flexion 4 4  Hip extension    Hip abduction 4 4  Hip adduction 4- 4  Hip internal rotation    Hip external rotation    Knee flexion 4+ 4+  Knee extension 4+ 4  Ankle dorsiflexion 4 4  Ankle plantarflexion 4+ 4+  Ankle inversion    Ankle eversion    (Blank rows = not tested)  TRANSFERS: Sit to stand:  heavy reliance on UEs  Stand to sit:  when using walker, unsafe navigation/use of walker    GAIT: Gait pattern:  forward trunk lean throughout, ambulating with walker close to BOS Assistive device utilized: Walker - 4 wheeled Level of assistance: Modified independence   FUNCTIONAL TESTs:     TREATMENT 03/12/22 Pt seen for aquatic therapy today.  Treatment took place in water 3.25-4.5 ft in depth at the [facility]. Temp of water was 91.  Pt entered/exited the pool via steps with handrails rail.  Intro to setting Walking ue supported on yellow noodle 3 ft forward, back and side stepping Standing ue supported on wall: df; pf;dd/abd; hip extension; marching x 10-12 Seated on 4th step (bottom) flutter kicking; add/abd 3 x 20 reps Standing  balance ue supported with yellow hand buoys: feet  together holding x 30 secs; tandem with some unsteadiness but holds x20s leading R/L. Unilateral stance unable to hold > 3 s L>R. Side stepping with 1 foam hand buoy shoulder add/abd x 4 widths. VC and demonstration for proper execution.  Pt requires the buoyancy and hydrostatic pressure of water for support, and to offload joints by unweighting joint load by at least 50 % in navel deep water and by at least 75-80% in chest to neck deep water.  Viscosity of the water is needed for resistance of strengthening. Water current perturbations provides challenge to standing balance requiring increased core activation.     PATIENT EDUCATION: Education details: prognosis, POC, HEP; provided information on aquatic therapy and benefits of OT and ST Person educated: Patient Education method: Explanation, Demonstration, Tactile cues, Verbal cues, and Handouts Education comprehension: verbalized understanding   HOME EXERCISE PROGRAM: Access Code: [email or web address] URL: [email or web address] Date: 02/12/2022 Prepared by: [facility]  Exercises - Supine Bridge with Mini Swiss Ball Between Knees  - 1 x daily - 5 x weekly - 2 sets - 10 reps - Semi-Tandem Balance at Counter Top Eyes OPEN - 1 x daily - 5 x weekly - 2 sets - 30 sec hold - Mini Squat with Counter Support  - 1 x daily - 5 x weekly - 2 sets - 10 reps - Standing Hip Abduction with Counter Support  - 1 x daily - 5 x weekly - 2 sets - 10 reps - Sit to Stand with Counter Support  - 1 x daily - 5 x weekly - 2 sets - 10 reps    GOALS: Goals reviewed with patient? Yes  SHORT TERM GOALS: Target date: 03/05/2022  Patient to be independent with initial HEP. Baseline: HEP initiated Goal status: IN PROGRESS    LONG TERM GOALS: Target date: 04/09/2022  Patient to be independent with advanced  HEP. Baseline: Not yet initiated  Goal status: IN PROGRESS  Patient to demonstrate floor to stand transfer with  min-mod A.  Baseline: NT Goal status: IN PROGRESS  Patient to score at least 43/56 on Berg in order to decrease risk of falls.   Baseline: 37/56 Goal status: IN PROGRESS  Patient to demonstrate 5xSTS test in <15 sec with hands on knees in order to decrease risk of falls.   Baseline: 12 sec with B armrests Goal status: IN PROGRESS  Patient to verbalize understanding of fall prevention in home environment information. Baseline: Not yet initiated Goal status: IN PROGRESS     ASSESSMENT:  CLINICAL IMPRESSION: Patient arrived to session in transport chair with husband. Notes that she continues not to feel well. Symptoms include dizziness when standing up, 8-9/10 neck and head pain, increased tremors, and diplopia. Vitals revealed hypotension which improved slightly with water break. Trial standing which was limited by dizziness. Discussed these symptoms with patient and her husband; d/t symptom intensity and duration of MSA excerebration, will place patient on hold from PT until symptoms are under control and treatment is better-tolerated. Patient and husband in agreement.     OBJECTIVE IMPAIRMENTS Abnormal gait, decreased balance, decreased strength, decreased safety awareness, dizziness, increased muscle spasms, impaired flexibility, impaired tone, improper body mechanics, postural dysfunction, and pain.   ACTIVITY LIMITATIONS carrying, lifting, bending, standing, squatting, stairs, transfers, bed mobility, bathing, dressing, reach over head, and locomotion level  PARTICIPATION LIMITATIONS: meal prep, cleaning, laundry, shopping, community activity, school, and church  PERSONAL FACTORS Age, Past/current experiences, Time since onset of injury/illness/exacerbation, and 3+ comorbidities: anemia, asthma, depression, GERD, HLD, HTN, MSA, osteoporosis, Parkinsonism, B TKA  are also affecting patient's functional outcome.   REHAB POTENTIAL: Good  CLINICAL DECISION MAKING: Evolving/moderate  complexity  EVALUATION COMPLEXITY: Moderate  PLAN: PT FREQUENCY: 2x/week  PT DURATION: 8 weeks  PLANNED INTERVENTIONS: Therapeutic exercises, Therapeutic activity, Neuromuscular re-education, Balance training, Gait training, Patient/Family education, Self Care, Joint mobilization, Stair training, Vestibular training, Canalith repositioning, Aquatic Therapy, Dry Needling, Electrical stimulation, Cryotherapy, Moist heat, Taping, Manual therapy, and Re-evaluation  PLAN FOR NEXT SESSION: hold PT until f/u with MD or symptoms improve    [name], PT, DPT 04/04/22 3:21 PM  [place] at [facility] [address] [place], [place] [address] Phone # [phone] Fax # [phone]   PHYSICAL THERAPY DISCHARGE SUMMARY  Visits from Start of Care: 11  Current functional level related to goals / functional outcomes: Unable to assess; patient did not return   Remaining deficits: Unable to assess    Education / Equipment: HEP  Plan: Patient agrees to discharge.  Patient goals were not met. Patient is being discharged due to not returning.    [name], PT, DPT 06/19/22 3:58 PM  [facility] at [facility] [address] [place], [place], [place] [address] Phone # [phone] Fax # [phone]

## 2022-04-04 ENCOUNTER — Telehealth: Payer: Self-pay | Admitting: Physical Therapy

## 2022-04-04 ENCOUNTER — Encounter: Payer: Self-pay | Admitting: Physical Therapy

## 2022-04-04 ENCOUNTER — Ambulatory Visit: Payer: Medicare Other | Admitting: Physical Therapy

## 2022-04-04 DIAGNOSIS — M62838 Other muscle spasm: Secondary | ICD-10-CM

## 2022-04-04 DIAGNOSIS — R42 Dizziness and giddiness: Secondary | ICD-10-CM | POA: Insufficient documentation

## 2022-04-04 DIAGNOSIS — G232 Striatonigral degeneration: Secondary | ICD-10-CM | POA: Insufficient documentation

## 2022-04-04 DIAGNOSIS — R2681 Unsteadiness on feet: Secondary | ICD-10-CM

## 2022-04-04 DIAGNOSIS — M6281 Muscle weakness (generalized): Secondary | ICD-10-CM

## 2022-04-04 NOTE — Telephone Encounter (Signed)
Hello,  I have been seeing Ms. Rachel [name] in [place] for treatment of [place]. She has been experiencing a significant decline in PT and exercise tolerance d/t what she is calling an MSA exacerbation. Today, she reported dizziness when standing, 8-9/10 neck and head pain, increased tremors, and worsening diplopia. She first mentioned increased diplopia about a month ago but refused to be seen emergently for this complaint. She was hypotensive during our session today and has not been able to tolerate our last couple sessions, so we are putting her on a hold until symptoms are under control. Please advise.  Thanks,  [name], PT, DPT 04/04/22 3:31 PM  [facility] at [facility] [address] [place], [place], [place] [address] Phone # [phone] Fax # [phone]

## 2022-04-09 ENCOUNTER — Ambulatory Visit: Payer: Medicare Other | Admitting: Physical Therapy

## 2022-04-16 DIAGNOSIS — H5010 Unspecified exotropia: Secondary | ICD-10-CM | POA: Diagnosis not present

## 2022-04-16 DIAGNOSIS — H532 Diplopia: Secondary | ICD-10-CM | POA: Diagnosis not present

## 2022-04-26 ENCOUNTER — Other Ambulatory Visit: Payer: Self-pay | Admitting: Neurology

## 2022-04-26 ENCOUNTER — Other Ambulatory Visit: Payer: Self-pay | Admitting: Cardiology

## 2022-04-26 DIAGNOSIS — I5032 Chronic diastolic (congestive) heart failure: Secondary | ICD-10-CM

## 2022-05-08 DIAGNOSIS — M25511 Pain in right shoulder: Secondary | ICD-10-CM | POA: Diagnosis not present

## 2022-05-08 DIAGNOSIS — M25512 Pain in left shoulder: Secondary | ICD-10-CM | POA: Diagnosis not present

## 2022-05-24 DIAGNOSIS — R0981 Nasal congestion: Secondary | ICD-10-CM | POA: Diagnosis not present

## 2022-05-24 DIAGNOSIS — J029 Acute pharyngitis, unspecified: Secondary | ICD-10-CM | POA: Diagnosis not present

## 2022-05-24 DIAGNOSIS — U071 COVID-19: Secondary | ICD-10-CM | POA: Diagnosis not present

## 2022-05-24 DIAGNOSIS — R5383 Other fatigue: Secondary | ICD-10-CM | POA: Diagnosis not present

## 2022-07-02 ENCOUNTER — Telehealth: Payer: Self-pay | Admitting: Neurology

## 2022-07-02 NOTE — Telephone Encounter (Signed)
Pt husband called and left a VM stating that the patient is starting to see things and people that are not there and he wants to know what we can do to help her or if he needs to bring her in sooner than her 08-08-22 appt

## 2022-07-10 ENCOUNTER — Other Ambulatory Visit: Payer: Self-pay

## 2022-07-10 MED ORDER — SACUBITRIL-VALSARTAN 24-26 MG PO TABS: 1.0000 | ORAL_TABLET | Freq: Two times a day (BID) | ORAL | 3 refills | Status: DC

## 2022-07-11 ENCOUNTER — Telehealth: Payer: Self-pay

## 2022-07-11 NOTE — Telephone Encounter (Signed)
Patient assistance for entresto has been faxed to [employer]

## 2022-07-12 NOTE — Progress Notes (Unsigned)
Assessment/Plan:   1.  MSA  -Continue carbidopa/levodopa 25/100 CR, 2 tablets at 9 AM, 2 tablets at noon, 2 tablets at 3 PM, 1 tablet at 6 PM and 2 tablets at bedtime.  -*** stop gabapentin and see if hallucinations persist  -check UA, cbc, chem given hallucinations  -can consider baclofen if needed in future for cramps 2.  Diplopia  -Saw Dr. Hassell Done years ago at [facility].  Diagnosis was congenital large intermittent exotropia/exophoria 3.  Dysphagia  -Last MBE in April, 2023.  No treatment was recommended.  Felt to be mild aspiration risk.  Mechanical soft diet, thin liquids with some regular solids as well recommended. 4.  Weight loss  -some due to nausea from med but not convinced that all of this is from meds.  F/u pcp 5.  Low BP  -pt on metoprolol and entresto.  Metoprolol has been decreased but may need continued decreased.  She is still having dizziness.    Subjective:   Rachel [name] was seen today in follow up for MSA.  My previous records were reviewed prior to todays visit as well as outside records available to me.  Last visit, we increased her gabapentin, as she thought it was helping tremor.  We requested aqua therapy.  They called the patient, but it appears that they did not get a return phone call to schedule.  However, she did complete physical therapy since last visit.  Her husband did call recently with regards to visual hallucinations and they are following up about that.  Current prescribed movement disorder medications:  Carbidopa/levodopa 25/100 CR,  2 tablets at 9 AM, 2 tablets at noon, 2 tablets at 3 PM, 1 tablet at 6 PM and 2 tablets at bedtime Gabapentin, 300 mg twice per day (increased)  Previous medications: Carbidopa/levodopa 25/100 IR; amantadine (stopped because of memory change/hallucinations); ALLERGIES:   Allergies  Allergen Reactions   Amantadine Hcl     Other reaction(s): Hallucinations   Ezetimibe     Other reaction(s):  disoriented/confusion   Symbicort [Budesonide-Formoterol Fumarate] Other (See Comments)    Shakes and muscle weakness   Fosamax [Alendronate] Other (See Comments)    headache   Miacalcin [Calcitonin]     Other reaction(s): headache   Azithromycin     Other reaction(s): thrush   Crestor [Rosuvastatin]     Other reaction(s): confusion   Ezetimibe-Simvastatin     Other reaction(s): myalgias    CURRENT MEDICATIONS:  Outpatient Encounter Medications as of 07/16/2022  Medication Sig   albuterol (PROVENTIL) (2.5 MG/3ML) 0.083% nebulizer solution Take 3 mLs (2.5 mg total) by nebulization every 6 (six) hours as needed for wheezing or shortness of breath.   Carbidopa-Levodopa ER (SINEMET CR) 25-100 MG tablet controlled release Take 2 tablets at 9 AM,  Take 2 tablets at noon, Take  2 tablets at 3 PM,  Take 1 tablet at 6 PM and Take 2 tablets at bedtime   citalopram (CELEXA) 20 MG tablet Take 20 mg by mouth daily.   gabapentin (NEURONTIN) 300 MG capsule Take 1 capsule (300 mg total) by mouth 2 (two) times daily.   ipratropium (ATROVENT) 0.06 % nasal spray SPRAY 2 SPRAYS IN EACH NOSTRIL EVERY 6 HOURS AS NEEDED FOR RUNNY NOSE   levothyroxine (SYNTHROID) 112 MCG tablet Take 100 mcg by mouth daily.   LINZESS 290 MCG CAPS capsule Take 290 mcg by mouth daily as needed.   metoprolol succinate (TOPROL-XL) 25 MG 24 hr tablet TAKE 1/2 TABLET BY MOUTH  ONCE DAILY (HOLD IF TOP BLOOD PRESSURE NUMBER LESS THAN 100 MMHG OR PULSE LESS THAN 60 BPM)   mometasone-formoterol (DULERA) 200-5 MCG/ACT AERO Inhale 2 puffs into the lungs in the morning and at bedtime.   montelukast (SINGULAIR) 10 MG tablet Take 1 tablet (10 mg total) by mouth daily.   Multiple Vitamin (MULTIVITAMIN WITH MINERALS) TABS tablet Take 2 tablets by mouth daily.   pantoprazole (PROTONIX) 40 MG tablet Take 1 tablet (40 mg total) by mouth 2 (two) times daily.   potassium chloride SA (KLOR-CON) 20 MEQ tablet Take 1 tablet by mouth daily at 12 noon.    pravastatin (PRAVACHOL) 40 MG tablet Take 1 tablet (40 mg total) by mouth daily at 6 PM.   predniSONE (DELTASONE) 20 MG tablet    revefenacin (YUPELRI) 175 MCG/3ML nebulizer solution Take 3 mLs (175 mcg total) by nebulization daily.   sacubitril-valsartan (ENTRESTO) 24-26 MG Take 1 tablet by mouth 2 (two) times daily.   Tiotropium Bromide Monohydrate (SPIRIVA RESPIMAT) 1.25 MCG/ACT AERS Inhale 2 puffs into the lungs daily.   Tiotropium Bromide Monohydrate (SPIRIVA RESPIMAT) 1.25 MCG/ACT AERS Inhale 2 puffs into the lungs daily.   VOLTAREN 1 % GEL Apply 1 application  topically 4 (four) times daily as needed (pain).   No facility-administered encounter medications on file as of 07/16/2022.    Objective:   PHYSICAL EXAMINATION:    VITALS:   There were no vitals filed for this visit.     Wt Readings from Last 3 Encounters:  03/25/22 175 lb 6.4 oz (79.6 kg)  02/18/22 178 lb 6.4 oz (80.9 kg)  01/24/22 174 lb (78.9 kg)     GEN:  The patient appears stated age and is in NAD. HEENT:  Normocephalic, atraumatic.  The mucous membranes are moist.   Neurological examination:  Orientation: The patient is alert and oriented x3.  She had no problem relating her history. Cranial nerves: There is good facial symmetry with min facial hypomimia. The speech is fluent and clear. Soft palate rises symmetrically and there is no tongue deviation. Hearing is intact to conversational tone. Sensation: Sensation is intact to light touch throughout Motor: Strength is at least antigravity x4.  She is holding the R leg (hamstring) b/c of cramping  Movement examination: Tone: There is nl tone in the UE/LE Abnormal movements: there is tremor, primarily in the R leg today.  She has tremor in the left hand. Coordination:  There is mild decremation with RAM's Gait and Station: She ambulates fairly well with a walker in the hallway.   I have reviewed and interpreted the following labs independently     Chemistry      Component Value Date/Time   NA 140 12/18/2021 2344   NA 139 05/24/2020 1631   K 4.3 12/18/2021 2344   CL 104 12/18/2021 2344   CO2 24 12/18/2021 2344   BUN 19 12/18/2021 2344   BUN 22 05/24/2020 1631   CREATININE 1.10 (H) 12/18/2021 2344      Component Value Date/Time   CALCIUM 9.3 12/18/2021 2344   ALKPHOS 78 12/18/2021 2344   AST 16 12/18/2021 2344   ALT <5 12/18/2021 2344   BILITOT 0.6 12/18/2021 2344       Lab Results  Component Value Date   WBC 5.7 12/18/2021   HGB 11.8 (L) 12/18/2021   HCT 35.0 (L) 12/18/2021   MCV 89.5 12/18/2021   PLT 288 12/18/2021    Lab Results  Component Value Date   TSH  1.862 02/18/2020    Total time spent on today's visit was *** minutes, including both face-to-face time and nonface-to-face time.  Time included that spent on review of records (prior notes available to me/labs/imaging if pertinent), discussing treatment and goals, answering patient's questions and coordinating care.  Cc:  Ginger Organ., MD

## 2022-07-16 ENCOUNTER — Other Ambulatory Visit: Payer: Self-pay

## 2022-07-16 ENCOUNTER — Other Ambulatory Visit (INDEPENDENT_AMBULATORY_CARE_PROVIDER_SITE_OTHER): Payer: Medicare Other

## 2022-07-16 ENCOUNTER — Encounter: Payer: Self-pay | Admitting: Neurology

## 2022-07-16 ENCOUNTER — Ambulatory Visit: Payer: Medicare Other | Admitting: Neurology

## 2022-07-16 VITALS — BP 118/72 | HR 72 | Ht 66.0 in | Wt 177.0 lb

## 2022-07-16 DIAGNOSIS — G903 Multi-system degeneration of the autonomic nervous system: Secondary | ICD-10-CM

## 2022-07-16 DIAGNOSIS — Z5181 Encounter for therapeutic drug level monitoring: Secondary | ICD-10-CM

## 2022-07-16 DIAGNOSIS — R296 Repeated falls: Secondary | ICD-10-CM

## 2022-07-16 DIAGNOSIS — R440 Auditory hallucinations: Secondary | ICD-10-CM | POA: Diagnosis not present

## 2022-07-16 LAB — COMPREHENSIVE METABOLIC PANEL
ALT: 2 U/L (ref 0–35)
AST: 16 U/L (ref 0–37)
Albumin: 4.1 g/dL (ref 3.5–5.2)
Alkaline Phosphatase: 71 U/L (ref 39–117)
BUN: 18 mg/dL (ref 6–23)
CO2: 30 mEq/L (ref 19–32)
Calcium: 9.4 mg/dL (ref 8.4–10.5)
Chloride: 102 mEq/L (ref 96–112)
Creatinine, Ser: 0.97 mg/dL (ref 0.40–1.20)
GFR: 58.36 mL/min — ABNORMAL LOW (ref >60.00)
Glucose, Bld: 100 mg/dL — ABNORMAL HIGH (ref 70–99)
Potassium: 4 mEq/L (ref 3.5–5.1)
Sodium: 138 mEq/L (ref 135–145)
Total Bilirubin: 0.6 mg/dL (ref 0.2–1.2)
Total Protein: 6.5 g/dL (ref 6.0–8.3)

## 2022-07-16 LAB — URINALYSIS, ROUTINE W REFLEX MICROSCOPIC
Bilirubin Urine: NEGATIVE
Hgb urine dipstick: NEGATIVE
Nitrite: NEGATIVE
Specific Gravity, Urine: 1.025 (ref 1.000–1.030)
Total Protein, Urine: 30 — AB
Urine Glucose: NEGATIVE
Urobilinogen, UA: 0.2 (ref 0.0–1.0)
pH: 5.5 (ref 5.0–8.0)

## 2022-07-16 LAB — CBC
HCT: 34.7 % — ABNORMAL LOW (ref 36.0–46.0)
Hemoglobin: 11.7 g/dL — ABNORMAL LOW (ref 12.0–15.0)
MCHC: 33.8 g/dL (ref 30.0–36.0)
MCV: 91.2 fl (ref 78.0–100.0)
Platelets: 265 10*3/uL (ref 150.0–400.0)
RBC: 3.8 Mil/uL — ABNORMAL LOW (ref 3.87–5.11)
RDW: 14.6 % (ref 11.5–15.5)
WBC: 5.9 10*3/uL (ref 4.0–10.5)

## 2022-07-16 NOTE — Patient Instructions (Signed)
Hold gabapentin Your provider has requested that you have labwork completed today. The lab is located on the Second floor at [place], within the [facility] office. When you get off the elevator, turn right and go in the [facility] [address]; the first brown door on the left.  Tell the ladies behind the desk that you are there for lab work. If you are not called within 15 minutes please check with the front desk.   Once you complete your labs you are free to go. You will receive a call or message via MyChart with your lab results.

## 2022-07-18 ENCOUNTER — Other Ambulatory Visit: Payer: Self-pay | Admitting: Internal Medicine

## 2022-07-18 DIAGNOSIS — J4541 Moderate persistent asthma with (acute) exacerbation: Secondary | ICD-10-CM

## 2022-07-19 LAB — UA/M W/RFLX CULTURE, ROUTINE
Bilirubin, UA: NEGATIVE
Glucose, UA: NEGATIVE
Nitrite, UA: NEGATIVE
RBC, UA: NEGATIVE
Specific Gravity, UA: 1.023 (ref 1.005–1.030)
Urobilinogen, Ur: 1 mg/dL (ref 0.2–1.0)
pH, UA: 5.5 (ref 5.0–7.5)

## 2022-07-19 LAB — MICROSCOPIC EXAMINATION
Bacteria, UA: NONE SEEN
Casts: NONE SEEN /lpf
RBC, Urine: NONE SEEN /hpf (ref 0–2)

## 2022-07-19 LAB — URINE CULTURE, REFLEX

## 2022-07-19 LAB — SPECIMEN STATUS REPORT

## 2022-08-08 ENCOUNTER — Ambulatory Visit: Payer: Medicare Other | Admitting: Neurology

## 2022-08-13 DIAGNOSIS — K080 Exfoliation of teeth due to systemic causes: Secondary | ICD-10-CM | POA: Diagnosis not present

## 2022-08-16 DIAGNOSIS — K080 Exfoliation of teeth due to systemic causes: Secondary | ICD-10-CM | POA: Diagnosis not present

## 2022-08-22 ENCOUNTER — Ambulatory Visit: Payer: Medicare Other | Admitting: Cardiology

## 2022-08-22 ENCOUNTER — Encounter: Payer: Self-pay | Admitting: Cardiology

## 2022-08-22 VITALS — BP 137/70 | HR 60 | Resp 17 | Ht 66.0 in | Wt 173.4 lb

## 2022-08-22 DIAGNOSIS — I5032 Chronic diastolic (congestive) heart failure: Secondary | ICD-10-CM | POA: Diagnosis not present

## 2022-08-22 DIAGNOSIS — E782 Mixed hyperlipidemia: Secondary | ICD-10-CM

## 2022-08-22 DIAGNOSIS — I428 Other cardiomyopathies: Secondary | ICD-10-CM

## 2022-08-22 DIAGNOSIS — I130 Hypertensive heart and chronic kidney disease with heart failure and stage 1 through stage 4 chronic kidney disease, or unspecified chronic kidney disease: Secondary | ICD-10-CM | POA: Diagnosis not present

## 2022-08-22 DIAGNOSIS — N183 Chronic kidney disease, stage 3 unspecified: Secondary | ICD-10-CM

## 2022-08-22 NOTE — Progress Notes (Signed)
[name] Date of Birth: 01/02/1951 MRN: [id number] Primary Care Provider:Shaw, Emily Filbert., MD Primary Cardiologist: Rex Kras, DO, [facility] (established care 03/10/2020)  Date: 08/22/22 Last Office Visit: 02/18/2022  Chief Complaint  Patient presents with   Cardiomyopathy   Follow-up    6 months    HPI  Rachel [name] is a 72 y.o.  female whose past medical history and cardiovascular risk factors include: Recovered nonischemic cardiomyopathy, Chronic HFpEF, multiple system atrophy, asthma, history of left lower extremity DVT, hypertension, hyperlipidemia, hypothyroidism, left bundle branch block, advanced age, postmenopausal female.  Patient is accompanied by her husband [name] at today's office visit.  Patient has history of nonischemic cardiomyopathy which recovered after up titration of GDMT and follows the practice given her chronic HFpEF.  She presents today for 7-month follow-up visit.  Patient denies any anginal discomfort or heart failure symptoms.  Clinically looks better compared to 6 months ago.  She has also lost 5 pounds she is congratulated on her efforts.  No hospitalizations for cardiovascular reasons.   ALLERGIES: Allergies  Allergen Reactions   Amantadine Hcl     Other reaction(s): Hallucinations   Ezetimibe     Other reaction(s): disoriented/confusion   Symbicort [Budesonide-Formoterol Fumarate] Other (See Comments)    Shakes and muscle weakness   Fosamax [Alendronate] Other (See Comments)    headache   Miacalcin [Calcitonin]     Other reaction(s): headache   Azithromycin     Other reaction(s): thrush   Crestor [Rosuvastatin]     Other reaction(s): confusion   Ezetimibe-Simvastatin     Other reaction(s): myalgias    MEDICATION LIST PRIOR TO VISIT: Current Outpatient Medications on File Prior to Visit  Medication Sig Dispense Refill   albuterol (PROVENTIL) (2.5 MG/3ML) 0.083% nebulizer solution Take 3 mLs (2.5 mg total) by nebulization every 6  (six) hours as needed for wheezing or shortness of breath. 120 mL 11   Carbidopa-Levodopa ER (SINEMET CR) 25-100 MG tablet controlled release Take 2 tablets at 9 AM,  Take 2 tablets at noon, Take  2 tablets at 3 PM,  Take 1 tablet at 6 PM and Take 2 tablets at bedtime 270 tablet 5   citalopram (CELEXA) 20 MG tablet Take 20 mg by mouth daily.     ipratropium (ATROVENT) 0.06 % nasal spray SPRAY 2 SPRAYS IN EACH NOSTRIL EVERY 6 HOURS AS NEEDED FOR RUNNY NOSE 45 mL 1   levothyroxine (SYNTHROID) 112 MCG tablet Take 100 mcg by mouth daily.     LINZESS 145 MCG CAPS capsule 1 capsule at least 30 minutes before the first meal of the day on an empty stomach Orally Once a day for 30 days     metoprolol succinate (TOPROL-XL) 25 MG 24 hr tablet TAKE 1/2 TABLET BY MOUTH ONCE DAILY (HOLD IF TOP BLOOD PRESSURE NUMBER LESS THAN 100 MMHG OR PULSE LESS THAN 60 BPM) 15 tablet 5   mometasone-formoterol (DULERA) 200-5 MCG/ACT AERO Inhale 2 puffs into the lungs in the morning and at bedtime. 1 each 5   montelukast (SINGULAIR) 10 MG tablet TAKE 1 TABLET BY MOUTH DAILY AT BEDTIME 30 tablet 8   Multiple Vitamin (MULTIVITAMIN WITH MINERALS) TABS tablet Take 2 tablets by mouth daily.     pantoprazole (PROTONIX) 40 MG tablet Take 1 tablet (40 mg total) by mouth 2 (two) times daily. 180 tablet 1   potassium chloride SA (KLOR-CON) 20 MEQ tablet Take 1 tablet by mouth daily at 12 noon.     pravastatin (  PRAVACHOL) 40 MG tablet Take 1 tablet (40 mg total) by mouth daily at 6 PM. 30 tablet 0   predniSONE (DELTASONE) 20 MG tablet      revefenacin (YUPELRI) 175 MCG/3ML nebulizer solution Take 3 mLs (175 mcg total) by nebulization daily. 90 mL 3   sacubitril-valsartan (ENTRESTO) 24-26 MG Take 1 tablet by mouth 2 (two) times daily. 180 tablet 3   Tiotropium Bromide Monohydrate (SPIRIVA RESPIMAT) 1.25 MCG/ACT AERS Inhale 2 puffs into the lungs daily. 1 each 2   VOLTAREN 1 % GEL Apply 1 application  topically 4 (four) times daily as needed  (pain).     No current facility-administered medications on file prior to visit.    PAST MEDICAL HISTORY: Past Medical History:  Diagnosis Date   Allergic rhinitis    Anemia    Arthritis    Asthma    Blood clot in vein  age 2   left leg   Depression    GERD (gastroesophageal reflux disease)    Hyperlipidemia    Hypertension    pt denies, in physician record   Hypothyroidism    Kidney stones    Multiple pregnancy loss, not currently pregnant    multiple miscarriages-pt.unsure of #   Multiple system atrophy ([place]) 2015   Nephrolithiasis    hx of kidney stones, last 6 months ago   Osteopenia    Osteoporosis    Parkinsonism    Tendinitis    left foot   Vitamin D deficiency     PAST SURGICAL HISTORY: Past Surgical History:  Procedure Laterality Date   BREAST EXCISIONAL BIOPSY Right    BREAST SURGERY     rt. lumpectomy, benign   colonscopy  2011   cystoscopy  2012   JOINT REPLACEMENT  05-2007   right knee replacment/revision   KIDNEY STONE SURGERY     LEFT HEART CATH AND CORONARY ANGIOGRAPHY N/A 02/21/2020   Procedure: LEFT HEART CATH AND CORONARY ANGIOGRAPHY;  Surgeon: Adrian Prows, MD;  Location: [place] [facility];  Service: Cardiovascular;  Laterality: N/A;   REPLACEMENT TOTAL KNEE Left 06/05/12   TOTAL KNEE REVISION  06/03/2012   Procedure: TOTAL KNEE REVISION;  Surgeon: Gearlean Alf, MD;  Location: [facility];  Service: Orthopedics;  Laterality: Left;  Revision of a Left Uni Knee to a Total Knee Arthroplasty    FAMILY HISTORY: The patient's family history includes Asthma in her sister; Breast cancer in her sister; Diabetes in her mother; Heart attack in her brother; Heart failure in her father, mother, and sister; Thyroid disease in her mother.   SOCIAL HISTORY:  The patient  reports that she has never smoked. She has never used smokeless tobacco. She reports that she does not drink alcohol and does not use drugs.  Review of Systems  Constitutional: Negative for  chills and fever.  HENT:  Negative for hoarse voice and nosebleeds.   Eyes:  Negative for discharge, double vision and pain.  Cardiovascular:  Negative for chest pain, claudication, dyspnea on exertion, leg swelling, near-syncope, orthopnea, palpitations, paroxysmal nocturnal dyspnea and syncope.  Respiratory:  Positive for shortness of breath (chronic and stable). Negative for hemoptysis.   Musculoskeletal:  Negative for muscle cramps and myalgias.  Gastrointestinal:  Negative for abdominal pain, constipation, diarrhea, hematemesis, hematochezia, melena, nausea and vomiting.  Neurological:  Positive for dizziness. Negative for light-headedness and numbness.    PHYSICAL EXAM:    08/22/2022    2:48 PM 07/16/2022    8:47 AM 03/25/2022  3:39 PM  Vitals with BMI  Height 5\' 6"  5\' 6"  5\' 6"   Weight 173 lbs 6 oz 177 lbs 175 lbs 6 oz  BMI 28 10.62 69.48  Systolic 546 270 350  Diastolic 70 72 68  Pulse 60 72 68    Physical Exam  Constitutional: No distress. She appears chronically ill.  Age appropriate, hemodynamically stable, presents in a wheelchair,  Neck: No JVD present.  Cardiovascular: Normal rate, regular rhythm, S1 normal, S2 normal, intact distal pulses and normal pulses. Exam reveals no gallop, no S3 and no S4.  No murmur heard. Pulmonary/Chest: Effort normal and breath sounds normal. No stridor. She has no wheezes. She has no rales.  Abdominal: Soft. Bowel sounds are normal. She exhibits no distension. There is no abdominal tenderness.  Musculoskeletal:        General: No edema.     Cervical back: Neck supple.  Neurological: She is alert and oriented to person, place, and time. She has intact cranial nerves (2-12).  Skin: Skin is warm and moist.   CARDIAC DATABASE: EKG: 08/22/2022: Sinus bradycardia, 55 bpm, normal axis, consider old anteroseptal infarct, without underlying injury pattern.   Echocardiogram: 05/26/2020:  Normal LV systolic function with visual EF 50-55%.  Left ventricle cavity is normal in size. Normal global wall motion. Doppler evidence of grade I (impaired) diastolic dysfunction, normal LAP.  Left atrial cavity is mildly dilated.  Mild (Grade I) mitral regurgitation.  Mild to moderate tricuspid regurgitation. No evidence of pulmonary  hypertension. RVSP measures 33 mmHg.  Mild pulmonic regurgitation.  IVC is dilated with a respiratory response of >50%.  Compared to prior study dated 02/17/2020 LVEF has improved from 30-35% to 50-55%, Now grade I diastolic dysfunction, otherwise no other significant change.  Stress Testing:  NA  Heart Catheterization: Left Heart Catheterization 02/21/20:  There is severe left ventricular systolic dysfunction.  The left ventricular ejection fraction is less than 25% by visual estimate. LV end diastolic pressure is normal. Normal coronary arteries, right dominant circulation.  LABORATORY DATA:    Latest Ref Rng & Units 07/16/2022    9:23 AM 12/18/2021   11:44 PM 01/08/2021    5:04 PM  CBC  WBC 4.0 - 10.5 K/uL 5.9  5.7  7.4   Hemoglobin 12.0 - 15.0 g/dL 11.7  11.8  12.0   Hematocrit 36.0 - 46.0 % 34.7  35.0  37.1   Platelets 150.0 - 400.0 K/uL 265.0  288  355        Latest Ref Rng & Units 07/16/2022    9:23 AM 12/18/2021   11:44 PM 07/16/2021    5:18 PM  CMP  Glucose 70 - 99 mg/dL 100  85    BUN 6 - 23 mg/dL 18  19    Creatinine 0.40 - 1.20 mg/dL 0.97  1.10  1.40   Sodium 135 - 145 mEq/L 138  140    Potassium 3.5 - 5.1 mEq/L 4.0  4.3    Chloride 96 - 112 mEq/L 102  104    CO2 19 - 32 mEq/L 30  24    Calcium 8.4 - 10.5 mg/dL 9.4  9.3    Total Protein 6.0 - 8.3 g/dL 6.5  6.7    Total Bilirubin 0.2 - 1.2 mg/dL 0.6  0.6    Alkaline Phos 39 - 117 U/L 71  78    AST 0 - 37 U/L 16  16    ALT 0 - 35 U/L 2  <5  Lipid Panel     Component Value Date/Time   CHOL 236 (H) 02/18/2020 0602   TRIG 47 02/18/2020 0602   HDL 65 02/18/2020 0602   CHOLHDL 3.6 02/18/2020 0602   VLDL 9 02/18/2020 0602    LDLCALC 162 (H) 02/18/2020 0602    No results found for: "HGBA1C" No components found for: "NTPROBNP" Lab Results  Component Value Date   TSH 1.862 02/18/2020    Cardiac Panel (last 3 results) No results for input(s): "CKTOTAL", "CKMB", "TROPONINIHS", "RELINDX" in the last 72 hours.  External Labs: Collected: June 09, 2020 available in Care Everywhere. Total cholesterol 155, triglycerides 97, HDL 65, LDL 71, non-HDL 90. Apolipoprotein B 95 (upper limit of normal 90). TSH 2.49. Free T4 1.3.  Hemoglobin 12.7 g/dL, hematocrit 39.2%. AST 16, ALT 6, alkaline phosphatase 104. Sodium 135, potassium 3.6, chloride 98, bicarb 25. BUN 15, creatinine 1.4  External Labs: Collected: October 19, 2021 PCP. BUN 26, creatinine 1.2. eGFR 44.3. Sodium 139, potassium 4.5, chloride 102, bicarb 32. AST 16, ALT 6, alkaline phosphatase 106. Hemoglobin 12.3 g/dL, hematocrit 36.1%. TSH 0.27 (reference range 0.40-4.2). Free T4 1.4 (within normal limits   IMPRESSION:    ICD-10-CM   1. Chronic heart failure with preserved ejection fraction (HFpEF) (HCC)  I50.32 EKG 12-Lead    PCV ECHOCARDIOGRAM COMPLETE    2. Recovered nonischemic cardiomyopathy (HCC)  I42.8 EKG 12-Lead    PCV ECHOCARDIOGRAM COMPLETE    3. Mixed hyperlipidemia  E78.2     4. Hypertensive heart and kidney disease with HF and with CKD stage III (HCC)  I13.0    N18.30        RECOMMENDATIONS: Autumm Hattery is a 72 y.o. female whose past medical history and cardiovascular risk factors include: Recovered nonischemic cardiomyopathy, Chronic HFpEF, multiple system atrophy, asthma, history of left lower extremity DVT, hypertension, hyperlipidemia, hypothyroidism, left bundle branch block, advanced age, postmenopausal female.  Chronic heart failure with preserved ejection fraction (HFpEF) (HCC) Recovered nonischemic cardiomyopathy (HCC) Stage C, NYHA class II. Euvolemic on physical examination. In the past uptitration of GDMT  has been difficult due to soft blood pressures and risk of falls. She has lost 5 pounds since last office visit, congratulated on her efforts. Strict I's and O's, daily weights. Prior echocardiogram noted LVEF of 30-35% and most recent LVEF 50-55%. Ischemic work-up: July 2021 left heart catheterization Echo prior to next visit.  No changes warranted at this time.  Mixed hyperlipidemia Currently on pravastatin. Does not endorse myalgias.  Hypertensive heart and kidney disease with HF and with CKD stage III ([place]) Office blood pressures are well controlled. Continue current medical therapy. No changes warranted at this time.   FINAL MEDICATION LIST END OF ENCOUNTER: No orders of the defined types were placed in this encounter.    Medications Discontinued During This Encounter  Medication Reason   Tiotropium Bromide Monohydrate (SPIRIVA RESPIMAT) 1.25 MCG/ACT AERS Duplicate       Current Outpatient Medications:    albuterol (PROVENTIL) (2.5 MG/3ML) 0.083% nebulizer solution, Take 3 mLs (2.5 mg total) by nebulization every 6 (six) hours as needed for wheezing or shortness of breath., Disp: 120 mL, Rfl: 11   Carbidopa-Levodopa ER (SINEMET CR) 25-100 MG tablet controlled release, Take 2 tablets at 9 AM,  Take 2 tablets at noon, Take  2 tablets at 3 PM,  Take 1 tablet at 6 PM and Take 2 tablets at bedtime, Disp: 270 tablet, Rfl: 5   citalopram (CELEXA) 20 MG tablet, Take 20  mg by mouth daily., Disp: , Rfl:    ipratropium (ATROVENT) 0.06 % nasal spray, SPRAY 2 SPRAYS IN EACH NOSTRIL EVERY 6 HOURS AS NEEDED FOR RUNNY NOSE, Disp: 45 mL, Rfl: 1   levothyroxine (SYNTHROID) 112 MCG tablet, Take 100 mcg by mouth daily., Disp: , Rfl:    LINZESS 145 MCG CAPS capsule, 1 capsule at least 30 minutes before the first meal of the day on an empty stomach Orally Once a day for 30 days, Disp: , Rfl:    metoprolol succinate (TOPROL-XL) 25 MG 24 hr tablet, TAKE 1/2 TABLET BY MOUTH ONCE DAILY (HOLD IF TOP  BLOOD PRESSURE NUMBER LESS THAN 100 MMHG OR PULSE LESS THAN 60 BPM), Disp: 15 tablet, Rfl: 5   mometasone-formoterol (DULERA) 200-5 MCG/ACT AERO, Inhale 2 puffs into the lungs in the morning and at bedtime., Disp: 1 each, Rfl: 5   montelukast (SINGULAIR) 10 MG tablet, TAKE 1 TABLET BY MOUTH DAILY AT BEDTIME, Disp: 30 tablet, Rfl: 8   Multiple Vitamin (MULTIVITAMIN WITH MINERALS) TABS tablet, Take 2 tablets by mouth daily., Disp: , Rfl:    pantoprazole (PROTONIX) 40 MG tablet, Take 1 tablet (40 mg total) by mouth 2 (two) times daily., Disp: 180 tablet, Rfl: 1   potassium chloride SA (KLOR-CON) 20 MEQ tablet, Take 1 tablet by mouth daily at 12 noon., Disp: , Rfl:    pravastatin (PRAVACHOL) 40 MG tablet, Take 1 tablet (40 mg total) by mouth daily at 6 PM., Disp: 30 tablet, Rfl: 0   predniSONE (DELTASONE) 20 MG tablet, , Disp: , Rfl:    revefenacin (YUPELRI) 175 MCG/3ML nebulizer solution, Take 3 mLs (175 mcg total) by nebulization daily., Disp: 90 mL, Rfl: 3   sacubitril-valsartan (ENTRESTO) 24-26 MG, Take 1 tablet by mouth 2 (two) times daily., Disp: 180 tablet, Rfl: 3   Tiotropium Bromide Monohydrate (SPIRIVA RESPIMAT) 1.25 MCG/ACT AERS, Inhale 2 puffs into the lungs daily., Disp: 1 each, Rfl: 2   VOLTAREN 1 % GEL, Apply 1 application  topically 4 (four) times daily as needed (pain)., Disp: , Rfl:   Orders Placed This Encounter  Procedures   EKG 12-Lead   PCV ECHOCARDIOGRAM COMPLETE    --Continue cardiac medications as reconciled in final medication list. --Return in about 6 months (around 02/20/2023) for Follow up HFpEF. Or sooner if needed. --Continue follow-up with your primary care physician regarding the management of your other chronic comorbid conditions.  Patient's questions and concerns were addressed to her satisfaction. She voices understanding of the instructions provided during this encounter.   This note was created using a voice recognition software as a result there may be  grammatical errors inadvertently enclosed that do not reflect the nature of this encounter. Every attempt is made to correct such errors.   Rex Kras, [place], [facility]  Pager: [phone] Office: [phone]

## 2022-09-03 ENCOUNTER — Other Ambulatory Visit: Payer: Self-pay | Admitting: Internal Medicine

## 2022-09-03 DIAGNOSIS — E039 Hypothyroidism, unspecified: Secondary | ICD-10-CM | POA: Diagnosis not present

## 2022-09-03 DIAGNOSIS — M545 Low back pain, unspecified: Secondary | ICD-10-CM | POA: Diagnosis not present

## 2022-09-03 DIAGNOSIS — R1032 Left lower quadrant pain: Secondary | ICD-10-CM | POA: Diagnosis not present

## 2022-09-03 DIAGNOSIS — L282 Other prurigo: Secondary | ICD-10-CM | POA: Diagnosis not present

## 2022-09-03 DIAGNOSIS — E559 Vitamin D deficiency, unspecified: Secondary | ICD-10-CM | POA: Diagnosis not present

## 2022-09-03 DIAGNOSIS — N390 Urinary tract infection, site not specified: Secondary | ICD-10-CM | POA: Diagnosis not present

## 2022-09-03 DIAGNOSIS — I10 Essential (primary) hypertension: Secondary | ICD-10-CM | POA: Diagnosis not present

## 2022-09-03 DIAGNOSIS — E785 Hyperlipidemia, unspecified: Secondary | ICD-10-CM | POA: Diagnosis not present

## 2022-09-03 DIAGNOSIS — K5900 Constipation, unspecified: Secondary | ICD-10-CM | POA: Diagnosis not present

## 2022-09-03 DIAGNOSIS — J4541 Moderate persistent asthma with (acute) exacerbation: Secondary | ICD-10-CM

## 2022-09-04 ENCOUNTER — Emergency Department (HOSPITAL_BASED_OUTPATIENT_CLINIC_OR_DEPARTMENT_OTHER)
Admission: EM | Admit: 2022-09-04 | Discharge: 2022-09-04 | Disposition: A | Discharge status: Home / Self Care | Payer: Medicare Other | Attending: Emergency Medicine | Admitting: Emergency Medicine

## 2022-09-04 ENCOUNTER — Emergency Department (HOSPITAL_BASED_OUTPATIENT_CLINIC_OR_DEPARTMENT_OTHER): Payer: Medicare Other

## 2022-09-04 ENCOUNTER — Other Ambulatory Visit: Payer: Self-pay

## 2022-09-04 DIAGNOSIS — Z79899 Other long term (current) drug therapy: Secondary | ICD-10-CM | POA: Diagnosis not present

## 2022-09-04 DIAGNOSIS — K5730 Diverticulosis of large intestine without perforation or abscess without bleeding: Secondary | ICD-10-CM | POA: Diagnosis not present

## 2022-09-04 DIAGNOSIS — K5792 Diverticulitis of intestine, part unspecified, without perforation or abscess without bleeding: Secondary | ICD-10-CM

## 2022-09-04 DIAGNOSIS — B029 Zoster without complications: Secondary | ICD-10-CM

## 2022-09-04 DIAGNOSIS — I10 Essential (primary) hypertension: Secondary | ICD-10-CM | POA: Diagnosis not present

## 2022-09-04 DIAGNOSIS — E039 Hypothyroidism, unspecified: Secondary | ICD-10-CM | POA: Diagnosis not present

## 2022-09-04 DIAGNOSIS — R109 Unspecified abdominal pain: Secondary | ICD-10-CM | POA: Diagnosis not present

## 2022-09-04 DIAGNOSIS — K6389 Other specified diseases of intestine: Secondary | ICD-10-CM | POA: Diagnosis not present

## 2022-09-04 DIAGNOSIS — K5732 Diverticulitis of large intestine without perforation or abscess without bleeding: Secondary | ICD-10-CM | POA: Insufficient documentation

## 2022-09-04 LAB — CBC WITH DIFFERENTIAL/PLATELET
Abs Immature Granulocytes: 0.01 10*3/uL (ref 0.00–0.07)
Basophils Absolute: 0 10*3/uL (ref 0.0–0.1)
Basophils Relative: 0 %
Eosinophils Absolute: 0.1 10*3/uL (ref 0.0–0.5)
Eosinophils Relative: 2 %
HCT: 36.6 % (ref 36.0–46.0)
Hemoglobin: 11.9 g/dL — ABNORMAL LOW (ref 12.0–15.0)
Immature Granulocytes: 0 %
Lymphocytes Relative: 17 %
Lymphs Abs: 1 10*3/uL (ref 0.7–4.0)
MCH: 30.1 pg (ref 26.0–34.0)
MCHC: 32.5 g/dL (ref 30.0–36.0)
MCV: 92.4 fL (ref 80.0–100.0)
Monocytes Absolute: 0.8 10*3/uL (ref 0.1–1.0)
Monocytes Relative: 13 %
Neutro Abs: 4.2 10*3/uL (ref 1.7–7.7)
Neutrophils Relative %: 68 %
Platelets: 347 10*3/uL (ref 150–400)
RBC: 3.96 MIL/uL (ref 3.87–5.11)
RDW: 12.9 % (ref 11.5–15.5)
WBC: 6.2 10*3/uL (ref 4.0–10.5)
nRBC: 0 % (ref 0.0–0.2)

## 2022-09-04 LAB — COMPREHENSIVE METABOLIC PANEL
ALT: <5 U/L (ref 0–44)
AST: 15 U/L (ref 15–41)
Albumin: 4 g/dL (ref 3.5–5.0)
Alkaline Phosphatase: 58 U/L (ref 38–126)
Anion gap: 11 (ref 5–15)
BUN: 15 mg/dL (ref 8–23)
CO2: 27 mmol/L (ref 22–32)
Calcium: 9.4 mg/dL (ref 8.9–10.3)
Chloride: 102 mmol/L (ref 98–111)
Creatinine, Ser: 0.82 mg/dL (ref 0.44–1.00)
GFR, Estimated: >60 mL/min (ref >60)
Glucose, Bld: 86 mg/dL (ref 70–99)
Potassium: 3.5 mmol/L (ref 3.5–5.1)
Sodium: 140 mmol/L (ref 135–145)
Total Bilirubin: 0.6 mg/dL (ref 0.3–1.2)
Total Protein: 6.6 g/dL (ref 6.5–8.1)

## 2022-09-04 LAB — URINALYSIS, ROUTINE W REFLEX MICROSCOPIC
Bilirubin Urine: NEGATIVE
Glucose, UA: NEGATIVE mg/dL
Hgb urine dipstick: NEGATIVE
Ketones, ur: 15 mg/dL — AB
Leukocytes,Ua: NEGATIVE
Nitrite: NEGATIVE
Specific Gravity, Urine: >1.046 — ABNORMAL HIGH (ref 1.005–1.030)
pH: 6 (ref 5.0–8.0)

## 2022-09-04 LAB — LACTIC ACID, PLASMA: Lactic Acid, Venous: 1.1 mmol/L (ref 0.5–1.9)

## 2022-09-04 MED ORDER — OXYCODONE-ACETAMINOPHEN 5-325 MG PO TABS: 1.0000 | ORAL_TABLET | Freq: Three times a day (TID) | ORAL | 0 refills | Status: DC | PRN

## 2022-09-04 MED ORDER — IOHEXOL 300 MG/ML  SOLN
100.0000 mL | Freq: Once | INTRAMUSCULAR | Status: AC | PRN
  Administered 2022-09-04: 85 mL via INTRAVENOUS

## 2022-09-04 MED ORDER — MORPHINE SULFATE (PF) 4 MG/ML IV SOLN
4.0000 mg | Freq: Once | INTRAVENOUS | Status: AC
  Administered 2022-09-04: 4 mg via INTRAVENOUS
  Filled 2022-09-04: qty 1

## 2022-09-04 MED ORDER — LACTATED RINGERS IV BOLUS
500.0000 mL | Freq: Once | INTRAVENOUS | Status: AC
  Administered 2022-09-04: 500 mL via INTRAVENOUS

## 2022-09-04 MED ORDER — OXYCODONE-ACETAMINOPHEN 5-325 MG PO TABS
1.0000 | ORAL_TABLET | Freq: Once | ORAL | Status: AC
  Administered 2022-09-04: 1 via ORAL
  Filled 2022-09-04: qty 1

## 2022-09-04 MED ORDER — METOCLOPRAMIDE HCL 5 MG/ML IJ SOLN
10.0000 mg | Freq: Once | INTRAMUSCULAR | Status: AC
  Administered 2022-09-04: 10 mg via INTRAVENOUS
  Filled 2022-09-04: qty 2

## 2022-09-04 NOTE — ED Provider Notes (Signed)
[facility] Provider Note   CSN: [id number] Arrival date & time: 09/04/22  1340     History  Chief Complaint  Patient presents with   Abdominal Pain    Rachel [name] is a 72 y.o. female.  Patient is a 72 year old female with a history of MSA, hypertension, anemia, hypothyroidism, chronic constipation who is presenting today with worsening left-sided abdominal pain.  Patient reports her last bowel movement was 1-1/2 to 2 weeks ago and was very small.  3 days ago is when she started having worsening left-sided abdominal pain.  She saw her PCP for this yesterday because she was having decreased oral intake because every time she would try to eat she became nauseated.  At her PCPs office she was diagnosed with shingles.  She was started on tramadol, Valtrex but had recently been taken off of gabapentin due to side effects.  She had been also put on Augmentin due to potential for a UTI.  She reports today the pain is just no better and she cannot tolerate it.  When she called her doctor back they reported to come here for further evaluation.  Patient has not had fever that she is aware of but does report dysuria intermittently.  The history is provided by the patient.  Abdominal Pain      Home Medications Prior to Admission medications   Medication Sig Start Date End Date Taking? Authorizing Provider  oxyCODONE-acetaminophen (PERCOCET/ROXICET) 5-325 MG tablet Take 1 tablet by mouth every 8 (eight) hours as needed for severe pain. 09/04/22  Yes Blanchie Dessert, MD  albuterol (PROVENTIL) (2.5 MG/3ML) 0.083% nebulizer solution Take 3 mLs (2.5 mg total) by nebulization every 6 (six) hours as needed for wheezing or shortness of breath. 02/07/20   Magdalen Spatz, NP  amoxicillin-clavulanate (AUGMENTIN) 875-125 MG tablet Take 1 tablet by mouth 2 (two) times daily. 09/03/22   [provider]  Carbidopa-Levodopa ER (SINEMET CR) 25-100 MG tablet  controlled release Take 2 tablets at 9 AM,  Take 2 tablets at noon, Take  2 tablets at 3 PM,  Take 1 tablet at 6 PM and Take 2 tablets at bedtime 04/26/22   Tat, Eustace Quail, DO  citalopram (CELEXA) 20 MG tablet Take 20 mg by mouth daily. 12/13/19   [provider]  furosemide (LASIX) 20 MG tablet Take 20 mg by mouth daily. 08/13/22   [provider]  ipratropium (ATROVENT) 0.06 % nasal spray SPRAY 2 SPRAYS IN EACH NOSTRIL EVERY 6 HOURS AS NEEDED FOR RUNNY NOSE 06/21/20   Kozlow, Donnamarie Poag, MD  levothyroxine (SYNTHROID) 100 MCG tablet Take 100 mcg by mouth daily. 08/13/22   [provider]  levothyroxine (SYNTHROID) 112 MCG tablet Take 100 mcg by mouth daily. 11/09/19   [provider]  LINZESS 145 MCG CAPS capsule 1 capsule at least 30 minutes before the first meal of the day on an empty stomach Orally Once a day for 30 days    [provider]  metoprolol succinate (TOPROL-XL) 25 MG 24 hr tablet TAKE 1/2 TABLET BY MOUTH ONCE DAILY (HOLD IF TOP BLOOD PRESSURE NUMBER LESS THAN 100 MMHG OR PULSE LESS THAN 60 BPM) 04/26/22   Tolia, Sunit, DO  mometasone-formoterol (DULERA) 200-5 MCG/ACT AERO INHALE 2 PUFFS BY MOUTH IN THE MORNING AND AT BEDTIME 09/03/22   Spero Geralds, MD  montelukast (SINGULAIR) 10 MG tablet TAKE 1 TABLET BY MOUTH DAILY AT BEDTIME 07/18/22   Spero Geralds, MD  Multiple Vitamin (MULTIVITAMIN WITH MINERALS) TABS tablet Take 2 tablets by mouth daily.    [provider]  ondansetron (ZOFRAN-ODT) 4 MG disintegrating tablet Take 4 mg by mouth every 8 (eight) hours as needed. 09/03/22   [provider]  pantoprazole (PROTONIX) 40 MG tablet Take 1 tablet (40 mg total) by mouth 2 (two) times daily. 06/21/20   Kozlow, Donnamarie Poag, MD  potassium chloride SA (KLOR-CON) 20 MEQ tablet Take 1 tablet by mouth daily at 12 noon.    [provider]  pravastatin (PRAVACHOL) 40 MG tablet Take 1 tablet (40 mg total) by mouth daily at 6 PM. 02/21/20    Amin, Jeanella Flattery, MD  predniSONE (DELTASONE) 20 MG tablet  01/08/22   [provider]  revefenacin (YUPELRI) 175 MCG/3ML nebulizer solution Take 3 mLs (175 mcg total) by nebulization daily. 04/02/22   Spero Geralds, MD  sacubitril-valsartan (ENTRESTO) 24-26 MG Take 1 tablet by mouth 2 (two) times daily. 07/10/22   Tolia, Sunit, DO  Tiotropium Bromide Monohydrate (SPIRIVA RESPIMAT) 1.25 MCG/ACT AERS Inhale 2 puffs into the lungs daily. 12/20/21   Spero Geralds, MD  valACYclovir (VALTREX) 1000 MG tablet Take 1,000 mg by mouth 3 (three) times daily. 09/03/22   [provider]  VOLTAREN 1 % GEL Apply 1 application  topically 4 (four) times daily as needed (pain). 01/25/14   [provider]      Allergies    Amantadine hcl, Ezetimibe, Symbicort [budesonide-formoterol fumarate], Fosamax [alendronate], Miacalcin [calcitonin], Azithromycin, Crestor [rosuvastatin], and Ezetimibe-simvastatin    Review of Systems   Review of Systems  Gastrointestinal:  Positive for abdominal pain.    Physical Exam Updated Vital Signs BP (!) 140/70 (BP Location: Right Arm)   Pulse 78   Temp 98.3 F (36.8 C) (Oral)   Resp 16   SpO2 95%  Physical Exam Vitals and nursing note reviewed.  Constitutional:      General: She is not in acute distress.    Appearance: She is well-developed.     Comments: Appears uncomfortable  HENT:     Head: Normocephalic and atraumatic.  Eyes:     Pupils: Pupils are equal, round, and reactive to light.  Cardiovascular:     Rate and Rhythm: Normal rate and regular rhythm.     Heart sounds: Normal heart sounds. No murmur heard.    No friction rub.  Pulmonary:     Effort: Pulmonary effort is normal.     Breath sounds: Normal breath sounds. No wheezing or rales.  Abdominal:     General: Bowel sounds are normal. There is no distension.     Palpations: Abdomen is soft.     Tenderness: There is abdominal tenderness in the suprapubic area and left lower  quadrant. There is guarding. There is no rebound.  Musculoskeletal:        General: No tenderness. Normal range of motion.     Cervical back: Normal range of motion and neck supple.     Right lower leg: No edema.     Left lower leg: No edema.     Comments: No edema  Skin:    General: Skin is warm and dry.     Findings: Rash present.     Comments: Patchy dermatomal rash with raised red papules present in the T10/11 dermatome  Neurological:     Mental Status: She is alert and oriented to person, place, and time.     Cranial Nerves: No cranial nerve deficit.  Psychiatric:        Behavior: Behavior normal.     ED Results / Procedures / Treatments   Labs (all labs ordered are listed, but only abnormal results are displayed) Labs Reviewed  CBC WITH DIFFERENTIAL/PLATELET - Abnormal; Notable for the following components:      Result Value   Hemoglobin 11.9 (*)    All other components within normal limits  URINALYSIS, ROUTINE W REFLEX MICROSCOPIC - Abnormal; Notable for the following components:   Specific Gravity, Urine >1.046 (*)    Ketones, ur 15 (*)    Protein, ur TRACE (*)    All other components within normal limits  LACTIC ACID, PLASMA  COMPREHENSIVE METABOLIC PANEL  LACTIC ACID, PLASMA    EKG None  Radiology CT ABDOMEN PELVIS W CONTRAST  Result Date: 09/04/2022 CLINICAL DATA:  Left lower quadrant abdominal pain. Chronic constipation. Recent diagnosis of urinary tract infection and shingles. History of renal calculi. EXAM: CT ABDOMEN AND PELVIS WITH CONTRAST TECHNIQUE: Multidetector CT imaging of the abdomen and pelvis was performed using the standard protocol following bolus administration of intravenous contrast. RADIATION DOSE REDUCTION: This exam was performed according to the departmental dose-optimization program which includes automated exposure control, adjustment of the mA and/or kV according to patient size and/or use of iterative reconstruction technique. CONTRAST:   69mL OMNIPAQUE IOHEXOL 300 MG/ML  SOLN COMPARISON:  12/19/2021 FINDINGS: Lower chest: Mild linear subsegmental atelectasis or scarring in the left lower lobe and right middle lobe. Hepatobiliary: Unremarkable Pancreas: Unremarkable Spleen: Unremarkable Adrenals/Urinary Tract: 1.1 by 1.7 cm homogeneous hypodense lesion of the left kidney upper pole, internal density 13 Hounsfield units, compatible with a benign cyst. This warrants no further imaging workup. No hydronephrosis or hydroureter. No definite urinary tract calculi are currently identified. Adrenal glands normal. Stomach/Bowel: Descending and sigmoid colon diverticulosis. Suspected mild acute diverticulitis at the junction of the descending and sigmoid colon, with stranding in the surrounding mesentery as on image 58 of series 5. Mild wall thickening in the descending and proximal sigmoid colon may be due to low-grade inflammation and/or nondistention. There is some localized fluid-filled small bowel loops with air-levels in the left lower quadrant, not overtly dilated, potentially reflecting a local ileus. Other mild scattered air-fluid levels in small bowel noted. Normal appendix. Vascular/Lymphatic: Atherosclerosis is present, including aortoiliac atherosclerotic disease. Mild atheromatous plaque dorsally at the origin of the SMA and celiac trunk, not causing substantial stenosis or occlusion. No pathologic adenopathy observed. Reproductive: Unremarkable Other: No supplemental non-categorized findings. Musculoskeletal: Transitional S1. Loss of disc height at L3-4 with broad Schmorl's node along the superior endplate of L4. Right greater than left foraminal impingement at L5-S1 due to facet and intervertebral spurring. IMPRESSION: 1. Descending and sigmoid colon diverticulosis with suspected mild acute diverticulitis at the junction of the descending and sigmoid colon. No extraluminal gas or abscess. 2. Mild wall thickening in the descending and proximal  sigmoid colon may be due to low-grade inflammation and/or nondistention. 3. Scattered air-fluid levels in small bowel, not overtly dilated, probably reflecting a mild ileus. 4. Right greater than left foraminal impingement at L5-S1 due to facet and intervertebral spurring. Transitional S1. 5. Aortic atherosclerosis. Aortic Atherosclerosis (ICD10-I70.0). Electronically Signed   By: Van Clines M.D.   On: 09/04/2022 17:06    Procedures Procedures    Medications Ordered in ED Medications  morphine (PF) 4 MG/ML injection 4 mg (4 mg Intravenous Given 09/04/22 1606)  metoCLOPramide (REGLAN) injection 10 mg (10 mg Intravenous Given 09/04/22 1606)  lactated ringers bolus 500 mL (0 mLs Intravenous Stopped 09/04/22 1843)  iohexol (OMNIPAQUE) 300 MG/ML solution 100 mL (85 mLs Intravenous Contrast Given 09/04/22 1638)  oxyCODONE-acetaminophen (PERCOCET/ROXICET) 5-325 MG per tablet 1 tablet (1 tablet Oral Given 09/04/22 1952)    ED Course/ Medical Decision Making/ A&P                             Medical Decision Making Amount and/or Complexity of Data Reviewed Independent Historian: spouse External Data Reviewed: notes. Labs: ordered. Decision-making details documented in ED Course. Radiology: ordered and independent interpretation performed. Decision-making details documented in ED Course.  Risk Prescription drug management. Parenteral controlled substances.   Pt with multiple medical problems and comorbidities and presenting today with a complaint that caries a high risk for morbidity and mortality.  Here today due to worsening abdominal pain.  Patient does have shingles which might be the sole cause of her abdominal pain but she is also not had a bowel movement in over a week and a half despite taking Linzess.  She is very nauseated when she attempts to eat and is not eating.  She does appear mildly dehydrated.  She has a history of heart failure with a last EF of 50 to 55%.  She is compliant with  her medications.  She has started medications from her doctor yesterday but the tramadol is not helping with her pain.  Patient cannot be on gabapentin at this time because she was having bad side effects.  Concern for obstruction, diverticulitis, mass, UTI versus functional constipation from her MSA.  Labs and CT pending  8:17 PM I independently operated patient's labs and EKG.  Findings were normal today.  CBC, UA, lactic acid, CMP are all within normal limits.  I have independently visualized and interpreted pt's images today.  He without evidence of kidney stones, hydronephrosis or free fluid.  Radiology reports descending and sigmoid colon diverticulosis with suspected mild acute diverticulitis at the junction and possible mild ileus.  After 1 dose of morphine patient reports her symptoms are significantly improved.  She is able to get up and ambulate without difficulty.  Feel that tramadol is just not strong enough pain medication for the shingles.  Patient was started on Augmentin yesterday which should help with the diverticulitis.  Findings discussed with the patient and her husband.  Will switch pain medications.  She will discontinue the tramadol.  Again she is not a candidate for gabapentin.  Discussed with the patient the importance of staying on her Linzess and a bowel regimen and limiting opiates as much as possible.  She was given return precautions.          Final Clinical Impression(s) / ED Diagnoses Final diagnoses:  Diverticulitis  Herpes zoster without complication    Rx / DC Orders ED Discharge Orders          Ordered    oxyCODONE-acetaminophen (PERCOCET/ROXICET) 5-325 MG tablet  Every 8 hours PRN        09/04/22 2015              Blanchie Dessert, MD 09/04/22 2019

## 2022-09-04 NOTE — Discharge Instructions (Signed)
The antibiotic that Dr. Brigitte Pulse gave you yesterday should treat the diverticulitis.  Stop taking the tramadol and you can start taking the new pain medication as needed.  However this will make constipation worse so use it sparingly.  If you start having high fevers, vomiting return to the emergency room.

## 2022-09-04 NOTE — ED Triage Notes (Signed)
Pt has chronic constipation ,Linzess is no longer working, she states she hasn't had a bm for 1.5 weeks, she was seen by her MD yesterday and dx with UTI and shingles which she is on medication now.

## 2022-09-10 DIAGNOSIS — I5022 Chronic systolic (congestive) heart failure: Secondary | ICD-10-CM | POA: Diagnosis not present

## 2022-09-10 DIAGNOSIS — Z1339 Encounter for screening examination for other mental health and behavioral disorders: Secondary | ICD-10-CM | POA: Diagnosis not present

## 2022-09-10 DIAGNOSIS — I130 Hypertensive heart and chronic kidney disease with heart failure and stage 1 through stage 4 chronic kidney disease, or unspecified chronic kidney disease: Secondary | ICD-10-CM | POA: Diagnosis not present

## 2022-09-10 DIAGNOSIS — Z0000 Encounter for general adult medical examination without abnormal findings: Secondary | ICD-10-CM | POA: Diagnosis not present

## 2022-09-10 DIAGNOSIS — F325 Major depressive disorder, single episode, in full remission: Secondary | ICD-10-CM | POA: Diagnosis not present

## 2022-09-10 DIAGNOSIS — Z1331 Encounter for screening for depression: Secondary | ICD-10-CM | POA: Diagnosis not present

## 2022-09-10 DIAGNOSIS — G903 Multi-system degeneration of the autonomic nervous system: Secondary | ICD-10-CM | POA: Diagnosis not present

## 2022-09-16 ENCOUNTER — Telehealth: Payer: Self-pay

## 2022-09-16 NOTE — Telephone Encounter (Signed)
        Patient  visited [facility] on 09/04/2022  for Abdominal Pain.   Telephone encounter attempt :  1st  Unable to leave message voicemail full.   [facility] Resource Care Guide   [email or web address]  ?? [phone]   Website: [email or web address]  [email or web address]

## 2022-09-16 NOTE — Telephone Encounter (Signed)
     Patient  visit on 09/04/2022  at [facility] was for abdominal pain.  Have you been able to follow up with your primary care physician? Yes  The patient was or was not able to obtain any needed medicine or equipment. Patient obtained medication.  Are there diet recommendations that you are having difficulty following? No  Patient expresses understanding of discharge instructions and education provided has no other needs at this time. Yes   [place] Resource Care Guide   [email or web address]  ?? [phone]   Website: [email or web address]  [email or web address]

## 2022-11-05 DIAGNOSIS — K080 Exfoliation of teeth due to systemic causes: Secondary | ICD-10-CM | POA: Diagnosis not present

## 2022-11-11 ENCOUNTER — Encounter: Payer: Self-pay | Admitting: Neurology

## 2022-11-19 DIAGNOSIS — H52203 Unspecified astigmatism, bilateral: Secondary | ICD-10-CM | POA: Diagnosis not present

## 2022-12-03 NOTE — Progress Notes (Unsigned)
Assessment/Plan:   1.  MSA  -discussed changing to rytary to see if less dyskinesia with it but they felt cost prohibitive  -Continue carbidopa/levodopa 25/100 CR, 2 tablets at 9 AM, 2 tablets at noon, 2 tablets at 3 PM, 1 tablet at 6 PM and 2 tablets at bedtime. Had discussed taking 1 po 9 x per day instead of above dosing to lessen dyskinesia but they said it would just be too difficult to dose this way  -she is not using walker now and says she feels better without it.  She had falls with rollator, which is not my favorite walker for those with MSA.  Consider U step in future but she doesn't want ambulatory assistive device right now. 2.  Diplopia  -Saw Dr. Daphine Deutscher years ago at [facility].  Diagnosis was congenital large intermittent exotropia/exophoria.  This is getting worse 3.  Dysphagia  -Last MBE in April, 2023.  No treatment was recommended.  Felt to be mild aspiration risk.  Mechanical soft diet, thin liquids with some regular solids as well recommended. 4.  Low BP  -pt on metoprolol and entresto.  Metoprolol has been decreased and she doesn't take it if BP too low.      Subjective:   Rachel [name] was seen today in follow up for MSA.  My previous records were reviewed prior to todays visit as well as outside records available to me.  Husband present and supplements history.  Last visit, we stopped her gabapentin to see if that would stop the hallucinations.  We did not hear from them after that visit, but they state today that they are gone now.  She was in the emergency room February 7 with complaints of abdominal pain.  She noted that she had been placed on Valtrex for shingles and started on tramadol as well.  She was also on Augmentin for a possible urinary tract infection.  The diagnosis was mild diverticulitis.  She was told to stop the tramadol and was given a short course of Percocet and discharged from the emergency room.  She followed up with Dr. Clelia Croft who started her on some  oxycodone for the shingles pain.  She states that appetite has been good.  Thinks that weight loss has been stabalized.  State no longer using a walker as they prayed over her and feel that God healed her and she can walk better without the walker.   Current prescribed movement disorder medications:  Carbidopa/levodopa 25/100 CR,  2 tablets at 9 AM, 2 tablets at noon, 2 tablets at 3 PM, 1 tablet at 6 PM and 2 tablets at bedtime   Previous medications: Carbidopa/levodopa 25/100 IR; amantadine (stopped because of memory change/hallucinations); gabapentin (some hallucinations at 300 mg twice daily) ALLERGIES:   Allergies  Allergen Reactions   Amantadine Hcl     Other reaction(s): Hallucinations   Ezetimibe     Other reaction(s): disoriented/confusion   Symbicort [Budesonide-Formoterol Fumarate] Other (See Comments)    Shakes and muscle weakness   Fosamax [Alendronate] Other (See Comments)    headache   Miacalcin [Calcitonin]     Other reaction(s): headache   Azithromycin     Other reaction(s): thrush   Crestor [Rosuvastatin]     Other reaction(s): confusion   Ezetimibe-Simvastatin     Other reaction(s): myalgias    CURRENT MEDICATIONS:  Outpatient Encounter Medications as of 12/04/2022  Medication Sig   albuterol (PROVENTIL) (2.5 MG/3ML) 0.083% nebulizer solution Take 3 mLs (2.5 mg total)  by nebulization every 6 (six) hours as needed for wheezing or shortness of breath.   amoxicillin-clavulanate (AUGMENTIN) 875-125 MG tablet Take 1 tablet by mouth 2 (two) times daily.   Carbidopa-Levodopa ER (SINEMET CR) 25-100 MG tablet controlled release Take 2 tablets at 9 AM,  Take 2 tablets at noon, Take  2 tablets at 3 PM,  Take 1 tablet at 6 PM and Take 2 tablets at bedtime   citalopram (CELEXA) 20 MG tablet Take 20 mg by mouth daily.   furosemide (LASIX) 20 MG tablet Take 20 mg by mouth daily.   levothyroxine (SYNTHROID) 100 MCG tablet Take 100 mcg by mouth daily.   levothyroxine (SYNTHROID)  112 MCG tablet Take 100 mcg by mouth daily.   LINZESS 145 MCG CAPS capsule 1 capsule at least 30 minutes before the first meal of the day on an empty stomach Orally Once a day for 30 days   metoprolol succinate (TOPROL-XL) 25 MG 24 hr tablet TAKE 1/2 TABLET BY MOUTH ONCE DAILY (HOLD IF TOP BLOOD PRESSURE NUMBER LESS THAN 100 MMHG OR PULSE LESS THAN 60 BPM)   mometasone-formoterol (DULERA) 200-5 MCG/ACT AERO INHALE 2 PUFFS BY MOUTH IN THE MORNING AND AT BEDTIME   montelukast (SINGULAIR) 10 MG tablet TAKE 1 TABLET BY MOUTH DAILY AT BEDTIME   ondansetron (ZOFRAN-ODT) 4 MG disintegrating tablet Take 4 mg by mouth every 8 (eight) hours as needed.   pantoprazole (PROTONIX) 40 MG tablet Take 1 tablet (40 mg total) by mouth 2 (two) times daily.   potassium chloride SA (KLOR-CON) 20 MEQ tablet Take 1 tablet by mouth daily at 12 noon.   pravastatin (PRAVACHOL) 40 MG tablet Take 1 tablet (40 mg total) by mouth daily at 6 PM.   predniSONE (DELTASONE) 20 MG tablet    revefenacin (YUPELRI) 175 MCG/3ML nebulizer solution Take 3 mLs (175 mcg total) by nebulization daily.   sacubitril-valsartan (ENTRESTO) 24-26 MG Take 1 tablet by mouth 2 (two) times daily.   Tiotropium Bromide Monohydrate (SPIRIVA RESPIMAT) 1.25 MCG/ACT AERS Inhale 2 puffs into the lungs daily.   valACYclovir (VALTREX) 1000 MG tablet Take 1,000 mg by mouth 3 (three) times daily.   VOLTAREN 1 % GEL Apply 1 application  topically 4 (four) times daily as needed (pain).   ipratropium (ATROVENT) 0.06 % nasal spray SPRAY 2 SPRAYS IN EACH NOSTRIL EVERY 6 HOURS AS NEEDED FOR RUNNY NOSE   Multiple Vitamin (MULTIVITAMIN WITH MINERALS) TABS tablet Take 2 tablets by mouth daily. (Patient not taking: Reported on 12/04/2022)   oxyCODONE-acetaminophen (PERCOCET/ROXICET) 5-325 MG tablet Take 1 tablet by mouth every 8 (eight) hours as needed for severe pain.   No facility-administered encounter medications on file as of 12/04/2022.    Objective:   PHYSICAL  EXAMINATION:    VITALS:   Vitals:   12/04/22 1454  BP: 122/76  Pulse: 68  SpO2: 98%  Weight: 151 lb 12.8 oz (68.9 kg)  Height: 5\' 6"  (1.676 m)    Wt Readings from Last 3 Encounters:  12/04/22 151 lb 12.8 oz (68.9 kg)  08/22/22 173 lb 6.4 oz (78.7 kg)  07/16/22 177 lb (80.3 kg)   GEN:  The patient appears stated age and is in NAD. HEENT:  Normocephalic, atraumatic.  The mucous membranes are moist.   Neurological examination:  Orientation: The patient is alert and oriented x3.  She had no problem relating her history. Cranial nerves: There is good facial symmetry with min facial hypomimia. There is exotropia on the R(with more  diplopia).  The speech is fluent and clear. Soft palate rises symmetrically and there is no tongue deviation. Hearing is intact to conversational tone. Sensation: Sensation is intact to light touch throughout Motor: Strength is at least antigravity x4.    Movement examination: Tone: There is nl tone in the UE/LE Abnormal movements: there is mod dyskinesia Coordination:  There is mild decremation with RAM's Gait and Station: She is a bit unsteady and ataxic (some due to dyskinesia)  I have reviewed and interpreted the following labs independently    Chemistry      Component Value Date/Time   NA 140 09/04/2022 1607   NA 139 05/24/2020 1631   K 3.5 09/04/2022 1607   CL 102 09/04/2022 1607   CO2 27 09/04/2022 1607   BUN 15 09/04/2022 1607   BUN 22 05/24/2020 1631   CREATININE 0.82 09/04/2022 1607      Component Value Date/Time   CALCIUM 9.4 09/04/2022 1607   ALKPHOS 58 09/04/2022 1607   AST 15 09/04/2022 1607   ALT <5 09/04/2022 1607   BILITOT 0.6 09/04/2022 1607       Lab Results  Component Value Date   WBC 6.2 09/04/2022   HGB 11.9 (L) 09/04/2022   HCT 36.6 09/04/2022   MCV 92.4 09/04/2022   PLT 347 09/04/2022    Lab Results  Component Value Date   TSH 1.862 02/18/2020   Total time spent on today's visit was 25 minutes,  including both face-to-face time and nonface-to-face time.  Time included that spent on review of records (prior notes available to me/labs/imaging if pertinent), discussing treatment and goals, answering patient's questions and coordinating care.   Cc:  Cleatis Polka., MD

## 2022-12-04 ENCOUNTER — Ambulatory Visit: Payer: Medicare Other | Admitting: Neurology

## 2022-12-04 ENCOUNTER — Encounter: Payer: Self-pay | Admitting: Neurology

## 2022-12-04 VITALS — BP 122/76 | HR 68 | Ht 66.0 in | Wt 151.8 lb

## 2022-12-04 DIAGNOSIS — G20B1 Parkinson's disease with dyskinesia, without mention of fluctuations: Secondary | ICD-10-CM

## 2022-12-04 DIAGNOSIS — G903 Multi-system degeneration of the autonomic nervous system: Secondary | ICD-10-CM

## 2022-12-04 DIAGNOSIS — G238 Other specified degenerative diseases of basal ganglia: Secondary | ICD-10-CM | POA: Diagnosis not present

## 2022-12-05 ENCOUNTER — Ambulatory Visit: Payer: Medicare Other | Admitting: Neurology

## 2022-12-09 ENCOUNTER — Other Ambulatory Visit: Payer: Self-pay | Admitting: Neurology

## 2022-12-09 ENCOUNTER — Other Ambulatory Visit: Payer: Self-pay | Admitting: Cardiology

## 2022-12-09 DIAGNOSIS — G238 Other specified degenerative diseases of basal ganglia: Secondary | ICD-10-CM

## 2022-12-09 DIAGNOSIS — I5032 Chronic diastolic (congestive) heart failure: Secondary | ICD-10-CM

## 2023-02-11 ENCOUNTER — Other Ambulatory Visit: Payer: Medicare Other

## 2023-02-20 ENCOUNTER — Ambulatory Visit: Payer: Medicare Other | Admitting: Cardiology

## 2023-03-07 ENCOUNTER — Ambulatory Visit: Payer: Medicare Other

## 2023-03-07 DIAGNOSIS — I5032 Chronic diastolic (congestive) heart failure: Secondary | ICD-10-CM

## 2023-03-07 DIAGNOSIS — I428 Other cardiomyopathies: Secondary | ICD-10-CM

## 2023-03-17 DIAGNOSIS — G903 Multi-system degeneration of the autonomic nervous system: Secondary | ICD-10-CM | POA: Diagnosis not present

## 2023-03-17 DIAGNOSIS — I130 Hypertensive heart and chronic kidney disease with heart failure and stage 1 through stage 4 chronic kidney disease, or unspecified chronic kidney disease: Secondary | ICD-10-CM | POA: Diagnosis not present

## 2023-03-18 ENCOUNTER — Encounter: Payer: Self-pay | Admitting: Cardiology

## 2023-03-18 ENCOUNTER — Ambulatory Visit: Payer: Medicare Other | Admitting: Cardiology

## 2023-03-18 VITALS — BP 123/60 | HR 81 | Resp 16 | Ht 66.0 in | Wt 153.0 lb

## 2023-03-18 DIAGNOSIS — Z86718 Personal history of other venous thrombosis and embolism: Secondary | ICD-10-CM

## 2023-03-18 DIAGNOSIS — I5032 Chronic diastolic (congestive) heart failure: Secondary | ICD-10-CM

## 2023-03-18 DIAGNOSIS — I130 Hypertensive heart and chronic kidney disease with heart failure and stage 1 through stage 4 chronic kidney disease, or unspecified chronic kidney disease: Secondary | ICD-10-CM | POA: Diagnosis not present

## 2023-03-18 DIAGNOSIS — E782 Mixed hyperlipidemia: Secondary | ICD-10-CM | POA: Diagnosis not present

## 2023-03-18 DIAGNOSIS — G232 Striatonigral degeneration: Secondary | ICD-10-CM

## 2023-03-18 DIAGNOSIS — N183 Chronic kidney disease, stage 3 unspecified: Secondary | ICD-10-CM

## 2023-03-18 MED ORDER — SACUBITRIL-VALSARTAN 24-26 MG PO TABS: 1.0000 | ORAL_TABLET | Freq: Two times a day (BID) | ORAL | 3 refills | Status: DC

## 2023-03-18 NOTE — Progress Notes (Signed)
Rachel [name] Date of Birth: 07/31/1950 MRN: [id number] Primary Care Provider:Shaw, Netta Corrigan., MD Primary Cardiologist: Tessa Lerner, DO, [facility] (established care 03/10/2020)  Date: 03/18/23 Last Office Visit: 08/22/2022  Chief Complaint  Patient presents with   Follow-up    Heart failure with improved EF.    HPI  Rachel [name] is a 73 y.o.  female whose past medical history and cardiovascular risk factors include: Heart failure with improved EF, Chronic HFpEF, multiple system atrophy, asthma, history of left lower extremity DVT, hypertension, hyperlipidemia, hypothyroidism, intermittent left bundle branch block, advanced age, postmenopausal female.  Patient is accompanied by her husband Rachel Needle at today's office visit.  Patient is being followed by the practice given her history of heart failure with improved EF/chronic HFpEF.  She presents today for 57-month follow-up visit.  Over the last 6 months she denies anginal chest pain or heart failure symptoms.  She is tolerating her current medical therapy without any significant side effects or intolerances.  No hospitalizations or urgent care visits for cardiovascular reasons.   ALLERGIES: Allergies  Allergen Reactions   Amantadine Hcl     Other reaction(s): Hallucinations   Ezetimibe     Other reaction(s): disoriented/confusion   Symbicort [Budesonide-Formoterol Fumarate] Other (See Comments)    Shakes and muscle weakness   Fosamax [Alendronate] Other (See Comments)    headache   Miacalcin [Calcitonin]     Other reaction(s): headache   Azithromycin     Other reaction(s): thrush   Crestor [Rosuvastatin]     Other reaction(s): confusion   Ezetimibe-Simvastatin     Other reaction(s): myalgias    MEDICATION LIST PRIOR TO VISIT: Current Outpatient Medications on File Prior to Visit  Medication Sig Dispense Refill   albuterol (PROVENTIL) (2.5 MG/3ML) 0.083% nebulizer solution Take 3 mLs (2.5 mg total) by nebulization every 6  (six) hours as needed for wheezing or shortness of breath. 120 mL 11   Carbidopa-Levodopa ER (SINEMET CR) 25-100 MG tablet controlled release TAKE 2 TABLETS BY MOUTH AT 9AM;TAKE 2 TABLETS BY MOUTH AT NOON;TAKE 2 TABLETS BY MOUTH AT 3PM;TAKE 1 TABLET BY MOUTH AT 6PM;TAKE 2 TABLETS BY MOUTH DAILY AT BEDTIME 810 tablet 0   citalopram (CELEXA) 20 MG tablet Take 20 mg by mouth daily.     ipratropium (ATROVENT) 0.06 % nasal spray SPRAY 2 SPRAYS IN EACH NOSTRIL EVERY 6 HOURS AS NEEDED FOR RUNNY NOSE 45 mL 1   LINZESS 145 MCG CAPS capsule 1 capsule at least 30 minutes before the first meal of the day on an empty stomach Orally Once a day for 30 days     metoprolol succinate (TOPROL-XL) 25 MG 24 hr tablet TAKE 1/2 TABLET BY MOUTH QD(HOLD IF BLOOD PRESSURE NUMBER LESS THAN 100 MMHG OR PULSE LESS THAN 60 BPM) 15 tablet 8   mometasone-formoterol (DULERA) 200-5 MCG/ACT AERO INHALE 2 PUFFS BY MOUTH IN THE MORNING AND AT BEDTIME 13 g 5   montelukast (SINGULAIR) 10 MG tablet TAKE 1 TABLET BY MOUTH DAILY AT BEDTIME 30 tablet 8   ondansetron (ZOFRAN-ODT) 4 MG disintegrating tablet Take 4 mg by mouth every 8 (eight) hours as needed.     pantoprazole (PROTONIX) 40 MG tablet Take 1 tablet (40 mg total) by mouth 2 (two) times daily. 180 tablet 1   potassium chloride SA (KLOR-CON) 20 MEQ tablet Take 1 tablet by mouth daily at 12 noon.     pravastatin (PRAVACHOL) 40 MG tablet Take 1 tablet (40 mg total) by mouth daily at  6 PM. 30 tablet 0   revefenacin (YUPELRI) 175 MCG/3ML nebulizer solution Take 3 mLs (175 mcg total) by nebulization daily. 90 mL 3   Tiotropium Bromide Monohydrate (SPIRIVA RESPIMAT) 1.25 MCG/ACT AERS Inhale 2 puffs into the lungs daily. 1 each 2   valACYclovir (VALTREX) 1000 MG tablet Take 1,000 mg by mouth 3 (three) times daily.     VOLTAREN 1 % GEL Apply 1 application  topically 4 (four) times daily as needed (pain).     levothyroxine (SYNTHROID) 100 MCG tablet Take 100 mcg by mouth daily.      levothyroxine (SYNTHROID) 112 MCG tablet Take 100 mcg by mouth daily.     No current facility-administered medications on file prior to visit.    PAST MEDICAL HISTORY: Past Medical History:  Diagnosis Date   Allergic rhinitis    Anemia    Arthritis    Asthma    Blood clot in vein  age 25   left leg   Depression    GERD (gastroesophageal reflux disease)    Hyperlipidemia    Hypertension    pt denies, in physician record   Hypothyroidism    Kidney stones    Multiple pregnancy loss, not currently pregnant    multiple miscarriages-pt.unsure of #   Multiple system atrophy (HCC) 2015   Nephrolithiasis    hx of kidney stones, last 6 months ago   Osteopenia    Osteoporosis    Parkinsonism    Tendinitis    left foot   Vitamin D deficiency     PAST SURGICAL HISTORY: Past Surgical History:  Procedure Laterality Date   BREAST EXCISIONAL BIOPSY Right    BREAST SURGERY     rt. lumpectomy, benign   colonscopy  2011   cystoscopy  2012   JOINT REPLACEMENT  05-2007   right knee replacment/revision   KIDNEY STONE SURGERY     LEFT HEART CATH AND CORONARY ANGIOGRAPHY N/A 02/21/2020   Procedure: LEFT HEART CATH AND CORONARY ANGIOGRAPHY;  Surgeon: Yates Decamp, MD;  Location: [facility];  Service: Cardiovascular;  Laterality: N/A;   REPLACEMENT TOTAL KNEE Left 06/05/12   TOTAL KNEE REVISION  06/03/2012   Procedure: TOTAL KNEE REVISION;  Surgeon: Loanne Drilling, MD;  Location: [facility];  Service: Orthopedics;  Laterality: Left;  Revision of a Left Uni Knee to a Total Knee Arthroplasty    FAMILY HISTORY: The patient's family history includes Asthma in her sister; Breast cancer in her sister; Diabetes in her mother; Heart attack in her brother; Heart failure in her father, mother, and sister; Thyroid disease in her mother.   SOCIAL HISTORY:  The patient  reports that she has never smoked. She has never used smokeless tobacco. She reports that she does not drink alcohol and does not  use drugs.  Review of Systems  Constitutional: Negative for chills and fever.  HENT:  Negative for hoarse voice and nosebleeds.   Eyes:  Negative for discharge, double vision and pain.  Cardiovascular:  Negative for chest pain, claudication, dyspnea on exertion, leg swelling, near-syncope, orthopnea, palpitations, paroxysmal nocturnal dyspnea and syncope.  Respiratory:  Positive for shortness of breath (chronic and stable). Negative for hemoptysis.   Musculoskeletal:  Negative for muscle cramps and myalgias.  Gastrointestinal:  Negative for abdominal pain, constipation, diarrhea, hematemesis, hematochezia, melena, nausea and vomiting.  Neurological:  Positive for dizziness. Negative for light-headedness and numbness.    PHYSICAL EXAM:    03/18/2023    2:33 PM 12/04/2022  2:54 PM 09/04/2022    8:30 PM  Vitals with BMI  Height 5\' 6"  5\' 6"    Weight 153 lbs 151 lbs 13 oz   BMI 24.71 24.51   Systolic 123 122 409  Diastolic 60 76 72  Pulse 81 68 78    Physical Exam  Constitutional: No distress. She appears chronically ill.  Age appropriate, hemodynamically stable, presents in a wheelchair,  Neck: No JVD present.  Cardiovascular: Normal rate, regular rhythm, S1 normal, S2 normal, intact distal pulses and normal pulses. Exam reveals no gallop, no S3 and no S4.  No murmur heard. Pulmonary/Chest: Effort normal and breath sounds normal. No stridor. She has no wheezes. She has no rales.  Abdominal: Soft. Bowel sounds are normal. She exhibits no distension. There is no abdominal tenderness.  Musculoskeletal:        General: No edema.     Cervical back: Neck supple.  Neurological: She is alert and oriented to person, place, and time. She has intact cranial nerves (2-12).  Skin: Skin is warm and moist.   CARDIAC DATABASE: EKG: 03/18/2023: NSR 68bpm, old anteroseptal infarct, PVCs, without underlying injury pattern.   Echocardiogram: 03/07/2023:  Normal LV systolic function with EF 59%.  Left ventricle cavity is normal in size. Normal left ventricular wall thickness. Normal global wall motion. Normal diastolic filling pattern, normal LAP. Calculated EF 59%.  Native tricuspid valve. No evidence of tricuspid stenosis. Mild tricuspid regurgitation. No evidence of pulmonary hypertension.  Compared to the study done on 05/26/2020, LVEF was low normal at 50 to 55%.  Valvular heart disease including mild to moderate TR and mild pulmonary regurgitation no longer present.   Compared to 02/17/2020, LVEF stays improved from 30 to 35% previously.   Stress Testing:  NA  Heart Catheterization: Left Heart Catheterization 02/21/20:  There is severe left ventricular systolic dysfunction.  The left ventricular ejection fraction is less than 25% by visual estimate. LV end diastolic pressure is normal. Normal coronary arteries, right dominant circulation.  LABORATORY DATA:    Latest Ref Rng & Units 09/04/2022    4:07 PM 07/16/2022    9:23 AM 12/18/2021   11:44 PM  CBC  WBC 4.0 - 10.5 K/uL 6.2  5.9  5.7   Hemoglobin 12.0 - 15.0 g/dL 81.1  91.4  78.2   Hematocrit 36.0 - 46.0 % 36.6  34.7  35.0   Platelets 150 - 400 K/uL 347  265.0  288        Latest Ref Rng & Units 09/04/2022    4:07 PM 07/16/2022    9:23 AM 12/18/2021   11:44 PM  CMP  Glucose 70 - 99 mg/dL 86  956  85   BUN 8 - 23 mg/dL 15  18  19    Creatinine 0.44 - 1.00 mg/dL 2.13  0.86  5.78   Sodium 135 - 145 mmol/L 140  138  140   Potassium 3.5 - 5.1 mmol/L 3.5  4.0  4.3   Chloride 98 - 111 mmol/L 102  102  104   CO2 22 - 32 mmol/L 27  30  24    Calcium 8.9 - 10.3 mg/dL 9.4  9.4  9.3   Total Protein 6.5 - 8.1 g/dL 6.6  6.5  6.7   Total Bilirubin 0.3 - 1.2 mg/dL 0.6  0.6  0.6   Alkaline Phos 38 - 126 U/L 58  71  78   AST 15 - 41 U/L 15  16  16  ALT 0 - 44 U/L <5  2  <5    Lipid Panel     Component Value Date/Time   CHOL 236 (H) 02/18/2020 0602   TRIG 47 02/18/2020 0602   HDL 65 02/18/2020 0602   CHOLHDL 3.6 02/18/2020  0602   VLDL 9 02/18/2020 0602   LDLCALC 162 (H) 02/18/2020 0602    No results found for: "HGBA1C" No components found for: "NTPROBNP" Lab Results  Component Value Date   TSH 1.862 02/18/2020    Cardiac Panel (last 3 results) No results for input(s): "CKTOTAL", "CKMB", "TROPONINIHS", "RELINDX" in the last 72 hours.  External Labs: Collected: June 09, 2020 available in Care Everywhere. Total cholesterol 155, triglycerides 97, HDL 65, LDL 71, non-HDL 90. Apolipoprotein B 95 (upper limit of normal 90). TSH 2.49. Free T4 1.3.  Hemoglobin 12.7 g/dL, hematocrit 40.9%. AST 16, ALT 6, alkaline phosphatase 104. Sodium 135, potassium 3.6, chloride 98, bicarb 25. BUN 15, creatinine 1.4  External Labs: Collected: October 19, 2021 PCP. BUN 26, creatinine 1.2. eGFR 44.3. Sodium 139, potassium 4.5, chloride 102, bicarb 32. AST 16, ALT 6, alkaline phosphatase 106. Hemoglobin 12.3 g/dL, hematocrit 81.1%. TSH 0.27 (reference range 0.40-4.2). Free T4 1.4 (within normal limits   IMPRESSION:    ICD-10-CM   1. Heart failure with improved ejection fraction (HFimpEF) (HCC)  I50.32 EKG 12-Lead    sacubitril-valsartan (ENTRESTO) 24-26 MG    2. Chronic heart failure with preserved ejection fraction (HFpEF) (HCC)  I50.32     3. Mixed hyperlipidemia  E78.2     4. Hypertensive heart and kidney disease with HF and with CKD stage III (HCC)  I13.0    N18.30     5. Hx of deep venous thrombosis  Z86.718     6. Multiple system atrophy, 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HCC)  G23.2        RECOMMENDATIONS: Rachel [name] is a 72 y.o. female whose past medical history and cardiovascular risk factors include: Heart failure with improved EF, Chronic HFpEF, multiple system atrophy, asthma, history of left lower extremity DVT, hypertension, hyperlipidemia, hypothyroidism, intermittent left bundle branch block, advanced age, postmenopausal female.  Heart failure with improved ejection fraction (HFimpEF)  (HCC) Chronic heart failure with preserved ejection fraction (HFpEF) (HCC) Stage C, NYHA class II. July 2021: LVEF 30-35%. October 2021: LVEF 50-55%, grade 1 diastolic dysfunction, see report for additional details August 2024: LVEF 59%, normal diastolic function, no significant valvular heart disease. Doing well on current medical therapy. Entresto refilled. Will hold off on up titration of medical therapy to prevent hypotension. Continue current management.  Mixed hyperlipidemia Continue pravastatin. Does not endorse myalgias. Currently managed by primary care provider.  Hypertensive heart and kidney disease with HF and with CKD stage III (HCC) Office blood pressures are well-controlled. Continue current medical therapy.  Multiple system atrophy, 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HCC) Currently on medical therapy. Follows with neurology.   FINAL MEDICATION LIST END OF ENCOUNTER: Meds ordered this encounter  Medications   sacubitril-valsartan (ENTRESTO) 24-26 MG    Sig: Take 1 tablet by mouth 2 (two) times daily.    Dispense:  180 tablet    Refill:  3     Current Outpatient Medications:    albuterol (PROVENTIL) (2.5 MG/3ML) 0.083% nebulizer solution, Take 3 mLs (2.5 mg total) by nebulization every 6 (six) hours as needed for wheezing or shortness of breath., Disp: 120 mL, Rfl: 11   Carbidopa-Levodopa ER (SINEMET CR) 25-100 MG tablet controlled release, TAKE 2 TABLETS BY MOUTH AT 9AM;TAKE  2 TABLETS BY MOUTH AT NOON;TAKE 2 TABLETS BY MOUTH AT 3PM;TAKE 1 TABLET BY MOUTH AT 6PM;TAKE 2 TABLETS BY MOUTH DAILY AT BEDTIME, Disp: 810 tablet, Rfl: 0   citalopram (CELEXA) 20 MG tablet, Take 20 mg by mouth daily., Disp: , Rfl:    ipratropium (ATROVENT) 0.06 % nasal spray, SPRAY 2 SPRAYS IN EACH NOSTRIL EVERY 6 HOURS AS NEEDED FOR RUNNY NOSE, Disp: 45 mL, Rfl: 1   LINZESS 145 MCG CAPS capsule, 1 capsule at least 30 minutes before the first meal of the day on an empty stomach Orally Once a day for 30  days, Disp: , Rfl:    metoprolol succinate (TOPROL-XL) 25 MG 24 hr tablet, TAKE 1/2 TABLET BY MOUTH QD(HOLD IF BLOOD PRESSURE NUMBER LESS THAN 100 MMHG OR PULSE LESS THAN 60 BPM), Disp: 15 tablet, Rfl: 8   mometasone-formoterol (DULERA) 200-5 MCG/ACT AERO, INHALE 2 PUFFS BY MOUTH IN THE MORNING AND AT BEDTIME, Disp: 13 g, Rfl: 5   montelukast (SINGULAIR) 10 MG tablet, TAKE 1 TABLET BY MOUTH DAILY AT BEDTIME, Disp: 30 tablet, Rfl: 8   ondansetron (ZOFRAN-ODT) 4 MG disintegrating tablet, Take 4 mg by mouth every 8 (eight) hours as needed., Disp: , Rfl:    pantoprazole (PROTONIX) 40 MG tablet, Take 1 tablet (40 mg total) by mouth 2 (two) times daily., Disp: 180 tablet, Rfl: 1   potassium chloride SA (KLOR-CON) 20 MEQ tablet, Take 1 tablet by mouth daily at 12 noon., Disp: , Rfl:    pravastatin (PRAVACHOL) 40 MG tablet, Take 1 tablet (40 mg total) by mouth daily at 6 PM., Disp: 30 tablet, Rfl: 0   revefenacin (YUPELRI) 175 MCG/3ML nebulizer solution, Take 3 mLs (175 mcg total) by nebulization daily., Disp: 90 mL, Rfl: 3   Tiotropium Bromide Monohydrate (SPIRIVA RESPIMAT) 1.25 MCG/ACT AERS, Inhale 2 puffs into the lungs daily., Disp: 1 each, Rfl: 2   valACYclovir (VALTREX) 1000 MG tablet, Take 1,000 mg by mouth 3 (three) times daily., Disp: , Rfl:    VOLTAREN 1 % GEL, Apply 1 application  topically 4 (four) times daily as needed (pain)., Disp: , Rfl:    levothyroxine (SYNTHROID) 100 MCG tablet, Take 100 mcg by mouth daily., Disp: , Rfl:    levothyroxine (SYNTHROID) 112 MCG tablet, Take 100 mcg by mouth daily., Disp: , Rfl:    sacubitril-valsartan (ENTRESTO) 24-26 MG, Take 1 tablet by mouth 2 (two) times daily., Disp: 180 tablet, Rfl: 3  Orders Placed This Encounter  Procedures   EKG 12-Lead    --Continue cardiac medications as reconciled in final medication list. --Return in about 6 months (around 09/18/2023) for Follow up HFimpEF. Or sooner if needed. --Continue follow-up with your primary care  physician regarding the management of your other chronic comorbid conditions.  Patient's questions and concerns were addressed to her satisfaction. She voices understanding of the instructions provided during this encounter.   This note was created using a voice recognition software as a result there may be grammatical errors inadvertently enclosed that do not reflect the nature of this encounter. Every attempt is made to correct such errors.  Tessa Lerner, [place], [facility]  Pager:  [phone] Office: [phone]

## 2023-03-26 DIAGNOSIS — M25512 Pain in left shoulder: Secondary | ICD-10-CM | POA: Diagnosis not present

## 2023-03-26 DIAGNOSIS — M25511 Pain in right shoulder: Secondary | ICD-10-CM | POA: Diagnosis not present

## 2023-04-16 ENCOUNTER — Other Ambulatory Visit: Payer: Self-pay | Admitting: Internal Medicine

## 2023-04-16 DIAGNOSIS — J4540 Moderate persistent asthma, uncomplicated: Secondary | ICD-10-CM

## 2023-05-13 ENCOUNTER — Telehealth: Payer: Self-pay | Admitting: Neurology

## 2023-05-13 NOTE — Telephone Encounter (Signed)
Patients husband called stating that the patient is having some issues. Patients leg is shaking 2/3 of the day and is wanting to be seen sooner, I added them to the wait list

## 2023-05-15 NOTE — Telephone Encounter (Signed)
Left message for patient husband to call back to make appt with Tat for 05-16-23 but they need to know it would be a short visit as it is procedure day

## 2023-05-15 NOTE — Telephone Encounter (Signed)
Called patients husband back and left a message for a call back.

## 2023-05-20 ENCOUNTER — Other Ambulatory Visit: Payer: Self-pay | Admitting: Internal Medicine

## 2023-05-20 ENCOUNTER — Other Ambulatory Visit: Payer: Self-pay | Admitting: Neurology

## 2023-05-20 DIAGNOSIS — K080 Exfoliation of teeth due to systemic causes: Secondary | ICD-10-CM | POA: Diagnosis not present

## 2023-05-20 DIAGNOSIS — G238 Other specified degenerative diseases of basal ganglia: Secondary | ICD-10-CM

## 2023-05-20 DIAGNOSIS — J4541 Moderate persistent asthma with (acute) exacerbation: Secondary | ICD-10-CM

## 2023-05-22 IMAGING — CT CT ANGIO CHEST
2 of 7 series · 17 of 46 positions shown · IV contrast (omnipaque)
Comparison: Chest CT dated 02/17/2020 and radiograph 01/08/2021.

CLINICAL DATA: Shortness of breath and cough.  Chest pain.

EXAM:
CT ANGIOGRAPHY CHEST WITH CONTRAST
TECHNIQUE: Multidetector CT imaging of the chest was performed using the
standard protocol during bolus administration of intravenous
contrast. Multiplanar CT image reconstructions and MIPs were
obtained to evaluate the vascular anatomy.
CONTRAST:  60mL OMNIPAQUE IOHEXOL 350 MG/ML SOLN

[Series 5: pe axial thins · axial · 0.75mm/px · z∈[+1078,+1346]mm · 14 of 310 slices shown]
[im 21/310  lung]
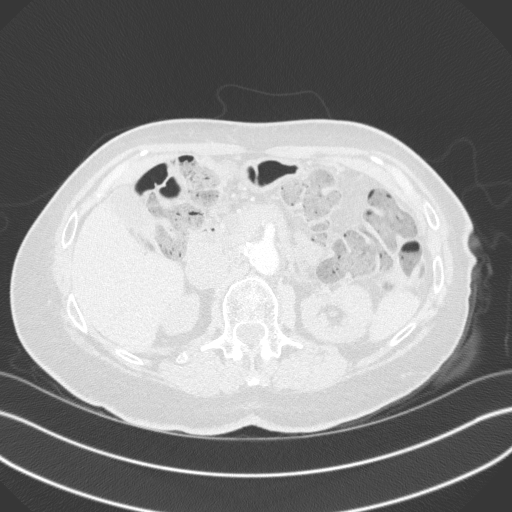
[im 42/310  soft-tissue]
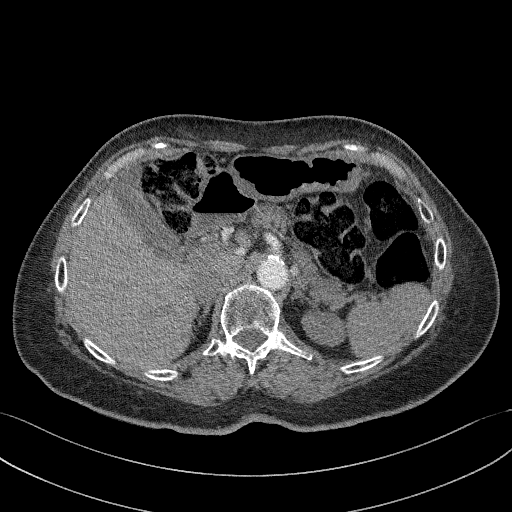
[im 62/310  lung]
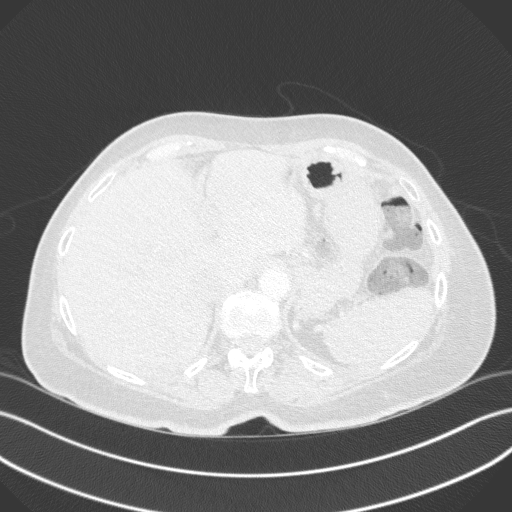
[im 83/310  soft-tissue]
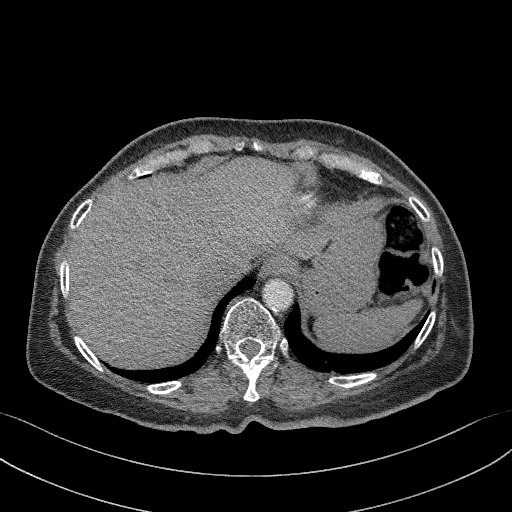
[im 104/310  lung]
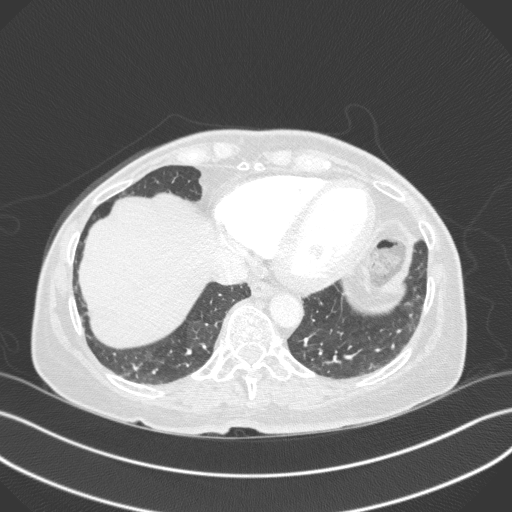
[im 124/310  soft-tissue]
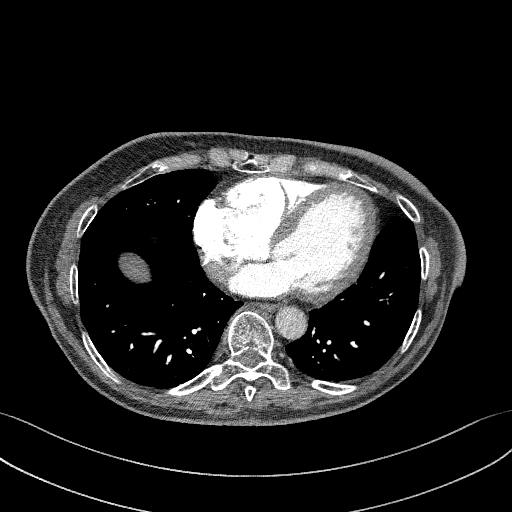
[im 145/310  lung]
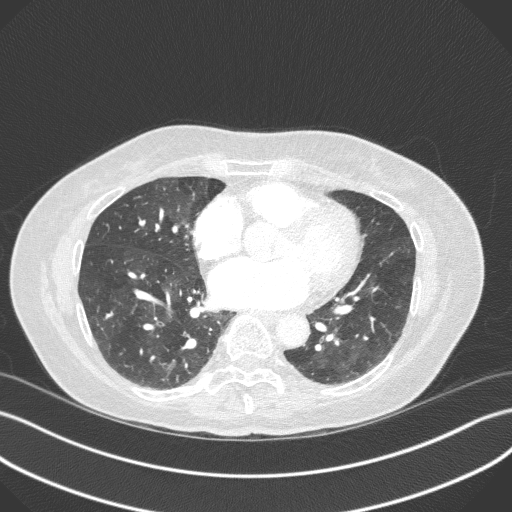
[im 165/310  soft-tissue]
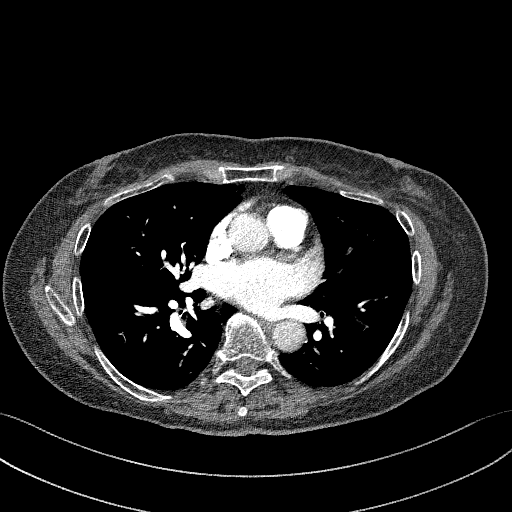
[im 186/310  lung]
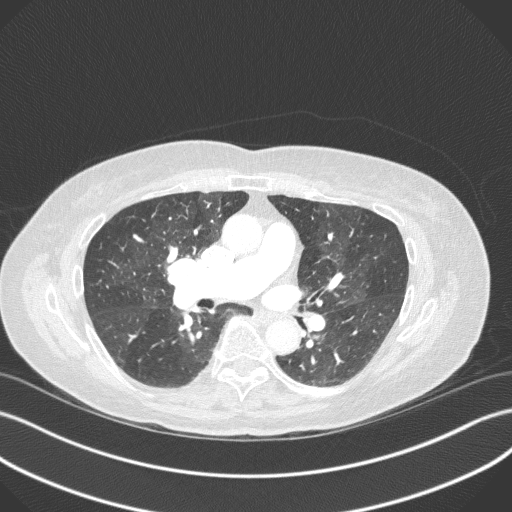
[im 207/310  soft-tissue]
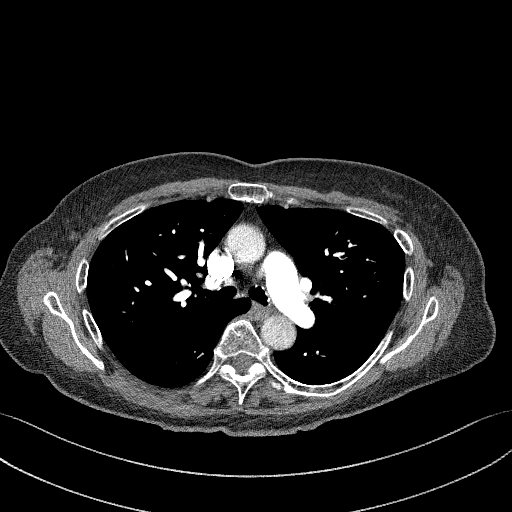
[im 227/310  lung]
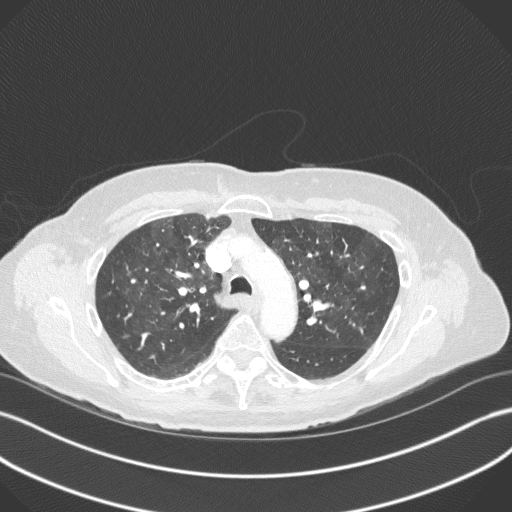
[im 248/310  soft-tissue]
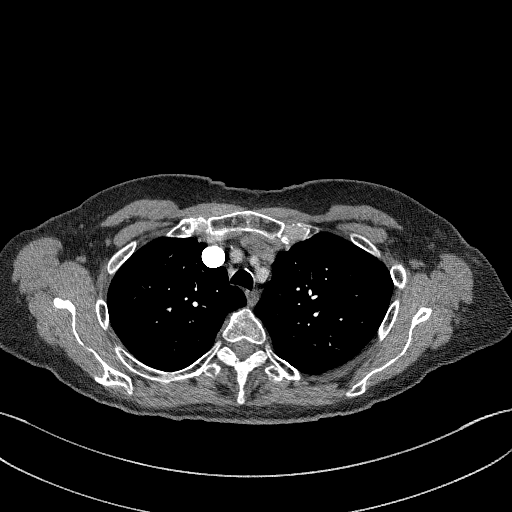
[im 268/310  lung]
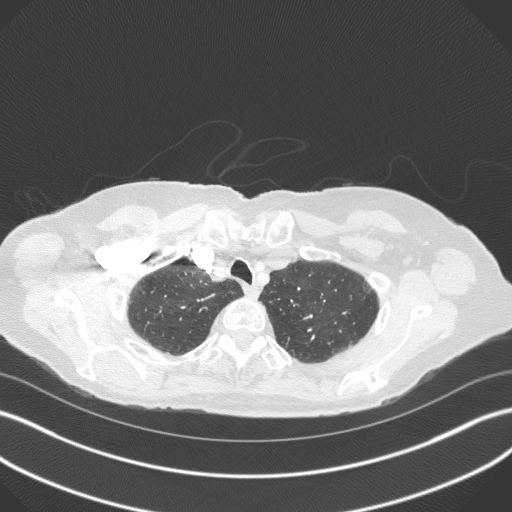
[im 289/310  soft-tissue]
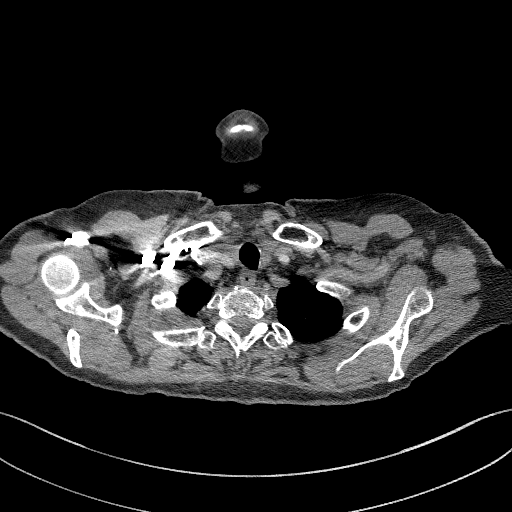

[Series 7: cor soft · coronal · 0.60mm/px · 3 of 123 slices shown]
[im 31/123  soft-tissue]
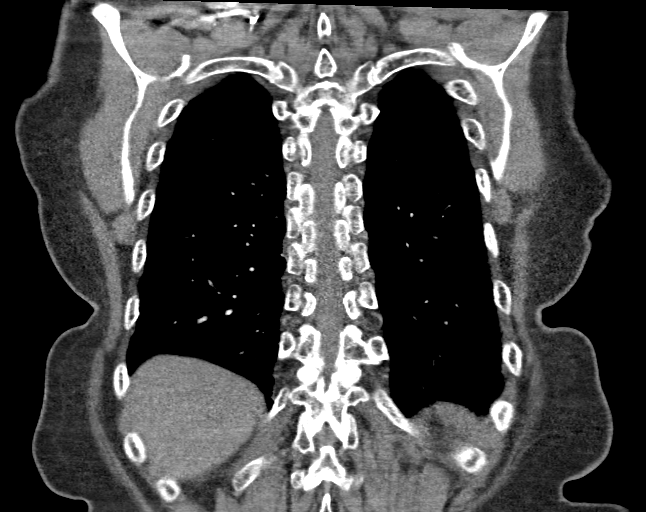
[im 62/123  soft-tissue]
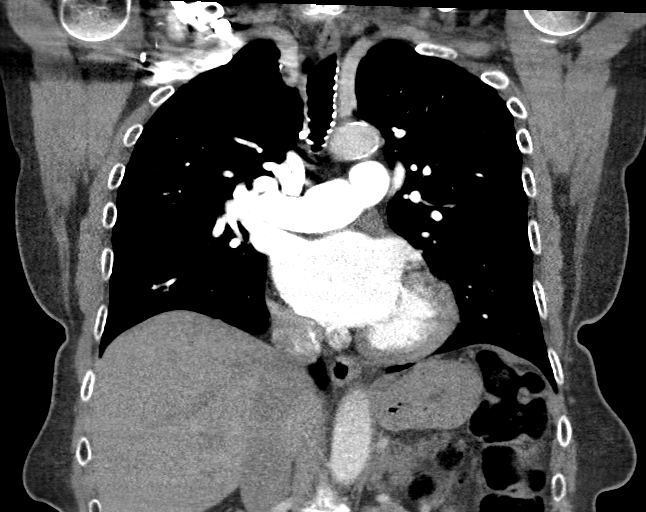
[im 92/123  soft-tissue]
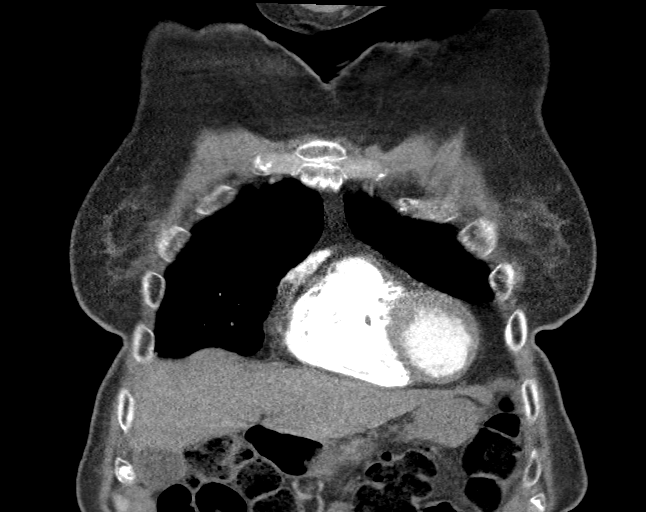

[17 of 46 positions shown; findings below may reference images not displayed]

FINDINGS: Cardiovascular: Top-normal cardiac size. No pericardial effusion.
Mild atherosclerotic calcification of the thoracic aorta. No
aneurysmal dilatation. No pulmonary artery embolus identified.

Mediastinum/Nodes: No hilar or mediastinal adenopathy. The esophagus
is grossly unremarkable. No mediastinal fluid collection.

Lungs/Pleura: There is mosaic attenuation of the lungs which may
represent reactive small airway versus small vessel disease. Small
calcified granuloma in the lingula. No focal consolidation, pleural
effusion, pneumothorax. The central airways are patent.

Upper Abdomen: No acute abnormality.

Musculoskeletal: No acute osseous pathology.

Review of the MIP images confirms the above findings.
IMPRESSION: 1. No acute intrathoracic pathology. No CT evidence of pulmonary
artery embolus.
2. Mosaic attenuation of the lungs may represent reactive small
airway versus small vessel disease.
3. Aortic Atherosclerosis ([id number]-A30.0).

## 2023-06-11 NOTE — Progress Notes (Unsigned)
Assessment/Plan:   1.  MSA  -stop carbidopa/levodopa 25/100 CR, 2 tablets at 9 AM, 2 tablets at noon, 2 tablets at 3 PM, 1 tablet at 6 PM and 2 tablets at bedtime.   -start rytary 195 mg, 1 at 9am, 1pm, 5pm, 9pm.  Call me in few weeks and let me know how doing. 2.  Diplopia  -Saw Dr. Daphine Deutscher years ago at [facility].  Diagnosis was congenital large intermittent exotropia/exophoria.  This is getting worse 3.  Dysphagia  -Last MBE in April, 2023.  No treatment was recommended.  Felt to be mild aspiration risk.  Mechanical soft diet, thin liquids with some regular solids as well recommended. 4.  Low BP  -pt on metoprolol and entresto.  Metoprolol has been decreased and she doesn't take it if BP too low.    5.  R vision change  -doesn't sound like amarosis fugax.  Will monitor and let me know if further episode for longer than second.  She will also f/u with eye doc   Subjective:   Rachel [name] was seen today in follow up for MSA.  My previous records were reviewed prior to todays visit as well as outside records available to me.  Husband present and supplements history.  They called me about a month ago stating that she was having more tremor and wanted to be seen sooner.  I offered a visit the following day, but when my office staff called back, they were not able to get a hold of the patient.  Today, they state that yesterday was a terrible day but today is a much better day.  She accidentally came here yesterday thinking that the visit was yesterday and it was today.  It was a bad day because "I was shaking all over."   Its difficult to tell if this was dyskinesia or tremor.    Separate, the patient was seen by cardiology in August for heart failure and is doing well with medical therapy.  She also reports a blackening of vision in the R eye x 2-3 seconds twice yesterday.  It wasn't like a curtain.  It was only a second or two.  No lateralizing weakness or paresthesias.   Current prescribed  movement disorder medications:  Carbidopa/levodopa 25/100 CR,  2 tablets at 9 AM, 2 tablets at noon, 2 tablets at 3 PM, 1 tablet at 6 PM and 2 tablets at bedtime   Previous medications: Carbidopa/levodopa 25/100 IR; amantadine (stopped because of memory change/hallucinations); gabapentin (some hallucinations at 300 mg twice daily) ALLERGIES:   Allergies  Allergen Reactions   Amantadine Hcl     Other reaction(s): Hallucinations   Ezetimibe     Other reaction(s): disoriented/confusion   Symbicort [Budesonide-Formoterol Fumarate] Other (See Comments)    Shakes and muscle weakness   Fosamax [Alendronate] Other (See Comments)    headache   Miacalcin [Calcitonin]     Other reaction(s): headache   Azithromycin     Other reaction(s): thrush   Crestor [Rosuvastatin]     Other reaction(s): confusion   Ezetimibe-Simvastatin     Other reaction(s): myalgias    CURRENT MEDICATIONS:  Outpatient Encounter Medications as of 06/12/2023  Medication Sig   albuterol (PROVENTIL) (2.5 MG/3ML) 0.083% nebulizer solution Take 3 mLs (2.5 mg total) by nebulization every 6 (six) hours as needed for wheezing or shortness of breath.   Carbidopa-Levodopa ER (SINEMET CR) 25-100 MG tablet controlled release TAKE 2 TABLETS BY MOUTH AT 9AM;TAKE 2 TABLETS BY MOUTH  AT NOON;TAKE 2 TABLETS BY MOUTH AT 3PM;TAKE 1 TABLET BY MOUTH AT 6PM;TAKE 2 TABLETS BY MOUTH DAILY AT BEDTIME   citalopram (CELEXA) 20 MG tablet Take 20 mg by mouth daily.   ipratropium (ATROVENT) 0.06 % nasal spray SPRAY 2 SPRAYS IN EACH NOSTRIL EVERY 6 HOURS AS NEEDED FOR RUNNY NOSE   levothyroxine (SYNTHROID) 100 MCG tablet Take 100 mcg by mouth daily.   levothyroxine (SYNTHROID) 112 MCG tablet Take 100 mcg by mouth daily.   LINZESS 145 MCG CAPS capsule 1 capsule at least 30 minutes before the first meal of the day on an empty stomach Orally Once a day for 30 days   metoprolol succinate (TOPROL-XL) 25 MG 24 hr tablet TAKE 1/2 TABLET BY MOUTH QD(HOLD IF  BLOOD PRESSURE NUMBER LESS THAN 100 MMHG OR PULSE LESS THAN 60 BPM)   mometasone-formoterol (DULERA) 200-5 MCG/ACT AERO INHALE 2 PUFFS BY MOUTH IN THE MORNING AND AT BEDTIME   montelukast (SINGULAIR) 10 MG tablet TAKE 1 TABLET BY MOUTH AT BEDTIME   ondansetron (ZOFRAN-ODT) 4 MG disintegrating tablet Take 4 mg by mouth every 8 (eight) hours as needed.   pantoprazole (PROTONIX) 40 MG tablet Take 1 tablet (40 mg total) by mouth 2 (two) times daily.   potassium chloride SA (KLOR-CON) 20 MEQ tablet Take 1 tablet by mouth daily at 12 noon.   pravastatin (PRAVACHOL) 40 MG tablet Take 1 tablet (40 mg total) by mouth daily at 6 PM.   revefenacin (YUPELRI) 175 MCG/3ML nebulizer solution Take 3 mLs (175 mcg total) by nebulization daily.   sacubitril-valsartan (ENTRESTO) 24-26 MG Take 1 tablet by mouth 2 (two) times daily.   SPIRIVA RESPIMAT 1.25 MCG/ACT AERS INHALE 2 SPRAY(S) BY MOUTH ONCE DAILY   valACYclovir (VALTREX) 1000 MG tablet Take 1,000 mg by mouth 3 (three) times daily.   VOLTAREN 1 % GEL Apply 1 application  topically 4 (four) times daily as needed (pain).   No facility-administered encounter medications on file as of 06/12/2023.    Objective:   PHYSICAL EXAMINATION:    VITALS:   Vitals:   06/12/23 1505  BP: 118/72  Pulse: 66  SpO2: 98%  Weight: 162 lb 12.8 oz (73.8 kg)     Wt Readings from Last 3 Encounters:  06/12/23 162 lb 12.8 oz (73.8 kg)  03/18/23 153 lb (69.4 kg)  12/04/22 151 lb 12.8 oz (68.9 kg)   GEN:  The patient appears stated age and is in NAD. HEENT:  Normocephalic, atraumatic.  The mucous membranes are moist.   Neurological examination:  Orientation: The patient is alert and oriented x3.  She had no problem relating her history. Cranial nerves: There is good facial symmetry with min facial hypomimia. There is exotropia on the R(with more diplopia).  The speech is fluent and clear. Soft palate rises symmetrically and there is no tongue deviation. Hearing is  intact to conversational tone. Sensation: Sensation is intact to light touch throughout Motor: Strength is at least antigravity x4.    Movement examination: Tone: There is nl tone in the UE/LE Abnormal movements: there is at least mod dyskinesia on the R and in the head and in the axial region Coordination:  There is mild decremation with RAM's Gait and Station: She is a bit unsteady and ataxic (some due to dyskinesia) - she was actually taken to car in [facility] as didn't feel well but BP nl  I have reviewed and interpreted the following labs independently    Chemistry  Component Value Date/Time   NA 140 09/04/2022 1607   NA 139 05/24/2020 1631   K 3.5 09/04/2022 1607   CL 102 09/04/2022 1607   CO2 27 09/04/2022 1607   BUN 15 09/04/2022 1607   BUN 22 05/24/2020 1631   CREATININE 0.82 09/04/2022 1607      Component Value Date/Time   CALCIUM 9.4 09/04/2022 1607   ALKPHOS 58 09/04/2022 1607   AST 15 09/04/2022 1607   ALT <5 09/04/2022 1607   BILITOT 0.6 09/04/2022 1607       Lab Results  Component Value Date   WBC 6.2 09/04/2022   HGB 11.9 (L) 09/04/2022   HCT 36.6 09/04/2022   MCV 92.4 09/04/2022   PLT 347 09/04/2022    Lab Results  Component Value Date   TSH 1.862 02/18/2020   Total time spent on today's visit was 43 minutes, including both face-to-face time and nonface-to-face time.  Time included that spent on review of records (prior notes available to me/labs/imaging if pertinent), discussing treatment and goals, answering patient's questions and coordinating care.   Cc:  Cleatis Polka., MD

## 2023-06-12 ENCOUNTER — Ambulatory Visit: Payer: Medicare Other | Admitting: Neurology

## 2023-06-12 VITALS — BP 118/72 | HR 66 | Wt 162.8 lb

## 2023-06-12 DIAGNOSIS — G238 Other specified degenerative diseases of basal ganglia: Secondary | ICD-10-CM

## 2023-06-12 DIAGNOSIS — G20B1 Parkinson's disease with dyskinesia, without mention of fluctuations: Secondary | ICD-10-CM

## 2023-06-12 DIAGNOSIS — G903 Multi-system degeneration of the autonomic nervous system: Secondary | ICD-10-CM | POA: Diagnosis not present

## 2023-06-12 MED ORDER — RYTARY 48.75-195 MG PO CPCR: ORAL_CAPSULE | ORAL | Status: DC

## 2023-06-12 NOTE — Patient Instructions (Signed)
STOP your current carbidopa/levodopa CR START rytary 195 mg, 1 capsule at 9am, 1pm, 5pm, 9pm.  Call me in a few weeks and let me know how you are doing

## 2023-06-19 ENCOUNTER — Encounter: Payer: Self-pay | Admitting: Internal Medicine

## 2023-06-19 ENCOUNTER — Ambulatory Visit: Payer: Medicare Other | Admitting: Internal Medicine

## 2023-06-19 VITALS — BP 110/66 | HR 80 | Temp 98.0°F | Ht 65.0 in | Wt 160.0 lb

## 2023-06-19 DIAGNOSIS — J4540 Moderate persistent asthma, uncomplicated: Secondary | ICD-10-CM | POA: Diagnosis not present

## 2023-06-19 DIAGNOSIS — K219 Gastro-esophageal reflux disease without esophagitis: Secondary | ICD-10-CM

## 2023-06-19 DIAGNOSIS — J302 Other seasonal allergic rhinitis: Secondary | ICD-10-CM | POA: Diagnosis not present

## 2023-06-19 DIAGNOSIS — J4541 Moderate persistent asthma with (acute) exacerbation: Secondary | ICD-10-CM

## 2023-06-19 MED ORDER — DULERA 200-5 MCG/ACT IN AERO: 2.0000 | INHALATION_SPRAY | Freq: Two times a day (BID) | RESPIRATORY_TRACT | 11 refills | Status: DC

## 2023-06-19 MED ORDER — MONTELUKAST SODIUM 10 MG PO TABS: 10.0000 mg | ORAL_TABLET | Freq: Every day | ORAL | 11 refills | Status: DC

## 2023-06-19 MED ORDER — SPIRIVA RESPIMAT 1.25 MCG/ACT IN AERS: 1.0000 | INHALATION_SPRAY | Freq: Every day | RESPIRATORY_TRACT | 11 refills | Status: DC

## 2023-06-19 MED ORDER — PANTOPRAZOLE SODIUM 40 MG PO TBEC: 40.0000 mg | DELAYED_RELEASE_TABLET | Freq: Two times a day (BID) | ORAL | 3 refills | Status: DC

## 2023-06-19 NOTE — Progress Notes (Signed)
Rachel [name]    [id number]    1951-07-14  Primary Care Physician:Shaw, Netta Corrigan., MD Date of Appointment: 06/19/2023 Established Patient Visit  Chief complaint:   Chief Complaint  Patient presents with   Follow-up    Cough persistent and SOB with exertion.    HPI: Rachel [name] is a 72 y.o. woman with multi-system atrophy who presents for follow up for: asthma, GERD.  Interval Updates: Here for asthma follow up.  She is having worsening MSA symptoms with tremor. Now on levadopa-carbidopa.   Still on spiriva and dulera.  No flares of her asthma. Had some mold exposure in her apartment  and this made her breathing worse but this is now resolved. No prednisone of asthma flares.   She never received yupelri or started it.   Reflux symptoms controlled. Allergies controlled.   Current Regimen: spiriva, Dulera 200 2 puffs twice a day prn albuterol. Montelukast.  Asthma Triggers: anxiety, exertion, seasonal allergies.  Exacerbations in the last year: 0 History of hospitalization or intubation: never Hives: none Allergy Testing: a long time ago, doesn't remember.  GERD: yes, on PPI Allergic Rhinitis: yes on singulair Asthma Control Test:  Asthma Control Test ACT Total Score  03/25/2022  3:28 PM 23  12/20/2021  3:43 PM 14  12/20/2019  9:46 AM 22  FeNO: 10 ppb  I have reviewed the patient's family social and past medical history and updated as appropriate.   Past Medical History:  Diagnosis Date   Allergic rhinitis    Anemia    Arthritis    Asthma    Blood clot in vein  age 72   left leg   Depression    GERD (gastroesophageal reflux disease)    Hyperlipidemia    Hypertension    pt denies, in physician record   Hypothyroidism    Kidney stones    Multiple pregnancy loss, not currently pregnant    multiple miscarriages-pt.unsure of #   Multiple system atrophy (HCC) 2015   Nephrolithiasis    hx of kidney stones, last 6 months ago   Osteopenia     Osteoporosis    Parkinsonism (HCC)    Tendinitis    left foot   Vitamin D deficiency     Past Surgical History:  Procedure Laterality Date   BREAST EXCISIONAL BIOPSY Right    BREAST SURGERY     rt. lumpectomy, benign   colonscopy  2011   cystoscopy  2012   JOINT REPLACEMENT  05-2007   right knee replacment/revision   KIDNEY STONE SURGERY     LEFT HEART CATH AND CORONARY ANGIOGRAPHY N/A 02/21/2020   Procedure: LEFT HEART CATH AND CORONARY ANGIOGRAPHY;  Surgeon: Yates Decamp, MD;  Location: [facility];  Service: Cardiovascular;  Laterality: N/A;   REPLACEMENT TOTAL KNEE Left 06/05/12   TOTAL KNEE REVISION  06/03/2012   Procedure: TOTAL KNEE REVISION;  Surgeon: Loanne Drilling, MD;  Location: [facility];  Service: Orthopedics;  Laterality: Left;  Revision of a Left Uni Knee to a Total Knee Arthroplasty    Family History  Problem Relation Age of Onset   Diabetes Mother    Heart failure Mother        CHF   Thyroid disease Mother    Heart failure Father        CHF   Breast cancer Sister    Asthma Sister    Heart failure Sister    Heart attack Brother  Social History   Occupational History   Occupation: retired/disabled    Employer: [employer]    Comment: receptionist  Tobacco Use   Smoking status: Never   Smokeless tobacco: Never  Vaping Use   Vaping status: Never Used  Substance and Sexual Activity   Alcohol use: No    Alcohol/week: 0.0 standard drinks of alcohol   Drug use: No   Sexual activity: Never    Partners: Male    Birth control/protection: Post-menopausal   Physical Exam: Vitals:   06/19/23 1512  BP: 110/66  Pulse: 80  Temp: 98 F (36.7 C)  SpO2: 95%     Gen: well appearing, ambulating with cane Resp: ctab no wheezes or crackles CV: RRR no mrg Neuro: tremor with involuntary choreiform movements.  Data Reviewed: Imaging: Personally reviewed CTPE study Dec 2019 - negative for PE, +mosaic attentuation and air trapping.   PFTs:      Latest Ref Rng & Units 08/20/2019    3:59 PM  PFT Results  FVC-Pre L 2.35   FVC-Predicted Pre % 71   FVC-Post L 2.32   FVC-Predicted Post % 70   Pre FEV1/FVC % % 67   Post FEV1/FCV % % 68   FEV1-Pre L 1.56   FEV1-Predicted Pre % 62   FEV1-Post L 1.57   DLCO uncorrected ml/min/mmHg 21.15   DLCO UNC% % 101   DLVA Predicted % 132   TLC L 5.87   TLC % Predicted % 109   RV % Predicted % 141    I have personally reviewed the patient's PFTs and they are notable for moderate airflow limitation without a bronchodilator response.  FeNO 10 ppb - not elevated.   Labs: Lab Results  Component Value Date   WBC 6.2 09/04/2022   HGB 11.9 (L) 09/04/2022   HCT 36.6 09/04/2022   MCV 92.4 09/04/2022   PLT 347 09/04/2022    Immunization status: Immunization History  Administered Date(s) Administered   PFIZER(Purple Top)SARS-COV-2 Vaccination 09/19/2019, 10/13/2019   Tdap 04/24/2013   Zoster, Live 10/27/2012   EKG reviewed - Qtc 430 ms   Assessment:  Ms. Yuhas is a 72 y.o.  woman with a history of multiple system atrophy with a resting tremor who presents with: Moderate persistent asthma with improved control.  Gastroesophageal reflux disease controlled Allergic Rhinitis - controlled  Plan/Recommendations: continue dulera 200 mg 2 puffs twice a day. Continue spiriva once daily.  Continue albuterol inhaler as needed.  continue singulair and your reflux medication.    Return to Care: Return in about 1 year (around 06/18/2024).  Durel Salts, MD Pulmonary and Critical Care Medicine [facility] Office:[phone]

## 2023-06-19 NOTE — Patient Instructions (Addendum)
It was a pleasure to see you today!  Please schedule follow up scheduled with myself in 6 months.  If my schedule is not open yet, we will contact you with a reminder closer to that time. Please call [phone] if you haven't heard from [place] a month before, and always call us sooner if issues or concerns arise. You can also send [place] a message through MyChart, but but aware that this is not to be used for urgent issues and it may take up to 5-7 days to receive a reply. Please be aware that you will likely be able to view your results before I have a chance to respond to them. Please give [place] 5 business days to respond to any non-urgent results.   Glad the breathing is doing well.  continue dulera 200 mg 2 puffs twice a day. Continue spiriva once daily.  Continue albuterol inhaler as needed.  continue singulair and your reflux medication.   Call me sooner if you need me.

## 2023-06-30 ENCOUNTER — Telehealth: Payer: Self-pay | Admitting: Neurology

## 2023-06-30 ENCOUNTER — Other Ambulatory Visit: Payer: Self-pay

## 2023-06-30 DIAGNOSIS — G238 Other specified degenerative diseases of basal ganglia: Secondary | ICD-10-CM

## 2023-06-30 DIAGNOSIS — G232 Striatonigral degeneration: Secondary | ICD-10-CM

## 2023-06-30 DIAGNOSIS — R296 Repeated falls: Secondary | ICD-10-CM

## 2023-06-30 MED ORDER — RYTARY 48.75-195 MG PO CPCR: ORAL_CAPSULE | ORAL | 0 refills | Status: DC

## 2023-06-30 NOTE — Telephone Encounter (Signed)
Pt's husband called in stating the pt is doing well on the Rytary. She likes it. She is wanting to know if there was any long term side effects of taking it?  Their pharmacy is [facility]. Stated we will have to tell them to stop giving the old medication so they will pack the medication correctly

## 2023-07-01 ENCOUNTER — Other Ambulatory Visit (HOSPITAL_COMMUNITY): Payer: Self-pay

## 2023-07-01 ENCOUNTER — Telehealth: Payer: Self-pay | Admitting: Pharmacy Technician

## 2023-07-01 NOTE — Telephone Encounter (Signed)
Pharmacy Patient Advocate Encounter   Received notification from Physician's Office that prior authorization for RYTARY 195MG  is required/requested.   Insurance verification completed.   The patient is insured through [facility] .   Per test claim: PA required and submitted KEY/EOC/Request #: [id number] CANCELLED due to   Test billing results with [facility] returns a copay of $341.03. Patient is in there coverage gap (donut hole) and is not eligible for copay assistance due to [facility] plan.

## 2023-07-02 NOTE — Telephone Encounter (Signed)
Do we have any samples to get her through til the new year?

## 2023-07-17 ENCOUNTER — Telehealth: Payer: Self-pay | Admitting: Cardiology

## 2023-07-17 DIAGNOSIS — I5032 Chronic diastolic (congestive) heart failure: Secondary | ICD-10-CM

## 2023-07-17 MED ORDER — SACUBITRIL-VALSARTAN 24-26 MG PO TABS: 1.0000 | ORAL_TABLET | Freq: Two times a day (BID) | ORAL | 2 refills | Status: DC

## 2023-07-17 NOTE — Telephone Encounter (Signed)
*  STAT* If patient is at the pharmacy, call can be transferred to refill team.   1. Which medications need to be refilled? (please list name of each medication and dose if known)  sacubitril-valsartan (ENTRESTO) 24-26 MG    2. Would you like to learn more about the convenience, safety, & potential cost savings by using the [facility]? N/A  3. Are you open to using the [facility] (Type [facility]. N/A   4. Which pharmacy/location (including street and city if local pharmacy) is medication to be sent to? [facility] [address] [place] - [address]    5. Do they need a 30 day or 90 day supply? 90 day   Patient is out of medication.

## 2023-07-17 NOTE — Telephone Encounter (Signed)
  Patient calling the office for samples of medication:   1.  What medication and dosage are you requesting samples for?  sacubitril-valsartan (ENTRESTO) 24-26 MG    2.  Are you currently out of this medication? Yes

## 2023-07-17 NOTE — Telephone Encounter (Signed)
Pt's medication was sent to pt's pharmacy as requested. Confirmation received.  °

## 2023-07-21 MED ORDER — SACUBITRIL-VALSARTAN 24-26 MG PO TABS: 1.0000 | ORAL_TABLET | Freq: Two times a day (BID) | ORAL | Status: DC

## 2023-07-21 NOTE — Telephone Encounter (Signed)
Pt in coverage gap, would like enough Entresto to get to January.   2 week sample provided

## 2023-08-14 ENCOUNTER — Emergency Department (HOSPITAL_BASED_OUTPATIENT_CLINIC_OR_DEPARTMENT_OTHER): Payer: Medicare Other | Admitting: Radiology

## 2023-08-14 ENCOUNTER — Other Ambulatory Visit: Payer: Self-pay

## 2023-08-14 DIAGNOSIS — R1031 Right lower quadrant pain: Secondary | ICD-10-CM | POA: Diagnosis not present

## 2023-08-14 DIAGNOSIS — G20C Parkinsonism, unspecified: Secondary | ICD-10-CM | POA: Diagnosis not present

## 2023-08-14 DIAGNOSIS — R079 Chest pain, unspecified: Secondary | ICD-10-CM | POA: Insufficient documentation

## 2023-08-14 DIAGNOSIS — K5900 Constipation, unspecified: Secondary | ICD-10-CM | POA: Insufficient documentation

## 2023-08-14 DIAGNOSIS — I700 Atherosclerosis of aorta: Secondary | ICD-10-CM | POA: Diagnosis not present

## 2023-08-14 DIAGNOSIS — R0789 Other chest pain: Secondary | ICD-10-CM | POA: Diagnosis not present

## 2023-08-14 LAB — BASIC METABOLIC PANEL
Anion gap: 10 (ref 5–15)
BUN: 22 mg/dL (ref 8–23)
CO2: 26 mmol/L (ref 22–32)
Calcium: 9.2 mg/dL (ref 8.9–10.3)
Chloride: 103 mmol/L (ref 98–111)
Creatinine, Ser: 1.12 mg/dL — ABNORMAL HIGH (ref 0.44–1.00)
GFR, Estimated: 52 mL/min — ABNORMAL LOW (ref >60)
Glucose, Bld: 95 mg/dL (ref 70–99)
Potassium: 4.4 mmol/L (ref 3.5–5.1)
Sodium: 139 mmol/L (ref 135–145)

## 2023-08-14 LAB — CBC
HCT: 38.1 % (ref 36.0–46.0)
Hemoglobin: 12.5 g/dL (ref 12.0–15.0)
MCH: 30.2 pg (ref 26.0–34.0)
MCHC: 32.8 g/dL (ref 30.0–36.0)
MCV: 92 fL (ref 80.0–100.0)
Platelets: 279 10*3/uL (ref 150–400)
RBC: 4.14 MIL/uL (ref 3.87–5.11)
RDW: 12.3 % (ref 11.5–15.5)
WBC: 6 10*3/uL (ref 4.0–10.5)
nRBC: 0 % (ref 0.0–0.2)

## 2023-08-14 LAB — TROPONIN I (HIGH SENSITIVITY): Troponin I (High Sensitivity): 4 ng/L (ref <18)

## 2023-08-14 NOTE — ED Triage Notes (Addendum)
Pt POV from home reporting severe lower abd pain that goes up into chest, began today, hx constipation from multiple system atrophy, states it feels like same.

## 2023-08-15 ENCOUNTER — Emergency Department (HOSPITAL_BASED_OUTPATIENT_CLINIC_OR_DEPARTMENT_OTHER)
Admission: EM | Admit: 2023-08-15 | Discharge: 2023-08-15 | Disposition: A | Discharge status: Home / Self Care | Payer: Medicare Other | Attending: Emergency Medicine | Admitting: Emergency Medicine

## 2023-08-15 ENCOUNTER — Emergency Department (HOSPITAL_BASED_OUTPATIENT_CLINIC_OR_DEPARTMENT_OTHER): Payer: Medicare Other

## 2023-08-15 ENCOUNTER — Encounter (HOSPITAL_BASED_OUTPATIENT_CLINIC_OR_DEPARTMENT_OTHER): Payer: Self-pay | Admitting: Emergency Medicine

## 2023-08-15 DIAGNOSIS — N281 Cyst of kidney, acquired: Secondary | ICD-10-CM | POA: Diagnosis not present

## 2023-08-15 DIAGNOSIS — K5900 Constipation, unspecified: Secondary | ICD-10-CM

## 2023-08-15 DIAGNOSIS — R1030 Lower abdominal pain, unspecified: Secondary | ICD-10-CM | POA: Diagnosis not present

## 2023-08-15 DIAGNOSIS — K5730 Diverticulosis of large intestine without perforation or abscess without bleeding: Secondary | ICD-10-CM | POA: Diagnosis not present

## 2023-08-15 LAB — HEPATIC FUNCTION PANEL
ALT: <5 U/L (ref 0–44)
AST: 15 U/L (ref 15–41)
Albumin: 4.2 g/dL (ref 3.5–5.0)
Alkaline Phosphatase: 82 U/L (ref 38–126)
Bilirubin, Direct: 0.1 mg/dL (ref 0.0–0.2)
Indirect Bilirubin: 0.5 mg/dL (ref 0.3–0.9)
Total Bilirubin: 0.6 mg/dL (ref 0.0–1.2)
Total Protein: 6.5 g/dL (ref 6.5–8.1)

## 2023-08-15 LAB — TROPONIN I (HIGH SENSITIVITY): Troponin I (High Sensitivity): 4 ng/L (ref <18)

## 2023-08-15 LAB — LIPASE, BLOOD: Lipase: 10 U/L — ABNORMAL LOW (ref 11–51)

## 2023-08-15 MED ORDER — MORPHINE SULFATE (PF) 4 MG/ML IV SOLN
4.0000 mg | Freq: Once | INTRAVENOUS | Status: AC
Start: 2023-08-15 — End: 2023-08-15
  Administered 2023-08-15: 4 mg via INTRAVENOUS
  Filled 2023-08-15: qty 1

## 2023-08-15 MED ORDER — IOHEXOL 300 MG/ML  SOLN
100.0000 mL | Freq: Once | INTRAMUSCULAR | Status: AC | PRN
  Administered 2023-08-15: 85 mL via INTRAVENOUS

## 2023-08-15 NOTE — ED Notes (Signed)
Pt reports significant relief following morphine and currently denies any pain.

## 2023-08-15 NOTE — ED Provider Notes (Signed)
[facility] AT [facility]  Provider Note  CSN: [id number] Arrival date & time: 08/14/23 2112  History Chief Complaint  Patient presents with   Abdominal Pain   Chest Pain    Rachel [name] is a 73 y.o. female with history of multiple system atrophy, parkinsons type, managed by neuro on carbadopa/levodopa reports she occasionally gets some constipation or abdominal pains related to same and takes linzess. She reports onset of severe cramping lower abdominal pain a few hours prior to arrival, radiating into chest at times. Not associated with N/V. She had a BM today, does not feel that she has been constipated lately. No urinary symptoms.    Home Medications Prior to Admission medications   Medication Sig Start Date End Date Taking? Authorizing Provider  albuterol (PROVENTIL) (2.5 MG/3ML) 0.083% nebulizer solution Take 3 mLs (2.5 mg total) by nebulization every 6 (six) hours as needed for wheezing or shortness of breath. 02/07/20   Bevelyn Ngo, NP  Carbidopa-Levodopa ER (RYTARY) 48.75-195 MG CPCR Take 1 tablet at 9am,  Take 1 tablet at 1 pm, Take 1 tablet at 5 pm, Take 1 tablet 9 pm 06/30/23   Tat, Octaviano Batty, DO  citalopram (CELEXA) 20 MG tablet Take 20 mg by mouth daily. 12/13/19   [provider]  ipratropium (ATROVENT) 0.06 % nasal spray SPRAY 2 SPRAYS IN EACH NOSTRIL EVERY 6 HOURS AS NEEDED FOR RUNNY NOSE 06/21/20   Kozlow, Alvira Philips, MD  levothyroxine (SYNTHROID) 100 MCG tablet Take 100 mcg by mouth daily. 08/13/22   [provider]  levothyroxine (SYNTHROID) 112 MCG tablet Take 100 mcg by mouth daily. 11/09/19   [provider]  LINZESS 145 MCG CAPS capsule 1 capsule at least 30 minutes before the first meal of the day on an empty stomach Orally Once a day for 30 days    [provider]  metoprolol succinate (TOPROL-XL) 25 MG 24 hr tablet TAKE 1/2 TABLET BY MOUTH QD(HOLD IF BLOOD PRESSURE NUMBER LESS THAN 100 MMHG OR PULSE  LESS THAN 60 BPM) 12/11/22   Tolia, Sunit, DO  mometasone-formoterol (DULERA) 200-5 MCG/ACT AERO Inhale 2 puffs into the lungs in the morning and at bedtime. 06/19/23   Charlott Holler, MD  montelukast (SINGULAIR) 10 MG tablet Take 1 tablet (10 mg total) by mouth at bedtime. 06/19/23   Charlott Holler, MD  ondansetron (ZOFRAN-ODT) 4 MG disintegrating tablet Take 4 mg by mouth every 8 (eight) hours as needed. 09/03/22   [provider]  pantoprazole (PROTONIX) 40 MG tablet Take 1 tablet (40 mg total) by mouth 2 (two) times daily. 06/19/23   Charlott Holler, MD  potassium chloride SA (KLOR-CON) 20 MEQ tablet Take 1 tablet by mouth daily at 12 noon.    [provider]  pravastatin (PRAVACHOL) 40 MG tablet Take 1 tablet (40 mg total) by mouth daily at 6 PM. 02/21/20   Amin, Ankit C, MD  sacubitril-valsartan (ENTRESTO) 24-26 MG Take 1 tablet by mouth 2 (two) times daily. 07/17/23   Tolia, Sunit, DO  sacubitril-valsartan (ENTRESTO) 24-26 MG Take 1 tablet by mouth 2 (two) times daily. 07/21/23   Tolia, Sunit, DO  Tiotropium Bromide Monohydrate (SPIRIVA RESPIMAT) 1.25 MCG/ACT AERS Inhale 1 puff into the lungs daily. 06/19/23   Charlott Holler, MD  valACYclovir (VALTREX) 1000 MG tablet Take 1,000 mg by mouth 3 (three) times daily. 09/03/22   [provider]  VOLTAREN 1 % GEL Apply 1 application  topically  4 (four) times daily as needed (pain). 01/25/14   [provider]     Allergies    Amantadine hcl, Ezetimibe, Symbicort [budesonide-formoterol fumarate], Fosamax [alendronate], Miacalcin [calcitonin], Azithromycin, Crestor [rosuvastatin], and Ezetimibe-simvastatin   Review of Systems   Review of Systems Please see HPI for pertinent positives and negatives  Physical Exam BP (!) 166/91 (BP Location: Left Arm)   Pulse 73   Temp 97.8 F (36.6 C) (Oral)   Resp (!) 22   Ht 5\' 6"  (1.676 m)   Wt 72.6 kg   SpO2 100%   BMI 25.82 kg/m   Physical Exam Vitals and nursing  note reviewed.  Constitutional:      Appearance: Normal appearance.  HENT:     Head: Normocephalic and atraumatic.     Nose: Nose normal.     Mouth/Throat:     Mouth: Mucous membranes are moist.  Eyes:     Extraocular Movements: Extraocular movements intact.     Conjunctiva/sclera: Conjunctivae normal.  Cardiovascular:     Rate and Rhythm: Normal rate.  Pulmonary:     Effort: Pulmonary effort is normal.     Breath sounds: Normal breath sounds.  Abdominal:     General: Abdomen is flat.     Palpations: Abdomen is soft.     Tenderness: There is abdominal tenderness in the right lower quadrant and left lower quadrant. There is guarding. Negative signs include Murphy's sign and McBurney's sign.  Musculoskeletal:        General: No swelling. Normal range of motion.     Cervical back: Neck supple.  Skin:    General: Skin is warm and dry.  Neurological:     General: No focal deficit present.     Mental Status: She is alert.  Psychiatric:        Mood and Affect: Mood normal.     ED Results / Procedures / Treatments   EKG EKG Interpretation Date/Time:  Thursday August 14 2023 21:24:05 EST Ventricular Rate:  87 PR Interval:  130 QRS Duration:  114 QT Interval:  410 QTC Calculation: 493 R Axis:   -10  Text Interpretation: Normal sinus rhythm Artifact Anteroseptal infarct , age undetermined T wave abnormality, consider lateral ischemia Abnormal ECG When compared with ECG of 19-Dec-2021 02:11, PREVIOUS ECG IS PRESENT No significant change since last tracing Confirmed by Susy Frizzle [phone]) on 08/15/2023 1:03:55 AM  Procedures Procedures  Medications Ordered in the ED Medications  morphine (PF) 4 MG/ML injection 4 mg (4 mg Intravenous Given 08/15/23 0117)  iohexol (OMNIPAQUE) 300 MG/ML solution 100 mL (85 mLs Intravenous Contrast Given 08/15/23 0152)    Initial Impression and Plan  Patient here with lower abdominal pain, some guarding, but not focally tender. She had labs  done in triage showing unremarkable CBC, BMP and Trop. Will add LFTs and Lipase. Pain meds for comfort and send for CT to eval abdominal pain. The chest pain does not sound cardiac. I personally viewed the images from radiology studies and agree with radiologist interpretation: CXR is clear.   ED Course   Clinical Course as of 08/15/23 0303  Caleen Essex Aug 15, 2023  0132 Repeat trop remains normal.  [CS]  0217 LFTs and lipase are normal.  [CS]  0302 I personally viewed the images from radiology studies and agree with radiologist interpretation: CT is neg for acute process, does show increased stool in colon which may be causing her symptoms. She reports improvement after morphine. Resting comfortably and ready to  go home. She will continue her linzess and miralax. PCP follow up, RTED for any other concerns.   [CS]    Clinical Course User Index [CS] Pollyann Savoy, MD     MDM Rules/Calculators/A&P Medical Decision Making Given presenting complaint, I considered that admission might be necessary. After review of results from ED lab and/or imaging studies, admission to the hospital is not indicated at this time.    Problems Addressed: Constipation, unspecified constipation type: acute illness or injury  Amount and/or Complexity of Data Reviewed Labs: ordered. Decision-making details documented in ED Course. Radiology: ordered and independent interpretation performed. Decision-making details documented in ED Course. ECG/medicine tests: ordered and independent interpretation performed. Decision-making details documented in ED Course.  Risk Prescription drug management. Parenteral controlled substances. Decision regarding hospitalization.     Final Clinical Impression(s) / ED Diagnoses Final diagnoses:  Constipation, unspecified constipation type    Rx / DC Orders ED Discharge Orders     None        Pollyann Savoy, MD 08/15/23 [phone]

## 2023-08-18 ENCOUNTER — Other Ambulatory Visit: Payer: Self-pay

## 2023-08-18 DIAGNOSIS — I5032 Chronic diastolic (congestive) heart failure: Secondary | ICD-10-CM

## 2023-08-18 MED ORDER — SACUBITRIL-VALSARTAN 24-26 MG PO TABS: 1.0000 | ORAL_TABLET | Freq: Two times a day (BID) | ORAL | 2 refills | Status: DC

## 2023-09-08 ENCOUNTER — Other Ambulatory Visit: Payer: Self-pay | Admitting: Neurology

## 2023-09-08 DIAGNOSIS — G232 Striatonigral degeneration: Secondary | ICD-10-CM

## 2023-09-08 DIAGNOSIS — G903 Multi-system degeneration of the autonomic nervous system: Secondary | ICD-10-CM

## 2023-09-08 DIAGNOSIS — R296 Repeated falls: Secondary | ICD-10-CM

## 2023-09-10 DIAGNOSIS — E039 Hypothyroidism, unspecified: Secondary | ICD-10-CM | POA: Diagnosis not present

## 2023-09-10 DIAGNOSIS — E785 Hyperlipidemia, unspecified: Secondary | ICD-10-CM | POA: Diagnosis not present

## 2023-09-10 DIAGNOSIS — E559 Vitamin D deficiency, unspecified: Secondary | ICD-10-CM | POA: Diagnosis not present

## 2023-09-18 ENCOUNTER — Ambulatory Visit: Payer: Self-pay | Admitting: Cardiology

## 2023-09-22 DIAGNOSIS — I130 Hypertensive heart and chronic kidney disease with heart failure and stage 1 through stage 4 chronic kidney disease, or unspecified chronic kidney disease: Secondary | ICD-10-CM | POA: Diagnosis not present

## 2023-09-22 DIAGNOSIS — G903 Multi-system degeneration of the autonomic nervous system: Secondary | ICD-10-CM | POA: Diagnosis not present

## 2023-09-22 DIAGNOSIS — I10 Essential (primary) hypertension: Secondary | ICD-10-CM | POA: Diagnosis not present

## 2023-09-29 ENCOUNTER — Ambulatory Visit: Payer: Medicare Other | Attending: Cardiology | Admitting: Cardiology

## 2023-09-29 VITALS — BP 120/76 | HR 73 | Resp 16 | Ht 66.0 in | Wt 164.8 lb

## 2023-09-29 DIAGNOSIS — E782 Mixed hyperlipidemia: Secondary | ICD-10-CM

## 2023-09-29 DIAGNOSIS — I5032 Chronic diastolic (congestive) heart failure: Secondary | ICD-10-CM | POA: Diagnosis not present

## 2023-09-29 DIAGNOSIS — Z86718 Personal history of other venous thrombosis and embolism: Secondary | ICD-10-CM

## 2023-09-29 DIAGNOSIS — I130 Hypertensive heart and chronic kidney disease with heart failure and stage 1 through stage 4 chronic kidney disease, or unspecified chronic kidney disease: Secondary | ICD-10-CM | POA: Diagnosis not present

## 2023-09-29 DIAGNOSIS — N183 Chronic kidney disease, stage 3 unspecified: Secondary | ICD-10-CM

## 2023-09-29 DIAGNOSIS — G232 Striatonigral degeneration: Secondary | ICD-10-CM

## 2023-09-29 NOTE — Progress Notes (Signed)
 Cardiology Office Note:  .   ID:  Rachel [name], DOB 1951-04-28, MRN [id number] PCP:  Cleatis Polka., MD  Former Cardiology Providers: None [facility] Providers Cardiologist:  Tessa Lerner, DO , [facility] (established care 03/10/2020) Electrophysiologist:  None  Click to update primary MD,subspecialty MD or APP then REFRESH:1}    Chief Complaint  Patient presents with   Heart failure with improved ejection fraction (HFimpEF)   Follow-up    6 months    History of Present Illness: .   Rachel [name] is a 73 y.o.  female whose past medical history and cardiovascular risk factors includes: Heart failure with improved EF, Chronic HFpEF, multiple system atrophy, asthma, history of left lower extremity DVT, hypertension, hyperlipidemia, hypothyroidism, intermittent left bundle branch block, advanced age, postmenopausal female.   Patient is accompanied by her husband Casimiro Needle at today's office visit.  Patient has history of heart failure with reduced EF with up titration of GDMT to the maximally tolerated doses patient's LVEF is improved as mentioned below.  She presents today for 57-month follow-up visit.  Overall doing well from a cardiovascular standpoint.  Denies anginal chest pain or heart failure symptoms.  Outside labs from February 2025 independently reviewed via [facility] database.  Review of Systems: .   Review of Systems  Constitutional: Negative for chills and fever.  HENT:  Negative for hoarse voice and nosebleeds.   Eyes:  Negative for discharge, double vision and pain.  Cardiovascular:  Negative for chest pain, claudication, dyspnea on exertion, leg swelling, near-syncope, orthopnea, palpitations, paroxysmal nocturnal dyspnea and syncope.  Respiratory:  Positive for shortness of breath (chronic and stable). Negative for hemoptysis.   Musculoskeletal:  Negative for muscle cramps and myalgias.  Gastrointestinal:  Negative for abdominal pain, constipation, diarrhea, hematemesis,  hematochezia, melena, nausea and vomiting.  Neurological:  Positive for dizziness. Negative for light-headedness and numbness.    Studies Reviewed:   EKG: EKG Interpretation Date/Time:  Monday September 29 2023 15:39:12 EST Ventricular Rate:  74 PR Interval:  164 QRS Duration:  122 QT Interval:  468 QTC Calculation: 519 R Axis:   -56  Text Interpretation: Normal sinus rhythm with sinus arrhythmia Left axis deviation Left bundle branch block When compared with ECG of 14-Aug-2023 21:24, Left bundle branch block present on prior EKG from 02/17/2020 Confirmed by Tessa Lerner [phone]) on 09/29/2023 3:44:59 PM  Echocardiogram: 02/17/2020: LVEF 30-35%  03/07/2023: LVEF 59%, normal diastolic function, mild TR.   Stress Testing:  NA   Heart Catheterization: Left Heart Catheterization 02/21/20:  There is severe left ventricular systolic dysfunction.  The left ventricular ejection fraction is less than 25% by visual estimate. LV end diastolic pressure is normal. Normal coronary arteries, right dominant circulation.  RADIOLOGY: NA  Risk Assessment/Calculations:   NA  Labs:       Latest Ref Rng & Units 08/14/2023    9:27 PM 09/04/2022    4:07 PM 07/16/2022    9:23 AM  CBC  WBC 4.0 - 10.5 K/uL 6.0  6.2  5.9   Hemoglobin 12.0 - 15.0 g/dL 19.1  47.8  29.5   Hematocrit 36.0 - 46.0 % 38.1  36.6  34.7   Platelets 150 - 400 K/uL 279  347  265.0        Latest Ref Rng & Units 08/14/2023    9:27 PM 09/04/2022    4:07 PM 07/16/2022    9:23 AM  BMP  Glucose 70 - 99 mg/dL 95  86  621  BUN 8 - 23 mg/dL 22  15  18    Creatinine 0.44 - 1.00 mg/dL 1.61  0.96  0.45   Sodium 135 - 145 mmol/L 139  140  138   Potassium 3.5 - 5.1 mmol/L 4.4  3.5  4.0   Chloride 98 - 111 mmol/L 103  102  102   CO2 22 - 32 mmol/L 26  27  30    Calcium 8.9 - 10.3 mg/dL 9.2  9.4  9.4       Latest Ref Rng & Units 08/15/2023   12:47 AM 08/14/2023    9:27 PM 09/04/2022    4:07 PM  CMP  Glucose 70 - 99 mg/dL  95  86   BUN  8 - 23 mg/dL  22  15   Creatinine 4.09 - 1.00 mg/dL  8.11  9.14   Sodium 782 - 145 mmol/L  139  140   Potassium 3.5 - 5.1 mmol/L  4.4  3.5   Chloride 98 - 111 mmol/L  103  102   CO2 22 - 32 mmol/L  26  27   Calcium 8.9 - 10.3 mg/dL  9.2  9.4   Total Protein 6.5 - 8.1 g/dL 6.5   6.6   Total Bilirubin 0.0 - 1.2 mg/dL 0.6   0.6   Alkaline Phos 38 - 126 U/L 82   58   AST 15 - 41 U/L 15   15   ALT 0 - 44 U/L <5   <5     Lab Results  Component Value Date   CHOL 236 (H) 02/18/2020   HDL 65 02/18/2020   LDLCALC 162 (H) 02/18/2020   TRIG 47 02/18/2020   CHOLHDL 3.6 02/18/2020   No results for input(s): "LIPOA" in the last 8760 hours. No components found for: "NTPROBNP" No results for input(s): "PROBNP" in the last 8760 hours. No results for input(s): "TSH" in the last 8760 hours.   Physical Exam:    Today's Vitals   09/29/23 1542  BP: 120/76  Pulse: 73  Resp: 16  SpO2: 97%  Weight: 164 lb 12.8 oz (74.8 kg)  Height: 5\' 6"  (1.676 m)   Body mass index is 26.6 kg/m. Wt Readings from Last 3 Encounters:  09/29/23 164 lb 12.8 oz (74.8 kg)  08/14/23 160 lb (72.6 kg)  06/19/23 160 lb (72.6 kg)    Physical Exam  Constitutional: No distress. She appears chronically ill.  Age appropriate, hemodynamically stable, presents in a wheelchair,  Neck: No JVD present.  Cardiovascular: Normal rate, regular rhythm, S1 normal, S2 normal, intact distal pulses and normal pulses. Exam reveals no gallop, no S3 and no S4.  No murmur heard. Pulmonary/Chest: Effort normal and breath sounds normal. No stridor. She has no wheezes. She has no rales.  Abdominal: Soft. Bowel sounds are normal. She exhibits no distension. There is no abdominal tenderness.  Musculoskeletal:        General: No edema.     Cervical back: Neck supple.  Neurological: She is alert and oriented to person, place, and time. She has intact cranial nerves (2-12).  Skin: Skin is warm and moist.    Impression & Recommendation(s):   Impression:   ICD-10-CM   1. Heart failure with improved ejection fraction (HFimpEF) (HCC)  I50.32 EKG 12-Lead    2. Chronic heart failure with preserved ejection fraction (HFpEF) (HCC)  I50.32     3. Mixed hyperlipidemia  E78.2     4. Hypertensive heart and kidney disease  with HF and with CKD stage III (HCC)  I13.0    N18.30     5. Multiple system atrophy, 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HCC)  G23.2        Recommendation(s):  Heart failure with improved ejection fraction (HFimpEF) (HCC) Chronic heart failure with preserved ejection fraction (HFpEF) (HCC) Stage C, NYHA class II. July 2021: LVEF 30 to 35%. October 2021: LVEF 50-55%, grade 1 diastolic dysfunction. August 2024: LVEF remains preserved at 59%, see report for additional details Overall doing well from a cardiovascular standpoint. Continue Entresto 24/26 mg p.o. twice daily. Continue Toprol-XL 12.5 mg p.o. daily. Unable to uptitrate GDMT due to episodes of hypotension and orthostasis given her underlying multiple system atrophy/Parkinson variant.  Mixed hyperlipidemia Currently on pravastatin 40 mg p.o. daily.   She denies myalgia or other side effects. Most recent lipids dated every 2025, independently.Marland Kitchen  LDL is 98 mg/dL.  Cardiology is following peripherally.   Hypertensive heart and kidney disease with HF and with CKD stage III (HCC) Office blood pressures are well-controlled. Medications as noted above. Reemphasized importance of low-salt diet.  Multiple system atrophy, 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HCC) Chronic and stable. Currently on medical therapy. Follows with neurology   Orders Placed:  Orders Placed This Encounter  Procedures   EKG 12-Lead    Final Medication List:   No orders of the defined types were placed in this encounter.   Medications Discontinued During This Encounter  Medication Reason   sacubitril-valsartan (ENTRESTO) 24-26 MG Duplicate     Current Outpatient Medications:    albuterol (PROVENTIL)  (2.5 MG/3ML) 0.083% nebulizer solution, Take 3 mLs (2.5 mg total) by nebulization every 6 (six) hours as needed for wheezing or shortness of breath., Disp: 120 mL, Rfl: 11   amoxicillin-clavulanate (AUGMENTIN) 875-125 MG tablet, Take 1 tablet by mouth 2 (two) times daily., Disp: , Rfl:    Carbidopa-Levodopa ER (RYTARY) 48.75-195 MG CPCR, TAKE 1 CAPSULE BY MOUTH ONCE DAILY AT 9AM, TAKE 1 CAPSULE ONCE DAILY AT 1PM, TAKE 1 CAPSULE ONCE DAILY AT 5PM, AND TAKE 1 CAPSULE ONCE DAILY AT 9PM, Disp: 360 capsule, Rfl: 0   citalopram (CELEXA) 20 MG tablet, Take 20 mg by mouth daily., Disp: , Rfl:    ipratropium (ATROVENT) 0.06 % nasal spray, SPRAY 2 SPRAYS IN EACH NOSTRIL EVERY 6 HOURS AS NEEDED FOR RUNNY NOSE, Disp: 45 mL, Rfl: 1   levothyroxine (SYNTHROID) 100 MCG tablet, Take 100 mcg by mouth daily., Disp: , Rfl:    LINZESS 145 MCG CAPS capsule, 1 capsule at least 30 minutes before the first meal of the day on an empty stomach Orally Once a day for 30 days, Disp: , Rfl:    mometasone-formoterol (DULERA) 200-5 MCG/ACT AERO, Inhale 2 puffs into the lungs in the morning and at bedtime., Disp: 13 g, Rfl: 11   montelukast (SINGULAIR) 10 MG tablet, Take 1 tablet (10 mg total) by mouth at bedtime., Disp: 30 tablet, Rfl: 11   ondansetron (ZOFRAN-ODT) 4 MG disintegrating tablet, Take 4 mg by mouth every 8 (eight) hours as needed., Disp: , Rfl:    pantoprazole (PROTONIX) 40 MG tablet, Take 1 tablet (40 mg total) by mouth 2 (two) times daily., Disp: 180 tablet, Rfl: 3   potassium chloride SA (KLOR-CON) 20 MEQ tablet, Take 1 tablet by mouth daily at 12 noon., Disp: , Rfl:    pravastatin (PRAVACHOL) 40 MG tablet, Take 1 tablet (40 mg total) by mouth daily at 6 PM., Disp: 30 tablet, Rfl: 0   sacubitril-valsartan (  ENTRESTO) 24-26 MG, Take 1 tablet by mouth 2 (two) times daily., Disp: , Rfl:    Tiotropium Bromide Monohydrate (SPIRIVA RESPIMAT) 1.25 MCG/ACT AERS, Inhale 1 puff into the lungs daily., Disp: 4 g, Rfl: 11    valACYclovir (VALTREX) 1000 MG tablet, Take 1,000 mg by mouth 3 (three) times daily., Disp: , Rfl:    VOLTAREN 1 % GEL, Apply 1 application  topically 4 (four) times daily as needed (pain)., Disp: , Rfl:    levothyroxine (SYNTHROID) 112 MCG tablet, Take 100 mcg by mouth daily. (Patient not taking: Reported on 09/29/2023), Disp: , Rfl:    metoprolol succinate (TOPROL-XL) 25 MG 24 hr tablet, TAKE 1/2 TABLET BY MOUTH ONCE DAILY (HOLD IF BLOOD PRESSURE NUMBER LESS THAN 100 MMHG OR PULSE LESS THAN 60 BPM), Disp: 90 tablet, Rfl: 1  Consent:   NA  Disposition:   6 month follow up.   Patient may be asked to follow-up sooner based on the results of the above-mentioned testing.  Her questions and concerns were addressed to her satisfaction. She voices understanding of the recommendations provided during this encounter.    Signed, Tessa Lerner, DO, [facility]  [facility]  [address] [place], [place] [address]

## 2023-09-29 NOTE — Patient Instructions (Signed)
 Medication Instructions:  Your physician recommends that you continue on your current medications as directed. Please refer to the Current Medication list given to you today.  *If you need a refill on your cardiac medications before your next appointment, please call your pharmacy*  Lab Work: None ordered today. If you have labs (blood work) drawn today and your tests are completely normal, you will receive your results only by: MyChart Message (if you have MyChart) OR A paper copy in the mail If you have any lab test that is abnormal or we need to change your treatment, we will call you to review the results.  Testing/Procedures: None ordered today.  Follow-Up: At [facility], you and your health needs are our priority.  As part of our continuing mission to provide you with exceptional heart care, we have created designated Provider Care Teams.  These Care Teams include your primary Cardiologist (physician) and Advanced Practice Providers (APPs -  Physician Assistants and Nurse Practitioners) who all work together to provide you with the care you need, when you need it.  We recommend signing up for the patient portal called "MyChart".  Sign up information is provided on this After Visit Summary.  MyChart is used to connect with patients for Virtual Visits (Telemedicine).  Patients are able to view lab/test results, encounter notes, upcoming appointments, etc.  Non-urgent messages can be sent to your provider as well.   To learn more about what you can do with MyChart, go to [email or web address].    Your next appointment:   6 month(s)  The format for your next appointment:   In Person  Provider:   Tessa Lerner, DO {

## 2023-10-03 ENCOUNTER — Other Ambulatory Visit: Payer: Self-pay | Admitting: Cardiology

## 2023-10-03 DIAGNOSIS — I5032 Chronic diastolic (congestive) heart failure: Secondary | ICD-10-CM

## 2023-10-06 DIAGNOSIS — H52203 Unspecified astigmatism, bilateral: Secondary | ICD-10-CM | POA: Diagnosis not present

## 2023-10-08 ENCOUNTER — Encounter: Payer: Self-pay | Admitting: Cardiology

## 2023-10-09 DIAGNOSIS — R058 Other specified cough: Secondary | ICD-10-CM | POA: Diagnosis not present

## 2023-10-09 DIAGNOSIS — J069 Acute upper respiratory infection, unspecified: Secondary | ICD-10-CM | POA: Diagnosis not present

## 2023-10-28 ENCOUNTER — Ambulatory Visit: Payer: Self-pay

## 2023-10-28 DIAGNOSIS — J4541 Moderate persistent asthma with (acute) exacerbation: Secondary | ICD-10-CM

## 2023-10-28 NOTE — Telephone Encounter (Signed)
  Chief Complaint: medication refill Pertinent Negatives: Patient denies acute respiratory distress Additional Notes: Pt husband calling for refill on INH. Upon further investigation, Albuterol INH is needed, not Dulera. Reports that pt had respiratory episode last night that required rescue albuterol, however, there was only 1 puff left, so currently out of rescue medication. Husband claims that pt is currently in active distress so triager asked to speak with pt. Pt denies any current acute sx and triager did not appreciate any audible SOB/wheezing. Pt was able to speak in full sentences. Triager reinforced to call back with any acute sx. Triager also reinforced maintenance respiratory INH usage. Patient verbalized understanding and to call back/go to ED with acute symptoms.    Copied from CRM [id number]. Topic: Clinical - Medical Advice >> Oct 28, 2023  4:39 PM Chantha C wrote: Reason for CRM: Patient's spouse Casimiro Needle [phone] states they are out of town they're in [place] and is asking to send mometasone-formoterol [facility]) 200-5 MCG/ACT AERO to [facility] pharmacy [phone] [address] [place] [address]. Patient medication is out of date and does not have any more. Casimiro Needle states patient has many health issues and is worried if patient has another attack without medication, it'll will be deadly for patient and is asking to please send the medication to the pharmacy today. Please advise and call back. Reason for Disposition  [1] Prescription refill request for ESSENTIAL medicine (i.e., likelihood of harm to patient if not taken) AND [2] triager unable to refill per department policy  Answer Assessment - Initial Assessment Questions 1. DRUG NAME: "What medicine do you need to have refilled?"     albuterol 2. REFILLS REMAINING: "How many refills are remaining?" (Note: The label on the medicine or pill bottle will show how many refills are remaining. If there are no refills remaining, then a  renewal may be needed.)     0 3. EXPIRATION DATE: "What is the expiration date?" (Note: The label states when the prescription will expire, and thus can no longer be refilled.)     unknown 4. PRESCRIBING HCP: "Who prescribed it?" Reason: If prescribed by specialist, call should be referred to that group.     unknown 5. SYMPTOMS: "Do you have any symptoms?"     No acute sx currently  Protocols used: Medication Refill and Renewal Call-A-AH

## 2023-10-29 NOTE — Telephone Encounter (Signed)
 Good morning, Patient has not been seen since 02/2022 and has no scheduled follow up scheduled.  We cannot give refills to patients that have not been seen within the past 12 months unless they have a follow up scheduled.  If she makes an appointment we can give a courtesy refill.  Please call patient back and offer office visit.  If she schedules an appointment then we can refill the inhaler otherwise, they will need to contact their PCP for a refill.  Thank you.

## 2023-10-30 MED ORDER — ALBUTEROL SULFATE HFA 108 (90 BASE) MCG/ACT IN AERS: 2.0000 | INHALATION_SPRAY | Freq: Four times a day (QID) | RESPIRATORY_TRACT | 11 refills | Status: AC | PRN

## 2023-10-30 NOTE — Addendum Note (Signed)
 Addended by: Durel Salts on: 10/30/2023 04:48 PM   Modules accepted: Orders

## 2023-10-30 NOTE — Telephone Encounter (Signed)
 -  Albuterol refilled.

## 2023-10-30 NOTE — Telephone Encounter (Signed)
 Spoke w/ pt she requesting INH  according to med records we have not Rx albuterol INH before just neb sol. PT is out and is not sure who would have Rx       Dr. Celine Mans please be advise

## 2023-11-01 DIAGNOSIS — G20C Parkinsonism, unspecified: Secondary | ICD-10-CM | POA: Diagnosis not present

## 2023-11-01 DIAGNOSIS — K5641 Fecal impaction: Secondary | ICD-10-CM | POA: Diagnosis not present

## 2023-11-01 DIAGNOSIS — I110 Hypertensive heart disease with heart failure: Secondary | ICD-10-CM | POA: Diagnosis not present

## 2023-11-01 DIAGNOSIS — Z79890 Hormone replacement therapy (postmenopausal): Secondary | ICD-10-CM | POA: Diagnosis not present

## 2023-11-01 DIAGNOSIS — E538 Deficiency of other specified B group vitamins: Secondary | ICD-10-CM | POA: Diagnosis not present

## 2023-11-01 DIAGNOSIS — R230 Cyanosis: Secondary | ICD-10-CM | POA: Diagnosis not present

## 2023-11-01 DIAGNOSIS — E785 Hyperlipidemia, unspecified: Secondary | ICD-10-CM | POA: Diagnosis not present

## 2023-11-01 DIAGNOSIS — N390 Urinary tract infection, site not specified: Secondary | ICD-10-CM | POA: Diagnosis not present

## 2023-11-01 DIAGNOSIS — R0602 Shortness of breath: Secondary | ICD-10-CM | POA: Diagnosis not present

## 2023-11-01 DIAGNOSIS — G232 Striatonigral degeneration: Secondary | ICD-10-CM | POA: Diagnosis not present

## 2023-11-01 DIAGNOSIS — J45909 Unspecified asthma, uncomplicated: Secondary | ICD-10-CM | POA: Diagnosis not present

## 2023-11-01 DIAGNOSIS — R0989 Other specified symptoms and signs involving the circulatory and respiratory systems: Secondary | ICD-10-CM | POA: Diagnosis not present

## 2023-11-01 DIAGNOSIS — D649 Anemia, unspecified: Secondary | ICD-10-CM | POA: Diagnosis not present

## 2023-11-01 DIAGNOSIS — R1011 Right upper quadrant pain: Secondary | ICD-10-CM | POA: Diagnosis not present

## 2023-11-01 DIAGNOSIS — B9620 Unspecified Escherichia coli [E. coli] as the cause of diseases classified elsewhere: Secondary | ICD-10-CM | POA: Diagnosis not present

## 2023-11-01 DIAGNOSIS — Z66 Do not resuscitate: Secondary | ICD-10-CM | POA: Diagnosis not present

## 2023-11-01 DIAGNOSIS — K5900 Constipation, unspecified: Secondary | ICD-10-CM | POA: Diagnosis not present

## 2023-11-01 DIAGNOSIS — R7881 Bacteremia: Secondary | ICD-10-CM | POA: Diagnosis not present

## 2023-11-01 DIAGNOSIS — G138 Systemic atrophy primarily affecting central nervous system in other diseases classified elsewhere: Secondary | ICD-10-CM | POA: Diagnosis not present

## 2023-11-01 DIAGNOSIS — F32A Depression, unspecified: Secondary | ICD-10-CM | POA: Diagnosis not present

## 2023-11-01 DIAGNOSIS — I5032 Chronic diastolic (congestive) heart failure: Secondary | ICD-10-CM | POA: Diagnosis not present

## 2023-11-01 DIAGNOSIS — R1084 Generalized abdominal pain: Secondary | ICD-10-CM | POA: Diagnosis not present

## 2023-11-01 DIAGNOSIS — R7989 Other specified abnormal findings of blood chemistry: Secondary | ICD-10-CM | POA: Diagnosis not present

## 2023-11-01 DIAGNOSIS — K838 Other specified diseases of biliary tract: Secondary | ICD-10-CM | POA: Diagnosis not present

## 2023-11-01 DIAGNOSIS — E039 Hypothyroidism, unspecified: Secondary | ICD-10-CM | POA: Diagnosis not present

## 2023-11-01 DIAGNOSIS — A419 Sepsis, unspecified organism: Secondary | ICD-10-CM | POA: Diagnosis not present

## 2023-11-01 DIAGNOSIS — R109 Unspecified abdominal pain: Secondary | ICD-10-CM | POA: Diagnosis not present

## 2023-11-01 DIAGNOSIS — A4159 Other Gram-negative sepsis: Secondary | ICD-10-CM | POA: Diagnosis not present

## 2023-11-01 DIAGNOSIS — Z9989 Dependence on other enabling machines and devices: Secondary | ICD-10-CM | POA: Diagnosis not present

## 2023-11-01 DIAGNOSIS — K219 Gastro-esophageal reflux disease without esophagitis: Secondary | ICD-10-CM | POA: Diagnosis not present

## 2023-11-07 ENCOUNTER — Ambulatory Visit: Payer: Medicare Other | Admitting: Neurology

## 2023-11-17 DIAGNOSIS — N390 Urinary tract infection, site not specified: Secondary | ICD-10-CM | POA: Diagnosis not present

## 2023-11-17 DIAGNOSIS — I129 Hypertensive chronic kidney disease with stage 1 through stage 4 chronic kidney disease, or unspecified chronic kidney disease: Secondary | ICD-10-CM | POA: Diagnosis not present

## 2023-11-17 DIAGNOSIS — K5900 Constipation, unspecified: Secondary | ICD-10-CM | POA: Diagnosis not present

## 2023-12-03 DIAGNOSIS — K080 Exfoliation of teeth due to systemic causes: Secondary | ICD-10-CM | POA: Diagnosis not present

## 2023-12-04 DIAGNOSIS — K080 Exfoliation of teeth due to systemic causes: Secondary | ICD-10-CM | POA: Diagnosis not present

## 2023-12-15 NOTE — Progress Notes (Signed)
 Assessment/Plan:   1.  Parkinsonism  - Continue rytary  195 mg, 1 at 9am, 1pm, 5pm, 9pm.    - She is noticing more tremor, but I do not want to increase the Rytary  because of memory change and hallucinations. 2.  Diplopia  -Saw Dr. Gaylyn Keas years ago at [facility].  Diagnosis was congenital large intermittent exotropia/exophoria.  This is getting worse 3.  Dysphagia  -Last MBE in April, 2023.  No treatment was recommended.  Felt to be mild aspiration risk.  Mechanical soft diet, thin liquids with some regular solids as well recommended. 4.  Low BP  -pt on metoprolol  and entresto .  Metoprolol  has been decreased and she doesn't take it if BP too low.    5.  R vision change  -doesn't sound like amarosis fugax.  Will monitor and let me know if further episode for longer than second.  She will also f/u with eye doc 6.  Hallucinations  - Start Nuplazid, 34 mg daily.  Discussed risk, benefits, side effects, including black box warning.  Patient and husband expressed understanding 7.  Memory change  - Likely early dementia related to #1.  - Patient's husband doing full-time caregiving.  Subjective:   Rachel [name] was seen today in follow up for MSA.  My previous records were reviewed prior to todays visit as well as outside records available to me.  Husband present and supplements history.  Last visit, we stopped her CR levodopa  and changed that out for Rytary , hoping that with help a little bit more with tremor.  She has done well with that medication.  She was in the emergency room in January for constipation.  She was in the hospital in [place]  in April for UTI/urosepsis.  She was there for 3 nights for abx.  She is shaking more lately and her memory is not as good.  She is having some hallucinations of mice crawling on her, birds in the house, etc.  It's mostly in the middle of the night - "something just crawled on me."  That has been prevalent x 1 month and Dr. Bernetta Brilliant checked her urine and she  states that it was normal.  Only other new medication was b12.     Current prescribed movement disorder medications:  rytary  195 mg, 1 at 9am, 1pm, 5pm, 9pm   Previous medications: Carbidopa /levodopa  25/100 IR and CR; amantadine  (stopped because of memory change/hallucinations); gabapentin  (some hallucinations at 300 mg twice daily)  ALLERGIES:   Allergies  Allergen Reactions   Amantadine  Hcl     Other reaction(s): Hallucinations   Ezetimibe     Other reaction(s): disoriented/confusion   Symbicort [Budesonide -Formoterol  Fumarate] Other (See Comments)    Shakes and muscle weakness   Fosamax [Alendronate] Other (See Comments)    headache   Miacalcin [Calcitonin]     Other reaction(s): headache   Azithromycin      Other reaction(s): thrush   Crestor [Rosuvastatin]     Other reaction(s): confusion   Ezetimibe-Simvastatin     Other reaction(s): myalgias    CURRENT MEDICATIONS:  Outpatient Encounter Medications as of 12/16/2023  Medication Sig   albuterol  (PROVENTIL ) (2.5 MG/3ML) 0.083% nebulizer solution Take 3 mLs (2.5 mg total) by nebulization every 6 (six) hours as needed for wheezing or shortness of breath.   albuterol  (VENTOLIN  HFA) 108 (90 Base) MCG/ACT inhaler Inhale 2 puffs into the lungs every 6 (six) hours as needed for wheezing or shortness of breath.   amoxicillin-clavulanate (AUGMENTIN) 875-125 MG tablet Take  1 tablet by mouth 2 (two) times daily.   Carbidopa -Levodopa  ER (RYTARY ) 48.75-195 MG CPCR TAKE 1 CAPSULE BY MOUTH ONCE DAILY AT 9AM, TAKE 1 CAPSULE ONCE DAILY AT 1PM, TAKE 1 CAPSULE ONCE DAILY AT 5PM, AND TAKE 1 CAPSULE ONCE DAILY AT 9PM   citalopram  (CELEXA ) 20 MG tablet Take 20 mg by mouth daily.   cyanocobalamin (VITAMIN B12) 1000 MCG tablet Take 1,000 mcg by mouth daily.   ipratropium (ATROVENT ) 0.06 % nasal spray SPRAY 2 SPRAYS IN EACH NOSTRIL EVERY 6 HOURS AS NEEDED FOR RUNNY NOSE   LINZESS  145 MCG CAPS capsule 1 capsule at least 30 minutes before the first  meal of the day on an empty stomach Orally Once a day for 30 days   metoprolol  succinate (TOPROL -XL) 25 MG 24 hr tablet TAKE 1/2 TABLET BY MOUTH ONCE DAILY (HOLD IF BLOOD PRESSURE NUMBER LESS THAN 100 MMHG OR PULSE LESS THAN 60 BPM)   mometasone -formoterol  (DULERA ) 200-5 MCG/ACT AERO Inhale 2 puffs into the lungs in the morning and at bedtime.   montelukast  (SINGULAIR ) 10 MG tablet Take 1 tablet (10 mg total) by mouth at bedtime.   ondansetron  (ZOFRAN -ODT) 4 MG disintegrating tablet Take 4 mg by mouth every 8 (eight) hours as needed.   pantoprazole  (PROTONIX ) 40 MG tablet Take 1 tablet (40 mg total) by mouth 2 (two) times daily.   potassium chloride  SA (KLOR-CON ) 20 MEQ tablet Take 1 tablet by mouth daily at 12 noon.   pravastatin  (PRAVACHOL ) 40 MG tablet Take 1 tablet (40 mg total) by mouth daily at 6 PM.   sacubitril -valsartan  (ENTRESTO ) 24-26 MG Take 1 tablet by mouth 2 (two) times daily.   Tiotropium Bromide Monohydrate  (SPIRIVA  RESPIMAT) 1.25 MCG/ACT AERS Inhale 1 puff into the lungs daily.   valACYclovir (VALTREX) 1000 MG tablet Take 1,000 mg by mouth 3 (three) times daily.   VOLTAREN 1 % GEL Apply 1 application  topically 4 (four) times daily as needed (pain).   levothyroxine  (SYNTHROID ) 100 MCG tablet Take 100 mcg by mouth daily. (Patient not taking: Reported on 12/16/2023)   levothyroxine  (SYNTHROID ) 112 MCG tablet Take 100 mcg by mouth daily. (Patient not taking: Reported on 09/29/2023)   No facility-administered encounter medications on file as of 12/16/2023.    Objective:   PHYSICAL EXAMINATION:    VITALS:   Vitals:   12/16/23 1543  BP: 124/86  Pulse: 65  SpO2: 96%  Weight: 162 lb 12.8 oz (73.8 kg)      Wt Readings from Last 3 Encounters:  12/16/23 162 lb 12.8 oz (73.8 kg)  09/29/23 164 lb 12.8 oz (74.8 kg)  08/14/23 160 lb (72.6 kg)   GEN:  The patient appears stated age and is in NAD. HEENT:  Normocephalic, atraumatic.  The mucous membranes are moist.   Neurological  examination:  Orientation: The patient is alert and oriented x3.  She had no problem relating her history. Cranial nerves: There is good facial symmetry with min facial hypomimia. There is exotropia on the R(with more diplopia).  The speech is fluent and clear. Soft palate rises symmetrically and there is no tongue deviation. Hearing is intact to conversational tone. Sensation: Sensation is intact to light touch throughout Motor: Strength is at least antigravity x4.    Movement examination: Tone: There is nl tone in the UE/LE Abnormal movements: There is no dyskinesia today.  She does have tremor in both arms and legs today, overall mild. Coordination:  There is mild decremation with RAM's Gait and  Station: Not tested formally today  I have reviewed and interpreted the following labs independently    Chemistry      Component Value Date/Time   NA 139 08/14/2023 2127   NA 139 05/24/2020 1631   K 4.4 08/14/2023 2127   CL 103 08/14/2023 2127   CO2 26 08/14/2023 2127   BUN 22 08/14/2023 2127   BUN 22 05/24/2020 1631   CREATININE 1.12 (H) 08/14/2023 2127      Component Value Date/Time   CALCIUM 9.2 08/14/2023 2127   ALKPHOS 82 08/15/2023 0047   AST 15 08/15/2023 0047   ALT <5 08/15/2023 0047   BILITOT 0.6 08/15/2023 0047       Lab Results  Component Value Date   WBC 6.0 08/14/2023   HGB 12.5 08/14/2023   HCT 38.1 08/14/2023   MCV 92.0 08/14/2023   PLT 279 08/14/2023    Lab Results  Component Value Date   TSH 1.862 02/18/2020   Total time spent on today's visit was 30 minutes, including both face-to-face time and nonface-to-face time.  Time included that spent on review of records (prior notes available to me/labs/imaging if pertinent), discussing treatment and goals, answering patient's questions and coordinating care.   Cc:  Jeannine Milroy., MD

## 2023-12-16 ENCOUNTER — Ambulatory Visit: Admitting: Neurology

## 2023-12-16 ENCOUNTER — Encounter: Payer: Self-pay | Admitting: Neurology

## 2023-12-16 VITALS — BP 124/86 | HR 65 | Wt 162.8 lb

## 2023-12-16 DIAGNOSIS — G20B1 Parkinson's disease with dyskinesia, without mention of fluctuations: Secondary | ICD-10-CM | POA: Diagnosis not present

## 2023-12-16 DIAGNOSIS — R441 Visual hallucinations: Secondary | ICD-10-CM | POA: Diagnosis not present

## 2023-12-16 DIAGNOSIS — G232 Striatonigral degeneration: Secondary | ICD-10-CM | POA: Diagnosis not present

## 2023-12-16 NOTE — Patient Instructions (Signed)
 Nuplazid take 1 pill every day

## 2023-12-19 ENCOUNTER — Telehealth: Payer: Self-pay | Admitting: Neurology

## 2023-12-19 NOTE — Telephone Encounter (Signed)
 Refaxed to [facility]

## 2023-12-19 NOTE — Telephone Encounter (Signed)
 Rep states Rx treatment form was incomplete in section 2, please complete and fax back [phone]-fax

## 2023-12-30 ENCOUNTER — Other Ambulatory Visit: Payer: Self-pay

## 2023-12-30 DIAGNOSIS — R441 Visual hallucinations: Secondary | ICD-10-CM

## 2023-12-30 MED ORDER — NUPLAZID 34 MG PO CAPS: 34.0000 mg | ORAL_CAPSULE | Freq: Every day | ORAL | 1 refills | Status: AC

## 2023-12-31 ENCOUNTER — Telehealth: Payer: Self-pay | Admitting: Neurology

## 2023-12-31 NOTE — Telephone Encounter (Signed)
 Rachel [name] from [facility] called in offering to help if needed with getting the Nuplazid prior authorization.

## 2024-01-02 ENCOUNTER — Other Ambulatory Visit (HOSPITAL_COMMUNITY): Payer: Self-pay

## 2024-01-05 ENCOUNTER — Telehealth: Payer: Self-pay

## 2024-01-05 ENCOUNTER — Other Ambulatory Visit (HOSPITAL_COMMUNITY): Payer: Self-pay

## 2024-01-05 NOTE — Telephone Encounter (Signed)
 Pharmacy Patient Advocate Encounter   Received notification from Pt Calls Messages that prior authorization for Nuplazid  34MG  capsules is required/requested.   Insurance verification completed.   The patient is insured through [employer] .   Per test claim: PA required; PA submitted to above mentioned insurance via CoverMyMeds Key/confirmation #/EOC [id number] Status is pending

## 2024-01-05 NOTE — Telephone Encounter (Signed)
 PA request has been Submitted. New Encounter has been or will be created for follow up. For additional info see Pharmacy Prior Auth telephone encounter from 01/05/2024.

## 2024-01-08 ENCOUNTER — Other Ambulatory Visit (HOSPITAL_COMMUNITY): Payer: Self-pay

## 2024-01-08 ENCOUNTER — Telehealth: Payer: Self-pay | Admitting: Neurology

## 2024-01-08 NOTE — Telephone Encounter (Signed)
 Pharmacy called and LM with AN. Rachel [name] is calling to check on a PA. Did not leave the medication. [phone]

## 2024-01-09 NOTE — Telephone Encounter (Signed)
 Pharmacy Patient Advocate Encounter  Received notification from [facility] that Prior Authorization for Nuplazid  34MG  capsules has been CANCELLED due to previous submission was denied.   We received this request for authorization of coverage for a medication. However, this request has already been reviewed, and the medication was denied for coverage on 01/02/2024, which is 4 days ago. A new coverage determination cannot be requested until day 66 following the previous denial decision; you also have the option to appeal the decision (Fax# [phone]) -- please see the attached denial letter with appeal rights and appeal form. Please contact us  at [phone] if you have any additional questions concerning the INITIAL decision only.  I'm not sure who initiated the original request but it was denied for not receiving clinical information that was requested from the office. We did not receive any faxes on our end asking for additional information.   PA #/Case ID/Reference #: [id number]

## 2024-01-13 DIAGNOSIS — K080 Exfoliation of teeth due to systemic causes: Secondary | ICD-10-CM | POA: Diagnosis not present

## 2024-01-14 ENCOUNTER — Ambulatory Visit: Admitting: Neurology

## 2024-01-15 ENCOUNTER — Telehealth: Payer: Self-pay | Admitting: Neurology

## 2024-01-15 NOTE — Telephone Encounter (Signed)
 Called pateint her husband is still asleep and he handles all her health decisions as well as her medications. She said she would have him call me as soon as he got up

## 2024-01-15 NOTE — Telephone Encounter (Signed)
 Insurance says has to try seroquel first before nuplazid .  Give RX for seroquel, 25 mg, 1/2 twice per day.  #30.  RF 0.  Tell them to call us  10 days prior to running out and letting us  know how she does

## 2024-01-22 ENCOUNTER — Other Ambulatory Visit: Payer: Self-pay

## 2024-01-22 DIAGNOSIS — R440 Auditory hallucinations: Secondary | ICD-10-CM

## 2024-01-22 DIAGNOSIS — R441 Visual hallucinations: Secondary | ICD-10-CM

## 2024-01-22 MED ORDER — QUETIAPINE FUMARATE 25 MG PO TABS: ORAL_TABLET | ORAL | 0 refills | Status: DC

## 2024-01-22 NOTE — Telephone Encounter (Signed)
 Called and spoke to patient and husband and they agreed to trial the Seroquel I have sent in Dose to [facility] the most inexpensive I could find. They will call when they get low

## 2024-01-22 NOTE — Telephone Encounter (Signed)
 If pt is still going to take Nuplazid , they need an appeal sent in. Appeal phone number [phone]

## 2024-01-22 NOTE — Telephone Encounter (Signed)
 Called patient back and left voicemail

## 2024-02-04 ENCOUNTER — Other Ambulatory Visit (HOSPITAL_COMMUNITY): Payer: Self-pay

## 2024-02-04 ENCOUNTER — Telehealth: Payer: Self-pay

## 2024-02-04 NOTE — Telephone Encounter (Signed)
 Pharmacy Patient Advocate Encounter   Received notification from CoverMyMeds that prior authorization for QUEtiapine  Fumarate 25MG  tablets is required/requested.   Insurance verification completed.   The patient is insured through [employer] .   Per test claim: PA required; PA submitted to above mentioned insurance via CoverMyMeds Key/confirmation #/EOC [id number] Status is pending

## 2024-02-05 ENCOUNTER — Other Ambulatory Visit (HOSPITAL_COMMUNITY): Payer: Self-pay

## 2024-02-05 NOTE — Telephone Encounter (Signed)
 Pharmacy Patient Advocate Encounter  Received notification from [facility] that Prior Authorization for QUETIAPINE  25MG  has been APPROVED from 7.9.25 to 7.9.26. Ran test claim, Copay is $0. This test claim was processed through [facility]- copay amounts may vary at other pharmacies due to pharmacy/plan contracts, or as the patient moves through the different stages of their insurance plan.   PA #/Case ID/Reference #: [id number]

## 2024-02-15 ENCOUNTER — Other Ambulatory Visit: Payer: Self-pay | Admitting: Neurology

## 2024-02-15 DIAGNOSIS — R441 Visual hallucinations: Secondary | ICD-10-CM

## 2024-02-15 DIAGNOSIS — R440 Auditory hallucinations: Secondary | ICD-10-CM

## 2024-03-01 ENCOUNTER — Other Ambulatory Visit: Payer: Self-pay | Admitting: Neurology

## 2024-03-01 ENCOUNTER — Telehealth: Payer: Self-pay | Admitting: Neurology

## 2024-03-01 DIAGNOSIS — R296 Repeated falls: Secondary | ICD-10-CM

## 2024-03-01 DIAGNOSIS — G903 Multi-system degeneration of the autonomic nervous system: Secondary | ICD-10-CM

## 2024-03-01 DIAGNOSIS — G232 Striatonigral degeneration: Secondary | ICD-10-CM

## 2024-03-01 NOTE — Telephone Encounter (Signed)
 Left a message with the after hour service on  02-27-24 at 5:43 pm   Caller states that patient needs a refill on medication and prior auth is needed.   They did not get the name of medication

## 2024-03-04 NOTE — Telephone Encounter (Signed)
 I reached out 2 different times to patients husband michael and did not receive a call back until today. Patient has tried Seroquel  for 10 days and is still having hallucinations and would like us  to try getting approval for the patient to start back on Nuplazid . Patients and her husband saw a bid difference between the two drugs

## 2024-03-04 NOTE — Telephone Encounter (Signed)
 Left a message with the after hour service on 03-03-24   Caller states that the Dr Advise his wife to try a medication recommenced by the ins for 10 days instead of taking the Nuplazid   he states that she is still having hallucinations  the new medication is not working

## 2024-03-04 NOTE — Telephone Encounter (Signed)
 Left a message with the after hour service on 03-03-24    Caller states that they called about the prior auth for the medication needed   Nuplazid  34 mg

## 2024-03-05 ENCOUNTER — Telehealth: Payer: Self-pay | Admitting: Neurology

## 2024-03-05 NOTE — Telephone Encounter (Signed)
 Sent in Miranda form

## 2024-03-05 NOTE — Telephone Encounter (Signed)
 Katie with [facility]  at [phone] called about pt. Rachel [name] stated that they  sent a form to our office for new Nuplazid  And they want to know , when will they receive that  form back. Pt is also out of medicine. Thanks

## 2024-03-05 NOTE — Telephone Encounter (Signed)
 Called [facility] and explained situation about needing new notes for Seroquel  trial

## 2024-03-08 NOTE — Telephone Encounter (Signed)
 Left a message with the after hour service on  03-05-24   Caller states that she would need a prior auth for the patient   She has called several times in regards to prior shara Iona ORN  [phone]  [facility]

## 2024-03-09 ENCOUNTER — Other Ambulatory Visit (HOSPITAL_COMMUNITY): Payer: Self-pay

## 2024-03-09 ENCOUNTER — Telehealth: Payer: Self-pay | Admitting: Pharmacy Technician

## 2024-03-09 DIAGNOSIS — I130 Hypertensive heart and chronic kidney disease with heart failure and stage 1 through stage 4 chronic kidney disease, or unspecified chronic kidney disease: Secondary | ICD-10-CM | POA: Diagnosis not present

## 2024-03-09 DIAGNOSIS — E039 Hypothyroidism, unspecified: Secondary | ICD-10-CM | POA: Diagnosis not present

## 2024-03-09 NOTE — Telephone Encounter (Signed)
 PA has been submitted, and telephone encounter has been created. Please see telephone encounter dated 8.12.25.

## 2024-03-09 NOTE — Telephone Encounter (Signed)
 Pharmacy Patient Advocate Encounter   Received notification from Pt Calls Messages that prior authorization for NUPLAZID  34MG  is required/requested.   Insurance verification completed.   The patient is insured through [facility] .   Per test claim: PA required; PA submitted to above mentioned insurance via Latent Key/confirmation #/EOC [id number] Status is pending

## 2024-03-11 NOTE — Telephone Encounter (Signed)
 Pharmacy Patient Advocate Encounter  Received notification from [facility] Medicare that Prior Authorization for  NUPLAZID  34MG  has been APPROVED from 03-09-2024 to 03-09-2025   PA #/Case ID/Reference #: [id number]

## 2024-03-11 NOTE — Telephone Encounter (Signed)
 Called [facility] and gave auth number and valid dates I also have faxed the Auth to [facility] for there records as well

## 2024-03-11 NOTE — Telephone Encounter (Signed)
 Left a message with the after hour service on 03-11-24   Caller states that she is needing to speak to someone about the prior auth for the nuplazid    [phone]

## 2024-03-12 ENCOUNTER — Telehealth: Payer: Self-pay | Admitting: Neurology

## 2024-03-12 NOTE — Telephone Encounter (Signed)
 Left a VM stating that patient is having some side effects on the Nuplazid    Please call

## 2024-03-15 NOTE — Telephone Encounter (Signed)
 Patient called, Nuplazid  caused side effects trouble swallowing, patient could sit still. Wants to know what next steps will be. Patient only took two doses, hasn't used anymore since Friday, Today is feeling better.

## 2024-03-15 NOTE — Telephone Encounter (Signed)
 I advised to Hold Nuplazid  for one week and resume per Dr.Rebecca Tat, patient will call with an update and then will decide on MBE. Thank you

## 2024-04-02 ENCOUNTER — Other Ambulatory Visit: Payer: Self-pay | Admitting: Cardiology

## 2024-04-02 MED ORDER — SACUBITRIL-VALSARTAN 24-26 MG PO TABS: 1.0000 | ORAL_TABLET | Freq: Two times a day (BID) | ORAL | 1 refills | Status: AC

## 2024-04-05 DIAGNOSIS — K080 Exfoliation of teeth due to systemic causes: Secondary | ICD-10-CM | POA: Diagnosis not present

## 2024-04-19 ENCOUNTER — Other Ambulatory Visit (HOSPITAL_BASED_OUTPATIENT_CLINIC_OR_DEPARTMENT_OTHER): Payer: Self-pay | Admitting: Adult Health

## 2024-04-19 ENCOUNTER — Ambulatory Visit (HOSPITAL_BASED_OUTPATIENT_CLINIC_OR_DEPARTMENT_OTHER)
Admission: RE | Admit: 2024-04-19 | Discharge: 2024-04-19 | Disposition: A | Discharge status: Home / Self Care | Source: Ambulatory Visit | Attending: Adult Health | Admitting: Adult Health

## 2024-04-19 DIAGNOSIS — R310 Gross hematuria: Secondary | ICD-10-CM

## 2024-04-19 DIAGNOSIS — R1031 Right lower quadrant pain: Secondary | ICD-10-CM

## 2024-04-19 DIAGNOSIS — R1032 Left lower quadrant pain: Secondary | ICD-10-CM | POA: Diagnosis not present

## 2024-04-19 DIAGNOSIS — R102 Pelvic and perineal pain: Secondary | ICD-10-CM | POA: Diagnosis not present

## 2024-05-17 DIAGNOSIS — N390 Urinary tract infection, site not specified: Secondary | ICD-10-CM | POA: Diagnosis not present

## 2024-05-17 DIAGNOSIS — R1030 Lower abdominal pain, unspecified: Secondary | ICD-10-CM | POA: Diagnosis not present

## 2024-05-21 ENCOUNTER — Other Ambulatory Visit: Payer: Self-pay | Admitting: Internal Medicine

## 2024-05-21 DIAGNOSIS — J4541 Moderate persistent asthma with (acute) exacerbation: Secondary | ICD-10-CM

## 2024-05-21 DIAGNOSIS — J4540 Moderate persistent asthma, uncomplicated: Secondary | ICD-10-CM

## 2024-06-25 ENCOUNTER — Other Ambulatory Visit: Payer: Self-pay | Admitting: Neurology

## 2024-06-25 DIAGNOSIS — R441 Visual hallucinations: Secondary | ICD-10-CM

## 2024-07-07 NOTE — Progress Notes (Signed)
 Assessment/Plan:   1.  Parkinsonism  - Continue rytary  195 mg, 1 at 9am, 1pm, 5pm, 9pm.    - She is noticing more tremor, but I do not want to increase the Rytary  because of memory change and hallucinations.  She did ask me what it would be like without the Rytary , and I told her she could hold it for a day if she would like to.  It sounds like she is missing several dosages anyway.  I do think her cramping would increase, as she has complained about that symptom in the past. 2.  Diplopia  -Saw Dr. Gladis years ago at [facility].  Diagnosis was congenital large intermittent exotropia/exophoria.  This is getting worse 3.  Dysphagia  -Last MBE in April, 2023.  No treatment was recommended.  Felt to be mild aspiration risk.  Mechanical soft diet, thin liquids with some regular solids as well recommended. 4.  Low BP  -pt on metoprolol  and entresto .  Metoprolol  has been decreased and she doesn't take it if BP too low.    5.  R vision change  -doesn't sound like amarosis fugax.  Will monitor and let me know if further episode for longer than second.  She will also f/u with eye doc 6.  Hallucinations  - Continue Nuplazid , 34 mg daily.  Discussed cost issues and they will let us  know if they have issues at the beginning of the year.  The Nuplazid  seems to be helping, although symptoms are not completely gone. 7.  Memory change  - Likely dementia related to #1.  - Patient's husband doing full-time caregiving.  Subjective:   Rachel [name] was seen today in follow up for MSA.  My previous records were reviewed prior to todays visit as well as outside records available to me.  Husband present and supplements history.  Nuplazid  restarted last visit for hallucinations.   Insurance initially denied the nuplazid  and had us  start seroquel .  They have stopped taking that.  They did trial the nuplazid  samples.  They did call me about some swallowing troubles not long after they started the Nuplazid .  I told  them that I thought it was related to the disease and not necessarily the Nuplazid .  I did tell them that we may need to repeat her swallowing evaluation in the future.  She did get approved for the nuplazid  after she had trialed the seroquel  but [facility] couldn't get initially get ahold of them to get consent to send the medication. They report that she has been on it now for 2 months but they do wonder what the cost will be the beginning of the year.  Hallucinations are better but not completely gone.     Current prescribed movement disorder medications:  rytary  195 mg, 1 at 9am, 1pm, 5pm, 9pm Nuplazid , 34 mg daily (started last visit)  Previous medications: Carbidopa /levodopa  25/100 IR and CR; amantadine  (stopped because of memory change/hallucinations); gabapentin  (some hallucinations at 300 mg twice daily)  ALLERGIES:   Allergies  Allergen Reactions   Amantadine  Hcl     Other reaction(s): Hallucinations   Ezetimibe     Other reaction(s): disoriented/confusion   Symbicort [Budesonide -Formoterol  Fumarate] Other (See Comments)    Shakes and muscle weakness   Fosamax [Alendronate] Other (See Comments)    headache   Miacalcin [Calcitonin]     Other reaction(s): headache   Azithromycin      Other reaction(s): thrush   Crestor [Rosuvastatin]     Other reaction(s): confusion  Ezetimibe-Simvastatin     Other reaction(s): myalgias    CURRENT MEDICATIONS:  Outpatient Encounter Medications as of 07/08/2024  Medication Sig   albuterol  (PROVENTIL ) (2.5 MG/3ML) 0.083% nebulizer solution Take 3 mLs (2.5 mg total) by nebulization every 6 (six) hours as needed for wheezing or shortness of breath.   albuterol  (VENTOLIN  HFA) 108 (90 Base) MCG/ACT inhaler Inhale 2 puffs into the lungs every 6 (six) hours as needed for wheezing or shortness of breath.   amoxicillin-clavulanate (AUGMENTIN) 875-125 MG tablet Take 1 tablet by mouth 2 (two) times daily.   Carbidopa -Levodopa  ER (RYTARY ) 48.75-195 MG  CPCR TAKE 1 CAPSULE BY MOUTH ONCE DAILY AT 9AM, TAKE 1 CAPSULE ONCE DAILY AT 1PM, TAKE 1 CAPSULE ONCE DAILY AT 5PM, AND TAKE 1 CAPSULE ONCE DAILY AT 9PM   citalopram  (CELEXA ) 20 MG tablet Take 20 mg by mouth daily.   cyanocobalamin (VITAMIN B12) 1000 MCG tablet Take 1,000 mcg by mouth daily.   ipratropium (ATROVENT ) 0.06 % nasal spray SPRAY 2 SPRAYS IN EACH NOSTRIL EVERY 6 HOURS AS NEEDED FOR RUNNY NOSE   LINZESS  145 MCG CAPS capsule 1 capsule at least 30 minutes before the first meal of the day on an empty stomach Orally Once a day for 30 days   metoprolol  succinate (TOPROL -XL) 25 MG 24 hr tablet TAKE 1/2 TABLET BY MOUTH ONCE DAILY (HOLD IF BLOOD PRESSURE NUMBER LESS THAN 100 MMHG OR PULSE LESS THAN 60 BPM)   mometasone -formoterol  (DULERA ) 200-5 MCG/ACT AERO Inhale 2 puffs into the lungs in the morning and at bedtime.   montelukast  (SINGULAIR ) 10 MG tablet TAKE 1 TABLET BY MOUTH DAILY AT BEDTIME   ondansetron  (ZOFRAN -ODT) 4 MG disintegrating tablet Take 4 mg by mouth every 8 (eight) hours as needed.   pantoprazole  (PROTONIX ) 40 MG tablet Take 1 tablet (40 mg total) by mouth 2 (two) times daily.   Pimavanserin Tartrate  (NUPLAZID ) 34 MG CAPS Take 1 capsule (34 mg total) by mouth daily.   potassium chloride  SA (KLOR-CON ) 20 MEQ tablet Take 1 tablet by mouth daily at 12 noon.   pravastatin  (PRAVACHOL ) 40 MG tablet Take 1 tablet (40 mg total) by mouth daily at 6 PM.   sacubitril -valsartan  (ENTRESTO ) 24-26 MG Take 1 tablet by mouth 2 (two) times daily.   SPIRIVA  RESPIMAT 1.25 MCG/ACT AERS INHALE 1 PUFF INTO THE LUNGS ONCE DAILY   valACYclovir (VALTREX) 1000 MG tablet Take 1,000 mg by mouth 3 (three) times daily.   VOLTAREN 1 % GEL Apply 1 application  topically 4 (four) times daily as needed (pain).   [DISCONTINUED] levothyroxine  (SYNTHROID ) 100 MCG tablet Take 100 mcg by mouth daily. (Patient not taking: Reported on 12/16/2023)   [DISCONTINUED] levothyroxine  (SYNTHROID ) 112 MCG tablet Take 100 mcg by  mouth daily. (Patient not taking: Reported on 09/29/2023)   [DISCONTINUED] QUEtiapine  (SEROQUEL ) 25 MG tablet 1/2 twice per day.   No facility-administered encounter medications on file as of 07/08/2024.    Objective:   PHYSICAL EXAMINATION:    VITALS:   Vitals:   07/08/24 1526  BP: 136/80  Pulse: 68  SpO2: 99%  Weight: 162 lb (73.5 kg)       Wt Readings from Last 3 Encounters:  07/08/24 162 lb (73.5 kg)  12/16/23 162 lb 12.8 oz (73.8 kg)  09/29/23 164 lb 12.8 oz (74.8 kg)   GEN:  The patient appears stated age and is in NAD. HEENT:  Normocephalic, atraumatic.  The mucous membranes are moist.  Cardiovascular: Regular rate rhythm Lungs: Clear  to auscultation bilaterally  Neurological examination:  Orientation: The patient is alert and oriented x3.  She had no problem relating her history. Cranial nerves: There is good facial symmetry with min facial hypomimia. There is exotropia on the R(with more diplopia).  The speech is fluent and clear. Soft palate rises symmetrically and there is no tongue deviation. Hearing is intact to conversational tone. Sensation: Sensation is intact to light touch throughout Motor: Strength is at least antigravity x4.    Movement examination: Tone: There is nl tone in the UE/LE Abnormal movements: She does have tremor in both arms and legs today, R>L, mild to mod Coordination:  There is mild decremation with RAM's Gait and Station: pushes off to arise.  She is flexed at the waist and is short stepped with bilateral UE rest tremor.    I have reviewed and interpreted the following labs independently    Chemistry      Component Value Date/Time   NA 139 08/14/2023 2127   NA 139 05/24/2020 1631   K 4.4 08/14/2023 2127   CL 103 08/14/2023 2127   CO2 26 08/14/2023 2127   BUN 22 08/14/2023 2127   BUN 22 05/24/2020 1631   CREATININE 1.12 (H) 08/14/2023 2127      Component Value Date/Time   CALCIUM 9.2 08/14/2023 2127   ALKPHOS 82 08/15/2023  0047   AST 15 08/15/2023 0047   ALT <5 08/15/2023 0047   BILITOT 0.6 08/15/2023 0047       Lab Results  Component Value Date   WBC 6.0 08/14/2023   HGB 12.5 08/14/2023   HCT 38.1 08/14/2023   MCV 92.0 08/14/2023   PLT 279 08/14/2023    Lab Results  Component Value Date   TSH 1.862 02/18/2020   Total time spent on today's visit was 30 minutes, including both face-to-face time and nonface-to-face time.  Time included that spent on review of records (prior notes available to me/labs/imaging if pertinent), discussing treatment and goals, answering patient's questions and coordinating care.   Cc:  Loreli Elsie JONETTA Mickey., MD

## 2024-07-08 ENCOUNTER — Ambulatory Visit: Admitting: Neurology

## 2024-07-08 ENCOUNTER — Encounter: Payer: Self-pay | Admitting: Neurology

## 2024-07-08 VITALS — BP 136/80 | HR 68 | Wt 162.0 lb

## 2024-07-08 DIAGNOSIS — G232 Striatonigral degeneration: Secondary | ICD-10-CM

## 2024-07-08 DIAGNOSIS — R441 Visual hallucinations: Secondary | ICD-10-CM

## 2024-07-08 NOTE — Patient Instructions (Signed)
°  VISIT SUMMARY: Today, we discussed your ongoing issues with hallucinations, tremors, speech difficulties, and constipation. We reviewed your current medications and addressed concerns about their effectiveness and side effects. We also talked about ways to improve medication adherence and manage your symptoms better.  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 We will continue your current medication, Rytary , and consider using an alarmed pill box to help you remember to take your doses. We will also monitor your cramping and constipation, and you may stop Rytary  for one day to see how it affects your symptoms.  -HALLUCINATIONS IN MULTIPLE SYSTEM ATROPHY: Hallucinations are false perceptions of objects or events. Despite taking Nuplazid , you are still experiencing occasional hallucinations. We will continue with Nuplazid  as prescribed and monitor your condition.  -CONSTIPATION IN MULTIPLE SYSTEM ATROPHY: Constipation is a condition where you have difficulty emptying your bowels. It is contributing to your cramping. We will monitor your bowel habits and cramping to manage this issue better.  INSTRUCTIONS: Please continue taking your medications as prescribed. Consider using an alarmed pill box to help with medication adherence. Monitor your bowel habits and cramping, and let us  know if there are any changes. You may stop Rytary  for one day to see how it affects your symptoms. Follow up with us  if you experience any new or worsening symptoms.                      Contains text generated by Abridge.                                 Contains text generated by Abridge.

## 2024-07-19 ENCOUNTER — Ambulatory Visit: Admitting: Internal Medicine

## 2024-07-19 DIAGNOSIS — K080 Exfoliation of teeth due to systemic causes: Secondary | ICD-10-CM | POA: Diagnosis not present

## 2024-08-17 ENCOUNTER — Ambulatory Visit: Admitting: Primary Care

## 2024-08-17 ENCOUNTER — Encounter: Payer: Self-pay | Admitting: Primary Care

## 2024-08-17 DIAGNOSIS — K219 Gastro-esophageal reflux disease without esophagitis: Secondary | ICD-10-CM

## 2024-08-17 DIAGNOSIS — J4540 Moderate persistent asthma, uncomplicated: Secondary | ICD-10-CM | POA: Diagnosis not present

## 2024-08-17 DIAGNOSIS — J4541 Moderate persistent asthma with (acute) exacerbation: Secondary | ICD-10-CM

## 2024-08-17 MED ORDER — DULERA 200-5 MCG/ACT IN AERO: 2.0000 | INHALATION_SPRAY | Freq: Two times a day (BID) | RESPIRATORY_TRACT | 11 refills | Status: AC

## 2024-08-17 MED ORDER — PANTOPRAZOLE SODIUM 40 MG PO TBEC: 40.0000 mg | DELAYED_RELEASE_TABLET | Freq: Two times a day (BID) | ORAL | 3 refills | Status: AC

## 2024-08-17 MED ORDER — SPIRIVA RESPIMAT 1.25 MCG/ACT IN AERS: 2.0000 | INHALATION_SPRAY | Freq: Every day | RESPIRATORY_TRACT | 11 refills | Status: AC

## 2024-08-17 MED ORDER — MONTELUKAST SODIUM 10 MG PO TABS: 10.0000 mg | ORAL_TABLET | Freq: Every day | ORAL | 11 refills | Status: AC

## 2024-08-17 NOTE — Patient Instructions (Signed)
" °  VISIT SUMMARY: Today, we reviewed your asthma management and discussed your gastroesophageal reflux disease (GERD). Your asthma appears to be well-controlled with your current medications, and we have refilled your prescriptions. We also discussed an alternative treatment for your GERD and provided samples for you to try.  YOUR PLAN: -MODERATE PERSISTENT ASTHMA: Moderate persistent asthma is a condition where the airways in your lungs are inflamed and can cause symptoms like shortness of breath and coughing. Your asthma is well-controlled with your current medications, Dulera  and Spiriva  inhalers, and montelukast . We have refilled your Dulera  and Spiriva  inhalers to continue your treatment.  -GASTROESOPHAGEAL REFLUX DISEASE (GERD): GERD is a condition where stomach acid frequently flows back into the tube connecting your mouth and stomach, causing heartburn. You are currently managing this with pantoprazole  twice daily. We discussed an alternative treatment called Gourmet Reflux, a seaweed-based supplement that acts as a barrier to prevent reflux, and provided samples for you to try.  INSTRUCTIONS: Please continue using your Dulera  and Spiriva  inhalers as prescribed. Try the Gourmet Reflux samples for your GERD and let us  know if you find them helpful. Follow up with us  if you experience any changes in your symptoms or have any concerns.  Follow-up 6 months with Dr. Pleas or Dr. Adrien - new patient visit (asthma- former Dr. Meade)     "

## 2024-08-17 NOTE — Progress Notes (Signed)
 "  @Patient  ID: Rachel [name], female    DOB: 01/31/1951, 74 y.o.   MRN: [id number]  No chief complaint on file.   Referring provider: Loreli Elsie JONETTA Mickey., MD  HPI: 74 year old female, never smoked. PMH significant for moderate persistent asthma. Former Dr. Meade patient, last seen 06/19/23.   08/17/2024 Discussed the use of AI scribe software for clinical note transcription with the patient, who gave verbal consent to proceed.  History of Present Illness Rachel [name] is a 74 year old female with moderate persistent asthma who presents for follow-up of her asthma management. She is accompanied by Ozell, her family member. She was previously seen by Dr. Meade for asthma management.  She uses Dulera , 200 mcg, two puffs in the morning and evening, Spiriva  Respimat 1.25mcg and Singulair  for asthma management. She experiences shortness of breath a couple of times a week and uses her rescue inhaler two to four times a week, but not daily. Asthma has not woken her up at night in the past month. She occasionally experiences coughing fits, often triggered by swallowing, for which she uses her rescue inhaler with good effect. No recent viral respiratory infections requiring antibiotics or prednisone  in the last three months. She has had prednisone  use in the past, but the reason is not recalled. ACT score 18.   She has a history of multiple system atrophy and follows with a neurologist. She is on levodopa  (Rytary ) for this condition. She has been managing this condition for approximately 15 years. She reports some balance issues but is able to walk without a cane for short distances.  She experiences daily heartburn and is on pantoprazole  twice a day for reflux management.  Allergies[1]  Immunization History  Administered Date(s) Administered   PFIZER(Purple Top)SARS-COV-2 Vaccination 09/19/2019, 10/13/2019   Tdap 04/24/2013   Zoster, Live 10/27/2012    Past Medical History:  Diagnosis Date    Allergic rhinitis    Anemia    Arthritis    Asthma    Blood clot in vein  age 76   left leg   Depression    GERD (gastroesophageal reflux disease)    Hyperlipidemia    Hypertension    pt denies, in physician record   Hypothyroidism    Kidney stones    Multiple pregnancy loss, not currently pregnant    multiple miscarriages-pt.unsure of #   Multiple system atrophy (HCC) 2015   Nephrolithiasis    hx of kidney stones, last 6 months ago   Osteopenia    Osteoporosis    Parkinsonism (HCC)    Tendinitis    left foot   Vitamin D deficiency     Tobacco History: Tobacco Use History[2] Counseling given: Not Answered   Outpatient Medications Prior to Visit  Medication Sig Dispense Refill   albuterol  (PROVENTIL ) (2.5 MG/3ML) 0.083% nebulizer solution Take 3 mLs (2.5 mg total) by nebulization every 6 (six) hours as needed for wheezing or shortness of breath. 120 mL 11   albuterol  (VENTOLIN  HFA) 108 (90 Base) MCG/ACT inhaler Inhale 2 puffs into the lungs every 6 (six) hours as needed for wheezing or shortness of breath. 18 g 11   amoxicillin-clavulanate (AUGMENTIN) 875-125 MG tablet Take 1 tablet by mouth 2 (two) times daily.     Carbidopa -Levodopa  ER (RYTARY ) 48.75-195 MG CPCR TAKE 1 CAPSULE BY MOUTH ONCE DAILY AT 9AM, TAKE 1 CAPSULE ONCE DAILY AT 1PM, TAKE 1 CAPSULE ONCE DAILY AT 5PM, AND TAKE 1 CAPSULE ONCE DAILY AT 9PM 360  capsule 0   citalopram  (CELEXA ) 20 MG tablet Take 20 mg by mouth daily.     cyanocobalamin (VITAMIN B12) 1000 MCG tablet Take 1,000 mcg by mouth daily.     ipratropium (ATROVENT ) 0.06 % nasal spray SPRAY 2 SPRAYS IN EACH NOSTRIL EVERY 6 HOURS AS NEEDED FOR RUNNY NOSE 45 mL 1   LINZESS  145 MCG CAPS capsule 1 capsule at least 30 minutes before the first meal of the day on an empty stomach Orally Once a day for 30 days     metoprolol  succinate (TOPROL -XL) 25 MG 24 hr tablet TAKE 1/2 TABLET BY MOUTH ONCE DAILY (HOLD IF BLOOD PRESSURE NUMBER LESS THAN 100 MMHG OR PULSE  LESS THAN 60 BPM) 90 tablet 1   mometasone -formoterol  (DULERA ) 200-5 MCG/ACT AERO Inhale 2 puffs into the lungs in the morning and at bedtime. 13 g 11   montelukast  (SINGULAIR ) 10 MG tablet TAKE 1 TABLET BY MOUTH DAILY AT BEDTIME 30 tablet 8   ondansetron  (ZOFRAN -ODT) 4 MG disintegrating tablet Take 4 mg by mouth every 8 (eight) hours as needed.     pantoprazole  (PROTONIX ) 40 MG tablet Take 1 tablet (40 mg total) by mouth 2 (two) times daily. 180 tablet 3   Pimavanserin Tartrate  (NUPLAZID ) 34 MG CAPS Take 1 capsule (34 mg total) by mouth daily. 60 capsule 1   potassium chloride  SA (KLOR-CON ) 20 MEQ tablet Take 1 tablet by mouth daily at 12 noon.     pravastatin  (PRAVACHOL ) 40 MG tablet Take 1 tablet (40 mg total) by mouth daily at 6 PM. 30 tablet 0   sacubitril -valsartan  (ENTRESTO ) 24-26 MG Take 1 tablet by mouth 2 (two) times daily. 180 tablet 1   SPIRIVA  RESPIMAT 1.25 MCG/ACT AERS INHALE 1 PUFF INTO THE LUNGS ONCE DAILY 4 g 8   valACYclovir (VALTREX) 1000 MG tablet Take 1,000 mg by mouth 3 (three) times daily.     VOLTAREN 1 % GEL Apply 1 application  topically 4 (four) times daily as needed (pain).     No facility-administered medications prior to visit.   Review of Systems  Review of Systems  Respiratory: Negative.     Physical Exam  There were no vitals taken for this visit. Physical Exam Constitutional:      Appearance: Normal appearance. She is well-developed.  HENT:     Head: Normocephalic and atraumatic.     Mouth/Throat:     Mouth: Mucous membranes are moist.     Pharynx: Oropharynx is clear.  Eyes:     Pupils: Pupils are equal, round, and reactive to light.  Cardiovascular:     Rate and Rhythm: Normal rate and regular rhythm.     Heart sounds: Normal heart sounds. No murmur heard. Pulmonary:     Effort: Pulmonary effort is normal. No respiratory distress.     Breath sounds: Normal breath sounds. No wheezing or rhonchi.  Musculoskeletal:        General: Normal range  of motion.     Cervical back: Normal range of motion and neck supple.  Skin:    General: Skin is warm and dry.     Findings: No erythema or rash.  Neurological:     General: No focal deficit present.     Mental Status: She is alert and oriented to person, place, and time. Mental status is at baseline.  Psychiatric:        Mood and Affect: Mood normal.        Behavior: Behavior normal.  Thought Content: Thought content normal.        Judgment: Judgment normal.     Lab Results:  CBC    Component Value Date/Time   WBC 6.0 08/14/2023 2127   RBC 4.14 08/14/2023 2127   HGB 12.5 08/14/2023 2127   HGB 12.4 04/11/2020 1604   HCT 38.1 08/14/2023 2127   HCT 37.3 04/11/2020 1604   PLT 279 08/14/2023 2127   MCV 92.0 08/14/2023 2127   MCV 91 04/11/2020 1604   MCH 30.2 08/14/2023 2127   MCHC 32.8 08/14/2023 2127   RDW 12.3 08/14/2023 2127   RDW 13.6 04/11/2020 1604   LYMPHSABS 1.0 09/04/2022 1607   LYMPHSABS 1.2 04/11/2020 1604   MONOABS 0.8 09/04/2022 1607   EOSABS 0.1 09/04/2022 1607   EOSABS 0.2 04/11/2020 1604   BASOSABS 0.0 09/04/2022 1607   BASOSABS 0.0 04/11/2020 1604    BMET    Component Value Date/Time   NA 139 08/14/2023 2127   NA 139 05/24/2020 1631   K 4.4 08/14/2023 2127   CL 103 08/14/2023 2127   CO2 26 08/14/2023 2127   GLUCOSE 95 08/14/2023 2127   BUN 22 08/14/2023 2127   BUN 22 05/24/2020 1631   CREATININE 1.12 (H) 08/14/2023 2127   CALCIUM 9.2 08/14/2023 2127   GFRNONAA 52 (L) 08/14/2023 2127   GFRAA 45 (L) 05/24/2020 1631    BNP    Component Value Date/Time   BNP 376.0 (H) 02/20/2020 1307    ProBNP    Component Value Date/Time   PROBNP 3,084 (H) 05/24/2020 1633    Imaging: No results found.   Assessment & Plan:   1. Moderate persistent asthma with acute exacerbation - mometasone -formoterol  (DULERA ) 200-5 MCG/ACT AERO; Inhale 2 puffs into the lungs in the morning and at bedtime.  Dispense: 13 g; Refill: 11 - montelukast   (SINGULAIR ) 10 MG tablet; Take 1 tablet (10 mg total) by mouth at bedtime.  Dispense: 30 tablet; Refill: 11  2. Moderate persistent asthma without complication - Tiotropium Bromide (SPIRIVA  RESPIMAT) 1.25 MCG/ACT AERS; Take 2 puffs by mouth daily at 6 (six) AM.  Dispense: 4 g; Refill: 11  Assessment and Plan Assessment & Plan Moderate persistent asthma Asthma is well-controlled with current medication regimen. ACT score 18. Reports shortness of breath 3-6 times a week and uses rescue inhaler 2-4 times a week. No recent viral respiratory infections requiring prednisone  or antibiotics. Continues on Dulera , Spiriva , and montelukast . - Refilled Dulera  inhaler 200mcg two puffs q 12 hours  - Refilled Spiriva  inhaler 1.62mcg two puffs daily   Gastroesophageal reflux disease Chronic heartburn managed with pantoprazole  twice daily. Discussed alternative treatment with Gourmet Reflux, a seaweed-based supplement that acts as a barrier to prevent reflux. Provided samples for trial use. - Refilled pantoprazole  40mg  twice daily  - Provided samples of Gourmet Reflux for trial use  Almarie LELON Ferrari, NP 08/17/2024     [1]  Allergies Allergen Reactions   Amantadine  Hcl     Other reaction(s): Hallucinations   Ezetimibe     Other reaction(s): disoriented/confusion   Symbicort [Budesonide -Formoterol  Fumarate] Other (See Comments)    Shakes and muscle weakness   Fosamax [Alendronate] Other (See Comments)    headache   Miacalcin [Calcitonin]     Other reaction(s): headache   Azithromycin      Other reaction(s): thrush   Crestor [Rosuvastatin]     Other reaction(s): confusion   Ezetimibe-Simvastatin     Other reaction(s): myalgias  [2]  Social History Tobacco Use  Smoking  Status Never  Smokeless Tobacco Never   "

## 2024-08-18 ENCOUNTER — Other Ambulatory Visit: Payer: Self-pay | Admitting: Neurology

## 2024-08-18 DIAGNOSIS — R296 Repeated falls: Secondary | ICD-10-CM

## 2024-08-18 DIAGNOSIS — G232 Striatonigral degeneration: Secondary | ICD-10-CM

## 2024-08-18 DIAGNOSIS — G903 Multi-system degeneration of the autonomic nervous system: Secondary | ICD-10-CM

## 2025-01-18 ENCOUNTER — Ambulatory Visit: Payer: Self-pay | Admitting: Neurology
